# Patient Record
Sex: Male | Born: 1935
Health system: Southern US, Community
[De-identification: ages and names within clinical notes are randomized; demographics above are authoritative.]

## PROBLEM LIST (undated history)

## (undated) DIAGNOSIS — H409 Unspecified glaucoma: Secondary | ICD-10-CM

## (undated) DIAGNOSIS — K219 Gastro-esophageal reflux disease without esophagitis: Secondary | ICD-10-CM

## (undated) DIAGNOSIS — E039 Hypothyroidism, unspecified: Secondary | ICD-10-CM

## (undated) DIAGNOSIS — Z8719 Personal history of other diseases of the digestive system: Secondary | ICD-10-CM

## (undated) DIAGNOSIS — K210 Gastro-esophageal reflux disease with esophagitis: Secondary | ICD-10-CM

## (undated) DIAGNOSIS — C189 Malignant neoplasm of colon, unspecified: Secondary | ICD-10-CM

## (undated) DIAGNOSIS — F32A Depression, unspecified: Secondary | ICD-10-CM

## (undated) DIAGNOSIS — IMO0001 Reserved for inherently not codable concepts without codable children: Secondary | ICD-10-CM

## (undated) DIAGNOSIS — H9190 Unspecified hearing loss, unspecified ear: Secondary | ICD-10-CM

## (undated) DIAGNOSIS — I480 Paroxysmal atrial fibrillation: Secondary | ICD-10-CM

## (undated) DIAGNOSIS — E785 Hyperlipidemia, unspecified: Secondary | ICD-10-CM

## (undated) DIAGNOSIS — F329 Major depressive disorder, single episode, unspecified: Secondary | ICD-10-CM

## (undated) DIAGNOSIS — C801 Malignant (primary) neoplasm, unspecified: Secondary | ICD-10-CM

## (undated) DIAGNOSIS — C61 Malignant neoplasm of prostate: Secondary | ICD-10-CM

## (undated) DIAGNOSIS — F419 Anxiety disorder, unspecified: Secondary | ICD-10-CM

## (undated) DIAGNOSIS — R9439 Abnormal result of other cardiovascular function study: Secondary | ICD-10-CM

## (undated) DIAGNOSIS — I10 Essential (primary) hypertension: Secondary | ICD-10-CM

## (undated) DIAGNOSIS — I2510 Atherosclerotic heart disease of native coronary artery without angina pectoris: Secondary | ICD-10-CM

## (undated) DIAGNOSIS — D510 Vitamin B12 deficiency anemia due to intrinsic factor deficiency: Secondary | ICD-10-CM

## (undated) HISTORY — PX: PARTIAL COLECTOMY: SHX5273

## (undated) HISTORY — PX: COLON SURGERY: SHX602

## (undated) HISTORY — DX: Hyperlipidemia, unspecified: E78.5

## (undated) HISTORY — DX: Unspecified hearing loss, unspecified ear: H91.90

## (undated) HISTORY — DX: Malignant neoplasm of prostate: C61

## (undated) HISTORY — DX: Unspecified glaucoma: H40.9

## (undated) HISTORY — PX: CATARACT EXTRACTION: SUR2

## (undated) HISTORY — DX: Reserved for inherently not codable concepts without codable children: IMO0001

## (undated) HISTORY — PX: COLONOSCOPY: SHX174

## (undated) HISTORY — DX: Vitamin B12 deficiency anemia due to intrinsic factor deficiency: D51.0

## (undated) HISTORY — PX: EYE SURGERY: SHX253

## (undated) HISTORY — DX: Paroxysmal atrial fibrillation: I48.0

## (undated) HISTORY — DX: Gastro-esophageal reflux disease without esophagitis: K21.9

## (undated) HISTORY — PX: TONSILLECTOMY: SUR1361

## (undated) HISTORY — DX: Gastro-esophageal reflux disease with esophagitis: K21.0

## (undated) HISTORY — DX: Hypothyroidism, unspecified: E03.9

---

## 1989-05-22 HISTORY — PX: HERNIA REPAIR: SHX51

## 2005-10-01 ENCOUNTER — Emergency Department: Payer: Self-pay | Admitting: Emergency Medicine

## 2005-11-09 ENCOUNTER — Ambulatory Visit: Payer: Self-pay | Admitting: Gastroenterology

## 2007-05-23 HISTORY — PX: FOOT SURGERY: SHX648

## 2008-02-03 ENCOUNTER — Encounter: Admission: RE | Admit: 2008-02-03 | Discharge: 2008-02-03 | Payer: Self-pay | Admitting: Orthopedic Surgery

## 2008-02-04 ENCOUNTER — Encounter (INDEPENDENT_AMBULATORY_CARE_PROVIDER_SITE_OTHER): Payer: Self-pay | Admitting: Orthopedic Surgery

## 2008-02-04 ENCOUNTER — Ambulatory Visit (HOSPITAL_BASED_OUTPATIENT_CLINIC_OR_DEPARTMENT_OTHER): Admission: RE | Admit: 2008-02-04 | Discharge: 2008-02-04 | Payer: Self-pay | Admitting: Orthopedic Surgery

## 2008-02-10 ENCOUNTER — Ambulatory Visit: Payer: Self-pay | Admitting: Internal Medicine

## 2009-03-09 ENCOUNTER — Ambulatory Visit: Payer: Self-pay | Admitting: Gastroenterology

## 2010-10-04 NOTE — Op Note (Signed)
Jacob Reyes, Jacob Reyes              ACCOUNT NO.:  [id number]   MEDICAL RECORD NO.:  [id number]          PATIENT TYPE:  AMB   LOCATION:  DSC                          FACILITY:  [facility]   PHYSICIAN:  Leonides Grills, M.D.     DATE OF BIRTH:  26-Aug-1925   DATE OF PROCEDURE:  02/04/2008  DATE OF DISCHARGE:                               OPERATIVE REPORT   PREOPERATIVE DIAGNOSES:  1. Right tight gastrocnemius.  2. Right second metatarsalgia, secondary to Morton foot.  3. Right second hammertoe.   POSTOPERATIVE DIAGNOSES:  1. Right tight gastrocnemius.  2. Right second metatarsalgia, secondary to Morton's foot.  3. Right second hammertoe.   OPERATION:  1. Right gastrocnemius slide  2. Right second Weil metatarsal-shortening osteotomy.  3. Amputation, right second toe through metatarsophalangeal joint.   ANESTHESIA:  General.   SURGEON:  Leonides Grills, MD   ASSISTANT:  Richardean Canal, PA-C   ESTIMATED BLOOD LOSS:  Minimal.   TOURNIQUET TIME:  Approximately half an hour.   COMPLICATIONS:  None.   DISPOSITION:  Stable to PR.   INDICATIONS:  This is an 77 year old male who has had prolonged right  forefoot pain that was interfering with his life to the point what he  wants to  with a crossover second hammertoe deformity with an advanced  hallux valgus deformity.  Hallux valgus was not bothering him, but the  crossover second hammertoe was rubbing in his shoe and causing him  problems.  He also had a tight gastroc.  He was consented for the above  procedure.  All risks including infection or vessel injury, nonunion,  malunion, hardware irritation, hardware failure, persistent pain,  worsening pain, and prolonged recovery were all explained and questions  were encouraged and answered.   OPERATION:  The patient was brought to the operating room and placed in  supine position after adequate general endotracheal anesthesia was  administered as well as Ancef 1 g IV piggyback.  Right lower  extremity  was then prepped and draped in sterile manner.  Over proximally placed a  thigh tourniquet, limbus gravity was exsanguinated, it was elevated to  290 mmHg.  A longitudinal incision was made over the medial aspect of  gastrocnemius muscle tendinous junction was then made.  Dissection was  carried down through the skin.  Hemostasis was obtained.  Fascia was  opened in line with the incision.  Conjoined region was then developed  between the gastroc soleus.  Soft tissue was then elevated off the  posterior aspect of the gastrocnemius.  Sural nerve was identified and  exposed serially throughout the case.  Gastrocnemius was then released  with a curved Mayo scissors.  This had an excellent release of tight  gastroc.  The area was copiously irrigated with normal saline.  Subcu  was closed with 3-0 Vicryl and skin was closed 4-0 nylon, then a racquet-  shaped incision based dorsally was then made over at the base of the  right second toe.  Dissection was carried directly to bone.  The toe was  then carefully dissected out and disarticulated at the MTP joint.  Hemostasis was obtained.  We then using a stack and sagittal saw blade,  performed a Weil metatarsal-shortening osteotomy parallel to plantar  aspect of the foot.  This was then translated approximately 4 to 5-mm  proximally and then fixed with a 1.5-mm fully-threaded cortical set  screw using a 1.1-mm drill hole respectively.  This had an excellent  purchase and maintenance of the reduction.  We were able to visualize  underneath the metatarsal head and the screw did not penetrate bone  plantarly.  We then obtained x-rays of AP and lateral planes, which  showed no gross motion fixation, proposition, and excellent as well.  The tourniquet deflated and hemostasis was obtained.  There was no  pulsatile bleeding.  Subcu was closed with 3-0 Vicryl and skin was  closed with 4-0 nylon.  Sterile dressing was applied.  Cam walker boot   was applied.  The patient was stable to PR.      Leonides Grills, M.D.  Electronically Signed     PB/MEDQ  D:  02/04/2008  T:  02/05/2008  Job:  [id number]

## 2011-02-22 LAB — BASIC METABOLIC PANEL
CO2: 26
Calcium: 8.8
Creatinine, Ser: 0.82
GFR calc Af Amer: >60
GFR calc non Af Amer: >60
Glucose, Bld: 118 — ABNORMAL HIGH

## 2012-03-12 ENCOUNTER — Ambulatory Visit: Payer: Self-pay | Admitting: Gastroenterology

## 2013-07-10 ENCOUNTER — Encounter: Payer: Self-pay | Admitting: Podiatrist

## 2013-07-10 ENCOUNTER — Ambulatory Visit (INDEPENDENT_AMBULATORY_CARE_PROVIDER_SITE_OTHER): Payer: Medicare Other | Admitting: Podiatrist

## 2013-07-10 VITALS — BP 144/96 | HR 97 | Resp 18 | Ht 73.0 in | Wt 187.0 lb

## 2013-07-10 DIAGNOSIS — Q828 Other specified congenital malformations of skin: Secondary | ICD-10-CM

## 2013-07-10 DIAGNOSIS — M216X9 Other acquired deformities of unspecified foot: Secondary | ICD-10-CM

## 2013-07-10 NOTE — Progress Notes (Signed)
   Subjective: Jacey presents today for continued care of the callus submetatarsal 2 of the right foot. He states it has become painful with ambulation and in shoe gear. He continues to use his open toed support stockings and states that these have helped with the cramping he was experiencing in the past.  Objective: Patient is awake alert and oriented x3. Vascular exam reveals palpable pedal pulses at 2/4 DP and PT bilateral. Diffuse swelling is present left greater than right ankles. This is per his baseline. Neurological sensation is intact both epicriticaly and protectively bilateral. Musculoskeletal examination reveals significant hallux abductovalgus deformity bilateral. Previous amputation of the second toe on the right foot is noted. Amputation was do to an overlapping and painful digit. Prominent plantarflexed second metatarsal is present which causes pressure and a large porokeratotic lesion. Contracture deformity of all digits is present bilateral.  Assessment: Prominent plantarflexed second metatarsal right foot with associated porokeratotic lesion.  Plan: Debridement of the lesion was carried out at today's visit without complication. He will be seen back as needed for followup he is instructed on continued wear of his good orthotic insoles and supportive shoes. If any problems or concerns arise he is instructed to call.

## 2013-11-16 DIAGNOSIS — C189 Malignant neoplasm of colon, unspecified: Secondary | ICD-10-CM

## 2013-11-16 DIAGNOSIS — K2100 Gastro-esophageal reflux disease with esophagitis, without bleeding: Secondary | ICD-10-CM

## 2013-11-16 DIAGNOSIS — E785 Hyperlipidemia, unspecified: Secondary | ICD-10-CM

## 2013-11-16 DIAGNOSIS — C61 Malignant neoplasm of prostate: Secondary | ICD-10-CM

## 2013-11-16 HISTORY — DX: Gastro-esophageal reflux disease with esophagitis, without bleeding: K21.00

## 2013-11-16 HISTORY — DX: Hyperlipidemia, unspecified: E78.5

## 2013-11-16 HISTORY — DX: Malignant neoplasm of colon, unspecified: C18.9

## 2013-11-16 HISTORY — DX: Malignant neoplasm of prostate: C61

## 2014-01-29 ENCOUNTER — Ambulatory Visit (INDEPENDENT_AMBULATORY_CARE_PROVIDER_SITE_OTHER): Payer: Medicare Other | Admitting: Podiatry

## 2014-01-29 DIAGNOSIS — B351 Tinea unguium: Secondary | ICD-10-CM

## 2014-01-29 DIAGNOSIS — Q828 Other specified congenital malformations of skin: Secondary | ICD-10-CM

## 2014-01-29 DIAGNOSIS — M79673 Pain in unspecified foot: Secondary | ICD-10-CM

## 2014-01-29 DIAGNOSIS — M79609 Pain in unspecified limb: Secondary | ICD-10-CM

## 2014-01-29 NOTE — Progress Notes (Signed)
Patient left before being seen. Her appointment was at 10:45 and she had another appointment somewhere else at 11:30 that she needed to be at. She stated she was just here for a nail check and it was doing good. She did not want to make another appointment.

## 2014-01-30 NOTE — Progress Notes (Signed)
Patient ID: Jacob Reyes, male   DOB: 1925/09/05, 80 y.o.   MRN: [id number]  Subjective: Mr. Carriker returns the office they for continued care of submetatarsal 2 hyperkeratotic lesion on the right foot. States at this area is painful upon ambulation with shoe gear. Also states that his nails are symptomatic was shoes. States that he previously had an amputation of the second digit on the right foot do to significant bunion and overlapping second toe deformity. No other complaints at this time.  Objective: AAO x3, NAD DP/PT pulses palpable 2/4 b/l. CRT < 3sec Protective sensation Intact with Derrel Nip monofilament. Significant HAV bilaterally. Hammertoe contractures b/l.  Plantarflex second metatarsal on the right foot with underlying hyperkeratotic lesion. Previous second digit indication of the right foot Nails hypertrophic, dystrophic, elongated, discolored x9. No open lesions, pre-ulcerative lesions No leg pain, swelling, warmth.  Assessment: 80 year old male with symptomatic nail dystrophy, hyperkeratotic lesion right second metatarsal secondary to plantarflexed metatarsal.  Plan: -Various treatment options discussed including alternatives, risks, complications. -Hyperkeratotic lesion sharply debrided without complication -Nails sharply debrided x9 without complications. -Followup in 3 months or sooner if any problems are to arise. Call with any questions or concerns or change in symptoms.

## 2014-04-30 ENCOUNTER — Ambulatory Visit (INDEPENDENT_AMBULATORY_CARE_PROVIDER_SITE_OTHER): Payer: Medicare Other | Admitting: Podiatry

## 2014-04-30 DIAGNOSIS — B351 Tinea unguium: Secondary | ICD-10-CM

## 2014-04-30 DIAGNOSIS — M216X9 Other acquired deformities of unspecified foot: Secondary | ICD-10-CM

## 2014-04-30 DIAGNOSIS — M79673 Pain in unspecified foot: Secondary | ICD-10-CM

## 2014-04-30 DIAGNOSIS — Q828 Other specified congenital malformations of skin: Secondary | ICD-10-CM

## 2014-04-30 NOTE — Progress Notes (Signed)
Patient ID: Jacob Reyes, male   DOB: 16-Jan-1926, 80 y.o.   MRN: [id number]  Subjective: 80 year old male returns the office today for painful elongated toenails as well as hyperkeratotic lesion bilateral feet. He states that the lesions are painful particularly shoe gear and pressure. Nails are also painful with pressure. Denies any acute changes since last appointment. No other complaints at this time. Denies any systemic complaints as fevers, chills, nausea, vomiting.  Objective: AAO 3, NAD DP/PT pulses palpable bilaterally, CRT less than 3 seconds Protective sensation intact with Simms Weinstein monofilament Significant HAV deformity bilaterally with hammertoe contractures. Previous second digit amputation on the right foot. Plantar flexed second metatarsals with underlying hyperkeratotic lesion. Nails hypertrophic, dystrophic, elongated, brittle, discolored 9. No swelling erythema or drainage. No open lesions No pain with calf compression, swelling, warmth, erythema.  Assessment: 80 year old male with symptomatic onychomycosis, hyperkeratotic lesion submetatarsal 2.  Plan: -Treatment options were discussed including alternatives, risks, complications. -Nail sharply debrided 9 without complications. -Hyperkeratotic lesions 2 were debrided without, complications. -Discussed the importance of daily foot aspect. -Follow-up in 3 months or sooner if any problems are to arise. In the meantime, call the office with any questions, concerns, changes symptoms.

## 2014-05-25 DIAGNOSIS — E782 Mixed hyperlipidemia: Secondary | ICD-10-CM | POA: Insufficient documentation

## 2014-07-30 ENCOUNTER — Ambulatory Visit (INDEPENDENT_AMBULATORY_CARE_PROVIDER_SITE_OTHER): Payer: Medicare Other | Admitting: Podiatry

## 2014-07-30 DIAGNOSIS — M79673 Pain in unspecified foot: Secondary | ICD-10-CM

## 2014-07-30 DIAGNOSIS — B351 Tinea unguium: Secondary | ICD-10-CM

## 2014-07-30 DIAGNOSIS — Q828 Other specified congenital malformations of skin: Secondary | ICD-10-CM

## 2014-07-30 NOTE — Progress Notes (Signed)
Patient ID: RAMONTE MENA, male   DOB: 23-Sep-1925, 81 y.o.   MRN: [id number]  Subjective: 81 y.o.-year-old male returns the office today for painful, elongated, thickened toenails which he is unable to cut himself. Denies any redness or drainage around the nails. Also states he has painful calluses on the bottom of his feet which are painful with pressure. Denies any acute changes since last appointment and no new complaints today. Denies any systemic complaints such as fevers, chills, nausea, vomiting.   Objective: AAO 3, NAD DP/PT pulses palpable, CRT less than 3 seconds Chronic appearing edema to bilateral lower extremities.  Protective sensation intact with Derrel Nip monofilament, Achilles tendon reflex intact.  Nails hypertrophic, dystrophic, elongated, brittle, discolored 9. There is tenderness overlying these nails 1-5 bilaterally with the exception of the right 2nd toe as it has previously been amputated. There is no surrounding erythema or drainage along the nail sites. Annular hyperkerotic lesions bilateral submetatarsal 2 with right > left. Upon debridement, no underlying ulceration, drainage, or clinical signs of infection.  No open lesions or other pre-ulcerative lesions are identified. No other areas of tenderness bilateral lower extremities. Significant HAV deformity bilaterally with hammertoe contracture. No overlying, erythema, increased warmth. No pain with calf compression, warmth, erythema.  Assessment: Patient presents with symptomatic onychomycosis x9, porokeratosis x 2  Plan: -Treatment options including alternatives, risks, complications were discussed -Nails sharply debrided 9 without complication/bleeding. -Hyperkerotic lesion sharply debrided x 2 without complications.  -Discussed daily foot inspection. If there are any changes, to call the office immediately.  -Follow-up in 3 months or sooner if any problems are to arise. In the meantime, encouraged to  call the office with any questions, concerns, changes symptoms.

## 2014-10-29 ENCOUNTER — Ambulatory Visit (INDEPENDENT_AMBULATORY_CARE_PROVIDER_SITE_OTHER): Payer: Medicare Other | Admitting: Podiatry

## 2014-10-29 DIAGNOSIS — B351 Tinea unguium: Secondary | ICD-10-CM | POA: Diagnosis not present

## 2014-10-29 DIAGNOSIS — M79673 Pain in unspecified foot: Secondary | ICD-10-CM

## 2014-10-29 DIAGNOSIS — Q828 Other specified congenital malformations of skin: Secondary | ICD-10-CM

## 2014-10-29 NOTE — Progress Notes (Signed)
Patient ID: BLONG BUSK, male   DOB: [birth year], 81 y.o.   MRN: [id number]  Subjective: 81 y.o.-year-old male returns the office today for painful, elongated, thickened toenails which he is unable to cut himself. Denies any redness or drainage around the nails. Also states he has painful calluses on the bottom of his feet which are painful with pressure. Denies any acute changes since last appointment and no new complaints today. Denies any systemic complaints such as fevers, chills, nausea, vomiting.   Objective: AAO 3, NAD DP/PT pulses palpable, CRT less than 3 seconds Chronic appearing edema to bilateral lower extremities.  Protective sensation intact with Derrel Nip monofilament, Achilles tendon reflex intact.  Nails hypertrophic, dystrophic, elongated, brittle, discolored 9. There is tenderness overlying these nails 1-5 bilaterally with the exception of the right 2nd toe as it has previously been amputated. There is no surrounding erythema or drainage along the nail sites. Hyperkerotic lesions bilateral submetatarsal 2 with right > left. Upon debridement, no underlying ulceration, drainage, or clinical signs of infection.  No open lesions or other pre-ulcerative lesions are identified. No other areas of tenderness bilateral lower extremities. Significant HAV deformity bilaterally with hammertoe contracture. No overlying, erythema, increased warmth. No pain with calf compression, warmth, erythema.  Assessment: Patient presents with symptomatic onychomycosis x9, porokeratosis R sub 2.  Plan: -Treatment options including alternatives, risks, complications were discussed -Nails sharply debrided 9 without complication/bleeding. -Hyperkerotic lesion sharply debrided  without complications.  -Discussed daily foot inspection. If there are any changes, to call the office immediately.  -Follow-up in 3 months or sooner if any problems are to arise. In the meantime, encouraged to call the  office with any questions, concerns, changes symptoms.

## 2014-12-09 ENCOUNTER — Other Ambulatory Visit: Payer: Self-pay | Admitting: Cardiology

## 2014-12-10 ENCOUNTER — Ambulatory Visit
Admission: RE | Admit: 2014-12-10 | Discharge: 2014-12-10 | Disposition: A | Discharge status: Home / Self Care | Payer: Medicare Other | Source: Ambulatory Visit | Attending: Cardiology | Admitting: Cardiology

## 2014-12-10 ENCOUNTER — Encounter: Payer: Self-pay | Admitting: *Deleted

## 2014-12-10 ENCOUNTER — Other Ambulatory Visit: Payer: Self-pay | Admitting: Cardiology

## 2014-12-10 ENCOUNTER — Encounter
Admission: RE | Disposition: A | Discharge status: Home / Self Care | Payer: Self-pay | Source: Ambulatory Visit | Attending: Cardiology

## 2014-12-10 DIAGNOSIS — Z8249 Family history of ischemic heart disease and other diseases of the circulatory system: Secondary | ICD-10-CM | POA: Diagnosis not present

## 2014-12-10 DIAGNOSIS — Z800 Family history of malignant neoplasm of digestive organs: Secondary | ICD-10-CM | POA: Diagnosis not present

## 2014-12-10 DIAGNOSIS — I2510 Atherosclerotic heart disease of native coronary artery without angina pectoris: Secondary | ICD-10-CM | POA: Insufficient documentation

## 2014-12-10 DIAGNOSIS — I259 Chronic ischemic heart disease, unspecified: Secondary | ICD-10-CM | POA: Diagnosis not present

## 2014-12-10 DIAGNOSIS — Z7982 Long term (current) use of aspirin: Secondary | ICD-10-CM | POA: Diagnosis not present

## 2014-12-10 DIAGNOSIS — Z9889 Other specified postprocedural states: Secondary | ICD-10-CM | POA: Diagnosis not present

## 2014-12-10 DIAGNOSIS — E782 Mixed hyperlipidemia: Secondary | ICD-10-CM | POA: Diagnosis not present

## 2014-12-10 DIAGNOSIS — Z8546 Personal history of malignant neoplasm of prostate: Secondary | ICD-10-CM | POA: Insufficient documentation

## 2014-12-10 DIAGNOSIS — I209 Angina pectoris, unspecified: Secondary | ICD-10-CM

## 2014-12-10 DIAGNOSIS — I208 Other forms of angina pectoris: Secondary | ICD-10-CM

## 2014-12-10 DIAGNOSIS — Z79899 Other long term (current) drug therapy: Secondary | ICD-10-CM | POA: Diagnosis not present

## 2014-12-10 DIAGNOSIS — K210 Gastro-esophageal reflux disease with esophagitis: Secondary | ICD-10-CM | POA: Insufficient documentation

## 2014-12-10 DIAGNOSIS — I25119 Atherosclerotic heart disease of native coronary artery with unspecified angina pectoris: Secondary | ICD-10-CM | POA: Diagnosis present

## 2014-12-10 DIAGNOSIS — R079 Chest pain, unspecified: Secondary | ICD-10-CM | POA: Insufficient documentation

## 2014-12-10 DIAGNOSIS — H409 Unspecified glaucoma: Secondary | ICD-10-CM | POA: Insufficient documentation

## 2014-12-10 DIAGNOSIS — Z9049 Acquired absence of other specified parts of digestive tract: Secondary | ICD-10-CM | POA: Diagnosis not present

## 2014-12-10 DIAGNOSIS — Z818 Family history of other mental and behavioral disorders: Secondary | ICD-10-CM | POA: Diagnosis not present

## 2014-12-10 DIAGNOSIS — Z85038 Personal history of other malignant neoplasm of large intestine: Secondary | ICD-10-CM | POA: Insufficient documentation

## 2014-12-10 DIAGNOSIS — E785 Hyperlipidemia, unspecified: Secondary | ICD-10-CM | POA: Diagnosis not present

## 2014-12-10 DIAGNOSIS — I25118 Atherosclerotic heart disease of native coronary artery with other forms of angina pectoris: Secondary | ICD-10-CM

## 2014-12-10 DIAGNOSIS — R5383 Other fatigue: Secondary | ICD-10-CM | POA: Insufficient documentation

## 2014-12-10 HISTORY — DX: Malignant neoplasm of colon, unspecified: C18.9

## 2014-12-10 HISTORY — DX: Atherosclerotic heart disease of native coronary artery without angina pectoris: I25.10

## 2014-12-10 HISTORY — DX: Malignant (primary) neoplasm, unspecified: C80.1

## 2014-12-10 HISTORY — PX: CARDIAC CATHETERIZATION: SHX172

## 2014-12-10 HISTORY — DX: Gastro-esophageal reflux disease without esophagitis: K21.9

## 2014-12-10 SURGERY — LEFT HEART CATH AND CORONARY ANGIOGRAPHY

## 2014-12-10 MED ORDER — SODIUM CHLORIDE 0.9 % IJ SOLN: 3.0000 mL | INTRAMUSCULAR | Status: DC | PRN

## 2014-12-10 MED ORDER — FENTANYL CITRATE (PF) 100 MCG/2ML IJ SOLN
INTRAMUSCULAR | Status: AC
  Filled 2014-12-10: qty 2

## 2014-12-10 MED ORDER — METOPROLOL TARTRATE 25 MG PO TABS: 25.0000 mg | ORAL_TABLET | Freq: Two times a day (BID) | ORAL | Status: DC

## 2014-12-10 MED ORDER — SODIUM CHLORIDE 0.9 % IJ SOLN: 3.0000 mL | Freq: Two times a day (BID) | INTRAMUSCULAR | Status: DC

## 2014-12-10 MED ORDER — MIDAZOLAM HCL 2 MG/2ML IJ SOLN
INTRAMUSCULAR | Status: AC
  Filled 2014-12-10: qty 2

## 2014-12-10 MED ORDER — SODIUM CHLORIDE 0.9 % WEIGHT BASED INFUSION: 3.0000 mL/kg/h | INTRAVENOUS | Status: DC

## 2014-12-10 MED ORDER — FENTANYL CITRATE (PF) 100 MCG/2ML IJ SOLN
INTRAMUSCULAR | Status: DC | PRN
  Administered 2014-12-10: 50 ug via INTRAVENOUS

## 2014-12-10 MED ORDER — METOPROLOL TARTRATE 25 MG PO TABS
25.0000 mg | ORAL_TABLET | Freq: Once | ORAL | Status: AC
  Administered 2014-12-10: 25 mg via ORAL
  Filled 2014-12-10: qty 1

## 2014-12-10 MED ORDER — SODIUM CHLORIDE 0.9 % IV SOLN: 250.0000 mL | INTRAVENOUS | Status: DC | PRN

## 2014-12-10 MED ORDER — IOHEXOL 300 MG/ML  SOLN
INTRAMUSCULAR | Status: DC | PRN
  Administered 2014-12-10: 70 mL via INTRA_ARTERIAL
  Administered 2014-12-10: 30 mL via INTRA_ARTERIAL

## 2014-12-10 MED ORDER — MIDAZOLAM HCL 2 MG/2ML IJ SOLN
INTRAMUSCULAR | Status: DC | PRN
  Administered 2014-12-10: 1 mg via INTRAVENOUS

## 2014-12-10 MED ORDER — SODIUM CHLORIDE 0.9 % WEIGHT BASED INFUSION: 1.0000 mL/kg/h | INTRAVENOUS | Status: DC

## 2014-12-10 MED ORDER — HEPARIN (PORCINE) IN NACL 2-0.9 UNIT/ML-% IJ SOLN
INTRAMUSCULAR | Status: AC
  Filled 2014-12-10: qty 1000

## 2014-12-10 MED ORDER — ASPIRIN 81 MG PO CHEW: 81.0000 mg | CHEWABLE_TABLET | ORAL | Status: DC

## 2014-12-10 MED ORDER — LIDOCAINE HCL (PF) 1 % IJ SOLN
INTRAMUSCULAR | Status: DC | PRN
  Administered 2014-12-10: 7 mL via SUBCUTANEOUS

## 2014-12-10 SURGICAL SUPPLY — 9 items
CATH INFINITI 5FR ANG PIGTAIL (CATHETERS) ×3 IMPLANT
CATH INFINITI 5FR JL4 (CATHETERS) ×3 IMPLANT
CATH INFINITI JR4 5F (CATHETERS) ×3 IMPLANT
DEVICE CLOSURE MYNXGRIP 5F (Vascular Products) ×3 IMPLANT
KIT MANI 3VAL PERCEP (MISCELLANEOUS) ×3 IMPLANT
NEEDLE PERC 18GX7CM (NEEDLE) ×3 IMPLANT
PACK CARDIAC CATH (CUSTOM PROCEDURE TRAY) ×3 IMPLANT
SHEATH AVANTI 5FR X 11CM (SHEATH) ×3 IMPLANT
WIRE EMERALD 3MM-J .035X150CM (WIRE) ×3 IMPLANT

## 2014-12-10 NOTE — Discharge Summary (Signed)
  3 vessel cad will require cabg. EF normal

## 2014-12-10 NOTE — H&P (Signed)
Chief Complaint: Chief Complaint  Patient presents with  . Follow-up  new pt per Dr Sabra Heck had a stress echo that showed a blockage per Dr Sabra Heck  . Chest Pain  had a little on the right side of chest felt like gas pains  . Fatigue  I have low energy  Date of Service: 11/30/2014 Date of Birth: 1926-04-19 PCP: Rusty Aus, MD  History of Present Illness: [name] is a 81 y.o.male patient who presents in referral by Dr. Rusty Aus for evaluation of a patient with an exercise stress echo that was read as showing inferior posterior ischemia. Patient has episodic pain on the right side of his chest. It feels like gas pains. He also has low energy and fatigue. He has no prior cardiac history. He is status post partial colectomy due to colon cancer. He has been treated with aspirin on lovastatin. He is relatively active despite his advanced age. He denies syncope presyncope orthopnea or PND.  Past Medical and Surgical History  Past Medical History Past Medical History  Diagnosis Date  . Hyperlipidemia 11/16/2013  . Reflux esophagitis 11/16/2013  . Prostate cancer 11/16/2013  1/8 bx pos, waiting  . Colon cancer 11/16/2013  Partial colectomy  . Glaucoma (increased eye pressure)  Bilateral   Past Surgical History He has past surgical history that includes Colectomy Partial W/Anastamosis and Hernia repair (Bilateral).   Medications and Allergies  Current Medications  Current Outpatient Prescriptions  Medication Sig Dispense Refill  . *calcium carbonate/cholecalciferol/hydroxocobalamin/fa/vit b6 oral 2 tab by mouth daily (Patient taking differently: Take 1 tablet by mouth once daily. )  . aspirin 81 mg tablet 1 tab by mouth daily (Patient taking differently: Take 1 tablet by mouth every other day. )  . bimatoprost (LUMIGAN) 0.03 % ophthalmic solution 1 drop in both eyes daily  . finasteride (PROSCAR) 5 mg tablet Take 1 tablet (5 mg total) by mouth once daily. 90 tablet 3  . lovastatin  (MEVACOR) 40 MG tablet Take 1 tablet (40 mg total) by mouth daily with dinner. 90 tablet 3  . MAGNESIUM ORAL Take 1 tablet by mouth once daily.  . melatonin 5 mg Tab Take 1 tablet by mouth nightly.  Marland Kitchen omeprazole (PRILOSEC) 20 MG DR capsule Take 1 capsule (20 mg total) by mouth once daily. 90 capsule 3  . polyethylene glycol (MIRALAX) powder Take 17 g by mouth once daily. 255 g 11  . tamsulosin (FLOMAX) 0.4 mg capsule Take 1 capsule (0.4 mg total) by mouth 2 (two) times daily. (Patient taking differently: Take 0.4 mg by mouth once daily. ) 180 capsule 3  . COMBIGAN 0.2-0.5 % ophthalmic solution Place 1 drop into both eyes 2 (two) times daily.   No current facility-administered medications for this visit.   Allergies: Review of patient's allergies indicates no known allergies.  Social and Family History  Social History reports that he has never smoked. He has never used smokeless tobacco. He reports that he does not drink alcohol or use illicit drugs.  Family History Family History  Problem Relation Age of Onset  . Coronary artery disease Mother  . Heart attack Mother  . Coronary artery disease Father  . Heart attack Father  . Coronary artery disease Brother  . Heart attack Brother  . Pancreatic cancer Sister  . Alzheimer's disease Sister   Review of Systems  Review of Systems  Constitutional: Positive for malaise/fatigue. Negative for fever, chills, weight loss and diaphoresis.  HENT: Negative for congestion,  ear discharge, hearing loss and tinnitus.  Eyes: Negative for blurred vision.  Respiratory: Negative for cough, hemoptysis, sputum production, shortness of breath and wheezing.  Cardiovascular: Positive for chest pain. Negative for palpitations, orthopnea, claudication, leg swelling and PND.  Gastrointestinal: Negative for heartburn, nausea, vomiting, abdominal pain, diarrhea, constipation, blood in stool and melena.  Genitourinary: Negative for dysuria, urgency, frequency and  hematuria.  Musculoskeletal: Negative for myalgias, back pain, joint pain and falls.  Skin: Negative for itching and rash.  Neurological: Negative for dizziness, tingling, focal weakness, loss of consciousness, weakness and headaches.  Endo/Heme/Allergies: Negative for polydipsia. Does not bruise/bleed easily.  Psychiatric/Behavioral: Negative for depression, memory loss and substance abuse. The patient is not nervous/anxious.    Physical Examination   Vitals:BP 132/84 mmHg  Pulse 80  Resp 10  Ht 190.5 cm (6\' 3" )  Wt 87.091 kg (192 lb)  BMI 24.00 kg/m2 Ht:190.5 cm (6\' 3" ) Wt:87.091 kg (192 lb) DTO:IZTI surface area is 2.15 meters squared. Body mass index is 24 kg/(m^2).  Wt Readings from Last 3 Encounters:  11/30/14 87.091 kg (192 lb)  11/19/14 86.637 kg (191 lb)  05/25/14 87.544 kg (193 lb)   BP Readings from Last 3 Encounters:  11/30/14 132/84  11/19/14 138/72  05/25/14 160/100   General appearance appears in no acute distress  Head Mouth and Eye exam Normocephalic, without obvious abnormality, atraumatic Dentition is good Eyes appear anicteric   Neck exam Thyroid: normal  Nodes: no obvious adenopathy  LUNGS Breath Sounds: Normal Percussion: Normal  CARDIOVASCULAR JVP CV wave: no HJR: no Elevation at 90 degrees: None Carotid Pulse: normal pulsation bilaterally Bruit: None Apex: apical impulse normal  Auscultation Rhythm: normal sinus rhythm S1: normal S2: normal Clicks: no Rub: no Murmurs: no murmurs  Gallop: None ABDOMEN Liver enlargement: no Pulsatile aorta: no Ascites: no Bruits: no  EXTREMITIES Clubbing: no Edema: 1+ bilateral pedal edema Pulses: peripheral pulses symmetrical Femoral Bruits: no Amputation: no SKIN Rash: no Cyanosis: no Embolic phemonenon: no Bruising: no NEURO Alert and Oriented to person, place and time: yes Non focal: yes  PSYCH: Pt appears to have normal affect  LABS REVIEWED Last 3 CBC results: Lab  Results  Component Value Date  WBC 6.4 11/12/2014  WBC 6.8 11/10/2013   Lab Results  Component Value Date  HGB 16.3 11/12/2014  HGB 16.1 11/10/2013   Lab Results  Component Value Date  HCT 47.6 11/12/2014   Lab Results  Component Value Date  PLT 155 11/12/2014   Lab Results  Component Value Date  CREATININE 0.9 11/12/2014  BUN 13 11/12/2014  NA 138 11/12/2014  K 4.1 11/12/2014  CL 103 11/12/2014  CO2 29.9 11/12/2014   No results found for: HGBA1C  Lab Results  Component Value Date  HDL 36.3 11/12/2014  HDL 41.2 05/25/2014   Lab Results  Component Value Date  LDLCALC 119 11/12/2014  LDLCALC 130 05/25/2014   Lab Results  Component Value Date  TRIG 108 11/12/2014  TRIG 88 05/25/2014   Lab Results  Component Value Date  ALT 12 11/12/2014  AST 16 11/12/2014  ALKPHOS 71 11/12/2014   Lab Results  Component Value Date  TSH 5.648* 11/12/2014   Diagnostic Studies Reviewed:  EKG EKG demonstrated normal sinus rhythm, nonspecific ST and T waves changes.  Assessment and Plan   81 y.o. male with  ICD-10-CM ICD-9-CM  1. Combined hyperlipidemia-continue with lovastatin E78.2 272.2  2. Chest pain-patient underwent exercise stress echo felt to show inferior posterior ischemia. Patient has  both typical atypical chest pain. Discussed risk and benefits of left cardiac catheterization with the patient his family. Will proceed with left heart catheterization to evaluate coronary anatomy to guide further therapy in a patient with chest pain and abnormal stress echo. Further recommendations after cardiac catheterization is completed.  No Follow-up on file.  These notes generated with voice recognition software. I apologize for typographical errors.  Sydnee Levans, MD

## 2014-12-10 NOTE — Discharge Summary (Signed)
  3 vessel cad with preserved lv funciton. Will discuss with cardiac surgery regarding possible cabg.

## 2014-12-10 NOTE — Discharge Instructions (Signed)
Angiogram °An angiogram, also called angiography, is a procedure used to look at the blood vessels that carry blood to different parts of your body (arteries). In this procedure, dye is injected through a long, thin tube (catheter) into an artery. X-rays are then taken. The X-rays will show if there is a blockage or problem in a blood vessel.  °LET YOUR HEALTH CARE PROVIDER KNOW ABOUT: °· Any allergies you have, including allergies to shellfish or contrast dye.   °· All medicines you are taking, including vitamins, herbs, eye drops, creams, and over-the-counter medicines.   °· Previous problems you or members of your family have had with the use of anesthetics.   °· Any blood disorders you have.   °· Previous surgeries you have had. °· Any previous kidney problems or failure you have had.  °· Medical conditions you have.   °· Possibility of pregnancy, if this applies. °RISKS AND COMPLICATIONS °Generally, an angiogram is a safe procedure. However, as with any procedure, problems can occur. Possible problems include: °· Injury to the blood vessels, including rupture or bleeding. °· Infection or bruising at the catheter site. °· Allergic reaction to the dye or contrast used. °· Kidney damage from the dye or contrast used. °· Blood clots that can lead to a stroke or heart attack. °BEFORE THE PROCEDURE °· Do not eat or drink after midnight on the night before the procedure, or as directed by your health care provider.   °· Ask your health care provider if you may drink enough water to take any needed medicines the morning of the procedure.   °PROCEDURE °· You may be given a medicine to help you relax (sedative) before and during the procedure. This medicine is given through an IV access tube that is inserted into one of your veins.   °· The area where the catheter will be inserted will be washed and shaved. This is usually done in the groin but may be done in the fold of your arm (near your elbow) or in the wrist. °· A  medicine will be given to numb the area where the catheter will be inserted (local anesthetic). °· The catheter will be inserted with a guide wire into an artery. The catheter is guided by using a type of X-ray (fluoroscopy) to the blood vessel being examined.   °· Dye is then injected into the catheter, and X-rays are taken. The dye helps to show where any narrowing or blockages are located.   °AFTER THE PROCEDURE  °· If the procedure is done through the leg, you will be kept in bed lying flat for several hours. You will be instructed to not bend or cross your legs. °· The insertion site will be checked frequently. °· The pulse in your feet or wrist will be checked frequently. °· Additional blood tests, X-rays, and electrocardiography may be done.   °· You may need to stay in the hospital overnight for observation.   °Document Released: 02/15/2005 Document Revised: 05/13/2013 Document Reviewed: 10/09/2012 °[employer]® Patient Information ©2015 [employer]. This information is not intended to replace advice given to you by your health care provider. Make sure you discuss any questions you have with your health care provider. ° °

## 2014-12-18 ENCOUNTER — Encounter: Payer: Medicare Other | Admitting: Cardiothoracic Surgery

## 2014-12-24 ENCOUNTER — Encounter: Payer: Self-pay | Admitting: Cardiothoracic Surgery

## 2014-12-24 ENCOUNTER — Institutional Professional Consult (permissible substitution) (INDEPENDENT_AMBULATORY_CARE_PROVIDER_SITE_OTHER): Payer: Medicare Other | Admitting: Cardiothoracic Surgery

## 2014-12-24 VITALS — BP 161/92 | HR 60 | Resp 16 | Ht 73.0 in | Wt 192.0 lb

## 2014-12-24 DIAGNOSIS — I2510 Atherosclerotic heart disease of native coronary artery without angina pectoris: Secondary | ICD-10-CM

## 2014-12-24 NOTE — Patient Instructions (Signed)
Coronary Artery Bypass Grafting Coronary artery bypass grafting (CABG) is a procedure done to bypass or fix arteries of the heart (coronary arteries) that have become narrow or blocked. This narrowing is usually the result of plaque that has built up in the walls of the vessels. The coronary arteries supply the heart with the oxygen and nutrients it needs to pump blood to your body. In the CABG procedure, a section of blood vessel from another part of the body (usually the leg, arm, or chest wall) is removed and then inserted where it will allow blood to bypass the damaged part of the coronary artery.  LET [facility] CARE PROVIDER KNOW ABOUT:  Any allergies you have.   All medicines you are taking, including blood thinners, vitamins, herbs, eye drops, creams, and over-the-counter medicines.   Use of steroids (by mouth or creams).   Previous problems you or members of your family have had with the use of anesthetics.   Any blood disorders you have.   Previous surgeries you have had.   Medical conditions you have.  RISKS AND COMPLICATIONS Generally, this is a safe procedure. However, problems can occur and include:   Blood loss.   Stroke.   Infection.   Pain at the surgical site.   Heart attack during or after surgery.   Kidney failure.  BEFORE THE PROCEDURE  Take medicines only as directed by your health care provider. You may be asked to start new medicines and stop taking others. Do not stop medicines or adjust dosages on your own.  Do not eat or drink anything after midnight the night before the procedure or as directed by your health care provider. Ask your health care provider if it is okay to take a sip of water with any needed medicines. PROCEDURE The surgeon may use either an open technique or a minimally invasive technique for this surgery. Traditional open surgery  You will be given medicine to make you sleep through the procedure (general  anesthetic).  Once you are asleep, a cut (incision) will be made down the front of the chest through the breastbone (sternum). The sternum will be spread open so your heart can be seen.  You will then be placed on a heart-lung bypass machine. This machine will provide oxygen to your blood while the heart is undergoing surgery.  Your heart will then be temporarily stopped so that the surgeon can do the next steps.  A section of vein will likely be taken from your leg and used to bypass the blocked arteries of your heart. Sometimes parts of an artery from inside your chest wall or from your arm will be used, either alone or in combination with leg veins.  When the bypasses are done, you will be taken off the machine.  Your heart will be restarted and will take over again normally.  The sac around the heart will be closed.  Your chest will then be closed with stitches or staples.  Tubes will remain in your chest and will be connected to a suction device in order to help drain fluid and reinflate the lungs. Minimally invasive surgery This technique is done from an incision over your left chest area. If appropriate, your surgeon may not have to slow or stop your heart. If your condition allows for this procedure, there will often be less blood loss, less pain, a shorter hospital stay, and faster recovery compared to traditional open surgery. AFTER THE PROCEDURE  You will be taken to  a recovery area to be monitored.  You may wake up with a tube in your throat to help your breathing. You may be connected to a breathing machine. You will not be able to talk while the tube is in place. The tube will be taken out as soon as it is safe to do so.  You will be groggy and may have some pain. You will be given pain medicine to help control the pain. Document Released: 02/15/2005 Document Revised: 09/22/2013 Document Reviewed: 10/15/2012 [facility] Patient Information 2015 [place], [place]. This  information is not intended to replace advice given to you by your health care provider. Make sure you discuss any questions you have with your health care provider. Coronary Artery Bypass Grafting, Care After Refer to this sheet in the next few weeks. These instructions provide you with information on caring for yourself after your procedure. Your health care provider may also give you more specific instructions. Your treatment has been planned according to current medical practices, but problems sometimes occur. Call your health care provider if you have any problems or questions after your procedure. WHAT TO EXPECT AFTER THE PROCEDURE Recovery from surgery will be different for everyone. Some people feel well after 3 or 4 weeks, while for others it takes longer. After your procedure, it is typical to have the following:  Nausea and a lack of appetite.   Constipation.  Weakness and fatigue.   Depression or irritability.   Pain or discomfort at your incision site. HOME CARE INSTRUCTIONS  Take medicines only as directed by your health care provider. Do not stop taking medicines or start any new medicines without first checking with your health care provider.  Take your pulse as directed by your health care provider.  Perform deep breathing as directed by your health care provider. If you were given a device called an incentive spirometer, use it to practice deep breathing several times a day. Support your chest with a pillow or your arms when you take deep breaths or cough.  Keep incision areas clean, dry, and protected. Remove or change any bandages (dressings) only as directed by your health care provider. You may have skin adhesive strips over the incision areas. Do not take the strips off. They will fall off on their own.  Check incision areas daily for any swelling, redness, or drainage.  If incisions were made in your legs, do the following:  Avoid crossing your legs.   Avoid  sitting for long periods of time. Change positions every 30 minutes.   Elevate your legs when you are sitting.  Wear compression stockings as directed by your health care provider. These stockings help keep blood clots from forming in your legs.  Take showers once your health care provider approves. Until then, only take sponge baths. Pat incisions dry. Do not rub incisions with a washcloth or towel. Do not take baths, swim, or use a hot tub until your health care provider approves.  Eat foods that are high in fiber, such as raw fruits and vegetables, whole grains, beans, and nuts. Meats should be lean cut. Avoid canned, processed, and fried foods.  Drink enough fluid to keep your urine clear or pale yellow.  Weigh yourself every day. This helps identify if you are retaining fluid that may make your heart and lungs work harder.  Rest and limit activity as directed by your health care provider. You may be instructed to:  Stop any activity at once if you have chest  pain, shortness of breath, irregular heartbeats, or dizziness. Get help right away if you have any of these symptoms.  Move around frequently for short periods or take short walks as directed by your health care provider. Increase your activities gradually. You may need physical therapy or cardiac rehabilitation to help strengthen your muscles and build your endurance.  Avoid lifting, pushing, or pulling anything heavier than 10 lb (4.5 kg) for at least 6 weeks after surgery.  Do not drive until your health care provider approves.  Ask your health care provider when you may return to work.  Ask your health care provider when you may resume sexual activity.  Keep all follow-up visits as directed by your health care provider. This is important. SEEK MEDICAL CARE IF:  You have swelling, redness, increasing pain, or drainage at the site of an incision.  You have a fever.  You have swelling in your ankles or legs.  You have  pain in your legs.   You gain 2 or more pounds (0.9 kg) a day.  You are nauseous or vomit.  You have diarrhea. SEEK IMMEDIATE MEDICAL CARE IF:  You have chest pain that goes to your jaw or arms.  You have shortness of breath.   You have a fast or irregular heartbeat.   You notice a "clicking" in your breastbone (sternum) when you move.   You have numbness or weakness in your arms or legs.  You feel dizzy or light-headed.  MAKE SURE YOU:  Understand these instructions.  Will watch your condition.  Will get help right away if you are not doing well or get worse. Document Released: 11/25/2004 Document Revised: 09/22/2013 Document Reviewed: 10/15/2012 [facility] Patient Information 2015 [place], [place]. This information is not intended to replace advice given to you by your health care provider. Make sure you discuss any questions you have with your health care provider.

## 2014-12-24 NOTE — Progress Notes (Signed)
[address]       [place] [address]             [phone]                    [name] [facility] Medical Record #[id number] Date of Birth: [birth year]  Referring: Teodoro Spray, MD Primary Care: Rusty Aus., MD  Chief Complaint:    Chief Complaint  Patient presents with  . Coronary Artery Disease    eval for surgery    History of Present Illness:    Jacob Reyes 81 y.o. male is seen in the office  today in referral from  Dr. Emily Filbert and Dr Kyra Searles for evaluation of exercise stress echo that was read as showing inferior posterior ischemia and recent cardiac cath. Patient has episodic pain on the right side of his chest. It feels like gas pains. He also has low energy and fatigue. He notes "indig[facility] .He has no prior cardiac history. He is status post partial colectomy due to colon cancer unknown stage. He has been treated with aspirin on lovastatin.  He denies syncope presyncope orthopnea or PND.    Current Activity/ Functional Status:  Patient is independent with mobility/ambulation, transfers, ADL's, IADL's.   Zubrod Score: At the time of surgery this patient's most appropriate activity status/level should be described as: []     0    Normal activity, no symptoms [x]     1    Restricted in physical strenuous activity but ambulatory, able to do out light work []     2    Ambulatory and capable of self care, unable to do work activities, up and about               >50 % of waking hours                              []     3    Only limited self care, in bed greater than 50% of waking hours []     4    Completely disabled, no self care, confined to bed or chair []     5    Moribund   Past Medical History  Diagnosis Date  . Glaucoma   . Hearing loss   . Reflux   . GERD (gastroesophageal reflux disease)   . Cancer     prostate  . Colon cancer   . Coronary artery disease      Past Surgical History  Procedure Laterality Date  . Colon surgery    . Cardiac catheterization N/A 12/10/2014    Procedure: Left Heart Cath and Coronary Angiography;  Surgeon: Teodoro Spray, MD;  Location: [facility];  Service: Cardiovascular;  Laterality: N/A;    No family history on file.  History   Social History  . Marital Status: Married    Spouse Name: N/A  . Number of Children: N/A  . Years of Education: N/A   Occupational History  . Not on file.   Social History Main Topics  . Smoking status: Never Smoker   . Smokeless tobacco: Never Used  . Alcohol Use: No  . Drug Use: No  . Sexual Activity: Not on file   Other Topics Concern  . Not on file   Social History Narrative  History  Smoking status  . Never Smoker   Smokeless tobacco  . Never Used    History  Alcohol Use No     No Known Allergies  Current Outpatient Prescriptions  Medication Sig Dispense Refill  . dorzolamide-timolol (COSOPT) 22.3-6.8 MG/ML ophthalmic solution     . finasteride (PROSCAR) 5 MG tablet     . lovastatin (MEVACOR) 40 MG tablet     . LUMIGAN 0.01 % SOLN     . metoprolol tartrate (LOPRESSOR) 25 MG tablet Take 1 tablet (25 mg total) by mouth 2 (two) times daily. 60 tablet 3  . omeprazole (PRILOSEC) 20 MG capsule     . polyethylene glycol powder (GLYCOLAX/MIRALAX) powder     . tamsulosin (FLOMAX) 0.4 MG CAPS capsule      No current facility-administered medications for this visit.      Review of Systems:     Cardiac Review of Systems: Y or N  Chest Pain [  y  ]  Resting SOB [  n ] Exertional SOB  [ y ]  Orthopnea [  n]   Pedal Edema [ y left >right  ]    Palpitations [ n ] Syncope  [  ]   Presyncope [  n ]  General Review of Systems: [Y] = yes [  ]=no Constitional: recent weight change [n  ];  Wt loss over the last 3 months [   ] anorexia [  ]; fatigue [  ]; nausea [  ]; night sweats [  ]; fever [n  ]; or chills [  ];          Dental: poor dentition[ n ];  Last Dentist visit:   Eye : blurred vision [  ]; diplopia [   ]; vision changes [  ];  Amaurosis fugax[  ]; Resp: cough [  ];  wheezing[n  ];  hemoptysis[n  ]; shortness of breath[n  ]; paroxysmal nocturnal dyspnea[ n ]; dyspnea on exertion[ y ]; or orthopnea[  ];  GI:  gallstones[  ], vomiting[  ];  dysphagia[  ]; melena[  ];  hematochezia [  ]; heartburn[  ];   Hx of  Colonoscopy[ 2013 ]; GU: kidney stones [  ]; hematuria[  ];   dysuria [  ];  nocturia[  ];  history of     obstruction [  ]; urinary frequency [  ]             Skin: rash, swelling[  ];, hair loss[  ];  peripheral edema[  ];  or itching[  ]; Musculosketetal: myalgias[  ];  joint swelling[  ];  joint erythema[  ];  joint pain[  ];  back pain[  ];  Heme/Lymph: bruising[  ];  bleeding[  ];  anemia[  ];  Neuro: TIA[ n ];  headaches[ n ];  stroke[ n ];  vertigo[ n ];  seizures[  n];   paresthesias[  n];  difficulty walking[n  ];  Psych:depression[  ]; anxiety[  ];  Endocrine: diabetes[n  ];  thyroid dysfunction[  ];  Immunizations: Flu up to date Blue.Reese  ]; Pneumococcal up to date [ y ];  Other:  Physical Exam: BP 161/92 mmHg  Pulse 60  Resp 16  Ht 6\' 1"  (1.854 m)  Wt 192 lb (87.091 kg)  BMI 25.34 kg/m2  SpO2 98%  PHYSICAL EXAMINATION: General appearance: alert, cooperative, appears younger than stated age and no distress Head: Normocephalic, without obvious abnormality, atraumatic  Neck: no adenopathy, no carotid bruit, no JVD, supple, symmetrical, trachea midline and thyroid not enlarged, symmetric, no tenderness/mass/nodules Lymph nodes: Cervical, supraclavicular, and axillary nodes normal. Resp: clear to auscultation bilaterally Back: symmetric, no curvature. ROM normal. No CVA tenderness. Cardio: regular rate and rhythm, S1, S2 normal, no murmur, click, rub or gallop GI: soft, non-tender; bowel sounds normal; no masses,  no organomegaly Extremities: extremities normal, atraumatic, no cyanosis  and Homans sign is negative,  no sign of DVT, both ankles are arthritic the left leg below the knee is more swollen than the right. The right second toe is surgically amputated. It's difficult to palpate pedal pulses due to the edema. We'll obtain vein mapping prior to surgery, but currently the right leg looks preferable for use for bypass. Neurologic: Grossly normal  Diagnostic Studies & Laboratory data:   Cath: [place]- Dr Kyra Searles Conclusion     LM lesion, 40% stenosed.  Ost Ramus lesion, 70% stenosed.  Prox Cx lesion, 80% stenosed.  Mid LAD lesion, 80% stenosed.  Prox RCA lesion, 100% stenosed.  1st Diag lesion, 70% stenosed.  Prox LAD lesion, 70% stenosed.     Coronary Findings    Dominance: Right   Left Main   . LM lesion, 30% stenosed.   . LM lesion, 40% stenosed.     Left Anterior Descending   . Prox LAD lesion, 70% stenosed.   . Mid LAD lesion, 80% stenosed.   . First Diagonal Branch   . 1st Diag lesion, 70% stenosed.     Ramus Intermedius   . Ost Ramus lesion, 70% stenosed.     Left Circumflex   . Prox Cx lesion, 80% stenosed.     Right Coronary Artery   . Prox RCA lesion, 100% stenosed.   . Right Posterior Descending Artery   RPDA filled by collaterals from Dist LAD.           I have independently reviewed the above  cath films and reviewed the findings with the  patient .    Stress echo : mild MR,AR,TR,PR no as or ms evidence of inferior ischemia   Recent Radiology Findings:   No results found.     Recent Lab Findings: Lab Results  Component Value Date   HGB 15.8 POINT OF CARE RESULT 02/04/2008   GLUCOSE 118* 02/03/2008   NA 138 02/03/2008   K 4.5 02/03/2008   CL 104 02/03/2008   CREATININE 0.82 02/03/2008   BUN 14 02/03/2008   CO2 26 02/03/2008      Assessment / Plan:   Symtopatic 80 yo male with out significant comomorbities with the exception of age with 3 vessel cad. With his physiologic age less then his chronologic age I recommended proceeding  with coronary artery bypass grafting. We discussed other treatment options include medical which could improve symptoms but with 3 vessel disease is unlikely to prolong his life or improve his functional status. Anatomically angioplasty would not be suitable. The risks and options of surgery discussed with he has wife and family in detail and he is willing to proceed. We'll make arrangements for preoperative laboratory testing August 5 and proceed with surgery on August 8.      I  spent 45 minutes counseling the patient face to face and 50% or more the  time was spent in counseling and coordination of care. The total time spent in the appointment was 60 minutes.  [name] MD      [address] [place] [address] Office [phone]   [place]  12/24/2014 5:34 PM

## 2014-12-25 ENCOUNTER — Encounter (HOSPITAL_COMMUNITY): Payer: Self-pay

## 2014-12-25 ENCOUNTER — Encounter (HOSPITAL_COMMUNITY)
Admission: RE | Admit: 2014-12-25 | Discharge: 2014-12-25 | Disposition: A | Discharge status: Home / Self Care | Payer: Medicare Other | Source: Ambulatory Visit | Attending: Cardiothoracic Surgery | Admitting: Cardiothoracic Surgery

## 2014-12-25 ENCOUNTER — Encounter: Payer: Medicare Other | Admitting: Cardiothoracic Surgery

## 2014-12-25 ENCOUNTER — Ambulatory Visit (HOSPITAL_COMMUNITY)
Admission: RE | Admit: 2014-12-25 | Discharge: 2014-12-25 | Disposition: A | Discharge status: Home / Self Care | Payer: Medicare Other | Source: Ambulatory Visit | Attending: Cardiothoracic Surgery | Admitting: Cardiothoracic Surgery

## 2014-12-25 ENCOUNTER — Ambulatory Visit (HOSPITAL_BASED_OUTPATIENT_CLINIC_OR_DEPARTMENT_OTHER)
Admission: RE | Admit: 2014-12-25 | Discharge: 2014-12-25 | Disposition: A | Discharge status: Home / Self Care | Payer: Medicare Other | Source: Ambulatory Visit | Attending: Cardiothoracic Surgery | Admitting: Cardiothoracic Surgery

## 2014-12-25 ENCOUNTER — Other Ambulatory Visit: Payer: Self-pay | Admitting: *Deleted

## 2014-12-25 VITALS — BP 122/62 | HR 59 | Temp 97.8°F | Resp 18 | Ht 73.0 in | Wt 191.8 lb

## 2014-12-25 DIAGNOSIS — I6523 Occlusion and stenosis of bilateral carotid arteries: Secondary | ICD-10-CM

## 2014-12-25 DIAGNOSIS — I25111 Atherosclerotic heart disease of native coronary artery with angina pectoris with documented spasm: Secondary | ICD-10-CM

## 2014-12-25 DIAGNOSIS — Z0183 Encounter for blood typing: Secondary | ICD-10-CM

## 2014-12-25 DIAGNOSIS — I440 Atrioventricular block, first degree: Secondary | ICD-10-CM

## 2014-12-25 DIAGNOSIS — Z01812 Encounter for preprocedural laboratory examination: Secondary | ICD-10-CM | POA: Insufficient documentation

## 2014-12-25 DIAGNOSIS — Z01818 Encounter for other preprocedural examination: Secondary | ICD-10-CM

## 2014-12-25 DIAGNOSIS — Z85038 Personal history of other malignant neoplasm of large intestine: Secondary | ICD-10-CM | POA: Insufficient documentation

## 2014-12-25 DIAGNOSIS — I2510 Atherosclerotic heart disease of native coronary artery without angina pectoris: Secondary | ICD-10-CM

## 2014-12-25 HISTORY — DX: Anxiety disorder, unspecified: F41.9

## 2014-12-25 HISTORY — DX: Depression, unspecified: F32.A

## 2014-12-25 HISTORY — DX: Personal history of other diseases of the digestive system: Z87.19

## 2014-12-25 HISTORY — DX: Major depressive disorder, single episode, unspecified: F32.9

## 2014-12-25 HISTORY — DX: Abnormal result of other cardiovascular function study: R94.39

## 2014-12-25 HISTORY — DX: Essential (primary) hypertension: I10

## 2014-12-25 LAB — COMPREHENSIVE METABOLIC PANEL
ALT: 14 U/L — ABNORMAL LOW (ref 17–63)
AST: 20 U/L (ref 15–41)
Albumin: 3.7 g/dL (ref 3.5–5.0)
Alkaline Phosphatase: 79 U/L (ref 38–126)
Anion gap: 8 (ref 5–15)
BUN: 15 mg/dL (ref 6–20)
CO2: 22 mmol/L (ref 22–32)
Calcium: 8.7 mg/dL — ABNORMAL LOW (ref 8.9–10.3)
Chloride: 96 mmol/L — ABNORMAL LOW (ref 101–111)
Creatinine, Ser: 0.74 mg/dL (ref 0.61–1.24)
GFR calc Af Amer: >60 mL/min (ref >60)
GFR calc non Af Amer: >60 mL/min (ref >60)
Glucose, Bld: 91 mg/dL (ref 65–99)
Potassium: 4.7 mmol/L (ref 3.5–5.1)
Sodium: 126 mmol/L — ABNORMAL LOW (ref 135–145)
Total Bilirubin: 1.1 mg/dL (ref 0.3–1.2)
Total Protein: 6.2 g/dL — ABNORMAL LOW (ref 6.5–8.1)

## 2014-12-25 LAB — BLOOD GAS, ARTERIAL
Acid-base deficit: 0.2 mmol/L (ref 0.0–2.0)
Bicarbonate: 23.6 mEq/L (ref 20.0–24.0)
Drawn by: 42180
FIO2: 0.21
O2 Saturation: 98 %
Patient temperature: 98.6
TCO2: 24.7 mmol/L (ref 0–100)
pCO2 arterial: 36.4 mmHg (ref 35.0–45.0)
pH, Arterial: 7.428 (ref 7.350–7.450)
pO2, Arterial: 113 mmHg — ABNORMAL HIGH (ref 80.0–100.0)

## 2014-12-25 LAB — PULMONARY FUNCTION TEST
DL/VA % pred: 91 %
DL/VA: 4.29 ml/min/mmHg/L
DLCO unc % pred: 46 %
DLCO unc: 16.14 ml/min/mmHg
FEF 25-75 Pre: 2.78 L/sec
FEF2575-%Pred-Pre: 155 %
FEV1-%Pred-Pre: 88 %
FEV1-Pre: 2.49 L
FEV1FVC-%Pred-Pre: 113 %
FEV6-%Pred-Pre: 77 %
FEV6-Pre: 2.91 L
FEV6FVC-%Pred-Pre: 107 %
FVC-%Pred-Pre: 77 %
FVC-Pre: 3.12 L
Pre FEV1/FVC ratio: 80 %
Pre FEV6/FVC Ratio: 100 %
RV % pred: 135 %
RV: 3.97 L
TLC % pred: 98 %
TLC: 7.36 L

## 2014-12-25 LAB — URINALYSIS, ROUTINE W REFLEX MICROSCOPIC
Bilirubin Urine: NEGATIVE
Glucose, UA: NEGATIVE mg/dL
Hgb urine dipstick: NEGATIVE
Ketones, ur: NEGATIVE mg/dL
Leukocytes, UA: NEGATIVE
Nitrite: NEGATIVE
Protein, ur: NEGATIVE mg/dL
Specific Gravity, Urine: 1.016 (ref 1.005–1.030)
Urobilinogen, UA: 1 mg/dL (ref 0.0–1.0)
pH: 7 (ref 5.0–8.0)

## 2014-12-25 LAB — CBC
HCT: 45.7 % (ref 39.0–52.0)
Hemoglobin: 16.1 g/dL (ref 13.0–17.0)
MCH: 32.4 pg (ref 26.0–34.0)
MCHC: 35.2 g/dL (ref 30.0–36.0)
MCV: 92 fL (ref 78.0–100.0)
Platelets: 164 10*3/uL (ref 150–400)
RBC: 4.97 MIL/uL (ref 4.22–5.81)
RDW: 13.5 % (ref 11.5–15.5)
WBC: 10.7 10*3/uL — ABNORMAL HIGH (ref 4.0–10.5)

## 2014-12-25 LAB — SURGICAL PCR SCREEN
MRSA, PCR: NEGATIVE
Staphylococcus aureus: NEGATIVE

## 2014-12-25 LAB — PROTIME-INR
INR: 1.21 (ref 0.00–1.49)
Prothrombin Time: 15.5 seconds — ABNORMAL HIGH (ref 11.6–15.2)

## 2014-12-25 LAB — ABO/RH: ABO/RH(D): B POS

## 2014-12-25 LAB — APTT: aPTT: 25 seconds (ref 24–37)

## 2014-12-25 MED ORDER — ALBUTEROL SULFATE (2.5 MG/3ML) 0.083% IN NEBU
2.5000 mg | INHALATION_SOLUTION | Freq: Once | RESPIRATORY_TRACT | Status: DC
Start: 2014-12-25 — End: 2014-12-25

## 2014-12-25 MED ORDER — ALBUTEROL SULFATE (2.5 MG/3ML) 0.083% IN NEBU: 2.5000 mg | INHALATION_SOLUTION | Freq: Once | RESPIRATORY_TRACT | Status: DC

## 2014-12-25 NOTE — Pre-Procedure Instructions (Signed)
JAVIEL CANEPA  12/25/2014      [facility] - [place], [place] - [address] [place] [address] Phone: [phone] Fax: [phone]    Your procedure is scheduled on 12/28/2014  Report to [facility] Admitting at 6:00 A.M.  Call this number if you have problems the morning of surgery:  [phone]   Remember:  Do not eat food or drink liquids after midnight.  Take these medicines the morning of surgery with A SIP OF WATER:  Metoprolol, Omeprazole     Do not wear jewelry   Do not wear lotions, powders, or perfumes.  You may wear deodorant.     Men may shave face and neck.   Do not bring valuables to the hospital.   [facility] is not responsible for any belongings or valuables.  Contacts, dentures or bridgework may not be worn into surgery.  Leave your suitcase in the car.  After surgery it may be brought to your room.  For patients admitted to the hospital, discharge time will be determined by your treatment team.  Patients discharged the day of surgery will not be allowed to drive home.   Name and phone number of your driver:   family Special instructions:  Special Instructions: [place] - Preparing for Surgery  Before surgery, you can play an important role.  Because skin is not sterile, your skin needs to be as free of germs as possible.  You can reduce the number of germs on you skin by washing with CHG (chlorahexidine gluconate) soap before surgery.  CHG is an antiseptic cleaner which kills germs and bonds with the skin to continue killing germs even after washing.  Please DO NOT use if you have an allergy to CHG or antibacterial soaps.  If your skin becomes reddened/irritated stop using the CHG and inform your nurse when you arrive at Short Stay.  Do not shave (including legs and underarms) for at least 48 hours prior to the first CHG shower.  You may shave your  face.  Please follow these instructions carefully:   1.  Shower with CHG Soap the night before surgery and the  morning of Surgery.  2.  If you choose to wash your hair, wash your hair first as usual with your  normal shampoo.  3.  After you shampoo, rinse your hair and body thoroughly to remove the  Shampoo.  4.  Use CHG as you would any other liquid soap.  You can apply chg directly to the skin and wash gently with scrungie or a clean washcloth.  5.  Apply the CHG Soap to your body ONLY FROM THE NECK DOWN.    Do not use on open wounds or open sores.  Avoid contact with your eyes, ears, mouth and genitals (private parts).  Wash genitals (private parts)   with your normal soap.  6.  Wash thoroughly, paying special attention to the area where your surgery will be performed.  7.  Thoroughly rinse your body with warm water from the neck down.  8.  DO NOT shower/wash with your normal soap after using and rinsing off   the CHG Soap.  9.  Pat yourself dry with a clean towel.            10.  Wear clean pajamas.            11.  Place clean sheets on your bed the night of your first shower and do not sleep with pets.  Day of Surgery  Do not apply any lotions/deodorants the morning of surgery.  Please wear clean clothes to the hospital/surgery center.  Please read over the following fact sheets that you were given. Pain Booklet, Coughing and Deep Breathing, Blood Transfusion Information, Open Heart Packet, MRSA Information and Surgical Site Infection Prevention

## 2014-12-25 NOTE — Progress Notes (Signed)
Anesthesia Chart Review:  Pt is 81 year old male scheduled for CABG on 12/28/2014 with Dr. Servando Snare.   Please see Dr. Everrett Coombe note in [place] dated 12/24/14 for details.   Preoperative labs reviewed.  Na is 126. Notified Jolene in Dr. Everrett Coombe office. If case proceeds as scheduled, pt will need repeat BMET DOS.   Willeen Cass, FNP-BC [facility] Short Stay Surgical Center/Anesthesiology Phone: [phone] 12/25/2014 4:49 PM

## 2014-12-25 NOTE — Progress Notes (Signed)
Call to [place]. , reported sodium on CMET done earlier today.

## 2014-12-25 NOTE — Progress Notes (Signed)
VASCULAR LAB PRELIMINARY  PRELIMINARY  PRELIMINARY  PRELIMINARY  Pre-op Cardiac Surgery  Carotid Findings:  Bilateral:  1-39% ICA stenosis.  Vertebral artery flow is antegrade.     Upper Extremity Right Left  Brachial Pressures 151 Triphasic 148 Triphasic  Radial Waveforms Triphasic Triphasic  Ulnar Waveforms Triphasic Triphasic  Palmar Arch (Allen's Test) Normal Normal   Findings:  Doppler waveforms remained normal bilaterally with both radial and ulnar compressions    Lower  Extremity Right Left  Dorsalis Pedis 167 Triphasic 173 Triphasic  Posterior Tibial 150 Biphasic 171 Triphasic  Ankle/Brachial Indices 1.11 1.15    Findings:  ABIs and Doppler waveforms are within normal limits bilaterally at rest   Archit Leger, RVS 12/25/2014, 2:31 PM

## 2014-12-25 NOTE — Progress Notes (Signed)
VASCULAR LAB PRELIMINARY  PRELIMINARY  PRELIMINARY  PRELIMINARY  Right Lower Extremity Vein Map    Right Great Saphenous Vein   Segment Diameter Comment  1. Origin 4.79mm   2. High Thigh 3.62mm   3. Mid Thigh 3.60mm   4. Low Thigh 2.20mm Branch and wall thickening  5. At Knee 2.51mm   6. High Calf 2.27mm Fluid and wall thickening  7. Low Calf 1.31mm Fluid and wall thickening  8. Ankle 3.2mm Branches -wall thickening     Left Lower Extremity Vein Map    Left Great Saphenous Vein   Segment Diameter Comment  1. Origin 5.76mm   2. High Thigh 2.63mm   3. Mid Thigh 3.68mm   4. Low Thigh 2.96mm Branch  5. At Knee 2.52mm   6. High Calf 2.29mm   7. Low Calf 2.61mm Wall thickening  8. Ankle mm Unable to identify due to fluid         Jef Futch, RVS 12/25/2014, 2:35 PM

## 2014-12-26 LAB — HEMOGLOBIN A1C
Hgb A1c MFr Bld: 5.7 % — ABNORMAL HIGH (ref 4.8–5.6)
Mean Plasma Glucose: 117 mg/dL

## 2014-12-27 MED ORDER — SODIUM CHLORIDE 0.9 % IV SOLN
INTRAVENOUS | Status: AC
  Administered 2014-12-28: 69.8 mL/h via INTRAVENOUS
  Filled 2014-12-27: qty 40

## 2014-12-27 MED ORDER — EPINEPHRINE HCL 1 MG/ML IJ SOLN
0.0000 ug/min | INTRAVENOUS | Status: AC
  Administered 2014-12-28: 10 ug/min via INTRAVENOUS
  Filled 2014-12-27: qty 4

## 2014-12-27 MED ORDER — VANCOMYCIN HCL 10 G IV SOLR
1500.0000 mg | INTRAVENOUS | Status: AC
  Administered 2014-12-28: 1500 mg via INTRAVENOUS
  Filled 2014-12-27: qty 1500

## 2014-12-27 MED ORDER — CEFUROXIME SODIUM 1.5 G IJ SOLR
1.5000 g | INTRAMUSCULAR | Status: AC
  Administered 2014-12-28: .75 g via INTRAVENOUS
  Administered 2014-12-28: 1.5 g via INTRAVENOUS
  Filled 2014-12-27: qty 1.5

## 2014-12-27 MED ORDER — METOPROLOL TARTRATE 12.5 MG HALF TABLET: 12.5000 mg | ORAL_TABLET | ORAL | Status: DC

## 2014-12-27 MED ORDER — MAGNESIUM SULFATE 50 % IJ SOLN
40.0000 meq | INTRAMUSCULAR | Status: DC
  Filled 2014-12-27: qty 10

## 2014-12-27 MED ORDER — NITROGLYCERIN IN D5W 200-5 MCG/ML-% IV SOLN
2.0000 ug/min | INTRAVENOUS | Status: AC
  Administered 2014-12-28: 5 ug/min via INTRAVENOUS
  Filled 2014-12-27: qty 250

## 2014-12-27 MED ORDER — SODIUM CHLORIDE 0.9 % IV SOLN
INTRAVENOUS | Status: AC
  Administered 2014-12-28: 1 [IU]/h via INTRAVENOUS
  Filled 2014-12-27: qty 2.5

## 2014-12-27 MED ORDER — POTASSIUM CHLORIDE 2 MEQ/ML IV SOLN
80.0000 meq | INTRAVENOUS | Status: DC
  Filled 2014-12-27: qty 40

## 2014-12-27 MED ORDER — PHENYLEPHRINE HCL 10 MG/ML IJ SOLN
30.0000 ug/min | INTRAVENOUS | Status: AC
  Administered 2014-12-28: 10 ug/min via INTRAVENOUS
  Filled 2014-12-27: qty 2

## 2014-12-27 MED ORDER — PLASMA-LYTE 148 IV SOLN
INTRAVENOUS | Status: AC
  Administered 2014-12-28: 500 mL
  Filled 2014-12-27: qty 2.5

## 2014-12-27 MED ORDER — DOPAMINE-DEXTROSE 3.2-5 MG/ML-% IV SOLN
0.0000 ug/kg/min | INTRAVENOUS | Status: DC
  Filled 2014-12-27: qty 250

## 2014-12-27 MED ORDER — DEXMEDETOMIDINE HCL IN NACL 400 MCG/100ML IV SOLN
0.1000 ug/kg/h | INTRAVENOUS | Status: AC
  Administered 2014-12-28: .2 ug/kg/h via INTRAVENOUS
  Filled 2014-12-27: qty 100

## 2014-12-27 MED ORDER — SODIUM CHLORIDE 0.9 % IV SOLN
INTRAVENOUS | Status: DC
  Filled 2014-12-27: qty 30

## 2014-12-27 MED ORDER — DEXTROSE 5 % IV SOLN
750.0000 mg | INTRAVENOUS | Status: DC
  Filled 2014-12-27: qty 750

## 2014-12-28 ENCOUNTER — Inpatient Hospital Stay (HOSPITAL_COMMUNITY): Payer: Medicare Other | Admitting: Emergency Medicine

## 2014-12-28 ENCOUNTER — Inpatient Hospital Stay (HOSPITAL_COMMUNITY)
Admission: RE | Admit: 2014-12-28 | Discharge: 2015-01-11 | DRG: 236 | Disposition: A | Discharge status: Skilled Nursing Facility | Payer: Medicare Other | Source: Ambulatory Visit | Attending: Cardiothoracic Surgery | Admitting: Cardiothoracic Surgery

## 2014-12-28 ENCOUNTER — Encounter (HOSPITAL_COMMUNITY)
Admission: RE | Disposition: A | Discharge status: Skilled Nursing Facility | Payer: Medicare Other | Source: Ambulatory Visit | Attending: Cardiothoracic Surgery

## 2014-12-28 ENCOUNTER — Encounter (HOSPITAL_COMMUNITY): Payer: Self-pay | Admitting: *Deleted

## 2014-12-28 ENCOUNTER — Inpatient Hospital Stay (HOSPITAL_COMMUNITY): Payer: Medicare Other

## 2014-12-28 ENCOUNTER — Inpatient Hospital Stay (HOSPITAL_COMMUNITY): Payer: Medicare Other | Admitting: Anesthesiology

## 2014-12-28 DIAGNOSIS — I4892 Unspecified atrial flutter: Secondary | ICD-10-CM | POA: Diagnosis not present

## 2014-12-28 DIAGNOSIS — E785 Hyperlipidemia, unspecified: Secondary | ICD-10-CM | POA: Diagnosis present

## 2014-12-28 DIAGNOSIS — Z951 Presence of aortocoronary bypass graft: Secondary | ICD-10-CM

## 2014-12-28 DIAGNOSIS — I10 Essential (primary) hypertension: Secondary | ICD-10-CM | POA: Diagnosis present

## 2014-12-28 DIAGNOSIS — D62 Acute posthemorrhagic anemia: Secondary | ICD-10-CM | POA: Diagnosis not present

## 2014-12-28 DIAGNOSIS — I493 Ventricular premature depolarization: Secondary | ICD-10-CM | POA: Diagnosis not present

## 2014-12-28 DIAGNOSIS — Z7901 Long term (current) use of anticoagulants: Secondary | ICD-10-CM

## 2014-12-28 DIAGNOSIS — M79661 Pain in right lower leg: Secondary | ICD-10-CM | POA: Diagnosis not present

## 2014-12-28 DIAGNOSIS — K219 Gastro-esophageal reflux disease without esophagitis: Secondary | ICD-10-CM | POA: Diagnosis present

## 2014-12-28 DIAGNOSIS — Z0183 Encounter for blood typing: Secondary | ICD-10-CM | POA: Diagnosis not present

## 2014-12-28 DIAGNOSIS — Z7982 Long term (current) use of aspirin: Secondary | ICD-10-CM | POA: Diagnosis not present

## 2014-12-28 DIAGNOSIS — R7309 Other abnormal glucose: Secondary | ICD-10-CM | POA: Diagnosis present

## 2014-12-28 DIAGNOSIS — K5900 Constipation, unspecified: Secondary | ICD-10-CM | POA: Diagnosis not present

## 2014-12-28 DIAGNOSIS — I4891 Unspecified atrial fibrillation: Secondary | ICD-10-CM | POA: Diagnosis not present

## 2014-12-28 DIAGNOSIS — R079 Chest pain, unspecified: Secondary | ICD-10-CM | POA: Diagnosis present

## 2014-12-28 DIAGNOSIS — R5381 Other malaise: Secondary | ICD-10-CM | POA: Diagnosis not present

## 2014-12-28 DIAGNOSIS — Z9049 Acquired absence of other specified parts of digestive tract: Secondary | ICD-10-CM | POA: Diagnosis present

## 2014-12-28 DIAGNOSIS — I25110 Atherosclerotic heart disease of native coronary artery with unstable angina pectoris: Secondary | ICD-10-CM

## 2014-12-28 DIAGNOSIS — Z8546 Personal history of malignant neoplasm of prostate: Secondary | ICD-10-CM

## 2014-12-28 DIAGNOSIS — Z85038 Personal history of other malignant neoplasm of large intestine: Secondary | ICD-10-CM | POA: Diagnosis not present

## 2014-12-28 DIAGNOSIS — H409 Unspecified glaucoma: Secondary | ICD-10-CM | POA: Diagnosis present

## 2014-12-28 DIAGNOSIS — D696 Thrombocytopenia, unspecified: Secondary | ICD-10-CM | POA: Diagnosis not present

## 2014-12-28 DIAGNOSIS — I25111 Atherosclerotic heart disease of native coronary artery with angina pectoris with documented spasm: Secondary | ICD-10-CM

## 2014-12-28 DIAGNOSIS — E876 Hypokalemia: Secondary | ICD-10-CM | POA: Diagnosis not present

## 2014-12-28 DIAGNOSIS — H9190 Unspecified hearing loss, unspecified ear: Secondary | ICD-10-CM | POA: Diagnosis present

## 2014-12-28 DIAGNOSIS — E8770 Fluid overload, unspecified: Secondary | ICD-10-CM | POA: Diagnosis not present

## 2014-12-28 DIAGNOSIS — Z01812 Encounter for preprocedural laboratory examination: Secondary | ICD-10-CM

## 2014-12-28 DIAGNOSIS — D689 Coagulation defect, unspecified: Secondary | ICD-10-CM | POA: Diagnosis not present

## 2014-12-28 DIAGNOSIS — I509 Heart failure, unspecified: Secondary | ICD-10-CM | POA: Diagnosis not present

## 2014-12-28 DIAGNOSIS — F329 Major depressive disorder, single episode, unspecified: Secondary | ICD-10-CM | POA: Diagnosis present

## 2014-12-28 DIAGNOSIS — F419 Anxiety disorder, unspecified: Secondary | ICD-10-CM | POA: Diagnosis present

## 2014-12-28 DIAGNOSIS — C189 Malignant neoplasm of colon, unspecified: Secondary | ICD-10-CM | POA: Diagnosis not present

## 2014-12-28 DIAGNOSIS — Z01818 Encounter for other preprocedural examination: Secondary | ICD-10-CM

## 2014-12-28 DIAGNOSIS — I9581 Postprocedural hypotension: Secondary | ICD-10-CM | POA: Diagnosis not present

## 2014-12-28 DIAGNOSIS — I480 Paroxysmal atrial fibrillation: Secondary | ICD-10-CM | POA: Diagnosis not present

## 2014-12-28 DIAGNOSIS — I9589 Other hypotension: Secondary | ICD-10-CM | POA: Diagnosis not present

## 2014-12-28 DIAGNOSIS — I2510 Atherosclerotic heart disease of native coronary artery without angina pectoris: Secondary | ICD-10-CM | POA: Diagnosis present

## 2014-12-28 DIAGNOSIS — L03115 Cellulitis of right lower limb: Secondary | ICD-10-CM | POA: Diagnosis not present

## 2014-12-28 DIAGNOSIS — I959 Hypotension, unspecified: Secondary | ICD-10-CM | POA: Diagnosis not present

## 2014-12-28 HISTORY — PX: TEE WITHOUT CARDIOVERSION: SHX5443

## 2014-12-28 HISTORY — PX: CORONARY ARTERY BYPASS GRAFT: SHX141

## 2014-12-28 LAB — POCT I-STAT, CHEM 8
BUN: 11 mg/dL (ref 6–20)
BUN: 11 mg/dL (ref 6–20)
BUN: 9 mg/dL (ref 6–20)
BUN: 9 mg/dL (ref 6–20)
BUN: 8 mg/dL (ref 6–20)
BUN: 8 mg/dL (ref 6–20)
BUN: 9 mg/dL (ref 6–20)
Calcium, Ion: 1.16 mmol/L (ref 1.13–1.30)
Calcium, Ion: 1.16 mmol/L (ref 1.13–1.30)
Calcium, Ion: 0.96 mmol/L — ABNORMAL LOW (ref 1.13–1.30)
Calcium, Ion: 0.93 mmol/L — ABNORMAL LOW (ref 1.13–1.30)
Calcium, Ion: 0.84 mmol/L — ABNORMAL LOW (ref 1.13–1.30)
Calcium, Ion: 0.9 mmol/L — ABNORMAL LOW (ref 1.13–1.30)
Calcium, Ion: 1.1 mmol/L — ABNORMAL LOW (ref 1.13–1.30)
Chloride: 96 mmol/L — ABNORMAL LOW (ref 101–111)
Chloride: 95 mmol/L — ABNORMAL LOW (ref 101–111)
Chloride: 90 mmol/L — ABNORMAL LOW (ref 101–111)
Chloride: 93 mmol/L — ABNORMAL LOW (ref 101–111)
Chloride: 93 mmol/L — ABNORMAL LOW (ref 101–111)
Chloride: 95 mmol/L — ABNORMAL LOW (ref 101–111)
Chloride: 98 mmol/L — ABNORMAL LOW (ref 101–111)
Creatinine, Ser: 0.6 mg/dL — ABNORMAL LOW (ref 0.61–1.24)
Creatinine, Ser: 0.5 mg/dL — ABNORMAL LOW (ref 0.61–1.24)
Creatinine, Ser: 0.5 mg/dL — ABNORMAL LOW (ref 0.61–1.24)
Creatinine, Ser: 0.4 mg/dL — ABNORMAL LOW (ref 0.61–1.24)
Creatinine, Ser: 0.4 mg/dL — ABNORMAL LOW (ref 0.61–1.24)
Creatinine, Ser: 0.4 mg/dL — ABNORMAL LOW (ref 0.61–1.24)
Creatinine, Ser: 0.6 mg/dL — ABNORMAL LOW (ref 0.61–1.24)
Glucose, Bld: 94 mg/dL (ref 65–99)
Glucose, Bld: 110 mg/dL — ABNORMAL HIGH (ref 65–99)
Glucose, Bld: 91 mg/dL (ref 65–99)
Glucose, Bld: 113 mg/dL — ABNORMAL HIGH (ref 65–99)
Glucose, Bld: 139 mg/dL — ABNORMAL HIGH (ref 65–99)
Glucose, Bld: 160 mg/dL — ABNORMAL HIGH (ref 65–99)
Glucose, Bld: 162 mg/dL — ABNORMAL HIGH (ref 65–99)
HCT: 44 % (ref 39.0–52.0)
HCT: 41 % (ref 39.0–52.0)
HCT: 30 % — ABNORMAL LOW (ref 39.0–52.0)
HCT: 26 % — ABNORMAL LOW (ref 39.0–52.0)
HCT: 23 % — ABNORMAL LOW (ref 39.0–52.0)
HCT: 25 % — ABNORMAL LOW (ref 39.0–52.0)
HCT: 20 % — ABNORMAL LOW (ref 39.0–52.0)
Hemoglobin: 15 g/dL (ref 13.0–17.0)
Hemoglobin: 13.9 g/dL (ref 13.0–17.0)
Hemoglobin: 10.2 g/dL — ABNORMAL LOW (ref 13.0–17.0)
Hemoglobin: 8.8 g/dL — ABNORMAL LOW (ref 13.0–17.0)
Hemoglobin: 7.8 g/dL — ABNORMAL LOW (ref 13.0–17.0)
Hemoglobin: 8.5 g/dL — ABNORMAL LOW (ref 13.0–17.0)
Hemoglobin: 6.8 g/dL — CL (ref 13.0–17.0)
Potassium: 3.9 mmol/L (ref 3.5–5.1)
Potassium: 4.1 mmol/L (ref 3.5–5.1)
Potassium: 3.6 mmol/L (ref 3.5–5.1)
Potassium: 4.3 mmol/L (ref 3.5–5.1)
Potassium: 3.5 mmol/L (ref 3.5–5.1)
Potassium: 4 mmol/L (ref 3.5–5.1)
Potassium: 3.3 mmol/L — ABNORMAL LOW (ref 3.5–5.1)
Sodium: 133 mmol/L — ABNORMAL LOW (ref 135–145)
Sodium: 131 mmol/L — ABNORMAL LOW (ref 135–145)
Sodium: 127 mmol/L — ABNORMAL LOW (ref 135–145)
Sodium: 130 mmol/L — ABNORMAL LOW (ref 135–145)
Sodium: 131 mmol/L — ABNORMAL LOW (ref 135–145)
Sodium: 134 mmol/L — ABNORMAL LOW (ref 135–145)
Sodium: 131 mmol/L — ABNORMAL LOW (ref 135–145)
TCO2: 25 mmol/L (ref 0–100)
TCO2: 26 mmol/L (ref 0–100)
TCO2: 27 mmol/L (ref 0–100)
TCO2: 26 mmol/L (ref 0–100)
TCO2: 23 mmol/L (ref 0–100)
TCO2: 26 mmol/L (ref 0–100)
TCO2: 21 mmol/L (ref 0–100)

## 2014-12-28 LAB — CBC
HCT: 30.7 % — ABNORMAL LOW (ref 39.0–52.0)
HCT: 22.7 % — ABNORMAL LOW (ref 39.0–52.0)
HCT: 21.8 % — ABNORMAL LOW (ref 39.0–52.0)
Hemoglobin: 10.8 g/dL — ABNORMAL LOW (ref 13.0–17.0)
Hemoglobin: 8 g/dL — ABNORMAL LOW (ref 13.0–17.0)
Hemoglobin: 7.7 g/dL — ABNORMAL LOW (ref 13.0–17.0)
MCH: 31.1 pg (ref 26.0–34.0)
MCH: 30.9 pg (ref 26.0–34.0)
MCH: 30.2 pg (ref 26.0–34.0)
MCHC: 35.2 g/dL (ref 30.0–36.0)
MCHC: 35.2 g/dL (ref 30.0–36.0)
MCHC: 35.3 g/dL (ref 30.0–36.0)
MCV: 88.5 fL (ref 78.0–100.0)
MCV: 87.6 fL (ref 78.0–100.0)
MCV: 85.5 fL (ref 78.0–100.0)
PLATELETS: 83 10*3/uL — AB (ref 150–400)
Platelets: 121 10*3/uL — ABNORMAL LOW (ref 150–400)
Platelets: 130 10*3/uL — ABNORMAL LOW (ref 150–400)
RBC: 3.47 MIL/uL — AB (ref 4.22–5.81)
RBC: 2.59 MIL/uL — ABNORMAL LOW (ref 4.22–5.81)
RBC: 2.55 MIL/uL — ABNORMAL LOW (ref 4.22–5.81)
RDW: 14.3 % (ref 11.5–15.5)
RDW: 14.2 % (ref 11.5–15.5)
RDW: 14.8 % (ref 11.5–15.5)
WBC: 15.2 10*3/uL — ABNORMAL HIGH (ref 4.0–10.5)
WBC: 12 10*3/uL — ABNORMAL HIGH (ref 4.0–10.5)
WBC: 11.9 10*3/uL — ABNORMAL HIGH (ref 4.0–10.5)

## 2014-12-28 LAB — PROTIME-INR
INR: 2.77 — ABNORMAL HIGH (ref 0.00–1.49)
INR: 2.09 — ABNORMAL HIGH (ref 0.00–1.49)
INR: 2.18 — ABNORMAL HIGH (ref 0.00–1.49)
INR: 1.8 — ABNORMAL HIGH (ref 0.00–1.49)
INR: 1.85 — ABNORMAL HIGH (ref 0.00–1.49)
PROTHROMBIN TIME: 28.8 s — AB (ref 11.6–15.2)
Prothrombin Time: 23.4 seconds — ABNORMAL HIGH (ref 11.6–15.2)
Prothrombin Time: 24.1 seconds — ABNORMAL HIGH (ref 11.6–15.2)
Prothrombin Time: 20.8 seconds — ABNORMAL HIGH (ref 11.6–15.2)
Prothrombin Time: 21.3 seconds — ABNORMAL HIGH (ref 11.6–15.2)

## 2014-12-28 LAB — POCT I-STAT 3, ART BLOOD GAS (G3+)
Acid-Base Excess: 4 mmol/L — ABNORMAL HIGH (ref 0.0–2.0)
Acid-Base Excess: 1 mmol/L (ref 0.0–2.0)
Acid-Base Excess: 1 mmol/L (ref 0.0–2.0)
Acid-base deficit: 1 mmol/L (ref 0.0–2.0)
Bicarbonate: 27.9 mEq/L — ABNORMAL HIGH (ref 20.0–24.0)
Bicarbonate: 25.1 mEq/L — ABNORMAL HIGH (ref 20.0–24.0)
Bicarbonate: 23.7 mEq/L (ref 20.0–24.0)
Bicarbonate: 24.5 mEq/L — ABNORMAL HIGH (ref 20.0–24.0)
O2 Saturation: 100 %
O2 Saturation: 100 %
O2 Saturation: 96 %
O2 Saturation: 99 %
Patient temperature: 35.4
Patient temperature: 37
TCO2: 29 mmol/L (ref 0–100)
TCO2: 26 mmol/L (ref 0–100)
TCO2: 25 mmol/L (ref 0–100)
TCO2: 26 mmol/L (ref 0–100)
pCO2 arterial: 40.4 mmHg (ref 35.0–45.0)
pCO2 arterial: 38.8 mmHg (ref 35.0–45.0)
pCO2 arterial: 35.4 mmHg (ref 35.0–45.0)
pCO2 arterial: 35.2 mmHg (ref 35.0–45.0)
pH, Arterial: 7.447 (ref 7.350–7.450)
pH, Arterial: 7.419 (ref 7.350–7.450)
pH, Arterial: 7.427 (ref 7.350–7.450)
pH, Arterial: 7.452 — ABNORMAL HIGH (ref 7.350–7.450)
pO2, Arterial: 378 mmHg — ABNORMAL HIGH (ref 80.0–100.0)
pO2, Arterial: 270 mmHg — ABNORMAL HIGH (ref 80.0–100.0)
pO2, Arterial: 74 mmHg — ABNORMAL LOW (ref 80.0–100.0)
pO2, Arterial: 150 mmHg — ABNORMAL HIGH (ref 80.0–100.0)

## 2014-12-28 LAB — CREATININE, SERUM
Creatinine, Ser: 0.77 mg/dL (ref 0.61–1.24)
GFR calc Af Amer: >60 mL/min (ref >60)
GFR calc non Af Amer: >60 mL/min (ref >60)

## 2014-12-28 LAB — POCT I-STAT 4, (NA,K, GLUC, HGB,HCT)
Glucose, Bld: 178 mg/dL — ABNORMAL HIGH (ref 65–99)
HCT: 31 % — ABNORMAL LOW (ref 39.0–52.0)
Hemoglobin: 10.5 g/dL — ABNORMAL LOW (ref 13.0–17.0)
Potassium: 4.6 mmol/L (ref 3.5–5.1)
Sodium: 133 mmol/L — ABNORMAL LOW (ref 135–145)

## 2014-12-28 LAB — BASIC METABOLIC PANEL
Anion gap: 7 (ref 5–15)
BUN: 11 mg/dL (ref 6–20)
CALCIUM: 8.8 mg/dL — AB (ref 8.9–10.3)
CO2: 28 mmol/L (ref 22–32)
CREATININE: 0.86 mg/dL (ref 0.61–1.24)
Chloride: 96 mmol/L — ABNORMAL LOW (ref 101–111)
GFR calc Af Amer: >60 mL/min (ref >60)
GFR calc non Af Amer: >60 mL/min (ref >60)
Glucose, Bld: 98 mg/dL (ref 65–99)
POTASSIUM: 4.4 mmol/L (ref 3.5–5.1)
Sodium: 131 mmol/L — ABNORMAL LOW (ref 135–145)

## 2014-12-28 LAB — GLUCOSE, CAPILLARY
Glucose-Capillary: 187 mg/dL — ABNORMAL HIGH (ref 65–99)
Glucose-Capillary: 161 mg/dL — ABNORMAL HIGH (ref 65–99)
Glucose-Capillary: 166 mg/dL — ABNORMAL HIGH (ref 65–99)

## 2014-12-28 LAB — PLATELET COUNT
Platelets: 71 10*3/uL — ABNORMAL LOW (ref 150–400)
Platelets: 89 10*3/uL — ABNORMAL LOW (ref 150–400)

## 2014-12-28 LAB — FIBRINOGEN: Fibrinogen: 133 mg/dL — ABNORMAL LOW (ref 204–475)

## 2014-12-28 LAB — PREPARE RBC (CROSSMATCH)

## 2014-12-28 LAB — APTT
aPTT: 49 seconds — ABNORMAL HIGH (ref 24–37)
aPTT: 42 seconds — ABNORMAL HIGH (ref 24–37)
aPTT: 39 seconds — ABNORMAL HIGH (ref 24–37)
aPTT: 39 seconds — ABNORMAL HIGH (ref 24–37)

## 2014-12-28 LAB — HEMOGLOBIN AND HEMATOCRIT, BLOOD
HCT: 25.3 % — ABNORMAL LOW (ref 39.0–52.0)
HCT: 27.5 % — ABNORMAL LOW (ref 39.0–52.0)
HEMATOCRIT: 23.8 % — AB (ref 39.0–52.0)
HEMOGLOBIN: 8.3 g/dL — AB (ref 13.0–17.0)
Hemoglobin: 8.9 g/dL — ABNORMAL LOW (ref 13.0–17.0)
Hemoglobin: 9.7 g/dL — ABNORMAL LOW (ref 13.0–17.0)

## 2014-12-28 LAB — MAGNESIUM: Magnesium: 2.7 mg/dL — ABNORMAL HIGH (ref 1.7–2.4)

## 2014-12-28 SURGERY — CORONARY ARTERY BYPASS GRAFTING (CABG)
Anesthesia: General | Site: Chest

## 2014-12-28 MED ORDER — ACETAMINOPHEN 500 MG PO TABS
1000.0000 mg | ORAL_TABLET | Freq: Four times a day (QID) | ORAL | Status: AC
  Administered 2014-12-29 – 2015-01-02 (×12): 1000 mg via ORAL
  Filled 2014-12-28 (×19): qty 2

## 2014-12-28 MED ORDER — LACTATED RINGERS IV SOLN: INTRAVENOUS | Status: DC

## 2014-12-28 MED ORDER — HEMOSTATIC AGENTS (NO CHARGE) OPTIME
TOPICAL | Status: DC | PRN
  Administered 2014-12-28 (×2): 1 via TOPICAL

## 2014-12-28 MED ORDER — NOREPINEPHRINE BITARTRATE 1 MG/ML IV SOLN
0.0000 ug/min | INTRAVENOUS | Status: AC
  Filled 2014-12-28 (×2): qty 4

## 2014-12-28 MED ORDER — FENTANYL CITRATE (PF) 100 MCG/2ML IJ SOLN
INTRAMUSCULAR | Status: DC | PRN
  Administered 2014-12-28 (×2): 100 ug via INTRAVENOUS
  Administered 2014-12-28: 50 ug via INTRAVENOUS
  Administered 2014-12-28: 100 ug via INTRAVENOUS
  Administered 2014-12-28: 50 ug via INTRAVENOUS
  Administered 2014-12-28: 100 ug via INTRAVENOUS
  Administered 2014-12-28: 50 ug via INTRAVENOUS
  Administered 2014-12-28: 100 ug via INTRAVENOUS

## 2014-12-28 MED ORDER — SODIUM CHLORIDE 0.9 % IV SOLN: Freq: Once | INTRAVENOUS | Status: DC

## 2014-12-28 MED ORDER — METOPROLOL TARTRATE 1 MG/ML IV SOLN
2.5000 mg | INTRAVENOUS | Status: DC | PRN
  Administered 2014-12-30: 5 mg via INTRAVENOUS

## 2014-12-28 MED ORDER — SODIUM CHLORIDE 0.9 % IJ SOLN
3.0000 mL | INTRAMUSCULAR | Status: DC | PRN
  Administered 2014-12-30: 3 mL via INTRAVENOUS
  Filled 2014-12-28: qty 3

## 2014-12-28 MED ORDER — GLYCOPYRROLATE 0.2 MG/ML IJ SOLN
INTRAMUSCULAR | Status: AC
  Filled 2014-12-28: qty 1

## 2014-12-28 MED ORDER — SODIUM CHLORIDE 0.9 % IV SOLN
INTRAVENOUS | Status: DC
  Administered 2014-12-28: 17:00:00 via INTRAVENOUS

## 2014-12-28 MED ORDER — HEPARIN SODIUM (PORCINE) 1000 UNIT/ML IJ SOLN
INTRAMUSCULAR | Status: AC
  Filled 2014-12-28: qty 1

## 2014-12-28 MED ORDER — ARTIFICIAL TEARS OP OINT
TOPICAL_OINTMENT | OPHTHALMIC | Status: AC
  Filled 2014-12-28: qty 3.5

## 2014-12-28 MED ORDER — MAGNESIUM SULFATE 4 GM/100ML IV SOLN
4.0000 g | Freq: Once | INTRAVENOUS | Status: AC
  Administered 2014-12-28: 4 g via INTRAVENOUS
  Filled 2014-12-28: qty 100

## 2014-12-28 MED ORDER — METOPROLOL TARTRATE 12.5 MG HALF TABLET
12.5000 mg | ORAL_TABLET | Freq: Two times a day (BID) | ORAL | Status: DC
  Filled 2014-12-28 (×3): qty 1

## 2014-12-28 MED ORDER — MIDAZOLAM HCL 10 MG/2ML IJ SOLN
INTRAMUSCULAR | Status: AC
  Filled 2014-12-28: qty 4

## 2014-12-28 MED ORDER — DEXMEDETOMIDINE HCL IN NACL 200 MCG/50ML IV SOLN
0.0000 ug/kg/h | INTRAVENOUS | Status: DC
  Administered 2014-12-28: 0.5 ug/kg/h via INTRAVENOUS
  Administered 2014-12-28: 0.7 ug/kg/h via INTRAVENOUS
  Filled 2014-12-28: qty 50

## 2014-12-28 MED ORDER — DEXMEDETOMIDINE HCL IN NACL 200 MCG/50ML IV SOLN
INTRAVENOUS | Status: AC
  Filled 2014-12-28: qty 50

## 2014-12-28 MED ORDER — MORPHINE SULFATE 2 MG/ML IJ SOLN
1.0000 mg | INTRAMUSCULAR | Status: AC | PRN
  Administered 2014-12-29: 2 mg via INTRAVENOUS
  Filled 2014-12-28: qty 1

## 2014-12-28 MED ORDER — EPINEPHRINE HCL 1 MG/ML IJ SOLN
0.5000 ug/min | INTRAVENOUS | Status: DC
  Administered 2014-12-28: 9 ug/min via INTRAVENOUS
  Administered 2014-12-29: 2 ug/min via INTRAVENOUS
  Administered 2014-12-30: 7 ug/min via INTRAVENOUS
  Administered 2014-12-30: 8 ug/min via INTRAVENOUS
  Administered 2014-12-31: 9 ug/min via INTRAVENOUS
  Administered 2014-12-31: 7 ug/min via INTRAVENOUS
  Administered 2015-01-01: 6 ug/min via INTRAVENOUS
  Filled 2014-12-28 (×7): qty 4

## 2014-12-28 MED ORDER — LIDOCAINE HCL (CARDIAC) 20 MG/ML IV SOLN
INTRAVENOUS | Status: AC
  Filled 2014-12-28: qty 5

## 2014-12-28 MED ORDER — MAGNESIUM 300 MG PO CAPS: 300.0000 mg | ORAL_CAPSULE | Freq: Every day | ORAL | Status: DC

## 2014-12-28 MED ORDER — INSULIN REGULAR BOLUS VIA INFUSION
0.0000 [IU] | Freq: Three times a day (TID) | INTRAVENOUS | Status: DC
  Filled 2014-12-28: qty 10

## 2014-12-28 MED ORDER — GELATIN ABSORBABLE MT POWD
OROMUCOSAL | Status: DC | PRN
  Administered 2014-12-28 (×3): 4 mL via TOPICAL

## 2014-12-28 MED ORDER — ACETAMINOPHEN 160 MG/5ML PO SOLN: 650.0000 mg | Freq: Once | ORAL | Status: AC

## 2014-12-28 MED ORDER — SODIUM CHLORIDE 0.45 % IV SOLN
INTRAVENOUS | Status: DC | PRN
  Administered 2014-12-28: 17:00:00 via INTRAVENOUS

## 2014-12-28 MED ORDER — METOPROLOL TARTRATE 25 MG/10 ML ORAL SUSPENSION
12.5000 mg | Freq: Two times a day (BID) | ORAL | Status: DC
  Filled 2014-12-28 (×3): qty 5

## 2014-12-28 MED ORDER — STERILE WATER FOR INJECTION IJ SOLN
INTRAMUSCULAR | Status: AC
  Filled 2014-12-28: qty 10

## 2014-12-28 MED ORDER — FENTANYL CITRATE (PF) 250 MCG/5ML IJ SOLN
INTRAMUSCULAR | Status: AC
  Filled 2014-12-28: qty 5

## 2014-12-28 MED ORDER — PANTOPRAZOLE SODIUM 40 MG PO TBEC: 40.0000 mg | DELAYED_RELEASE_TABLET | Freq: Every day | ORAL | Status: DC

## 2014-12-28 MED ORDER — TAMSULOSIN HCL 0.4 MG PO CAPS
0.4000 mg | ORAL_CAPSULE | Freq: Two times a day (BID) | ORAL | Status: DC
  Administered 2014-12-29 – 2015-01-11 (×27): 0.4 mg via ORAL
  Filled 2014-12-28 (×30): qty 1

## 2014-12-28 MED ORDER — ROCURONIUM BROMIDE 100 MG/10ML IV SOLN
INTRAVENOUS | Status: DC | PRN
  Administered 2014-12-28: 50 mg via INTRAVENOUS

## 2014-12-28 MED ORDER — CALCIUM CHLORIDE 10 % IV SOLN
INTRAVENOUS | Status: DC | PRN
  Administered 2014-12-28 (×2): 200 mg via INTRAVENOUS
  Administered 2014-12-28 (×2): 300 mg via INTRAVENOUS
  Administered 2014-12-28: 400 mg via INTRAVENOUS

## 2014-12-28 MED ORDER — SODIUM CHLORIDE 0.9 % IV SOLN
Freq: Once | INTRAVENOUS | Status: AC
  Administered 2014-12-28: 19:00:00 via INTRAVENOUS

## 2014-12-28 MED ORDER — MIDAZOLAM HCL 5 MG/5ML IJ SOLN
INTRAMUSCULAR | Status: DC | PRN
  Administered 2014-12-28 (×5): 1 mg via INTRAVENOUS
  Administered 2014-12-28: 2 mg via INTRAVENOUS

## 2014-12-28 MED ORDER — SODIUM CHLORIDE 0.9 % IJ SOLN
INTRAMUSCULAR | Status: AC
  Filled 2014-12-28: qty 10

## 2014-12-28 MED ORDER — LACTATED RINGERS IV SOLN
INTRAVENOUS | Status: DC
  Administered 2014-12-28 – 2015-01-03 (×3): via INTRAVENOUS

## 2014-12-28 MED ORDER — POTASSIUM CHLORIDE 10 MEQ/50ML IV SOLN
10.0000 meq | INTRAVENOUS | Status: AC
  Administered 2014-12-28 – 2014-12-29 (×3): 10 meq via INTRAVENOUS
  Filled 2014-12-28 (×3): qty 50

## 2014-12-28 MED ORDER — SODIUM CHLORIDE 0.9 % IJ SOLN
3.0000 mL | Freq: Two times a day (BID) | INTRAMUSCULAR | Status: DC
  Administered 2014-12-29 – 2015-01-04 (×8): 3 mL via INTRAVENOUS

## 2014-12-28 MED ORDER — POTASSIUM CHLORIDE 10 MEQ/50ML IV SOLN: 10.0000 meq | INTRAVENOUS | Status: AC

## 2014-12-28 MED ORDER — HEPARIN SODIUM (PORCINE) 1000 UNIT/ML IJ SOLN
INTRAMUSCULAR | Status: DC | PRN
  Administered 2014-12-28: 39000 [IU] via INTRAVENOUS

## 2014-12-28 MED ORDER — METOCLOPRAMIDE HCL 5 MG/ML IJ SOLN
10.0000 mg | Freq: Four times a day (QID) | INTRAMUSCULAR | Status: AC
  Administered 2014-12-28 – 2014-12-29 (×3): 10 mg via INTRAVENOUS
  Filled 2014-12-28 (×4): qty 2

## 2014-12-28 MED ORDER — DEXTROSE 5 % IV SOLN
1.5000 g | Freq: Two times a day (BID) | INTRAVENOUS | Status: AC
  Administered 2014-12-28 – 2014-12-30 (×4): 1.5 g via INTRAVENOUS
  Filled 2014-12-28 (×4): qty 1.5

## 2014-12-28 MED ORDER — ETOMIDATE 2 MG/ML IV SOLN
INTRAVENOUS | Status: DC | PRN
  Administered 2014-12-28: 14 mg via INTRAVENOUS

## 2014-12-28 MED ORDER — DOCUSATE SODIUM 100 MG PO CAPS
200.0000 mg | ORAL_CAPSULE | Freq: Every day | ORAL | Status: DC
  Administered 2014-12-29 – 2015-01-04 (×6): 200 mg via ORAL
  Filled 2014-12-28 (×6): qty 2

## 2014-12-28 MED ORDER — VECURONIUM BROMIDE 10 MG IV SOLR
INTRAVENOUS | Status: DC | PRN
  Administered 2014-12-28: 3 mg via INTRAVENOUS
  Administered 2014-12-28: 5 mg via INTRAVENOUS
  Administered 2014-12-28: 2 mg via INTRAVENOUS

## 2014-12-28 MED ORDER — ALBUMIN HUMAN 5 % IV SOLN
INTRAVENOUS | Status: DC | PRN
  Administered 2014-12-28 (×2): via INTRAVENOUS

## 2014-12-28 MED ORDER — PHENYLEPHRINE HCL 10 MG/ML IJ SOLN
0.0000 ug/min | INTRAVENOUS | Status: DC
  Administered 2014-12-28: 30 ug/min via INTRAVENOUS
  Administered 2014-12-28: 100 ug/min via INTRAVENOUS
  Filled 2014-12-28 (×3): qty 2

## 2014-12-28 MED ORDER — NITROGLYCERIN IN D5W 200-5 MCG/ML-% IV SOLN: 0.0000 ug/min | INTRAVENOUS | Status: DC

## 2014-12-28 MED ORDER — MIDAZOLAM HCL 2 MG/2ML IJ SOLN: 2.0000 mg | INTRAMUSCULAR | Status: DC | PRN

## 2014-12-28 MED ORDER — PROTAMINE SULFATE 10 MG/ML IV SOLN
INTRAVENOUS | Status: AC
  Filled 2014-12-28: qty 5

## 2014-12-28 MED ORDER — LIDOCAINE HCL (CARDIAC) 20 MG/ML IV SOLN
INTRAVENOUS | Status: DC | PRN
  Administered 2014-12-28: 60 mg via INTRAVENOUS
  Administered 2014-12-28: 40 mg via INTRAVENOUS

## 2014-12-28 MED ORDER — SODIUM CHLORIDE 0.9 % IV SOLN
250.0000 mL | INTRAVENOUS | Status: DC
  Administered 2014-12-29: 250 mL via INTRAVENOUS

## 2014-12-28 MED ORDER — CHLORHEXIDINE GLUCONATE 4 % EX LIQD: 30.0000 mL | CUTANEOUS | Status: DC

## 2014-12-28 MED ORDER — MORPHINE SULFATE 2 MG/ML IJ SOLN
2.0000 mg | INTRAMUSCULAR | Status: DC | PRN
  Administered 2014-12-29 – 2014-12-31 (×3): 2 mg via INTRAVENOUS
  Filled 2014-12-28 (×4): qty 1

## 2014-12-28 MED ORDER — LACTATED RINGERS IV SOLN
INTRAVENOUS | Status: DC | PRN
  Administered 2014-12-28 (×3): via INTRAVENOUS

## 2014-12-28 MED ORDER — ALBUMIN HUMAN 5 % IV SOLN
250.0000 mL | INTRAVENOUS | Status: AC | PRN
  Administered 2014-12-28 (×4): 250 mL via INTRAVENOUS
  Filled 2014-12-28 (×2): qty 250

## 2014-12-28 MED ORDER — BRIMONIDINE TARTRATE-TIMOLOL 0.2-0.5 % OP SOLN: 1.0000 [drp] | Freq: Two times a day (BID) | OPHTHALMIC | Status: DC

## 2014-12-28 MED ORDER — VECURONIUM BROMIDE 10 MG IV SOLR
INTRAVENOUS | Status: AC
  Filled 2014-12-28: qty 10

## 2014-12-28 MED ORDER — ACETAMINOPHEN 160 MG/5ML PO SOLN
1000.0000 mg | Freq: Four times a day (QID) | ORAL | Status: AC
  Administered 2014-12-28: 1000 mg
  Filled 2014-12-28: qty 40.6

## 2014-12-28 MED ORDER — DOPAMINE-DEXTROSE 3.2-5 MG/ML-% IV SOLN
2.5000 ug/kg/min | INTRAVENOUS | Status: AC
  Administered 2014-12-28: 3 ug/kg/min via INTRAVENOUS
  Administered 2015-01-01: 5 ug/kg/min via INTRAVENOUS
  Administered 2015-01-02: 3 ug/kg/min via INTRAVENOUS
  Filled 2014-12-28 (×4): qty 250

## 2014-12-28 MED ORDER — ROCURONIUM BROMIDE 50 MG/5ML IV SOLN
INTRAVENOUS | Status: AC
  Filled 2014-12-28: qty 1

## 2014-12-28 MED ORDER — TIMOLOL MALEATE 0.5 % OP SOLN
1.0000 [drp] | Freq: Two times a day (BID) | OPHTHALMIC | Status: DC
  Administered 2014-12-28 – 2015-01-11 (×28): 1 [drp] via OPHTHALMIC
  Filled 2014-12-28 (×2): qty 5

## 2014-12-28 MED ORDER — CHLORHEXIDINE GLUCONATE 0.12% ORAL RINSE (MEDLINE KIT)
15.0000 mL | Freq: Two times a day (BID) | OROMUCOSAL | Status: DC
  Administered 2014-12-28 – 2014-12-30 (×4): 15 mL via OROMUCOSAL

## 2014-12-28 MED ORDER — FINASTERIDE 5 MG PO TABS
5.0000 mg | ORAL_TABLET | Freq: Every day | ORAL | Status: DC
  Administered 2014-12-29 – 2015-01-10 (×13): 5 mg via ORAL
  Filled 2014-12-28 (×15): qty 1

## 2014-12-28 MED ORDER — PRAVASTATIN SODIUM 40 MG PO TABS
40.0000 mg | ORAL_TABLET | Freq: Every day | ORAL | Status: DC
  Administered 2014-12-29 – 2015-01-10 (×13): 40 mg via ORAL
  Filled 2014-12-28 (×15): qty 1

## 2014-12-28 MED ORDER — BISACODYL 10 MG RE SUPP
10.0000 mg | Freq: Every day | RECTAL | Status: DC
  Administered 2015-01-01: 10 mg via RECTAL
  Filled 2014-12-28: qty 1

## 2014-12-28 MED ORDER — ANTISEPTIC ORAL RINSE SOLUTION (CORINZ)
7.0000 mL | Freq: Four times a day (QID) | OROMUCOSAL | Status: DC
  Administered 2014-12-28 – 2014-12-30 (×6): 7 mL via OROMUCOSAL

## 2014-12-28 MED ORDER — ASPIRIN EC 325 MG PO TBEC
325.0000 mg | DELAYED_RELEASE_TABLET | Freq: Every day | ORAL | Status: DC
  Administered 2014-12-29 – 2015-01-02 (×5): 325 mg via ORAL
  Filled 2014-12-28 (×6): qty 1

## 2014-12-28 MED ORDER — LACTATED RINGERS IV SOLN: 500.0000 mL | Freq: Once | INTRAVENOUS | Status: DC | PRN

## 2014-12-28 MED ORDER — ACETAMINOPHEN 650 MG RE SUPP
650.0000 mg | Freq: Once | RECTAL | Status: AC
  Administered 2014-12-28: 650 mg via RECTAL

## 2014-12-28 MED ORDER — BISACODYL 5 MG PO TBEC
10.0000 mg | DELAYED_RELEASE_TABLET | Freq: Every day | ORAL | Status: DC
  Administered 2014-12-29 – 2015-01-04 (×5): 10 mg via ORAL
  Filled 2014-12-28 (×5): qty 2

## 2014-12-28 MED ORDER — 0.9 % SODIUM CHLORIDE (POUR BTL) OPTIME
TOPICAL | Status: DC | PRN
  Administered 2014-12-28: 6000 mL

## 2014-12-28 MED ORDER — PANTOPRAZOLE SODIUM 40 MG PO TBEC
40.0000 mg | DELAYED_RELEASE_TABLET | Freq: Every day | ORAL | Status: DC
  Administered 2014-12-29 – 2015-01-04 (×7): 40 mg via ORAL
  Filled 2014-12-28 (×7): qty 1

## 2014-12-28 MED ORDER — ASPIRIN 81 MG PO CHEW: 324.0000 mg | CHEWABLE_TABLET | Freq: Every day | ORAL | Status: DC

## 2014-12-28 MED ORDER — VANCOMYCIN HCL IN DEXTROSE 1-5 GM/200ML-% IV SOLN
1000.0000 mg | Freq: Once | INTRAVENOUS | Status: AC
  Administered 2014-12-28: 1000 mg via INTRAVENOUS
  Filled 2014-12-28: qty 200

## 2014-12-28 MED ORDER — ETOMIDATE 2 MG/ML IV SOLN
INTRAVENOUS | Status: AC
  Filled 2014-12-28: qty 10

## 2014-12-28 MED ORDER — OXYCODONE HCL 5 MG PO TABS
5.0000 mg | ORAL_TABLET | ORAL | Status: DC | PRN
  Administered 2014-12-29 – 2014-12-30 (×5): 10 mg via ORAL
  Administered 2014-12-30: 5 mg via ORAL
  Administered 2015-01-01 – 2015-01-03 (×2): 10 mg via ORAL
  Filled 2014-12-28: qty 2
  Filled 2014-12-28: qty 1
  Filled 2014-12-28 (×6): qty 2

## 2014-12-28 MED ORDER — PROTAMINE SULFATE 10 MG/ML IV SOLN
INTRAVENOUS | Status: AC
  Filled 2014-12-28: qty 25

## 2014-12-28 MED ORDER — EPHEDRINE SULFATE 50 MG/ML IJ SOLN
INTRAMUSCULAR | Status: AC
  Filled 2014-12-28: qty 1

## 2014-12-28 MED ORDER — ONDANSETRON HCL 4 MG/2ML IJ SOLN: 4.0000 mg | Freq: Four times a day (QID) | INTRAMUSCULAR | Status: DC | PRN

## 2014-12-28 MED ORDER — BRIMONIDINE TARTRATE 0.2 % OP SOLN
1.0000 [drp] | Freq: Two times a day (BID) | OPHTHALMIC | Status: DC
  Administered 2014-12-28 – 2015-01-11 (×28): 1 [drp] via OPHTHALMIC
  Filled 2014-12-28 (×2): qty 5

## 2014-12-28 MED ORDER — PAROXETINE HCL 10 MG PO TABS: 10.0000 mg | ORAL_TABLET | Freq: Every evening | ORAL | Status: DC | PRN

## 2014-12-28 MED ORDER — DOPAMINE-DEXTROSE 3.2-5 MG/ML-% IV SOLN
INTRAVENOUS | Status: DC | PRN
  Administered 2014-12-28: 3 ug/kg/min via INTRAVENOUS

## 2014-12-28 MED ORDER — PROTAMINE SULFATE 10 MG/ML IV SOLN
INTRAVENOUS | Status: DC | PRN
  Administered 2014-12-28: 350 mg via INTRAVENOUS

## 2014-12-28 MED ORDER — TRAMADOL HCL 50 MG PO TABS
50.0000 mg | ORAL_TABLET | ORAL | Status: DC | PRN
  Administered 2014-12-29 (×2): 100 mg via ORAL
  Filled 2014-12-28 (×2): qty 2

## 2014-12-28 MED ORDER — SODIUM CHLORIDE 0.9 % IV SOLN
INTRAVENOUS | Status: DC
  Administered 2014-12-28: 18:00:00 via INTRAVENOUS
  Filled 2014-12-28: qty 2.5

## 2014-12-28 MED ORDER — SODIUM CHLORIDE 0.9 % IV SOLN
Freq: Once | INTRAVENOUS | Status: AC
  Administered 2014-12-28: 09:00:00 via INTRAVENOUS

## 2014-12-28 MED ORDER — FAMOTIDINE IN NACL 20-0.9 MG/50ML-% IV SOLN
20.0000 mg | Freq: Two times a day (BID) | INTRAVENOUS | Status: AC
  Administered 2014-12-28: 20 mg via INTRAVENOUS

## 2014-12-28 MED ORDER — MAGNESIUM CHLORIDE 64 MG PO TBEC
2.0000 | DELAYED_RELEASE_TABLET | Freq: Every day | ORAL | Status: DC
  Administered 2014-12-29 – 2015-01-11 (×14): 128 mg via ORAL
  Filled 2014-12-28 (×15): qty 2

## 2014-12-28 MED FILL — Heparin Sodium (Porcine) Inj 1000 Unit/ML: INTRAMUSCULAR | Qty: 30 | Status: AC

## 2014-12-28 MED FILL — Potassium Chloride Inj 2 mEq/ML: INTRAVENOUS | Qty: 40 | Status: AC

## 2014-12-28 MED FILL — Magnesium Sulfate Inj 50%: INTRAMUSCULAR | Qty: 10 | Status: AC

## 2014-12-28 SURGICAL SUPPLY — 97 items
BAG DECANTER FOR FLEXI CONT (MISCELLANEOUS) ×3 IMPLANT
BANDAGE ELASTIC 4 VELCRO ST LF (GAUZE/BANDAGES/DRESSINGS) ×3 IMPLANT
BANDAGE ELASTIC 6 VELCRO ST LF (GAUZE/BANDAGES/DRESSINGS) ×3 IMPLANT
BENZOIN TINCTURE PRP APPL 2/3 (GAUZE/BANDAGES/DRESSINGS) ×3 IMPLANT
BLADE STERNUM SYSTEM 6 (BLADE) ×3 IMPLANT
BLADE SURG 10 STRL SS (BLADE) ×3 IMPLANT
BNDG GAUZE ELAST 4 BULKY (GAUZE/BANDAGES/DRESSINGS) ×3 IMPLANT
CANISTER SUCTION 2500CC (MISCELLANEOUS) ×3 IMPLANT
CATH CPB KIT GERHARDT (MISCELLANEOUS) ×3 IMPLANT
CATH THORACIC 28FR (CATHETERS) ×3 IMPLANT
CONN ST 1/4X3/8  BEN (MISCELLANEOUS) ×1
CONN ST 1/4X3/8 BEN (MISCELLANEOUS) ×2 IMPLANT
COVER SURGICAL LIGHT HANDLE (MISCELLANEOUS) ×3 IMPLANT
CRADLE DONUT ADULT HEAD (MISCELLANEOUS) ×3 IMPLANT
DRAIN CHANNEL 10M FLAT 3/4 FLT (DRAIN) ×6 IMPLANT
DRAIN CHANNEL 28F RND 3/8 FF (WOUND CARE) ×6 IMPLANT
DRAPE CARDIOVASCULAR INCISE (DRAPES) ×1
DRAPE SLUSH/WARMER DISC (DRAPES) ×3 IMPLANT
DRAPE SRG 135X102X78XABS (DRAPES) ×2 IMPLANT
DRSG AQUACEL AG ADV 3.5X14 (GAUZE/BANDAGES/DRESSINGS) ×3 IMPLANT
DRSG COVADERM 4X14 (GAUZE/BANDAGES/DRESSINGS) ×3 IMPLANT
ELECT BLADE 4.0 EZ CLEAN MEGAD (MISCELLANEOUS) ×3
ELECT REM PT RETURN 9FT ADLT (ELECTROSURGICAL) ×6
ELECTRODE BLDE 4.0 EZ CLN MEGD (MISCELLANEOUS) ×2 IMPLANT
ELECTRODE REM PT RTRN 9FT ADLT (ELECTROSURGICAL) ×4 IMPLANT
EVACUATOR SILICONE 100CC (DRAIN) ×6 IMPLANT
GAUZE SPONGE 4X4 12PLY STRL (GAUZE/BANDAGES/DRESSINGS) ×6 IMPLANT
GLOVE BIO SURGEON STRL SZ 6 (GLOVE) ×9 IMPLANT
GLOVE BIO SURGEON STRL SZ 6.5 (GLOVE) ×15 IMPLANT
GLOVE BIOGEL PI IND STRL 6 (GLOVE) ×8 IMPLANT
GLOVE BIOGEL PI INDICATOR 6 (GLOVE) ×4
GOWN STRL REUS W/ TWL LRG LVL3 (GOWN DISPOSABLE) ×18 IMPLANT
GOWN STRL REUS W/TWL LRG LVL3 (GOWN DISPOSABLE) ×9
HEMOSTAT POWDER SURGIFOAM 1G (HEMOSTASIS) ×9 IMPLANT
HEMOSTAT SURGICEL 2X14 (HEMOSTASIS) ×3 IMPLANT
KIT BASIN OR (CUSTOM PROCEDURE TRAY) ×3 IMPLANT
KIT CATH SUCT 8FR (CATHETERS) ×3 IMPLANT
KIT ROOM TURNOVER OR (KITS) ×3 IMPLANT
KIT SUCTION CATH 14FR (SUCTIONS) ×6 IMPLANT
KIT VASOVIEW W/TROCAR VH 2000 (KITS) ×6 IMPLANT
LEAD PACING MYOCARDI (MISCELLANEOUS) ×3 IMPLANT
LIQUID BAND (GAUZE/BANDAGES/DRESSINGS) ×3 IMPLANT
MARKER GRAFT CORONARY BYPASS (MISCELLANEOUS) ×9 IMPLANT
NS IRRIG 1000ML POUR BTL (IV SOLUTION) ×18 IMPLANT
PACK OPEN HEART (CUSTOM PROCEDURE TRAY) ×3 IMPLANT
PAD ARMBOARD 7.5X6 YLW CONV (MISCELLANEOUS) ×6 IMPLANT
PAD ELECT DEFIB RADIOL ZOLL (MISCELLANEOUS) ×3 IMPLANT
PENCIL BUTTON HOLSTER BLD 10FT (ELECTRODE) ×3 IMPLANT
PUNCH AORTIC ROTATE  4.5MM 8IN (MISCELLANEOUS) ×3 IMPLANT
SET CARDIOPLEGIA MPS 5001102 (MISCELLANEOUS) ×3 IMPLANT
SPONGE LAP 18X18 X RAY DECT (DISPOSABLE) ×12 IMPLANT
SPONGE LAP 4X18 X RAY DECT (DISPOSABLE) ×6 IMPLANT
SURGIFLO W/THROMBIN 8M KIT (HEMOSTASIS) ×3 IMPLANT
SUT BONE WAX W31G (SUTURE) ×3 IMPLANT
SUT ETHIBOND 2 0 SH (SUTURE) ×4
SUT ETHIBOND 2 0 SH 36X2 (SUTURE) ×8 IMPLANT
SUT ETHILON 3 0 FSL (SUTURE) ×3 IMPLANT
SUT MNCRL AB 4-0 PS2 18 (SUTURE) ×3 IMPLANT
SUT PROLENE 3 0 SH1 36 (SUTURE) ×3 IMPLANT
SUT PROLENE 4 0 RB 1 (SUTURE) ×1
SUT PROLENE 4 0 TF (SUTURE) ×6 IMPLANT
SUT PROLENE 4-0 RB1 .5 CRCL 36 (SUTURE) ×2 IMPLANT
SUT PROLENE 5 0 C 1 36 (SUTURE) ×12 IMPLANT
SUT PROLENE 6 0 C 1 30 (SUTURE) ×21 IMPLANT
SUT PROLENE 6 0 CC (SUTURE) ×9 IMPLANT
SUT PROLENE 7 0 BV1 MDA (SUTURE) ×6 IMPLANT
SUT PROLENE 7.0 RB 3 (SUTURE) ×6 IMPLANT
SUT PROLENE 8 0 BV175 6 (SUTURE) ×15 IMPLANT
SUT SILK  1 MH (SUTURE) ×3
SUT SILK 1 MH (SUTURE) ×6 IMPLANT
SUT SILK 1 TIES 10X30 (SUTURE) ×3 IMPLANT
SUT SILK 2 0 SH CR/8 (SUTURE) ×9 IMPLANT
SUT SILK 2 0 TIES 10X30 (SUTURE) ×3 IMPLANT
SUT SILK 2 0 TIES 17X18 (SUTURE) ×1
SUT SILK 2-0 18XBRD TIE BLK (SUTURE) ×2 IMPLANT
SUT SILK 3 0 SH CR/8 (SUTURE) ×3 IMPLANT
SUT SILK 4 0 TIE 10X30 (SUTURE) ×6 IMPLANT
SUT STEEL 6MS V (SUTURE) ×3 IMPLANT
SUT STEEL SZ 6 DBL 3X14 BALL (SUTURE) ×3 IMPLANT
SUT TEM PAC WIRE 2 0 SH (SUTURE) ×12 IMPLANT
SUT VIC AB 1 CTX 18 (SUTURE) ×6 IMPLANT
SUT VIC AB 2-0 CT1 27 (SUTURE) ×1
SUT VIC AB 2-0 CT1 TAPERPNT 27 (SUTURE) ×2 IMPLANT
SUT VIC AB 2-0 CTX 27 (SUTURE) ×6 IMPLANT
SUT VIC AB 3-0 X1 27 (SUTURE) ×6 IMPLANT
SUTURE E-PAK OPEN HEART (SUTURE) ×3 IMPLANT
SYSTEM SAHARA CHEST DRAIN ATS (WOUND CARE) ×3 IMPLANT
TAPE CLOTH SURG 4X10 WHT LF (GAUZE/BANDAGES/DRESSINGS) ×3 IMPLANT
TOWEL OR 17X24 6PK STRL BLUE (TOWEL DISPOSABLE) ×6 IMPLANT
TOWEL OR 17X26 10 PK STRL BLUE (TOWEL DISPOSABLE) ×6 IMPLANT
TRAY CATH LUMEN 1 20CM STRL (SET/KITS/TRAYS/PACK) ×3 IMPLANT
TRAY FOLEY IC TEMP SENS 16FR (CATHETERS) ×3 IMPLANT
TUBING ART PRESS 48 MALE/FEM (TUBING) ×6 IMPLANT
TUBING INSUFFLATION (TUBING) ×6 IMPLANT
UNDERPAD 30X30 INCONTINENT (UNDERPADS AND DIAPERS) ×3 IMPLANT
WATER STERILE IRR 1000ML POUR (IV SOLUTION) ×6 IMPLANT
YANKAUER SUCT BULB TIP NO VENT (SUCTIONS) ×3 IMPLANT

## 2014-12-28 NOTE — Anesthesia Postprocedure Evaluation (Signed)
  Anesthesia Post-op Note  Patient: Jacob Reyes  Procedure(s) Performed: Procedure(s) with comments: CORONARY ARTERY BYPASS GRAFTING (CABG), ON PUMP, TIMES SIX, USING LEFT INTERNAL MAMMARY ARTERY, RIGHT GREATER SAPHENOUS VEIN HARVESTED ENDOSCOPICALLY (N/A) - -SEQ LIMA to MID LAD and DISTAL LAD -SVG to DIAGONAL -SEQ SVG to OM1 and OM2 -SVG to PDA TRANSESOPHAGEAL ECHOCARDIOGRAM (TEE) (N/A)  Patient Location: SICU  Anesthesia Type:General  Level of Consciousness: sedated  Airway and Oxygen Therapy: Patient re-intubated and Patient remains intubated per anesthesia plan  Post-op Pain: none  Post-op Assessment: Post-op Vital signs reviewed LLE Motor Response: Responds to commands, Purposeful movement   RLE Motor Response: Responds to commands, Purposeful movement        Post-op Vital Signs: Reviewed  Last Vitals:  Filed Vitals:   12/28/14 1915  BP:   Pulse: 92  Temp: 36.1 C  Resp: 14    Complications: No apparent anesthesia complications

## 2014-12-28 NOTE — OR Nursing (Signed)
40 minute call to SICU charge nurse at 1511. Charge nurse informed that delay since last call was due to hematoma in leg.

## 2014-12-28 NOTE — OR Nursing (Signed)
Twenty minute call to SICU at 1542. Spoke to [employer].

## 2014-12-28 NOTE — Progress Notes (Signed)
Dr. Servando Snare in

## 2014-12-28 NOTE — Brief Op Note (Addendum)
12/28/2014  1:20 PM  PATIENT:  Jacob Reyes  81 y.o. male  PRE-OPERATIVE DIAGNOSIS:  CAD  POST-OPERATIVE DIAGNOSIS:  CAD  PROCEDURE:  Procedure(s):  CORONARY ARTERY BYPASS GRAFTING x 6 -SEQ LIMA to MID LAD and DISTAL LAD -SVG to DIAGONAL -SEQ SVG to OM1 and distal cix -SVG to mid PDA  ENDOSCOPIC HARVEST GREATER SAPHENOUS VEIN  -Right leg -Re-explored for bleeding -Placement of JP Drain  TRANSESOPHAGEAL ECHOCARDIOGRAM (TEE) (N/A)  SURGEON:  Surgeon(s) and Role:    * [name], MD - Primary  PHYSICIAN ASSISTANT: Erin Barrett PA-C  ANESTHESIA:   general  EBL:  Total I/O In: 800 [I.V.:800] Out: 1675 [Urine:1675]  BLOOD ADMINISTERED: CELLSAVER, FFP, Packed Cells, Cryo  DRAINS: Left Pleural Chest Tube, Mediastinal Chest Drains right leg drain  LOCAL MEDICATIONS USED:  NONE  SPECIMEN:  No Specimen  DISPOSITION OF SPECIMEN:  N/A  COUNTS:  YES  TOURNIQUET:  * No tourniquets in log *  DICTATION: .Dragon Dictation  PLAN OF CARE: Admit to inpatient   PATIENT DISPOSITION:  ICU - intubated and hemodynamically stable.   Delay start of Pharmacological VTE agent (>24hrs) due to surgical blood loss or risk of bleeding: yes

## 2014-12-28 NOTE — Progress Notes (Signed)
Patient ID: Jacob Reyes, male   DOB: Oct 09, 1925, 81 y.o.   MRN: [id number] EVENING ROUNDS NOTE :     [address]       [place] [address]             [phone]                 Day of Surgery Procedure(s) (LRB): CORONARY ARTERY BYPASS GRAFTING (CABG), ON PUMP, TIMES SIX, USING LEFT INTERNAL MAMMARY ARTERY, RIGHT GREATER SAPHENOUS VEIN HARVESTED ENDOSCOPICALLY (N/A) TRANSESOPHAGEAL ECHOCARDIOGRAM (TEE) (N/A)  Total Length of Stay:  LOS: 0 days  BP 80/48 mmHg  Pulse 92  Temp(Src) 97 F (36.1 C) (Oral)  Resp 14  Ht 6\' 1"  (1.854 m)  Wt 191 lb 12.8 oz (87 kg)  BMI 25.31 kg/m2  SpO2 100%  .Intake/Output      08/08 0701 - 08/09 0700   I.V. (mL/kg) 2937.5 (33.8)   Blood 3966   IV Piggyback 1400   Total Intake(mL/kg) 8303.5 (95.4)   Urine (mL/kg/hr) 3525 (3.2)   Drains 40 (0)   Blood 2750 (2.5)   Chest Tube 400 (0.4)   Total Output 6715   Net +1588.5         . sodium chloride 10 mL/hr at 12/28/14 1715  . [START ON 12/29/2014] sodium chloride    . sodium chloride Stopped (12/28/14 1804)  . dexmedetomidine 0.7 mcg/kg/hr (12/28/14 1900)  . epinephrine 10 mcg/min (12/28/14 1720)  . insulin (NOVOLIN-R) infusion 3 Units/hr (12/28/14 1907)  . lactated ringers    . lactated ringers 20 mL/hr at 12/28/14 1749  . nitroGLYCERIN Stopped (12/28/14 1700)  . phenylephrine (NEO-SYNEPHRINE) Adult infusion 100 mcg/min (12/28/14 1900)     Lab Results  Component Value Date   WBC 12.0* 12/28/2014   HGB 8.0* 12/28/2014   HCT 22.7* 12/28/2014   PLT 121* 12/28/2014   GLUCOSE 178* 12/28/2014   ALT 14* 12/25/2014   AST 20 12/25/2014   NA 133* 12/28/2014   K 4.6 12/28/2014   CL 95* 12/28/2014   CREATININE 0.40* 12/28/2014   BUN 8 12/28/2014   CO2 28 12/28/2014   INR 1.80* 12/28/2014   HGBA1C 5.7* 12/25/2014   Getting blood for blood los anemia Pa pressures low- CI low volume sensitive Waking up  Some but will wait on weaning until warm and ci improved 400 ml  from chest tubes total, leg drain slowed down in output  [name] MD  Beeper [phone] Office [phone] 12/28/2014 7:52 PM

## 2014-12-28 NOTE — Anesthesia Procedure Notes (Signed)
Procedure Name: Intubation Date/Time: 12/28/2014 9:09 AM Performed by: Rush Farmer E Pre-anesthesia Checklist: Patient identified, Emergency Drugs available, Suction available, Patient being monitored and Timeout performed Patient Re-evaluated:Patient Re-evaluated prior to inductionOxygen Delivery Method: Circle system utilized Preoxygenation: Pre-oxygenation with 100% oxygen Intubation Type: IV induction Ventilation: Mask ventilation without difficulty Laryngoscope Size: Miller and 3 Grade View: Grade I Tube type: Oral Tube size: 8.0 mm Number of attempts: 2 Airway Equipment and Method: Stylet Placement Confirmation: ETT inserted through vocal cords under direct vision,  positive ETCO2 and breath sounds checked- equal and bilateral Secured at: 22 cm Tube secured with: Tape Dental Injury: Teeth and Oropharynx as per pre-operative assessment  Comments: AOI, per Lawrence Santiago, SRNA with Dr. Smith Robert supervising.

## 2014-12-28 NOTE — Transfer of Care (Signed)
Immediate Anesthesia Transfer of Care Note  Patient: Jacob Reyes  Procedure(s) Performed: Procedure(s) with comments: CORONARY ARTERY BYPASS GRAFTING (CABG), ON PUMP, TIMES SIX, USING LEFT INTERNAL MAMMARY ARTERY, RIGHT GREATER SAPHENOUS VEIN HARVESTED ENDOSCOPICALLY (N/A) - -SEQ LIMA to MID LAD and DISTAL LAD -SVG to DIAGONAL -SEQ SVG to OM1 and OM2 -SVG to PDA TRANSESOPHAGEAL ECHOCARDIOGRAM (TEE) (N/A)  Patient Location: SICU  Anesthesia Type:General  Level of Consciousness: sedated, unresponsive and Patient remains intubated per anesthesia plan  Airway & Oxygen Therapy: Patient remains intubated per anesthesia plan and Patient placed on Ventilator (see vital sign flow sheet for setting)  Post-op Assessment: Report given to RN and Post -op Vital signs reviewed and stable  Post vital signs: Reviewed and stable  Last Vitals:  Filed Vitals:   12/28/14 0639  BP: 196/105  Pulse: 59  Temp: 36.6 C  Resp: 20    Complications: No apparent anesthesia complications

## 2014-12-28 NOTE — Progress Notes (Signed)
  Echocardiogram Transesophageal has been performed.  Jacob Reyes 12/28/2014, 9:44 AM

## 2014-12-28 NOTE — Anesthesia Preprocedure Evaluation (Addendum)
Anesthesia Evaluation  Patient identified by MRN, date of birth, ID band Patient awake    Reviewed: Allergy & Precautions, NPO status , Patient's Chart, lab work & pertinent test results, reviewed documented beta blocker date and time   Airway Mallampati: I       Dental  (+) Teeth Intact   Pulmonary  breath sounds clear to auscultation        Cardiovascular hypertension, Pt. on medications + angina with exertion + CAD Rhythm:Regular Rate:Normal     Neuro/Psych PSYCHIATRIC DISORDERS Anxiety    GI/Hepatic Neg liver ROS, GERD-  Medicated,  Endo/Other  negative endocrine ROS  Renal/GU negative Renal ROS  negative genitourinary   Musculoskeletal negative musculoskeletal ROS (+)   Abdominal   Peds negative pediatric ROS (+)  Hematology negative hematology ROS (+)   Anesthesia Other Findings   Reproductive/Obstetrics negative OB ROS                          Lab Results  Component Value Date   WBC 10.7* 12/25/2014   HGB 16.1 12/25/2014   HCT 45.7 12/25/2014   MCV 92.0 12/25/2014   PLT 164 12/25/2014   Lab Results  Component Value Date   INR 1.21 12/25/2014   Lab Results  Component Value Date   CREATININE 0.74 12/25/2014   BUN 15 12/25/2014   NA 126* 12/25/2014   K 4.7 12/25/2014   CL 96* 12/25/2014   CO2 22 12/25/2014   EKG: normal sinus rhythm, 1st degree AV block.  Stress Echo: MIld MR, AI, TR, PR No AS, MS Inferior Posterior Hypokinesis EF > 55%  Anesthesia Physical Anesthesia Plan  ASA: IV  Anesthesia Plan: General   Post-op Pain Management:    Induction: Intravenous  Airway Management Planned: Oral ETT  Additional Equipment: Arterial line, PA Cath, TEE, CVP and Ultrasound Guidance Line Placement  Intra-op Plan:   Post-operative Plan: Post-operative intubation/ventilation  Informed Consent: I have reviewed the patients History and Physical, chart, labs and  discussed the procedure including the risks, benefits and alternatives for the proposed anesthesia with the patient or authorized representative who has indicated his/her understanding and acceptance.   Dental advisory given  Plan Discussed with:   Anesthesia Plan Comments: (Anesthetic plan discussed in detail. Associated risk discussed including but not limited to life threatening cardiovascular, pulmonary events and dental damage. The postoperative pain management and antiemetic plan discussed with patient. All questions answered in detail. Patient is in agreement.   )        Anesthesia Quick Evaluation

## 2014-12-28 NOTE — OR Nursing (Signed)
One hour call to SICU charge nurse at [phone].

## 2014-12-28 NOTE — H&P (Signed)
[address]       ,[place] [address]             [phone]                    Jacob Reyes Record #[id number] Date of Birth: Sep 25, 1925  Referring:Dr Faith Primary Care: Rusty Aus., MD  Chief Complaint:    angina.   History of Present Illness:    Jacob Reyes 81 y.o. male is seen in the office ,referral from  Dr. Emily Filbert and Dr Kyra Searles for evaluation of exercise stress echo that was read as showing inferior posterior ischemia and recent cardiac cath. Patient has episodic pain on the right side of his chest. It feels like gas pains. He also has low energy and fatigue. He notes "indig[facility] .He has no prior cardiac history. He is status post partial colectomy due to colon cancer unknown stage. He has been treated with aspirin on lovastatin.  He denies syncope presyncope orthopnea or PND.    Current Activity/ Functional Status:  Patient is independent with mobility/ambulation, transfers, ADL's, IADL's.   Zubrod Score: At the time of surgery this patient's most appropriate activity status/level should be described as: []     0    Normal activity, no symptoms [x]     1    Restricted in physical strenuous activity but ambulatory, able to do out light work []     2    Ambulatory and capable of self care, unable to do work activities, up and about               >50 % of waking hours                              []     3    Only limited self care, in bed greater than 50% of waking hours []     4    Completely disabled, no self care, confined to bed or chair []     5    Moribund   Past Medical History  Diagnosis Date  . Glaucoma   . Hearing loss   . Reflux   . GERD (gastroesophageal reflux disease)   . Cancer     prostate  . Colon cancer   . Coronary artery disease   . Hypertension   . Abnormal stress test   . Depression   . Anxiety     just started med. for anxiety   .  History of hiatal hernia     Past Surgical History  Procedure Laterality Date  . Colon surgery    . Cardiac catheterization N/A 12/10/2014    Procedure: Left Heart Cath and Coronary Angiography;  Surgeon: Teodoro Spray, MD;  Location: [facility];  Service: Cardiovascular;  Laterality: N/A;  . Foot surgery Right 2009    Shorten metatarsal  . Hernia repair Bilateral 1991    inguinal   . Tonsillectomy    . Eye surgery      catarcats removed bilataeral /w IOL    No family history on file.  History   Social History  . Marital Status: Married    Spouse Name: N/A  . Number of Children: N/A  . Years of Education: N/A   Occupational History  . Not on file.  Social History Main Topics  . Smoking status: Never Smoker   . Smokeless tobacco: Never Used  . Alcohol Use: No  . Drug Use: No  . Sexual Activity: Not on file   Other Topics Concern  . Not on file   Social History Narrative    History  Smoking status  . Never Smoker   Smokeless tobacco  . Never Used    History  Alcohol Use No     No Known Allergies  Current Facility-Administered Medications  Medication Dose Route Frequency Provider Last Rate Last Dose  . aminocaproic acid (AMICAR) 10 g in sodium chloride 0.9 % 100 mL infusion   Intravenous To OR [name], MD      . cefUROXime (ZINACEF) 1.5 g in dextrose 5 % 50 mL IVPB  1.5 g Intravenous To OR [name], MD      . cefUROXime (ZINACEF) 750 mg in dextrose 5 % 50 mL IVPB  750 mg Intravenous To OR [name], MD      . chlorhexidine (HIBICLENS) 4 % liquid 2 application  30 mL Topical UD [name], MD      . dexmedetomidine (PRECEDEX) 400 MCG/100ML (4 mcg/mL) infusion  0.1-0.7 mcg/kg/hr Intravenous To OR [name], MD      . DOPamine (INTROPIN) 800 mg in dextrose 5 % 250 mL (3.2 mg/mL) infusion  0-10 mcg/kg/min Intravenous To OR [name], MD      . EPINEPHrine (ADRENALIN) 4 mg in dextrose 5 % 250 mL  (0.016 mg/mL) infusion  0-10 mcg/min Intravenous To OR [name], MD      . heparin 2,500 Units, papaverine 30 mg in electrolyte-148 (PLASMALYTE-148) 500 mL irrigation   Irrigation To OR [name], MD      . heparin 30,000 units/NS 1000 mL solution for CELLSAVER   Other To OR [name], MD      . insulin regular (NOVOLIN R,HUMULIN R) 250 Units in sodium chloride 0.9 % 250 mL (1 Units/mL) infusion   Intravenous To OR [name], MD      . magnesium sulfate (IV Push/IM) injection 40 mEq  40 mEq Other To OR [name], MD      . metoprolol tartrate (LOPRESSOR) tablet 12.5 mg  12.5 mg Oral To SS-Surg [name], MD      . nitroGLYCERIN 50 mg in dextrose 5 % 250 mL (0.2 mg/mL) infusion  2-200 mcg/min Intravenous To OR [name], MD      . phenylephrine (NEO-SYNEPHRINE) 20 mg in dextrose 5 % 250 mL (0.08 mg/mL) infusion  30-200 mcg/min Intravenous To OR [name], MD      . potassium chloride injection 80 mEq  80 mEq Other To OR [name], MD      . vancomycin (VANCOCIN) 1,500 mg in sodium chloride 0.9 % 250 mL IVPB  1,500 mg Intravenous To OR [name], MD          Review of Systems:     Cardiac Review of Systems: Y or N  Chest Pain [  y  ]  Resting SOB [  n ] Exertional SOB  [ y ]  Orthopnea [  n]   Pedal Edema [ y left >right  ]    Palpitations [ n ] Syncope  [  ]   Presyncope [  n ]  General Review of Systems: [Y] = yes [  ]=  no Constitional: recent weight change [n  ];  Wt loss over the last 3 months [   ] anorexia [  ]; fatigue [  ]; nausea [  ]; night sweats [  ]; fever [n  ]; or chills [  ];          Dental: poor dentition[ n ]; Last Dentist visit:   Eye : blurred vision [  ]; diplopia [   ]; vision changes [  ];  Amaurosis fugax[  ]; Resp: cough [  ];  wheezing[n  ];  hemoptysis[n  ]; shortness of breath[n  ]; paroxysmal nocturnal dyspnea[ n ]; dyspnea on exertion[ y ]; or orthopnea[  ];  GI:  gallstones[  ],  vomiting[  ];  dysphagia[  ]; melena[  ];  hematochezia [  ]; heartburn[  ];   Hx of  Colonoscopy[ 2013 ]; GU: kidney stones [  ]; hematuria[  ];   dysuria [  ];  nocturia[  ];  history of     obstruction [  ]; urinary frequency [  ]             Skin: rash, swelling[  ];, hair loss[  ];  peripheral edema[  ];  or itching[  ]; Musculosketetal: myalgias[  ];  joint swelling[  ];  joint erythema[  ];  joint pain[  ];  back pain[  ];  Heme/Lymph: bruising[  ];  bleeding[  ];  anemia[  ];  Neuro: TIA[ n ];  headaches[ n ];  stroke[ n ];  vertigo[ n ];  seizures[  n];   paresthesias[  n];  difficulty walking[n  ];  Psych:depression[  ]; anxiety[  ];  Endocrine: diabetes[n  ];  thyroid dysfunction[  ];  Immunizations: Flu up to date Blue.Reese  ]; Pneumococcal up to date [ y ];  Other:  Physical Exam: BP 196/105 mmHg  Pulse 59  Temp(Src) 97.8 F (36.6 C) (Oral)  Resp 20  SpO2 100%  PHYSICAL EXAMINATION: General appearance: alert, cooperative, appears younger than stated age and no distress Head: Normocephalic, without obvious abnormality, atraumatic Neck: no adenopathy, no carotid bruit, no JVD, supple, symmetrical, trachea midline and thyroid not enlarged, symmetric, no tenderness/mass/nodules Lymph nodes: Cervical, supraclavicular, and axillary nodes normal. Resp: clear to auscultation bilaterally Back: symmetric, no curvature. ROM normal. No CVA tenderness. Cardio: regular rate and rhythm, S1, S2 normal, no murmur, click, rub or gallop GI: soft, non-tender; bowel sounds normal; no masses,  no organomegaly Extremities: extremities normal, atraumatic, no cyanosis  and Homans sign is negative, no sign of DVT, both ankles are arthritic the left leg below the knee is more swollen than the right. The right second toe is surgically amputated. It's difficult to palpate pedal pulses due to the edema. We'll obtain vein mapping prior to surgery, but currently the right leg looks preferable for use for  bypass. Neurologic: Grossly normal  Diagnostic Studies & Laboratory data:   Cath: [place]- Dr Kyra Searles Conclusion     LM lesion, 40% stenosed.  Ost Ramus lesion, 70% stenosed.  Prox Cx lesion, 80% stenosed.  Mid LAD lesion, 80% stenosed.  Prox RCA lesion, 100% stenosed.  1st Diag lesion, 70% stenosed.  Prox LAD lesion, 70% stenosed.     Coronary Findings    Dominance: Right   Left Main   . LM lesion, 30% stenosed.   . LM lesion, 40% stenosed.     Left Anterior Descending   . Prox LAD lesion,  70% stenosed.   . Mid LAD lesion, 80% stenosed.   . First Diagonal Branch   . 1st Diag lesion, 70% stenosed.     Ramus Intermedius   . Ost Ramus lesion, 70% stenosed.     Left Circumflex   . Prox Cx lesion, 80% stenosed.     Right Coronary Artery   . Prox RCA lesion, 100% stenosed.   . Right Posterior Descending Artery   RPDA filled by collaterals from Dist LAD.           I have independently reviewed the above  cath films and reviewed the findings with the  patient .    Stress echo : mild MR,AR,TR,PR no as or ms evidence of inferior ischemia   Recent Radiology Findings:   Dg Chest 2 View  12/25/2014   CLINICAL DATA:  Preoperative exam prior to surgery, history of coronary artery disease and colonic malignancy  EXAM: CHEST  2 VIEW  COMPARISON:  PA and lateral chest x-ray of November 03, 2007  FINDINGS: The lungs are adequately inflated. There is no focal infiltrate. There is no pleural effusion. The interstitial markings are coarse but stable. The heart and pulmonary vascularity are normal. There is tortuosity of the descending thoracic aorta. There is moderate dextrocurvature of the mid thoracic spine with multilevel degenerative disc disease.  IMPRESSION: There is no acute cardiopulmonary abnormality. There are chronic bronchitic changes.   Electronically Signed   By: David  Martinique M.D.   On: 12/25/2014 13:28       Recent Lab Findings: Lab Results  Component  Value Date   WBC 10.7* 12/25/2014   HGB 16.1 12/25/2014   HCT 45.7 12/25/2014   PLT 164 12/25/2014   GLUCOSE 98 12/28/2014   ALT 14* 12/25/2014   AST 20 12/25/2014   NA 131* 12/28/2014   K 4.4 12/28/2014   CL 96* 12/28/2014   CREATININE 0.86 12/28/2014   BUN 11 12/28/2014   CO2 28 12/28/2014   INR 1.21 12/25/2014   HGBA1C 5.7* 12/25/2014      Assessment / Plan:   Symtopatic 81 yo male with out significant comomorbities with the exception of age with 3 vessel cad. With his physiologic age less then his chronologic age I recommended proceeding with coronary artery bypass grafting. We discussed other treatment options include medical which could improve symptoms but with 3 vessel disease is unlikely to prolong his life or improve his functional status. Anatomically angioplasty would not be suitable. The risks and options of surgery discussed with he has wife and family in detail and he is willing to proceed.   The goals risks and alternatives of the planned surgical procedure CABG have been discussed with the patient in detail. The risks of the procedure including death, infection, stroke, myocardial infarction, bleeding, blood transfusion have all been discussed specifically.  I have quoted Jacob Reyes a 5 % of perioperative mortality and a complication rate as high as 30 %. The patient's questions have been answered.Jacob Reyes is willing  to proceed with the planned procedure.  [name] MD      [address] [place] [address] Office [phone]   Beeper [phone]  12/28/2014 7:23 AM

## 2014-12-29 ENCOUNTER — Encounter (HOSPITAL_COMMUNITY): Payer: Self-pay | Admitting: Cardiothoracic Surgery

## 2014-12-29 ENCOUNTER — Inpatient Hospital Stay (HOSPITAL_COMMUNITY): Payer: Medicare Other

## 2014-12-29 LAB — POCT I-STAT 3, ART BLOOD GAS (G3+)
Acid-base deficit: 4 mmol/L — ABNORMAL HIGH (ref 0.0–2.0)
Acid-base deficit: 4 mmol/L — ABNORMAL HIGH (ref 0.0–2.0)
Bicarbonate: 20.7 mEq/L (ref 20.0–24.0)
Bicarbonate: 20.2 mEq/L (ref 20.0–24.0)
O2 Saturation: 96 %
O2 Saturation: 93 %
Patient temperature: 36.7
Patient temperature: 36.5
TCO2: 22 mmol/L (ref 0–100)
TCO2: 21 mmol/L (ref 0–100)
pCO2 arterial: 32.5 mmHg — ABNORMAL LOW (ref 35.0–45.0)
pCO2 arterial: 31.2 mmHg — ABNORMAL LOW (ref 35.0–45.0)
pH, Arterial: 7.411 (ref 7.350–7.450)
pH, Arterial: 7.418 (ref 7.350–7.450)
pO2, Arterial: 78 mmHg — ABNORMAL LOW (ref 80.0–100.0)
pO2, Arterial: 64 mmHg — ABNORMAL LOW (ref 80.0–100.0)

## 2014-12-29 LAB — PREPARE FRESH FROZEN PLASMA
Unit division: 0
Unit division: 0
Unit division: 0
Unit division: 0

## 2014-12-29 LAB — CBC
HCT: 24.8 % — ABNORMAL LOW (ref 39.0–52.0)
HEMATOCRIT: 23.5 % — AB (ref 39.0–52.0)
Hemoglobin: 9.1 g/dL — ABNORMAL LOW (ref 13.0–17.0)
Hemoglobin: 8.4 g/dL — ABNORMAL LOW (ref 13.0–17.0)
MCH: 31.3 pg (ref 26.0–34.0)
MCH: 31 pg (ref 26.0–34.0)
MCHC: 36.7 g/dL — ABNORMAL HIGH (ref 30.0–36.0)
MCHC: 35.7 g/dL (ref 30.0–36.0)
MCV: 85.2 fL (ref 78.0–100.0)
MCV: 86.7 fL (ref 78.0–100.0)
Platelets: 111 K/uL — ABNORMAL LOW (ref 150–400)
Platelets: 98 10*3/uL — ABNORMAL LOW (ref 150–400)
RBC: 2.91 MIL/uL — ABNORMAL LOW (ref 4.22–5.81)
RBC: 2.71 MIL/uL — AB (ref 4.22–5.81)
RDW: 14.7 % (ref 11.5–15.5)
RDW: 15.1 % (ref 11.5–15.5)
WBC: 10.2 K/uL (ref 4.0–10.5)
WBC: 11.5 10*3/uL — AB (ref 4.0–10.5)

## 2014-12-29 LAB — GLUCOSE, CAPILLARY
Glucose-Capillary: 107 mg/dL — ABNORMAL HIGH (ref 65–99)
Glucose-Capillary: 108 mg/dL — ABNORMAL HIGH (ref 65–99)
Glucose-Capillary: 112 mg/dL — ABNORMAL HIGH (ref 65–99)
Glucose-Capillary: 117 mg/dL — ABNORMAL HIGH (ref 65–99)
Glucose-Capillary: 119 mg/dL — ABNORMAL HIGH (ref 65–99)
Glucose-Capillary: 131 mg/dL — ABNORMAL HIGH (ref 65–99)
Glucose-Capillary: 134 mg/dL — ABNORMAL HIGH (ref 65–99)
Glucose-Capillary: 138 mg/dL — ABNORMAL HIGH (ref 65–99)
Glucose-Capillary: 169 mg/dL — ABNORMAL HIGH (ref 65–99)
Glucose-Capillary: 187 mg/dL — ABNORMAL HIGH (ref 65–99)
Glucose-Capillary: 77 mg/dL (ref 65–99)
Glucose-Capillary: 83 mg/dL (ref 65–99)
Glucose-Capillary: 87 mg/dL (ref 65–99)
Glucose-Capillary: 92 mg/dL (ref 65–99)

## 2014-12-29 LAB — POCT I-STAT, CHEM 8
BUN: 13 mg/dL (ref 6–20)
Calcium, Ion: 1.11 mmol/L — ABNORMAL LOW (ref 1.13–1.30)
Chloride: 95 mmol/L — ABNORMAL LOW (ref 101–111)
Creatinine, Ser: 0.6 mg/dL — ABNORMAL LOW (ref 0.61–1.24)
Glucose, Bld: 139 mg/dL — ABNORMAL HIGH (ref 65–99)
HCT: 23 % — ABNORMAL LOW (ref 39.0–52.0)
Hemoglobin: 7.8 g/dL — ABNORMAL LOW (ref 13.0–17.0)
Potassium: 4.4 mmol/L (ref 3.5–5.1)
Sodium: 130 mmol/L — ABNORMAL LOW (ref 135–145)
TCO2: 20 mmol/L (ref 0–100)

## 2014-12-29 LAB — BASIC METABOLIC PANEL
ANION GAP: 6 (ref 5–15)
BUN: 11 mg/dL (ref 6–20)
CO2: 23 mmol/L (ref 22–32)
Calcium: 7.3 mg/dL — ABNORMAL LOW (ref 8.9–10.3)
Chloride: 101 mmol/L (ref 101–111)
Creatinine, Ser: 0.72 mg/dL (ref 0.61–1.24)
GFR calc non Af Amer: >60 mL/min (ref >60)
GLUCOSE: 77 mg/dL (ref 65–99)
POTASSIUM: 4.3 mmol/L (ref 3.5–5.1)
SODIUM: 130 mmol/L — AB (ref 135–145)

## 2014-12-29 LAB — CREATININE, SERUM: Creatinine, Ser: 0.88 mg/dL (ref 0.61–1.24)

## 2014-12-29 LAB — PREPARE PLATELET PHERESIS
Unit division: 0
Unit division: 0

## 2014-12-29 LAB — MAGNESIUM
MAGNESIUM: 2.4 mg/dL (ref 1.7–2.4)
Magnesium: 2.3 mg/dL (ref 1.7–2.4)

## 2014-12-29 LAB — BLOOD PRODUCT ORDER (VERBAL) VERIFICATION

## 2014-12-29 LAB — PREPARE RBC (CROSSMATCH)

## 2014-12-29 MED ORDER — SODIUM CHLORIDE 0.9 % IV SOLN
Freq: Once | INTRAVENOUS | Status: AC
  Administered 2014-12-29: 01:00:00 via INTRAVENOUS

## 2014-12-29 MED ORDER — ALBUMIN HUMAN 5 % IV SOLN
12.5000 g | Freq: Once | INTRAVENOUS | Status: AC
  Administered 2014-12-29: 12.5 g via INTRAVENOUS
  Filled 2014-12-29: qty 250

## 2014-12-29 MED ORDER — INSULIN ASPART 100 UNIT/ML ~~LOC~~ SOLN
0.0000 [IU] | SUBCUTANEOUS | Status: DC
  Administered 2014-12-29: 4 [IU] via SUBCUTANEOUS
  Administered 2014-12-29 – 2014-12-30 (×4): 2 [IU] via SUBCUTANEOUS

## 2014-12-29 MED ORDER — INSULIN ASPART 100 UNIT/ML ~~LOC~~ SOLN: 0.0000 [IU] | SUBCUTANEOUS | Status: DC

## 2014-12-29 MED ORDER — LATANOPROST 0.005 % OP SOLN
1.0000 [drp] | Freq: Every day | OPHTHALMIC | Status: DC
  Administered 2014-12-29 – 2015-01-03 (×6): 1 [drp] via OPHTHALMIC
  Filled 2014-12-29: qty 2.5

## 2014-12-29 MED ORDER — METOPROLOL TARTRATE 12.5 MG HALF TABLET
12.5000 mg | ORAL_TABLET | Freq: Two times a day (BID) | ORAL | Status: DC
  Administered 2015-01-02 – 2015-01-03 (×3): 12.5 mg via ORAL
  Filled 2014-12-29 (×12): qty 1

## 2014-12-29 MED FILL — Mannitol IV Soln 20%: INTRAVENOUS | Qty: 500 | Status: AC

## 2014-12-29 MED FILL — Sodium Chloride IV Soln 0.9%: INTRAVENOUS | Qty: 2000 | Status: AC

## 2014-12-29 MED FILL — Heparin Sodium (Porcine) Inj 1000 Unit/ML: INTRAMUSCULAR | Qty: 10 | Status: AC

## 2014-12-29 MED FILL — Lidocaine HCl IV Inj 20 MG/ML: INTRAVENOUS | Qty: 5 | Status: AC

## 2014-12-29 MED FILL — Sodium Bicarbonate IV Soln 8.4%: INTRAVENOUS | Qty: 50 | Status: AC

## 2014-12-29 MED FILL — Electrolyte-R (PH 7.4) Solution: INTRAVENOUS | Qty: 6000 | Status: AC

## 2014-12-29 NOTE — Care Management Note (Signed)
Case Management Note  Patient Details  Name: Jacob Reyes MRN: [id number] Date of Birth: [birth year]  Subjective/Objective:     Lives at home with wife - independent prior, but is HOH.  Wife and grandson at bedside assisting.  Plan for discharge is home with wife and family, children and grandchidlren will all be assisting and taking turns.                 Action/Plan:   Expected Discharge Date:                  Expected Discharge Plan:  [name]  In-House Referral:     Discharge planning Services     Post Acute Care Choice:    Choice offered to:     DME Arranged:    DME Agency:     HH Arranged:    [place] Agency:     Status of Service:  In process, will continue to follow  Medicare Important Message Given:    Date Medicare IM Given:    Medicare IM give by:    Date Additional Medicare IM Given:    Additional Medicare Important Message give by:     If discussed at [facility] of Stay Meetings, dates discussed:    Additional Comments:  Vergie Living, RN 12/29/2014, 2:01 PM

## 2014-12-29 NOTE — Progress Notes (Signed)
[facility] BRIEF SICU PROGRESS NOTE  1 Day Post-Op  S/P Procedure(s) (LRB): CORONARY ARTERY BYPASS GRAFTING (CABG), ON PUMP, TIMES SIX, USING LEFT INTERNAL MAMMARY ARTERY, RIGHT GREATER SAPHENOUS VEIN HARVESTED ENDOSCOPICALLY (N/A) TRANSESOPHAGEAL ECHOCARDIOGRAM (TEE) (N/A)   Stable day NSR w/ PAC's Stable hemodynamics but still on low dose Epi and Dopamine O2 sats 98% UOP 20-25 mL/hr Labs okay  Plan: Continue current plan  Rexene Alberts 12/29/2014 6:25 PM

## 2014-12-29 NOTE — Procedures (Signed)
Extubation Procedure Note  Patient Details:   Name: Jacob Reyes DOB: 02-Feb-1926 MRN: [id number]   Airway Documentation:  Airway 8 mm (Active)  Secured at (cm) 22 cm 12/29/2014 12:12 AM  Measured From Lips 12/29/2014 12:12 AM  Secured Location Right 12/29/2014 12:12 AM  Secured By Rana Snare Tape 12/29/2014 12:12 AM  Site Condition Dry 12/29/2014 12:12 AM    Evaluation  O2 sats: stable throughout Complications: No apparent complications Patient did tolerate procedure well. Bilateral Breath Sounds: Clear, Diminished Suctioning: Airway Yes  Patient extubated with no complications and placed on 2LNC humidified. Patient has a strong cough and is able to speak. Vitals are stable. Blanchie Serve 12/29/2014, 2:18 AM

## 2014-12-29 NOTE — Progress Notes (Signed)
MD at bedside; continue current plan, keep femoral line in as of now, will continue to monitor and try to wean drips as able.  Rowe Pavy, RN

## 2014-12-29 NOTE — Progress Notes (Signed)
NIF -40, VC 2L

## 2014-12-29 NOTE — Progress Notes (Addendum)
[address]       [place] [address]             [phone]        1 Day Post-Op Procedure(s) (LRB): CORONARY ARTERY BYPASS GRAFTING (CABG), ON PUMP, TIMES SIX, USING LEFT INTERNAL MAMMARY ARTERY, RIGHT GREATER SAPHENOUS VEIN HARVESTED ENDOSCOPICALLY (N/A) TRANSESOPHAGEAL ECHOCARDIOGRAM (TEE) (N/A)  Subjective: Patient states it is hard to breathe and he has incisional pain.  Objective: Vital signs in last 24 hours: Temp:  [95 F (35 C)-98.8 F (37.1 C)] 98.1 F (36.7 C) (08/09 0845) Pulse Rate:  [51-104] 76 (08/09 0845) Cardiac Rhythm:  [-] Normal sinus rhythm (08/09 0730) Resp:  [12-23] 19 (08/09 0845) BP: (67-116)/(40-69) 72/40 mmHg (08/09 0830) SpO2:  [93 %-100 %] 98 % (08/09 0845) Arterial Line BP: (56-154)/(43-84) 114/58 mmHg (08/09 0845) FiO2 (%):  [40 %-50 %] 40 % (08/09 0126) Weight:  [191 lb 12.8 oz (87 kg)-204 lb 12.9 oz (92.9 kg)] 204 lb 12.9 oz (92.9 kg) (08/09 0500)  Pre op weight 87 kg Current Weight  12/29/14 204 lb 12.9 oz (92.9 kg)    Hemodynamic parameters for last 24 hours: PAP: (12-34)/(4-25) 19/8 mmHg CO:  [2 L/min-3.8 L/min] 3.8 L/min CI:  [1 L/min/m2-1.8 L/min/m2] 1.8 L/min/m2  Intake/Output from previous day: 08/08 0701 - 08/09 0700 In: 10955.5 [I.V.:4239.5; HALPF:7902; NG/GT:30; IV IOXBDZHGD:9242] Out: 6834 [Urine:4160; Drains:65; Blood:2750; Chest Tube:840]   Physical Exam:  Cardiovascular: RRR. Pulmonary: Diminished at bases; no rales, wheezes, or rhonchi. Abdomen: Soft, non tender, bowel sounds present. Extremities: Bilateral lower extremity edema R>L. Bloody ooze from JP drain. Ecchymosis right thigh and posterior calf Wounds: Aquacel intact. RLE dressings are clean and dry.  Lab Results: CBC: Recent Labs  12/28/14 2245 12/29/14 0520  WBC 11.9* 10.2  HGB 7.7* 9.1*  HCT 21.8* 24.8*  PLT 130* 111*   BMET:  Recent Labs  12/28/14 0631  12/28/14 2243 12/28/14 2245 12/29/14 0520  NA 131*  < >  131*  --  130*  K 4.4  < > 3.3*  --  4.3  CL 96*  < > 98*  --  101  CO2 28  --   --   --  23  GLUCOSE 98  < > 162*  --  77  BUN 11  < > 9  --  11  CREATININE 0.86  < > 0.60* 0.77 0.72  CALCIUM 8.8*  --   --   --  7.3*  < > = values in this interval not displayed.  PT/INR:  Lab Results  Component Value Date   INR 1.85* 12/28/2014   INR 1.80* 12/28/2014   INR 2.18* 12/28/2014   ABG:  INR: Will add last result for INR, ABG once components are confirmed Will add last 4 CBG results once components are confirmed  Assessment/Plan:  1. CV - SR in the 90'sand hypotensive. Will hold Lopressor 12.5 mg bid. On Dopamine and Epinephrine drips. Will give Albumin 2.  Pulmonary - Chest tubes with 1090 cc since surgery (150 cc since 7 am).To remain for now. CXR small bilateral pleural effusions, no pneumothorax. Encourage incentive spirometer 3. Volume Overload - Will need Lasix once BP improves. 4.  Acute blood loss anemia - H and H stable at 9.1 and 24.8 5. Thrombocytopenia-platelets slightly decreased at 111,000 6. CBGs 77/92/83. Pre op HGA1C 5.7. He is pre diabetic. Will stop SS and accu checks once transferred to [place] [facility]  12/29/2014,9:11 AM  I have seen and examined Jacob Reyes and agree with the above assessment  and plan.  [name] MD [place] [phone] Office [phone] 12/29/2014 6:43 PM

## 2014-12-30 ENCOUNTER — Inpatient Hospital Stay (HOSPITAL_COMMUNITY): Payer: Medicare Other

## 2014-12-30 LAB — CBC
HCT: 23.9 % — ABNORMAL LOW (ref 39.0–52.0)
Hemoglobin: 8.5 g/dL — ABNORMAL LOW (ref 13.0–17.0)
MCH: 31.1 pg (ref 26.0–34.0)
MCHC: 35.6 g/dL (ref 30.0–36.0)
MCV: 87.5 fL (ref 78.0–100.0)
Platelets: 101 10*3/uL — ABNORMAL LOW (ref 150–400)
RBC: 2.73 MIL/uL — ABNORMAL LOW (ref 4.22–5.81)
RDW: 15.3 % (ref 11.5–15.5)
WBC: 13.3 10*3/uL — ABNORMAL HIGH (ref 4.0–10.5)

## 2014-12-30 LAB — GLUCOSE, CAPILLARY
Glucose-Capillary: 128 mg/dL — ABNORMAL HIGH (ref 65–99)
Glucose-Capillary: 129 mg/dL — ABNORMAL HIGH (ref 65–99)
Glucose-Capillary: 129 mg/dL — ABNORMAL HIGH (ref 65–99)
Glucose-Capillary: 137 mg/dL — ABNORMAL HIGH (ref 65–99)
Glucose-Capillary: 139 mg/dL — ABNORMAL HIGH (ref 65–99)
Glucose-Capillary: 151 mg/dL — ABNORMAL HIGH (ref 65–99)
Glucose-Capillary: 156 mg/dL — ABNORMAL HIGH (ref 65–99)

## 2014-12-30 LAB — COMPREHENSIVE METABOLIC PANEL
ALT: 10 U/L — ABNORMAL LOW (ref 17–63)
AST: 25 U/L (ref 15–41)
Albumin: 3 g/dL — ABNORMAL LOW (ref 3.5–5.0)
Alkaline Phosphatase: 38 U/L (ref 38–126)
Anion gap: 6 (ref 5–15)
BUN: 16 mg/dL (ref 6–20)
CO2: 23 mmol/L (ref 22–32)
Calcium: 7.8 mg/dL — ABNORMAL LOW (ref 8.9–10.3)
Chloride: 100 mmol/L — ABNORMAL LOW (ref 101–111)
Creatinine, Ser: 0.84 mg/dL (ref 0.61–1.24)
GFR calc Af Amer: >60 mL/min (ref >60)
GFR calc non Af Amer: >60 mL/min (ref >60)
Glucose, Bld: 133 mg/dL — ABNORMAL HIGH (ref 65–99)
Potassium: 4.3 mmol/L (ref 3.5–5.1)
Sodium: 129 mmol/L — ABNORMAL LOW (ref 135–145)
Total Bilirubin: 1.6 mg/dL — ABNORMAL HIGH (ref 0.3–1.2)
Total Protein: 5.3 g/dL — ABNORMAL LOW (ref 6.5–8.1)

## 2014-12-30 LAB — TSH: TSH: 3.385 u[IU]/mL (ref 0.350–4.500)

## 2014-12-30 MED ORDER — AMIODARONE HCL IN DEXTROSE 360-4.14 MG/200ML-% IV SOLN
30.0000 mg/h | INTRAVENOUS | Status: DC
  Administered 2014-12-31: 30 mg/h via INTRAVENOUS
  Filled 2014-12-30 (×10): qty 200

## 2014-12-30 MED ORDER — FUROSEMIDE 10 MG/ML IJ SOLN: 40.0000 mg | Freq: Once | INTRAMUSCULAR | Status: DC

## 2014-12-30 MED ORDER — AMIODARONE HCL 200 MG PO TABS
200.0000 mg | ORAL_TABLET | Freq: Every day | ORAL | Status: DC
  Administered 2015-01-07 – 2015-01-11 (×5): 200 mg via ORAL
  Filled 2014-12-30 (×5): qty 1

## 2014-12-30 MED ORDER — AMIODARONE HCL IN DEXTROSE 360-4.14 MG/200ML-% IV SOLN: 60.0000 mg/h | INTRAVENOUS | Status: AC

## 2014-12-30 MED ORDER — AMIODARONE HCL IN DEXTROSE 360-4.14 MG/200ML-% IV SOLN
60.0000 mg/h | INTRAVENOUS | Status: DC
  Administered 2014-12-30: 60 mg/h via INTRAVENOUS
  Filled 2014-12-30: qty 200

## 2014-12-30 MED ORDER — INSULIN ASPART 100 UNIT/ML ~~LOC~~ SOLN
0.0000 [IU] | SUBCUTANEOUS | Status: DC
  Administered 2014-12-30 – 2014-12-31 (×6): 2 [IU] via SUBCUTANEOUS
  Administered 2014-12-31: 4 [IU] via SUBCUTANEOUS
  Administered 2014-12-31 – 2015-01-03 (×3): 2 [IU] via SUBCUTANEOUS

## 2014-12-30 MED ORDER — AMIODARONE HCL IN DEXTROSE 360-4.14 MG/200ML-% IV SOLN
INTRAVENOUS | Status: AC
  Filled 2014-12-30: qty 200

## 2014-12-30 MED ORDER — FUROSEMIDE 10 MG/ML IJ SOLN
40.0000 mg | Freq: Once | INTRAMUSCULAR | Status: AC
Start: 2014-12-30 — End: 2014-12-30
  Administered 2014-12-30: 40 mg via INTRAVENOUS
  Filled 2014-12-30: qty 4

## 2014-12-30 MED ORDER — AMIODARONE HCL 200 MG PO TABS
200.0000 mg | ORAL_TABLET | Freq: Two times a day (BID) | ORAL | Status: AC
  Administered 2014-12-31 – 2015-01-06 (×13): 200 mg via ORAL
  Filled 2014-12-30 (×14): qty 1

## 2014-12-30 MED ORDER — INSULIN ASPART 100 UNIT/ML ~~LOC~~ SOLN: 0.0000 [IU] | SUBCUTANEOUS | Status: DC

## 2014-12-30 MED ORDER — AMIODARONE HCL IN DEXTROSE 360-4.14 MG/200ML-% IV SOLN
30.0000 mg/h | INTRAVENOUS | Status: DC
  Filled 2014-12-30: qty 200

## 2014-12-30 MED ORDER — AMIODARONE LOAD VIA INFUSION
150.0000 mg | Freq: Once | INTRAVENOUS | Status: AC
  Administered 2014-12-30: 150 mg via INTRAVENOUS

## 2014-12-30 NOTE — Progress Notes (Signed)
      [address]       [place] [address]             [phone]       Visiting with family  Alert and no complaints  BP 104/59 mmHg  Pulse 71  Temp(Src) 98.1 F (36.7 C) (Oral)  Resp 21  Ht 6\' 1"  (1.854 m)  Wt 205 lb 14.6 oz (93.4 kg)  BMI 27.17 kg/m2  SpO2 96% Currently in atrial fib with rate 100- 110  Intake/Output Summary (Last 24 hours) at 12/30/14 1811 Last data filed at 12/30/14 1800  Gross per 24 hour  Intake 1949.22 ml  Output   1575 ml  Net 374.22 ml   On epi and dopamine  Continue current care  Sarahbeth Cashin C. Roxan Hockey, MD [facility] [phone]

## 2014-12-30 NOTE — Progress Notes (Signed)
Pt sustaining afib 140's- 150's.  Pt asymptomatic.  Dr. Roxy Manns notified and orders recieved.  Amio protocol initiated. I will continue to monitor

## 2014-12-30 NOTE — Progress Notes (Addendum)
Patient ID: Jacob Reyes, male   DOB: 1925-09-06, 81 y.o.   MRN: [id number] TCTS DAILY ICU PROGRESS NOTE                   [address]            [place] [address]          [phone]   2 Days Post-Op Procedure(s) (LRB): CORONARY ARTERY BYPASS GRAFTING (CABG), ON PUMP, TIMES SIX, USING LEFT INTERNAL MAMMARY ARTERY, RIGHT GREATER SAPHENOUS VEIN HARVESTED ENDOSCOPICALLY (N/A) TRANSESOPHAGEAL ECHOCARDIOGRAM (TEE) (N/A)  Total Length of Stay:  LOS: 2 days   Subjective: Awake and neuro intact  Objective: Vital signs in last 24 hours: Temp:  [97.7 F (36.5 C)-98.5 F (36.9 C)] 97.8 F (36.6 C) (08/10 0803) Pulse Rate:  [69-113] 95 (08/10 0800) Cardiac Rhythm:  [-] Normal sinus rhythm (08/10 0800) Resp:  [11-26] 25 (08/10 0800) BP: (82-127)/(36-82) 121/77 mmHg (08/10 0800) SpO2:  [95 %-100 %] 97 % (08/10 0800) Arterial Line BP: (71-147)/(22-144) 141/81 mmHg (08/10 0800) Weight:  [205 lb 14.6 oz (93.4 kg)] 205 lb 14.6 oz (93.4 kg) (08/10 0500)  Filed Weights   12/28/14 1940 12/29/14 0500 12/30/14 0500  Weight: 191 lb 12.8 oz (87 kg) 204 lb 12.9 oz (92.9 kg) 205 lb 14.6 oz (93.4 kg)    Weight change: 14 lb 1.8 oz (6.4 kg)   Hemodynamic parameters for last 24 hours: PAP: (14-33)/(1-21) 32/20 mmHg CO:  [5.1 L/min-5.3 L/min] 5.1 L/min CI:  [2.3 L/min/m2-2.5 L/min/m2] 2.3 L/min/m2  Intake/Output from previous day: 08/09 0701 - 08/10 0700 In: 2378.3 [P.O.:960; I.V.:1068.3; IV Piggyback:350] Out: 1275 [Urine:730; Drains:15; Chest Tube:530]  Intake/Output this shift: Total I/O In: 84.5 [I.V.:84.5] Out: 105 [Urine:65; Chest Tube:40]  Current Meds: Scheduled Meds: . sodium chloride   Intravenous Once  . acetaminophen  1,000 mg Oral 4 times per day   Or  . acetaminophen (TYLENOL) oral liquid 160 mg/5 mL  1,000 mg Per Tube 4 times per day  . antiseptic oral rinse  7 mL Mouth Rinse QID  . aspirin EC  325 mg Oral Daily   Or  . aspirin  324 mg Per Tube  Daily  . bisacodyl  10 mg Oral Daily   Or  . bisacodyl  10 mg Rectal Daily  . brimonidine  1 drop Both Eyes BID   And  . timolol  1 drop Both Eyes BID  . cefUROXime (ZINACEF)  IV  1.5 g Intravenous Q12H  . chlorhexidine gluconate  15 mL Mouth Rinse BID  . docusate sodium  200 mg Oral Daily  . finasteride  5 mg Oral QPC supper  . insulin aspart  0-24 Units Subcutaneous 6 times per day  . latanoprost  1 drop Both Eyes QHS  . magnesium chloride  2 tablet Oral Daily  . metoprolol tartrate  12.5 mg Oral BID  . pantoprazole  40 mg Oral Daily  . pravastatin  40 mg Oral q1800  . sodium chloride  3 mL Intravenous Q12H  . tamsulosin  0.4 mg Oral BID   Continuous Infusions: . sodium chloride 10 mL/hr at 12/29/14 2000  . sodium chloride 250 mL (12/29/14 0400)  . sodium chloride Stopped (12/28/14 1804)  . amiodarone 60 mg/hr (12/30/14 2353)   Followed by  . amiodarone    . dexmedetomidine Stopped (12/29/14 0100)  . DOPamine 3 mcg/kg/min (12/30/14 0800)  . epinephrine 7 mcg/min (12/30/14 0800)  . lactated ringers    .  lactated ringers 20 mL/hr at 12/30/14 0400  . nitroGLYCERIN Stopped (12/28/14 1700)  . phenylephrine (NEO-SYNEPHRINE) Adult infusion Stopped (12/29/14 0000)   PRN Meds:.sodium chloride, lactated ringers, metoprolol, midazolam, morphine injection, ondansetron (ZOFRAN) IV, oxyCODONE, sodium chloride, traMADol  General appearance: alert, cooperative, appears stated age and no distress Neurologic: intact Heart: regular rate and rhythm, S1, S2 normal, no murmur, click, rub or gallop Lungs: diminished breath sounds bibasilar Abdomen: soft, non-tender; bowel sounds normal; no masses,  no organomegaly Extremities: extremities normal, atraumatic, no cyanosis or edema  Lab Results: CBC: Recent Labs  12/29/14 1700 12/30/14 0400  WBC 11.5* 13.3*  HGB 8.4* 8.5*  HCT 23.5* 23.9*  PLT 98* 101*   BMET:  Recent Labs  12/29/14 0520 12/29/14 1618 12/29/14 1700 12/30/14 0400    NA 130* 130*  --  129*  K 4.3 4.4  --  4.3  CL 101 95*  --  100*  CO2 23  --   --  23  GLUCOSE 77 139*  --  133*  BUN 11 13  --  16  CREATININE 0.72 0.60* 0.88 0.84  CALCIUM 7.3*  --   --  7.8*    PT/INR:  Recent Labs  12/28/14 2245  LABPROT 21.3*  INR 1.85*   Radiology: Dg Chest Port 1 View  12/30/2014   CLINICAL DATA:  Status post CABG on December 28, 2014  EXAM: PORTABLE CHEST - 1 VIEW  COMPARISON:  Portable chest x-ray of December 29, 2014  FINDINGS: The lungs are reasonably well inflated. There is persistent bibasilar atelectasis and small pleural effusion. The cardiac silhouette remains mildly enlarged. The pulmonary vascularity is slightly less conspicuous today. The Swan-Ganz catheter is been removed. The 2 left-sided chest tubes are unchanged in position. The right internal jugular Cordis sheath tip projects over the proximal SVC.  IMPRESSION: Allowing for differences in inflation of the lungs there has not been significant interval change since yesterday's study. There is persistent bibasilar atelectasis with small pleural effusions. The pulmonary vascularity is slightly less engorged. The remaining support tubes are in reasonable position.   Electronically Signed   By: David  Martinique M.D.   On: 12/30/2014 07:46     Assessment/Plan: S/P Procedure(s) (LRB): CORONARY ARTERY BYPASS GRAFTING (CABG), ON PUMP, TIMES SIX, USING LEFT INTERNAL MAMMARY ARTERY, RIGHT GREATER SAPHENOUS VEIN HARVESTED ENDOSCOPICALLY (N/A) TRANSESOPHAGEAL ECHOCARDIOGRAM (TEE) (N/A) Mobilize Continue foley due to strict I&O, patient in ICU and urinary output monitoring See progression orders On cordarone for afib started last pm    [name] 12/30/2014 8:54 AM

## 2014-12-30 NOTE — Progress Notes (Signed)
  Amiodarone Drug - Drug Interaction Consult Note  Recommendations: No significant drug interaction requires therapy modification.  Amiodarone is metabolized by the cytochrome P450 system and therefore has the potential to cause many drug interactions. Amiodarone has an average plasma half-life of 50 days (range 20 to 100 days).   There is potential for drug interactions to occur several weeks or months after stopping treatment and the onset of drug interactions may be slow after initiating amiodarone.   [x]  Statins: Increased risk of myopathy. Simvastatin- restrict dose to 20mg  daily.              Patient is on Pravastatin 40 mg daily, minor CYP 3A4 substrate.   Other statins: counsel patients to report any muscle pain or weakness immediately.  []  Anticoagulants: Amiodarone can increase anticoagulant effect. Consider warfarin dose reduction. Patients should be monitored closely and the dose of anticoagulant altered accordingly, remembering that amiodarone levels take several weeks to stabilize.  []  Antiepileptics: Amiodarone can increase plasma concentration of phenytoin, the dose should be reduced. Note that small changes in phenytoin dose can result in large changes in levels. Monitor patient and counsel on signs of toxicity.  []  Beta blockers: increased risk of bradycardia, AV block and myocardial depression. Sotalol - avoid concomitant use.  []   Calcium channel blockers (diltiazem and verapamil): increased risk of bradycardia, AV block and myocardial depression.  []   Cyclosporine: Amiodarone increases levels of cyclosporine. Reduced dose of cyclosporine is recommended.  []  Digoxin dose should be halved when amiodarone is started.  []  Diuretics: increased risk of cardiotoxicity if hypokalemia occurs.  []  Oral hypoglycemic agents (glyburide, glipizide, glimepiride): increased risk of hypoglycemia. Patient's glucose levels should be monitored closely when initiating amiodarone therapy.    []  Drugs that prolong the QT interval:  Torsades de pointes risk may be increased with concurrent use - avoid if possible.  Monitor QTc, also keep magnesium/potassium WNL if concurrent therapy can't be avoided. Marland Kitchen Antibiotics: e.g. fluoroquinolones, erythromycin. . Antiarrhythmics: e.g. quinidine, procainamide, disopyramide, sotalol. . Antipsychotics: e.g. phenothiazines, haloperidol.  . Lithium, tricyclic antidepressants, and methadone. Thank You,   Maryanna Shape, PharmD, BCPS  Clinical Pharmacist  Pager: [phone]   12/30/2014 10:03 AM

## 2014-12-31 ENCOUNTER — Inpatient Hospital Stay (HOSPITAL_COMMUNITY): Payer: Medicare Other

## 2014-12-31 LAB — COMPREHENSIVE METABOLIC PANEL
ALT: 10 U/L — ABNORMAL LOW (ref 17–63)
AST: 19 U/L (ref 15–41)
Albumin: 2.6 g/dL — ABNORMAL LOW (ref 3.5–5.0)
Alkaline Phosphatase: 47 U/L (ref 38–126)
Anion gap: 5 (ref 5–15)
BUN: 13 mg/dL (ref 6–20)
CO2: 26 mmol/L (ref 22–32)
Calcium: 7.7 mg/dL — ABNORMAL LOW (ref 8.9–10.3)
Chloride: 99 mmol/L — ABNORMAL LOW (ref 101–111)
Creatinine, Ser: 0.77 mg/dL (ref 0.61–1.24)
GFR calc Af Amer: >60 mL/min (ref >60)
GFR calc non Af Amer: >60 mL/min (ref >60)
Glucose, Bld: 126 mg/dL — ABNORMAL HIGH (ref 65–99)
Potassium: 3.8 mmol/L (ref 3.5–5.1)
Sodium: 130 mmol/L — ABNORMAL LOW (ref 135–145)
Total Bilirubin: 0.9 mg/dL (ref 0.3–1.2)
Total Protein: 4.5 g/dL — ABNORMAL LOW (ref 6.5–8.1)

## 2014-12-31 LAB — CBC
HCT: 23.3 % — ABNORMAL LOW (ref 39.0–52.0)
Hemoglobin: 8.2 g/dL — ABNORMAL LOW (ref 13.0–17.0)
MCH: 31.4 pg (ref 26.0–34.0)
MCHC: 35.2 g/dL (ref 30.0–36.0)
MCV: 89.3 fL (ref 78.0–100.0)
Platelets: 109 10*3/uL — ABNORMAL LOW (ref 150–400)
RBC: 2.61 MIL/uL — ABNORMAL LOW (ref 4.22–5.81)
RDW: 15.3 % (ref 11.5–15.5)
WBC: 12.9 10*3/uL — ABNORMAL HIGH (ref 4.0–10.5)

## 2014-12-31 LAB — GLUCOSE, CAPILLARY
Glucose-Capillary: 123 mg/dL — ABNORMAL HIGH (ref 65–99)
Glucose-Capillary: 142 mg/dL — ABNORMAL HIGH (ref 65–99)
Glucose-Capillary: 114 mg/dL — ABNORMAL HIGH (ref 65–99)
Glucose-Capillary: 162 mg/dL — ABNORMAL HIGH (ref 65–99)
Glucose-Capillary: 140 mg/dL — ABNORMAL HIGH (ref 65–99)

## 2014-12-31 LAB — MAGNESIUM: Magnesium: 1.9 mg/dL (ref 1.7–2.4)

## 2014-12-31 LAB — PREPARE RBC (CROSSMATCH)

## 2014-12-31 MED ORDER — SODIUM CHLORIDE 0.9 % IV SOLN
Freq: Once | INTRAVENOUS | Status: AC
  Administered 2014-12-31: 20:00:00 via INTRAVENOUS

## 2014-12-31 MED ORDER — ENOXAPARIN SODIUM 30 MG/0.3ML ~~LOC~~ SOLN
30.0000 mg | SUBCUTANEOUS | Status: DC
  Administered 2014-12-31 – 2015-01-01 (×2): 30 mg via SUBCUTANEOUS
  Filled 2014-12-31 (×3): qty 0.3

## 2014-12-31 MED ORDER — LIDOCAINE HCL (PF) 1 % IJ SOLN
5.0000 mL | Freq: Once | INTRAMUSCULAR | Status: AC
  Administered 2014-12-31: 4 mL via INTRADERMAL
  Filled 2014-12-31: qty 5

## 2014-12-31 MED ORDER — LIDOCAINE HCL (CARDIAC) 20 MG/ML IV SOLN
INTRAVENOUS | Status: AC
  Filled 2014-12-31: qty 5

## 2014-12-31 NOTE — Progress Notes (Signed)
Patient ID: Jacob Reyes, male   DOB: July 27, 1925, 81 y.o.   MRN: [id number] TCTS DAILY ICU PROGRESS NOTE                   [address]            [place] [address]          [phone]   3 Days Post-Op Procedure(s) (LRB): CORONARY ARTERY BYPASS GRAFTING (CABG), ON PUMP, TIMES SIX, USING LEFT INTERNAL MAMMARY ARTERY, RIGHT GREATER SAPHENOUS VEIN HARVESTED ENDOSCOPICALLY (N/A) TRANSESOPHAGEAL ECHOCARDIOGRAM (TEE) (N/A)  Total Length of Stay:  LOS: 3 days   Subjective: Awake and alert, up in chair, complains of feeling weak   Objective: Vital signs in last 24 hours: Temp:  [97.5 F (36.4 C)-98.8 F (37.1 C)] 97.8 F (36.6 C) (08/11 0834) Pulse Rate:  [71-139] 87 (08/11 0800) Cardiac Rhythm:  [-] Normal sinus rhythm (08/11 0800) Resp:  [19-26] 20 (08/11 0800) BP: (67-146)/(44-90) 111/64 mmHg (08/11 0800) SpO2:  [93 %-100 %] 98 % (08/11 0800) Arterial Line BP: (72-242)/(44-239) 114/58 mmHg (08/11 0800) Weight:  [212 lb 11.9 oz (96.5 kg)] 212 lb 11.9 oz (96.5 kg) (08/11 0600)  Filed Weights   12/29/14 0500 12/30/14 0500 12/31/14 0600  Weight: 204 lb 12.9 oz (92.9 kg) 205 lb 14.6 oz (93.4 kg) 212 lb 11.9 oz (96.5 kg)    Weight change: 6 lb 13.4 oz (3.1 kg)   Hemodynamic parameters for last 24 hours:    Intake/Output from previous day: 08/10 0701 - 08/11 0700 In: 1793.6 [I.V.:1793.6] Out: 2315 [Urine:1955; Chest Tube:360]  Intake/Output this shift: Total I/O In: 70.3 [I.V.:70.3] Out: 165 [Urine:35; Chest Tube:130]  Current Meds: Scheduled Meds: . sodium chloride   Intravenous Once  . acetaminophen  1,000 mg Oral 4 times per day   Or  . acetaminophen (TYLENOL) oral liquid 160 mg/5 mL  1,000 mg Per Tube 4 times per day  . amiodarone  200 mg Oral Q12H   Followed by  . [START ON 01/07/2015] amiodarone  200 mg Oral Daily  . aspirin EC  325 mg Oral Daily   Or  . aspirin  324 mg Per Tube Daily  . bisacodyl  10 mg Oral Daily   Or  . bisacodyl  10  mg Rectal Daily  . brimonidine  1 drop Both Eyes BID   And  . timolol  1 drop Both Eyes BID  . docusate sodium  200 mg Oral Daily  . enoxaparin (LOVENOX) injection  30 mg Subcutaneous Q24H  . finasteride  5 mg Oral QPC supper  . furosemide  40 mg Intravenous Once  . insulin aspart  0-24 Units Subcutaneous 6 times per day  . latanoprost  1 drop Both Eyes QHS  . magnesium chloride  2 tablet Oral Daily  . metoprolol tartrate  12.5 mg Oral BID  . pantoprazole  40 mg Oral Daily  . pravastatin  40 mg Oral q1800  . sodium chloride  3 mL Intravenous Q12H  . tamsulosin  0.4 mg Oral BID   Continuous Infusions: . sodium chloride 10 mL/hr at 12/29/14 2000  . sodium chloride 250 mL (12/29/14 0400)  . sodium chloride Stopped (12/28/14 1804)  . amiodarone 30 mg/hr (12/31/14 0600)  . dexmedetomidine Stopped (12/29/14 0100)  . DOPamine 5 mcg/kg/min (12/31/14 0800)  . epinephrine 6 mcg/min (12/31/14 0800)  . lactated ringers    . lactated ringers 20 mL/hr at 12/31/14 0600  . nitroGLYCERIN Stopped (  12/28/14 1700)  . phenylephrine (NEO-SYNEPHRINE) Adult infusion Stopped (12/29/14 0000)   PRN Meds:.sodium chloride, lactated ringers, metoprolol, morphine injection, ondansetron (ZOFRAN) IV, oxyCODONE, sodium chloride, traMADol  General appearance: alert, cooperative and no distress Neurologic: intact Heart: regular rate and rhythm, S1, S2 normal, no murmur, click, rub or gallop Lungs: diminished breath sounds bibasilar Abdomen: soft, non-tender; bowel sounds normal; no masses,  no organomegaly Extremities: right vein harvest leg swollen but not "tight" Wound: sternum intact  Lab Results: CBC: Recent Labs  12/30/14 0400 12/31/14 0420  WBC 13.3* 12.9*  HGB 8.5* 8.2*  HCT 23.9* 23.3*  PLT 101* 109*   BMET:  Recent Labs  12/30/14 0400 12/31/14 0420  NA 129* 130*  K 4.3 3.8  CL 100* 99*  CO2 23 26  GLUCOSE 133* 126*  BUN 16 13  CREATININE 0.84 0.77  CALCIUM 7.8* 7.7*    PT/INR:    Recent Labs  12/28/14 2245  LABPROT 21.3*  INR 1.85*   Radiology: Dg Chest Port 1 View  12/31/2014   CLINICAL DATA:  Coronary artery disease.  Status post CABG.  EXAM: PORTABLE CHEST - 1 VIEW  COMPARISON:  12/30/2014, 12/29/2014, 12/28/2014 and 12/25/2014  FINDINGS: Heart size and pulmonary vascularity are normal. No pneumothorax. Left chest tube and central venous sheath are unchanged.  Resolution of atelectasis the left base. Improved slight atelectasis and small effusion at the right base.  IMPRESSION: No pneumothorax. Left lung is clear. Improved atelectasis and small effusion at the right base.   Electronically Signed   By: Lorriane Shire M.D.   On: 12/31/2014 08:00     Assessment/Plan: S/P Procedure(s) (LRB): CORONARY ARTERY BYPASS GRAFTING (CABG), ON PUMP, TIMES SIX, USING LEFT INTERNAL MAMMARY ARTERY, RIGHT GREATER SAPHENOUS VEIN HARVESTED ENDOSCOPICALLY (N/A) TRANSESOPHAGEAL ECHOCARDIOGRAM (TEE) (N/A) Mobilize d/c tubes/lines See progression orders Pt to see Back in sinus on po Cordarone  hgb stable weaning off drips   Jacob Reyes 12/31/2014 8:40 AM

## 2014-12-31 NOTE — Evaluation (Signed)
Physical Therapy Evaluation Patient Details Name: Jacob Reyes MRN: [id number] DOB: 07/17/1925 Today's Date: 12/31/2014   History of Present Illness  Adm 8/8 and underwent CABG x 6; post-op afib and hypotension requiring pressors; 8/11 AM found on floor by nsg  PMHx- colon Ca; bil HOH; HTN; anxiety  Clinical Impression  Patient is s/p above surgery resulting in functional limitations due to the deficits listed below (see PT Problem List). Pt currently extremely weak in standing (?orthostatic with knees slowly buckling--denied dizziness). Remains on low dose pressors. Pt very active PTA and feel if BP stabilizes, he should progress well and be able to d/c home with wifes's assist. Patient will benefit from skilled PT to increase their independence and safety with mobility to allow discharge to the venue listed below.       Follow Up Recommendations Home health PT;Supervision/Assistance - 24 hour    Equipment Recommendations  Rolling walker with 5" wheels    Recommendations for Other Services OT consult     Precautions / Restrictions Precautions Precautions: Sternal;Fall Restrictions Weight Bearing Restrictions:  (sternal precautions)      Mobility  Bed Mobility               General bed mobility comments: return to bed via maxi-sky  Transfers Overall transfer level: Needs assistance Equipment used: Pushed w/c Transfers: Sit to/from Stand Sit to Stand: Mod assist;+2 physical assistance;+2 safety/equipment;From elevated surface (pillow in seat of recliner)         General transfer comment: stood for ~45 seconds with difficulty fully extending knees (slightly crouched posture) holding onto locked w/c; attempted to shift weight and "march" with each knee slowly buckling and pt lowered back down to edge of chair. Pt reclined and +3 scooted up into chair with no injury. Maxi-sky pad placed with nursing assist and lifted back to bed  Ambulation/Gait              General Gait Details: unable   Stairs            Wheelchair Mobility    Modified Rankin (Stroke Patients Only)       Balance Overall balance assessment: Needs assistance Sitting-balance support: No upper extremity supported;Feet supported Sitting balance-Leahy Scale: Fair     Standing balance support: Bilateral upper extremity supported Standing balance-Leahy Scale: Poor Standing balance comment: initial posterior lean; crouched                             Pertinent Vitals/Pain HR103-106 throughout BP MAP 74 (sit); after stand and in recliner (82)   Pain Assessment: Faces Faces Pain Scale: Hurts little more Pain Location: chest; RLE Pain Intervention(s): Limited activity within patient's tolerance;Monitored during session;Repositioned    Home Living Family/patient expects to be discharged to:: Private residence Living Arrangements: Spouse/significant other Available Help at Discharge: Family;Available 24 hours/day Type of Home: House Home Access: Stairs to enter Entrance Stairs-Rails: Right Entrance Stairs-Number of Steps: 3 Home Layout: One level Home Equipment: None      Prior Function Level of Independence: Independent         Comments: works out at [employer]        Extremity/Trunk Assessment   Upper Extremity Assessment:  (WFL with sternal precautions)           Lower Extremity Assessment: RLE deficits/detail;LLE deficits/detail RLE Deficits / Details: 5/5 quads and DF with testing, however unable to fully extend  knees in standing and legs buckled with attempt to "march" LLE Deficits / Details: 5/5 quads and DF with testing, however unable to fully extend knees in standing and legs buckled with attempt to "march"  Cervical / Trunk Assessment: Kyphotic  Communication   Communication: HOH  Cognition Arousal/Alertness: Awake/alert Behavior During Therapy: WFL for tasks assessed/performed Overall Cognitive Status:  Within Functional Limits for tasks assessed                      General Comments      Exercises        Assessment/Plan    PT Assessment Patient needs continued PT services  PT Diagnosis Difficulty walking;Generalized weakness;Acute pain   PT Problem List Decreased strength;Decreased activity tolerance;Decreased balance;Decreased mobility;Decreased knowledge of use of DME;Decreased knowledge of precautions;Cardiopulmonary status limiting activity;Pain  PT Treatment Interventions DME instruction;Gait training;Stair training;Functional mobility training;Therapeutic activities;Therapeutic exercise;Balance training;Patient/family education   PT Goals (Current goals can be found in the Care Plan section) Acute Rehab PT Goals Patient Stated Goal: regain his strength "i'm so weak" PT Goal Formulation: With patient Time For Goal Achievement: 01/07/15 Potential to Achieve Goals: Good    Frequency Min 3X/week   Barriers to discharge        Co-evaluation               End of Session Equipment Utilized During Treatment: Gait belt;Oxygen Activity Tolerance: Treatment limited secondary to medical complications (Comment) (?hypotension when standing (denied dizziness, on pressors)) Patient left: in bed;with call bell/phone within reach;with nursing/sitter in room Nurse Communication: Mobility status;Need for lift equipment;Other (comment) (knees buckled, near fall)         Time: 1000-1026 PT Time Calculation (min) (ACUTE ONLY): 26 min   Charges:   PT Evaluation $Initial PT Evaluation Tier I: 1 Procedure PT Treatments $Gait Training: 8-22 mins   PT G Codes:        Wally Shevchenko 2015/01/08, 10:49 AM  Pager [phone]

## 2014-12-31 NOTE — Progress Notes (Signed)
OT Cancellation Note  Patient Details Name: Jacob Reyes MRN: [id number] DOB: 04-Oct-1925   Cancelled Treatment:    Reason Eval/Treat Not Completed: Other (comment).  Pt states that he just feels too weak right now for any activity.  Reviewed sternal precautions.  Will check back tomorrow.  Erina Hamme 12/31/2014, 3:25 PM  Lesle Chris, OTR/L [phone] 12/31/2014

## 2014-12-31 NOTE — Care Management Important Message (Signed)
Important Message  Patient Details  Name: BLAS RICHES MRN: [id number] Date of Birth: 1925/07/27   Medicare Important Message Given:  Yes-second notification given    Nathen May 12/31/2014, 2:46 [place] Message  Patient Details  Name: BALTAZAR PEKALA MRN: [id number] Date of Birth: 07-11-1925   Medicare Important Message Given:  Yes-second notification given    Nathen May 12/31/2014, 2:46 PM

## 2014-12-31 NOTE — Progress Notes (Signed)
Patient ID: Jacob Reyes, male   DOB: [birth year], 81 y.o.   MRN: [id number]  SICU Evening Rounds:  Borderline BP on epi 9 mcg and dop 5.  Hgb 8.2 so transfusing  Urine output ok  BMET    Component Value Date/Time   NA 130* 12/31/2014 0420   K 3.8 12/31/2014 0420   CL 99* 12/31/2014 0420   CO2 26 12/31/2014 0420   GLUCOSE 126* 12/31/2014 0420   BUN 13 12/31/2014 0420   CREATININE 0.77 12/31/2014 0420   CALCIUM 7.7* 12/31/2014 0420   GFRNONAA >60 12/31/2014 0420   GFRAA >60 12/31/2014 0420    CBC    Component Value Date/Time   WBC 12.9* 12/31/2014 0420   RBC 2.61* 12/31/2014 0420   HGB 8.2* 12/31/2014 0420   HCT 23.3* 12/31/2014 0420   PLT 109* 12/31/2014 0420   MCV 89.3 12/31/2014 0420   MCH 31.4 12/31/2014 0420   MCHC 35.2 12/31/2014 0420   RDW 15.3 12/31/2014 0420    A/P:   Borderline hemodynamics. Transfuse and then wean epi and dop as tolerated.

## 2014-12-31 NOTE — Progress Notes (Signed)
   12/31/14 0625  What Happened  Was fall witnessed? No  Was patient injured? Yes  Patient found on floor  Found by Staff-comment (Zayonna Ayuso)  Stated prior activity other (comment) (sitting in chair)  Follow Up  MD notified dr Roxan Hockey  Time MD notified [phone]  Family notified Yes-comment (wife Letta Median)  Time family notified [phone]  Additional tests No  Simple treatment Dressing (needs sutures from incision dehisence)  Adult Fall Risk Assessment  Risk Factor Category (scoring not indicated) High fall risk per protocol (document High fall risk)  Patient's Fall Risk High Fall Risk (>13 points)  Adult Fall Risk Interventions  Required Bundle Interventions *See Row Information* High fall risk - low, moderate, and high requirements implemented  Additional Interventions Fall risk signage;Secure all tubes/drains;Room near nurses station  Fall with Injury Screening  Risk For Fall Injury- See Row Information  E  Vitals  BP (!) 146/90 mmHg  MAP (mmHg) 98  Pulse Rate (!) 106  ECG Heart Rate (!) 106  Resp 19  Oxygen Therapy  SpO2 95 %  O2 Device Room Air  Pain Assessment  Pain Assessment No/denies pain  PCA/Epidural/Spinal Assessment  Respiratory Pattern Regular;Unlabored  Neurological  Neuro (WDL) WDL  Level of Consciousness Alert  Orientation Level Oriented X4  Cognition Follows commands  R Pupil Size (mm) 2  R Pupil Shape Round  R Pupil Reaction Brisk  L Pupil Size (mm) 2  L Pupil Shape Round  L Pupil Reaction Brisk  Motor Function/Sensation Assessment Grip;Dorsiflexion;Plantar flexion;Motor response;Motor strength  R Hand Grip Present;Moderate  L Hand Grip Present;Moderate   R Foot Dorsiflexion Present;Moderate  L Foot Dorsiflexion Present;Moderate  R Foot Plantar Flexion Present;Moderate  L Foot Plantar Flexion Present;Moderate  RUE Motor Response Responds to commands;Purposeful movement  RUE Motor Strength 4  LUE Motor Response Responds to commands;Purposeful  movement  LUE Motor Strength 4  RLE Motor Response Responds to commands;Purposeful movement  RLE Motor Strength 4  LLE Motor Response Responds to commands;Purposeful movement  LLE Motor Strength 4  Neuro Symptoms None  Glasgow Coma Scale  Eye Opening 4  Best Verbal Response (NON-intubated) 5  Best Motor Response 6  Glasgow Coma Scale Score 15  Musculoskeletal  Musculoskeletal (WDL) X  Generalized Weakness Yes  Weight Bearing Restrictions (sternal precautions)  Integumentary  Skin Integrity Dehisced  Dehisced Location Knee  Dehisced Location Orientation Right  Dehisced Intervention Cleansed (redressed)

## 2015-01-01 ENCOUNTER — Inpatient Hospital Stay (INDEPENDENT_AMBULATORY_CARE_PROVIDER_SITE_OTHER): Payer: Medicare Other

## 2015-01-01 ENCOUNTER — Inpatient Hospital Stay (HOSPITAL_COMMUNITY): Payer: Medicare Other

## 2015-01-01 DIAGNOSIS — I509 Heart failure, unspecified: Secondary | ICD-10-CM

## 2015-01-01 LAB — TYPE AND SCREEN
ABO/RH(D): B POS
Antibody Screen: NEGATIVE
Unit division: 0
Unit division: 0
Unit division: 0
Unit division: 0
Unit division: 0
Unit division: 0
Unit division: 0
Unit division: 0

## 2015-01-01 LAB — CBC
HCT: 26.8 % — ABNORMAL LOW (ref 39.0–52.0)
Hemoglobin: 9.5 g/dL — ABNORMAL LOW (ref 13.0–17.0)
MCH: 31.3 pg (ref 26.0–34.0)
MCHC: 35.4 g/dL (ref 30.0–36.0)
MCV: 88.2 fL (ref 78.0–100.0)
Platelets: 120 10*3/uL — ABNORMAL LOW (ref 150–400)
RBC: 3.04 MIL/uL — ABNORMAL LOW (ref 4.22–5.81)
RDW: 15.6 % — ABNORMAL HIGH (ref 11.5–15.5)
WBC: 10.7 10*3/uL — ABNORMAL HIGH (ref 4.0–10.5)

## 2015-01-01 LAB — BASIC METABOLIC PANEL
Anion gap: 4 — ABNORMAL LOW (ref 5–15)
BUN: 12 mg/dL (ref 6–20)
CO2: 27 mmol/L (ref 22–32)
Calcium: 7.9 mg/dL — ABNORMAL LOW (ref 8.9–10.3)
Chloride: 101 mmol/L (ref 101–111)
Creatinine, Ser: 0.75 mg/dL (ref 0.61–1.24)
GFR calc Af Amer: >60 mL/min (ref >60)
GFR calc non Af Amer: >60 mL/min (ref >60)
Glucose, Bld: 110 mg/dL — ABNORMAL HIGH (ref 65–99)
Potassium: 4 mmol/L (ref 3.5–5.1)
Sodium: 132 mmol/L — ABNORMAL LOW (ref 135–145)

## 2015-01-01 LAB — GLUCOSE, CAPILLARY
Glucose-Capillary: 109 mg/dL — ABNORMAL HIGH (ref 65–99)
Glucose-Capillary: 112 mg/dL — ABNORMAL HIGH (ref 65–99)
Glucose-Capillary: 114 mg/dL — ABNORMAL HIGH (ref 65–99)
Glucose-Capillary: 119 mg/dL — ABNORMAL HIGH (ref 65–99)
Glucose-Capillary: 119 mg/dL — ABNORMAL HIGH (ref 65–99)
Glucose-Capillary: 122 mg/dL — ABNORMAL HIGH (ref 65–99)

## 2015-01-01 MED ORDER — BISACODYL 10 MG RE SUPP: 10.0000 mg | Freq: Once | RECTAL | Status: AC

## 2015-01-01 MED ORDER — SODIUM CHLORIDE 0.9 % IJ SOLN
10.0000 mL | Freq: Two times a day (BID) | INTRAMUSCULAR | Status: DC
  Administered 2015-01-01 – 2015-01-03 (×4): 10 mL
  Administered 2015-01-03: 20 mL
  Administered 2015-01-04: 10 mL

## 2015-01-01 MED ORDER — NOREPINEPHRINE BITARTRATE 1 MG/ML IV SOLN
2.0000 ug/min | INTRAVENOUS | Status: DC
  Administered 2015-01-01: 2 ug/min via INTRAVENOUS
  Administered 2015-01-02 (×2): 9 ug/min via INTRAVENOUS
  Administered 2015-01-02: 8.5 ug/min via INTRAVENOUS
  Administered 2015-01-03: 6 ug/min via INTRAVENOUS
  Administered 2015-01-03: 7.5 ug/min via INTRAVENOUS
  Administered 2015-01-03: 5 ug/min via INTRAVENOUS
  Filled 2015-01-01 (×7): qty 4

## 2015-01-01 MED ORDER — PERFLUTREN LIPID MICROSPHERE
1.0000 mL | INTRAVENOUS | Status: AC | PRN
  Administered 2015-01-01: 4 mL via INTRAVENOUS
  Filled 2015-01-01: qty 10

## 2015-01-01 MED ORDER — SODIUM CHLORIDE 0.9 % IJ SOLN: 10.0000 mL | INTRAMUSCULAR | Status: DC | PRN

## 2015-01-01 NOTE — Progress Notes (Signed)
PT Cancellation Note  Patient Details Name: Jacob Reyes MRN: [id number] DOB: 1925/12/13   Cancelled Treatment:    Reason Eval/Treat Not Completed: AM--Medical issues which prohibited therapy (hypotension with Levophed initiated and increasing dose); PM--wanted to attempt bed exercises and Patient at procedure or test/unavailable   Shantil Vallejo 01/01/2015, 3:51 PM Pager [phone]

## 2015-01-01 NOTE — Progress Notes (Signed)
Patient ID: Jacob Reyes, male   DOB: 29-Sep-1925, 81 y.o.   MRN: [id number] EVENING ROUNDS NOTE :     [address]       [place], [address]             [phone]                 4 Days Post-Op Procedure(s) (LRB): CORONARY ARTERY BYPASS GRAFTING (CABG), ON PUMP, TIMES SIX, USING LEFT INTERNAL MAMMARY ARTERY, RIGHT GREATER SAPHENOUS VEIN HARVESTED ENDOSCOPICALLY (N/A) TRANSESOPHAGEAL ECHOCARDIOGRAM (TEE) (N/A)  Total Length of Stay:  LOS: 4 days  BP 119/65 mmHg  Pulse 101  Temp(Src) 98.1 F (36.7 C) (Oral)  Resp 20  Ht 6\' 1"  (1.854 m)  Wt 207 lb 3.7 oz (94 kg)  BMI 27.35 kg/m2  SpO2 99%  .Intake/Output      08/11 0701 - 08/12 0700 08/12 0701 - 08/13 0700   I.V. (mL/kg) 1324.2 (14.1) 552.9 (5.9)   Blood 305    Total Intake(mL/kg) 1629.2 (17.3) 552.9 (5.9)   Urine (mL/kg/hr) 2985 (1.3) 95 (0.1)   Drains     Chest Tube 130 (0.1)    Total Output 3115 95   Net -1485.8 +457.9          . sodium chloride 10 mL/hr at 12/29/14 2000  . sodium chloride 250 mL (12/29/14 0400)  . sodium chloride Stopped (12/28/14 1804)  . amiodarone Stopped (12/31/14 1200)  . dexmedetomidine Stopped (12/29/14 0100)  . DOPamine 4 mcg/kg/min (01/01/15 1748)  . epinephrine Stopped (01/01/15 1530)  . lactated ringers    . lactated ringers 20 mL/hr at 12/31/14 0600  . nitroGLYCERIN Stopped (12/28/14 1700)  . norepinephrine (LEVOPHED) Adult infusion 10 mcg/min (01/01/15 1600)  . phenylephrine (NEO-SYNEPHRINE) Adult infusion Stopped (12/29/14 0000)     Lab Results  Component Value Date   WBC 10.7* 01/01/2015   HGB 9.5* 01/01/2015   HCT 26.8* 01/01/2015   PLT 120* 01/01/2015   GLUCOSE 110* 01/01/2015   ALT 10* 12/31/2014   AST 19 12/31/2014   NA 132* 01/01/2015   K 4.0 01/01/2015   CL 101 01/01/2015   CREATININE 0.75 01/01/2015   BUN 12 01/01/2015   CO2 27 01/01/2015   TSH 3.385 12/30/2014   INR 1.85* 12/28/2014   HGBA1C 5.7* 12/25/2014   pic line in  place Voiding In a flutter unable to get converted with rapid apacing  [name] MD  [place] Office [phone] 01/01/2015 6:09 PM

## 2015-01-01 NOTE — Progress Notes (Signed)
Peripherally Inserted Central Catheter/Midline Placement  The IV Nurse has discussed with the patient and/or persons authorized to consent for the patient, the purpose of this procedure and the potential benefits and risks involved with this procedure.  The benefits include less needle sticks, lab draws from the catheter and patient may be discharged home with the catheter.  Risks include, but not limited to, infection, bleeding, blood clot (thrombus formation), and puncture of an artery; nerve damage and irregular heat beat.  Alternatives to this procedure were also discussed.  PICC/Midline Placement Documentation        Darlyn Read 01/01/2015, 5:19 PM

## 2015-01-01 NOTE — Progress Notes (Signed)
  Echocardiogram 2D Echocardiogram has been performed.  Darlina Sicilian M 01/01/2015, 4:32 PM

## 2015-01-01 NOTE — Progress Notes (Signed)
Patient ID: Jacob Reyes, male   DOB: 1925-09-05, 81 y.o.   MRN: [id number] TCTS DAILY ICU PROGRESS NOTE                   [address]            [place] [address]          [phone]   4 Days Post-Op Procedure(s) (LRB): CORONARY ARTERY BYPASS GRAFTING (CABG), ON PUMP, TIMES SIX, USING LEFT INTERNAL MAMMARY ARTERY, RIGHT GREATER SAPHENOUS VEIN HARVESTED ENDOSCOPICALLY (N/A) TRANSESOPHAGEAL ECHOCARDIOGRAM (TEE) (N/A)  Total Length of Stay:  LOS: 4 days   Subjective: Patient in chair, awake and neuro intact, has had some episodes of briefly "passing out " when he stands. Now back in afib  Rate of 110  Objective: Vital signs in last 24 hours: Temp:  [97.5 F (36.4 C)-98.4 F (36.9 C)] 97.6 F (36.4 C) (08/12 0700) Pulse Rate:  [71-112] 106 (08/12 0700) Cardiac Rhythm:  [-] Atrial fibrillation (08/12 0800) Resp:  [15-26] 23 (08/12 0700) BP: (78-140)/(46-87) 84/60 mmHg (08/12 0700) SpO2:  [92 %-100 %] 96 % (08/12 0700) Weight:  [207 lb 3.7 oz (94 kg)] 207 lb 3.7 oz (94 kg) (08/12 0600)  Filed Weights   12/30/14 0500 12/31/14 0600 01/01/15 0600  Weight: 205 lb 14.6 oz (93.4 kg) 212 lb 11.9 oz (96.5 kg) 207 lb 3.7 oz (94 kg)    Weight change: -5 lb 8.2 oz (-2.5 kg)   Hemodynamic parameters for last 24 hours:    Intake/Output from previous day: 08/11 0701 - 08/12 0700 In: 1629.2 [I.V.:1324.2; Blood:305] Out: 3115 [Urine:2985; Chest Tube:130]  Intake/Output this shift: Total I/O In: 43.2 [I.V.:43.2] Out: 35 [Urine:35]  Current Meds: Scheduled Meds: . sodium chloride   Intravenous Once  . acetaminophen  1,000 mg Oral 4 times per day   Or  . acetaminophen (TYLENOL) oral liquid 160 mg/5 mL  1,000 mg Per Tube 4 times per day  . amiodarone  200 mg Oral Q12H   Followed by  . [START ON 01/07/2015] amiodarone  200 mg Oral Daily  . aspirin EC  325 mg Oral Daily   Or  . aspirin  324 mg Per Tube Daily  . bisacodyl  10 mg Oral Daily   Or  . bisacodyl   10 mg Rectal Daily  . brimonidine  1 drop Both Eyes BID   And  . timolol  1 drop Both Eyes BID  . docusate sodium  200 mg Oral Daily  . enoxaparin (LOVENOX) injection  30 mg Subcutaneous Q24H  . finasteride  5 mg Oral QPC supper  . insulin aspart  0-24 Units Subcutaneous 6 times per day  . latanoprost  1 drop Both Eyes QHS  . magnesium chloride  2 tablet Oral Daily  . metoprolol tartrate  12.5 mg Oral BID  . pantoprazole  40 mg Oral Daily  . pravastatin  40 mg Oral q1800  . sodium chloride  3 mL Intravenous Q12H  . tamsulosin  0.4 mg Oral BID   Continuous Infusions: . sodium chloride 10 mL/hr at 12/29/14 2000  . sodium chloride 250 mL (12/29/14 0400)  . sodium chloride Stopped (12/28/14 1804)  . amiodarone Stopped (12/31/14 1200)  . dexmedetomidine Stopped (12/29/14 0100)  . DOPamine 5 mcg/kg/min (01/01/15 0700)  . epinephrine 4 mcg/min (01/01/15 0700)  . lactated ringers    . lactated ringers 20 mL/hr at 12/31/14 0600  . nitroGLYCERIN Stopped (12/28/14 1700)  .  phenylephrine (NEO-SYNEPHRINE) Adult infusion Stopped (12/29/14 0000)   PRN Meds:.sodium chloride, lactated ringers, metoprolol, morphine injection, ondansetron (ZOFRAN) IV, oxyCODONE, sodium chloride, traMADol  General appearance: alert, cooperative, appears stated age and no distress Neurologic: intact Heart: irregularly irregular rhythm Lungs: diminished breath sounds bibasilar Abdomen: normal findings: bowel sounds normal and abnormal findings:  mildly distented, no pain or tenderness Extremities: Homans sign is negative, no sign of DVT Wound: sternum stable, right leg bruised, evh site reclosed yesterrday  intiact now without evidence of infection  Lab Results: CBC: Recent Labs  12/31/14 0420 01/01/15 0515  WBC 12.9* 10.7*  HGB 8.2* 9.5*  HCT 23.3* 26.8*  PLT 109* 120*   BMET:  Recent Labs  12/31/14 0420 01/01/15 0515  NA 130* 132*  K 3.8 4.0  CL 99* 101  CO2 26 27  GLUCOSE 126* 110*  BUN 13 12   CREATININE 0.77 0.75  CALCIUM 7.7* 7.9*    PT/INR: No results for input(s): LABPROT, INR in the last 72 hours. Radiology: Dg Chest Port 1 View  01/01/2015   CLINICAL DATA:  Status post CABG on December 28, 2014  EXAM: PORTABLE CHEST - 1 VIEW  COMPARISON:  Portable chest x-ray of December 31, 2014  FINDINGS: The lungs are less well inflated today. There is mild bibasilar subsegmental atelectasis. There is no significant pleural effusion. The cardiac silhouette is enlarged. The pulmonary vascularity is mildly prominent centrally. There is 7 intact sternal wires. The right internal jugular Cordis sheath is stable.  IMPRESSION: Decreased aeration of both lungs which is due to hypoinflation and accentuated by changes in positioning. There remains mild pulmonary interstitial edema and mild bibasilar atelectasis.   Electronically Signed   By: David  Martinique M.D.   On: 01/01/2015 07:51     Assessment/Plan: S/P Procedure(s) (LRB): CORONARY ARTERY BYPASS GRAFTING (CABG), ON PUMP, TIMES SIX, USING LEFT INTERNAL MAMMARY ARTERY, RIGHT GREATER SAPHENOUS VEIN HARVESTED ENDOSCOPICALLY (N/A) TRANSESOPHAGEAL ECHOCARDIOGRAM (TEE) (N/A) Mobilize Diabetes control d/c foley  Hold on diuresis until BP better Wean off epi and support bp with levaphed Place pic line and d/c introducer Continue Cordarone for afib, may need anticoagulation but currently on dvt prophylaxis dose of lovenox, patient had postop coagulapathy so slow to increase dose yet    [name] 01/01/2015 8:43 AM

## 2015-01-01 NOTE — Progress Notes (Signed)
Utilization review completed.  

## 2015-01-02 ENCOUNTER — Inpatient Hospital Stay (HOSPITAL_COMMUNITY): Payer: Medicare Other

## 2015-01-02 DIAGNOSIS — K219 Gastro-esophageal reflux disease without esophagitis: Secondary | ICD-10-CM | POA: Insufficient documentation

## 2015-01-02 DIAGNOSIS — C189 Malignant neoplasm of colon, unspecified: Secondary | ICD-10-CM

## 2015-01-02 DIAGNOSIS — I959 Hypotension, unspecified: Secondary | ICD-10-CM | POA: Diagnosis not present

## 2015-01-02 DIAGNOSIS — I4891 Unspecified atrial fibrillation: Secondary | ICD-10-CM | POA: Diagnosis not present

## 2015-01-02 DIAGNOSIS — E785 Hyperlipidemia, unspecified: Secondary | ICD-10-CM | POA: Diagnosis present

## 2015-01-02 DIAGNOSIS — Z951 Presence of aortocoronary bypass graft: Secondary | ICD-10-CM

## 2015-01-02 DIAGNOSIS — I9589 Other hypotension: Secondary | ICD-10-CM

## 2015-01-02 LAB — GLUCOSE, CAPILLARY
Glucose-Capillary: 102 mg/dL — ABNORMAL HIGH (ref 65–99)
Glucose-Capillary: 102 mg/dL — ABNORMAL HIGH (ref 65–99)
Glucose-Capillary: 109 mg/dL — ABNORMAL HIGH (ref 65–99)
Glucose-Capillary: 109 mg/dL — ABNORMAL HIGH (ref 65–99)
Glucose-Capillary: 112 mg/dL — ABNORMAL HIGH (ref 65–99)
Glucose-Capillary: 113 mg/dL — ABNORMAL HIGH (ref 65–99)
Glucose-Capillary: 82 mg/dL (ref 65–99)

## 2015-01-02 LAB — BASIC METABOLIC PANEL
Anion gap: 6 (ref 5–15)
BUN: 13 mg/dL (ref 6–20)
CO2: 26 mmol/L (ref 22–32)
Calcium: 7.9 mg/dL — ABNORMAL LOW (ref 8.9–10.3)
Chloride: 99 mmol/L — ABNORMAL LOW (ref 101–111)
Creatinine, Ser: 0.81 mg/dL (ref 0.61–1.24)
GFR calc Af Amer: >60 mL/min (ref >60)
GFR calc non Af Amer: >60 mL/min (ref >60)
Glucose, Bld: 112 mg/dL — ABNORMAL HIGH (ref 65–99)
Potassium: 3.6 mmol/L (ref 3.5–5.1)
Sodium: 131 mmol/L — ABNORMAL LOW (ref 135–145)

## 2015-01-02 LAB — CBC
HCT: 28.2 % — ABNORMAL LOW (ref 39.0–52.0)
Hemoglobin: 9.7 g/dL — ABNORMAL LOW (ref 13.0–17.0)
MCH: 30.9 pg (ref 26.0–34.0)
MCHC: 34.4 g/dL (ref 30.0–36.0)
MCV: 89.8 fL (ref 78.0–100.0)
Platelets: 166 10*3/uL (ref 150–400)
RBC: 3.14 MIL/uL — ABNORMAL LOW (ref 4.22–5.81)
RDW: 16 % — ABNORMAL HIGH (ref 11.5–15.5)
WBC: 9.4 10*3/uL (ref 4.0–10.5)

## 2015-01-02 MED ORDER — ENOXAPARIN SODIUM 40 MG/0.4ML ~~LOC~~ SOLN
40.0000 mg | Freq: Two times a day (BID) | SUBCUTANEOUS | Status: DC
Start: 2015-01-02 — End: 2015-01-04
  Administered 2015-01-02 – 2015-01-03 (×3): 40 mg via SUBCUTANEOUS
  Filled 2015-01-02 (×6): qty 0.4

## 2015-01-02 NOTE — Progress Notes (Signed)
Patient ID: IMER FOXWORTH, male   DOB: November 04, 1925, 81 y.o.   MRN: [id number] TCTS DAILY ICU PROGRESS NOTE                   [address]            [place] [address]          [phone]   5 Days Post-Op Procedure(s) (LRB): CORONARY ARTERY BYPASS GRAFTING (CABG), ON PUMP, TIMES SIX, USING LEFT INTERNAL MAMMARY ARTERY, RIGHT GREATER SAPHENOUS VEIN HARVESTED ENDOSCOPICALLY (N/A) TRANSESOPHAGEAL ECHOCARDIOGRAM (TEE) (N/A)  Total Length of Stay:  LOS: 5 days   Subjective: Awake and alert, neuro intact up in chair , still on low dose dopamine and levaphed  Objective: Vital signs in last 24 hours: Temp:  [97.7 F (36.5 C)-98.5 F (36.9 C)] 97.7 F (36.5 C) (08/13 0815) Pulse Rate:  [33-117] 100 (08/13 0900) Cardiac Rhythm:  [-] Atrial fibrillation (08/13 0800) Resp:  [15-27] 24 (08/13 0900) BP: (73-142)/(22-118) 112/62 mmHg (08/13 0900) SpO2:  [82 %-100 %] 96 % (08/13 0900) Weight:  [205 lb 14.6 oz (93.4 kg)] 205 lb 14.6 oz (93.4 kg) (08/13 0500)  Filed Weights   12/31/14 0600 01/01/15 0600 01/02/15 0500  Weight: 212 lb 11.9 oz (96.5 kg) 207 lb 3.7 oz (94 kg) 205 lb 14.6 oz (93.4 kg)    Weight change: -1 lb 5.2 oz (-0.6 kg)   Hemodynamic parameters for last 24 hours:    Intake/Output from previous day: 08/12 0701 - 08/13 0700 In: 1789.2 [P.O.:420; I.V.:1369.2] Out: 1396 [Urine:1395; Stool:1]  Intake/Output this shift: Total I/O In: 113.6 [I.V.:113.6] Out: 300 [Urine:300]  Current Meds: Scheduled Meds: . sodium chloride   Intravenous Once  . acetaminophen  1,000 mg Oral 4 times per day   Or  . acetaminophen (TYLENOL) oral liquid 160 mg/5 mL  1,000 mg Per Tube 4 times per day  . amiodarone  200 mg Oral Q12H   Followed by  . [START ON 01/07/2015] amiodarone  200 mg Oral Daily  . aspirin EC  325 mg Oral Daily   Or  . aspirin  324 mg Per Tube Daily  . bisacodyl  10 mg Oral Daily   Or  . bisacodyl  10 mg Rectal Daily  . brimonidine  1 drop Both  Eyes BID   And  . timolol  1 drop Both Eyes BID  . docusate sodium  200 mg Oral Daily  . enoxaparin (LOVENOX) injection  30 mg Subcutaneous Q24H  . finasteride  5 mg Oral QPC supper  . insulin aspart  0-24 Units Subcutaneous 6 times per day  . latanoprost  1 drop Both Eyes QHS  . magnesium chloride  2 tablet Oral Daily  . metoprolol tartrate  12.5 mg Oral BID  . pantoprazole  40 mg Oral Daily  . pravastatin  40 mg Oral q1800  . sodium chloride  10-40 mL Intracatheter Q12H  . sodium chloride  3 mL Intravenous Q12H  . tamsulosin  0.4 mg Oral BID   Continuous Infusions: . sodium chloride 10 mL/hr at 12/29/14 2000  . sodium chloride 250 mL (12/29/14 0400)  . sodium chloride Stopped (12/28/14 1804)  . amiodarone Stopped (12/31/14 1200)  . dexmedetomidine Stopped (12/29/14 0100)  . DOPamine 3 mcg/kg/min (01/02/15 0900)  . epinephrine Stopped (01/01/15 1530)  . lactated ringers    . lactated ringers 20 mL/hr at 01/02/15 0900  . nitroGLYCERIN Stopped (12/28/14 1700)  . norepinephrine (LEVOPHED) Adult  infusion 8 mcg/min (01/02/15 0900)  . phenylephrine (NEO-SYNEPHRINE) Adult infusion Stopped (12/29/14 0000)   PRN Meds:.sodium chloride, lactated ringers, metoprolol, morphine injection, ondansetron (ZOFRAN) IV, oxyCODONE, sodium chloride, sodium chloride, traMADol  General appearance: alert, cooperative, appears stated age and no distress Neurologic: intact Heart: irregularly irregular rhythm Lungs: diminished breath sounds bibasilar Abdomen: soft, non-tender; bowel sounds normal; no masses,  no organomegaly Extremities: extremities normal, atraumatic, no cyanosis or edema and Homans sign is negative, no sign of DVT Wound: sternum stable, legs incisions intact  Lab Results: CBC: Recent Labs  01/01/15 0515 01/02/15 0520  WBC 10.7* 9.4  HGB 9.5* 9.7*  HCT 26.8* 28.2*  PLT 120* 166   BMET:  Recent Labs  01/01/15 0515 01/02/15 0520  NA 132* 131*  K 4.0 3.6  CL 101 99*  CO2  27 26  GLUCOSE 110* 112*  BUN 12 13  CREATININE 0.75 0.81  CALCIUM 7.9* 7.9*    PT/INR: No results for input(s): LABPROT, INR in the last 72 hours. Radiology: Dg Chest Port 1 View  01/01/2015   CLINICAL DATA:  PICC line placement  EXAM: PORTABLE CHEST - 1 VIEW  COMPARISON:  01/01/2015 at 0626 hours  FINDINGS: Right arm PICC terminates at the cavoatrial junction.  Small bilateral pleural effusions. No frank interstitial edema. No pneumothorax.  Right IJ venous sheath terminates in the upper SVC.  Heart is top-normal in size. Postsurgical changes related to prior CABG.  IMPRESSION: Right arm PICC terminates at the cavoatrial junction.  Small bilateral pleural effusions.  No pneumothorax.   Electronically Signed   By: Julian Hy M.D.   On: 01/01/2015 17:58   ECHO: done yesterday: Study Conclusions  - Left ventricle: The cavity size was normal. Wall thickness was normal. Systolic function was vigorous. The estimated ejection fraction was in the range of 65% to 70%. Wall motion was normal; there were no regional wall motion abnormalities. Features are consistent with a pseudonormal left ventricular filling pattern, with concomitant abnormal relaxation and increased filling pressure (grade 2 diastolic dysfunction).  Assessment/Plan: S/P Procedure(s) (LRB): CORONARY ARTERY BYPASS GRAFTING (CABG), ON PUMP, TIMES SIX, USING LEFT INTERNAL MAMMARY ARTERY, RIGHT GREATER SAPHENOUS VEIN HARVESTED ENDOSCOPICALLY (N/A) TRANSESOPHAGEAL ECHOCARDIOGRAM (TEE) (N/A) Mobilize Diuresis- limited by BP  Diabetes control See progression orders plt level increasing Still in afib, will increase lovenox doseage carefully with age and previous bleeding problems  Asked cardiology to see compressive plan for afib management NL renal function  Wean drips off as tolerated   [name] 01/02/2015 9:28 AM

## 2015-01-02 NOTE — Consult Note (Signed)
Reason for Consult:   AF  Requesting Physician: Dr Servando Snare Primary Cardiologist Dr Ubaldo Glassing  HPI: This is a 81 y.o. male seen by Dr Ubaldo Glassing in July after the pt had had an abnormal stress echo for complaints of chest pain. Cath 12/10/14 revealed 4 V CAD with preserved LVF. The pt was referred to Dr Servando Snare and on 12/28/14 underwent CABG x 6. Post op he had AF with RVR and Amiodarone was started. He has also had low B/P requiring IV pressors. We are asked to see to help manage AF.   PMHx:  Past Medical History  Diagnosis Date  . Glaucoma   . Hearing loss   . Reflux   . GERD (gastroesophageal reflux disease)   . Cancer     prostate  . Colon cancer   . Coronary artery disease   . Hypertension   . Abnormal stress test   . Depression   . Anxiety     just started med. for anxiety   . History of hiatal hernia     Past Surgical History  Procedure Laterality Date  . Colon surgery    . Cardiac catheterization N/A 12/10/2014    Procedure: Left Heart Cath and Coronary Angiography;  Surgeon: Teodoro Spray, MD;  Location: [facility];  Service: Cardiovascular;  Laterality: N/A;  . Foot surgery Right 2009    Shorten metatarsal  . Hernia repair Bilateral 1991    inguinal   . Tonsillectomy    . Eye surgery      catarcats removed bilataeral /w IOL  . Coronary artery bypass graft N/A 12/28/2014    Procedure: CORONARY ARTERY BYPASS GRAFTING (CABG), ON PUMP, TIMES SIX, USING LEFT INTERNAL MAMMARY ARTERY, RIGHT GREATER SAPHENOUS VEIN HARVESTED ENDOSCOPICALLY;  Surgeon: [name], MD;  Location: [facility];  Service: Open Heart Surgery;  Laterality: N/A;  -SEQ LIMA to MID LAD and DISTAL LAD -SVG to DIAGONAL -SEQ SVG to OM1 and OM2 -SVG to PDA  . Tee without cardioversion N/A 12/28/2014    Procedure: TRANSESOPHAGEAL ECHOCARDIOGRAM (TEE);  Surgeon: [name], MD;  Location: [facility];  Service: Open Heart Surgery;  Laterality: N/A;    SOCHx:  reports that he has never  smoked. He has never used smokeless tobacco. He reports that he does not drink alcohol or use illicit drugs.  FAMHx: . Coronary artery disease Mother  . Heart attack Mother  . Coronary artery disease Father  . Heart attack Father  . Coronary artery disease Brother  . Heart attack Brother  . Pancreatic cancer Sister  . Alzheimer's disease Sister   ALLERGIES: No Known Allergies  ROS: Pertinent items are noted in HPI. see H&P for complete ROS  HOME MEDICATIONS: Prior to Admission medications   Medication Sig Start Date End Date Taking? Authorizing Provider  aspirin EC 81 MG tablet Take 81 mg by mouth daily.   Yes Historical Provider, MD  brimonidine-timolol (COMBIGAN) 0.2-0.5 % ophthalmic solution Place 1 drop into both eyes 2 (two) times daily.   Yes Historical Provider, MD  CALCIUM-MAGNESIUM-VITAMIN D PO Take 1 tablet by mouth daily.   Yes Historical Provider, MD  cholecalciferol (VITAMIN D) 1000 UNITS tablet Take 1,000 Units by mouth daily.   Yes Historical Provider, MD  finasteride (PROSCAR) 5 MG tablet Take 5 mg by mouth daily after supper.  07/04/13  Yes Historical Provider, MD  lovastatin (MEVACOR) 40 MG tablet Take 40 mg by mouth daily after  supper.  05/30/13  Yes Historical Provider, MD  LUMIGAN 0.01 % SOLN Place 1 drop into both eyes at bedtime.  06/03/13  Yes Historical Provider, MD  Magnesium 300 MG CAPS Take 300 mg by mouth daily.   Yes Historical Provider, MD  Melatonin 5 MG TABS Take 5 mg by mouth at bedtime as needed (sleep).   Yes Historical Provider, MD  metoprolol tartrate (LOPRESSOR) 25 MG tablet Take 1 tablet (25 mg total) by mouth 2 (two) times daily. 12/10/14  Yes Teodoro Spray, MD  omeprazole (PRILOSEC) 20 MG capsule Take 20 mg by mouth daily before breakfast.  06/10/13  Yes Historical Provider, MD  PARoxetine (PAXIL) 10 MG tablet Take 10 mg by mouth at bedtime as needed.   Yes Historical Provider, MD  polyethylene glycol powder (GLYCOLAX/MIRALAX) powder Take 17 g by  mouth daily.  06/18/13  Yes Historical Provider, MD  tamsulosin (FLOMAX) 0.4 MG CAPS capsule Take 0.4 mg by mouth 2 (two) times daily.  06/18/13  Yes Historical Provider, MD    HOSPITAL MEDICATIONS: I have reviewed the patient's current medications.  VITALS: Blood pressure 93/49, pulse 103, temperature 97.5 F (36.4 C), temperature source Oral, resp. rate 21, height 6\' 1"  (1.854 m), weight 205 lb 14.6 oz (93.4 kg), SpO2 100 %.  PHYSICAL EXAM: General appearance: alert, cooperative and no distress Neck: no carotid bruit and no JVD Lungs: decreased at bases Heart: irregularly irregular rhythm Abdomen: soft, non-tender; bowel sounds normal; no masses,  no organomegaly Extremities: bilta 2+LE edema Pulses: diminnished Skin: cool, pale, dry Neurologic: Grossly normal  LABS: Results for orders placed or performed during the hospital encounter of 12/28/14 (from the past 24 hour(s))  Glucose, capillary     Status: Abnormal   Collection Time: 01/01/15  3:43 PM  Result Value Ref Range   Glucose-Capillary 119 (H) 65 - 99 mg/dL  Glucose, capillary     Status: Abnormal   Collection Time: 01/01/15  7:16 PM  Result Value Ref Range   Glucose-Capillary 109 (H) 65 - 99 mg/dL   Comment 1 Capillary Specimen    Comment 2 Notify RN    Comment 3 Document in Chart   Glucose, capillary     Status: Abnormal   Collection Time: 01/01/15 11:38 PM  Result Value Ref Range   Glucose-Capillary 112 (H) 65 - 99 mg/dL   Comment 1 Capillary Specimen    Comment 2 Notify RN    Comment 3 Document in Chart   Glucose, capillary     Status: Abnormal   Collection Time: 01/02/15  3:56 AM  Result Value Ref Range   Glucose-Capillary 102 (H) 65 - 99 mg/dL   Comment 1 Capillary Specimen    Comment 2 Notify RN    Comment 3 Document in Chart   Basic metabolic panel     Status: Abnormal   Collection Time: 01/02/15  5:20 AM  Result Value Ref Range   Sodium 131 (L) 135 - 145 mmol/L   Potassium 3.6 3.5 - 5.1 mmol/L    Chloride 99 (L) 101 - 111 mmol/L   CO2 26 22 - 32 mmol/L   Glucose, Bld 112 (H) 65 - 99 mg/dL   BUN 13 6 - 20 mg/dL   Creatinine, Ser 0.81 0.61 - 1.24 mg/dL   Calcium 7.9 (L) 8.9 - 10.3 mg/dL   GFR calc non Af Amer >60 >60 mL/min   GFR calc Af Amer >60 >60 mL/min   Anion gap 6 5 - 15  CBC     Status: Abnormal   Collection Time: 01/02/15  5:20 AM  Result Value Ref Range   WBC 9.4 4.0 - 10.5 K/uL   RBC 3.14 (L) 4.22 - 5.81 MIL/uL   Hemoglobin 9.7 (L) 13.0 - 17.0 g/dL   HCT 28.2 (L) 39.0 - 52.0 %   MCV 89.8 78.0 - 100.0 fL   MCH 30.9 26.0 - 34.0 pg   MCHC 34.4 30.0 - 36.0 g/dL   RDW 16.0 (H) 11.5 - 15.5 %   Platelets 166 150 - 400 K/uL  Glucose, capillary     Status: None   Collection Time: 01/02/15  8:11 AM  Result Value Ref Range   Glucose-Capillary 82 65 - 99 mg/dL   Comment 1 Capillary Specimen    Comment 2 Notify RN   Glucose, capillary     Status: Abnormal   Collection Time: 01/02/15 11:33 AM  Result Value Ref Range   Glucose-Capillary 109 (H) 65 - 99 mg/dL   Comment 1 Capillary Specimen    Comment 2 Notify RN     EKG: NSR on 12/29/14  Echo: 01/01/15  LV EF: 65% -  70%  ------------------------------------------------------------------- Indications:   CHF - 428.0.  ------------------------------------------------------------------- History:  PMH:  Coronary artery disease. Congestive heart failure. Risk factors: Hypertension.  ------------------------------------------------------------------- Study Conclusions  - Left ventricle: The cavity size was normal. Wall thickness was normal. Systolic function was vigorous. The estimated ejection fraction was in the range of 65% to 70%. Wall motion was normal; there were no regional wall motion abnormalities. Features are consistent with a pseudonormal left ventricular filling pattern, with concomitant abnormal relaxation and increased filling pressure (grade 2 diastolic  dysfunction).    IMAGING: Dg Chest Port 1 View  01/02/2015   CLINICAL DATA:  Shortness of breath status post coronary artery bypass graft x6.  EXAM: PORTABLE CHEST - 1 VIEW  COMPARISON:  January 01, 2015.  FINDINGS: Stable cardiomediastinal silhouette. Status post coronary artery bypass graft. No pneumothorax is noted. Right-sided PICC line is again noted with distal tip in the expected position of cavoatrial junction. Mild bibasilar opacities are noted most consistent with subsegmental atelectasis with associated pleural effusions. Bony thorax is intact.  IMPRESSION: Stable mild bibasilar opacities consistent with subsegmental atelectasis with associated pleural effusions.   Electronically Signed   By: Marijo Conception, M.D.   On: 01/02/2015 09:54     IMPRESSION: Active Problems:   S/P CABG x 6   Atrial fibrillation with RVR post op   Hypotension post op   Cancer of colon- remote surgery   Dyslipidemia   GERD (gastroesophageal reflux disease)   RECOMMENDATION: MD to see. Lovenox increased by Dr Servando Snare (some bleeding issues post op). Continue PO Lopressor and Amiodarone. He is still on  IV Pressors.  Time Spent Directly with Patient: 45 minutes  Erlene Quan [phone] beeper 01/02/2015, 2:32 PM   Personally seen and examined. Agree with above.  81 year old post bypass with paroxysmal atrial fibrillation postoperatively, hypotension, edema.  1. Postoperative atrial fibrillation  - Previously on IV amiodarone currently receiving by mouth amiodarone 200 mg twice a day. Heart rate is currently well controlled.  - I would envision that as pressors are weaned, atrial fibrillation will become more manageable and perhaps conversion will take place.  - Last recording of normal sinus rhythm was yesterday evening at 11 PM. Previous conversion from atrial fibrillation to normal sinus rhythm was on 8/8 at 11 PM.  - Lovenox has been increased  to 40 mg twice a day with close monitoring.    - If atrial fibrillation continues after dopamine and norepinephrine are discontinued, and if rate control he comes an issue, we can consider cardioversion.  - In addition, if atrial fibrillation continues, he will require long-term anticoagulation. This may be a challenge if he is at increased bleeding risk postoperatively. He also has a history of partial colectomy due to colon cancer.  2. Coronary artery disease  - Post bypass, no ischemia, no chest pain. Improving.  3. Lower extremity edema  - Working on volume overload, diuresis as tolerated.  We will follow along with you.  Candee Furbish, MD

## 2015-01-03 LAB — CBC
HCT: 25.3 % — ABNORMAL LOW (ref 39.0–52.0)
Hemoglobin: 8.6 g/dL — ABNORMAL LOW (ref 13.0–17.0)
MCH: 31.2 pg (ref 26.0–34.0)
MCHC: 34 g/dL (ref 30.0–36.0)
MCV: 91.7 fL (ref 78.0–100.0)
Platelets: 176 10*3/uL (ref 150–400)
RBC: 2.76 MIL/uL — ABNORMAL LOW (ref 4.22–5.81)
RDW: 15.8 % — ABNORMAL HIGH (ref 11.5–15.5)
WBC: 10.1 10*3/uL (ref 4.0–10.5)

## 2015-01-03 LAB — BASIC METABOLIC PANEL
Anion gap: 6 (ref 5–15)
BUN: 18 mg/dL (ref 6–20)
CO2: 26 mmol/L (ref 22–32)
Calcium: 7.9 mg/dL — ABNORMAL LOW (ref 8.9–10.3)
Chloride: 100 mmol/L — ABNORMAL LOW (ref 101–111)
Creatinine, Ser: 0.91 mg/dL (ref 0.61–1.24)
GFR calc Af Amer: >60 mL/min (ref >60)
GFR calc non Af Amer: >60 mL/min (ref >60)
Glucose, Bld: 114 mg/dL — ABNORMAL HIGH (ref 65–99)
Potassium: 3.4 mmol/L — ABNORMAL LOW (ref 3.5–5.1)
Sodium: 132 mmol/L — ABNORMAL LOW (ref 135–145)

## 2015-01-03 LAB — GLUCOSE, CAPILLARY
Glucose-Capillary: 106 mg/dL — ABNORMAL HIGH (ref 65–99)
Glucose-Capillary: 108 mg/dL — ABNORMAL HIGH (ref 65–99)
Glucose-Capillary: 121 mg/dL — ABNORMAL HIGH (ref 65–99)
Glucose-Capillary: 105 mg/dL — ABNORMAL HIGH (ref 65–99)
Glucose-Capillary: 101 mg/dL — ABNORMAL HIGH (ref 65–99)

## 2015-01-03 LAB — PREPARE RBC (CROSSMATCH)

## 2015-01-03 MED ORDER — FUROSEMIDE 10 MG/ML IJ SOLN
20.0000 mg | Freq: Once | INTRAMUSCULAR | Status: AC
  Administered 2015-01-03: 20 mg via INTRAVENOUS
  Filled 2015-01-03: qty 2

## 2015-01-03 MED ORDER — ASPIRIN EC 81 MG PO TBEC
81.0000 mg | DELAYED_RELEASE_TABLET | Freq: Every day | ORAL | Status: DC
  Administered 2015-01-04: 81 mg via ORAL
  Filled 2015-01-03 (×2): qty 1

## 2015-01-03 MED ORDER — ASPIRIN 81 MG PO CHEW
81.0000 mg | CHEWABLE_TABLET | Freq: Every day | ORAL | Status: DC
  Administered 2015-01-03: 81 mg
  Filled 2015-01-03: qty 1

## 2015-01-03 MED ORDER — INSULIN ASPART 100 UNIT/ML ~~LOC~~ SOLN: 0.0000 [IU] | Freq: Three times a day (TID) | SUBCUTANEOUS | Status: DC

## 2015-01-03 MED ORDER — POTASSIUM CHLORIDE 10 MEQ/50ML IV SOLN
10.0000 meq | INTRAVENOUS | Status: AC
  Administered 2015-01-03 (×3): 10 meq via INTRAVENOUS
  Filled 2015-01-03 (×3): qty 50

## 2015-01-03 NOTE — Progress Notes (Signed)
Patient ID: Jacob Reyes, male   DOB: 07/16/1925, 81 y.o.   MRN: [id number] EVENING ROUNDS NOTE :     [address]       [place], [address]             [phone]                 6 Days Post-Op Procedure(s) (LRB): CORONARY ARTERY BYPASS GRAFTING (CABG), ON PUMP, TIMES SIX, USING LEFT INTERNAL MAMMARY ARTERY, RIGHT GREATER SAPHENOUS VEIN HARVESTED ENDOSCOPICALLY (N/A) TRANSESOPHAGEAL ECHOCARDIOGRAM (TEE) (N/A)  Total Length of Stay:  LOS: 6 days  BP 113/71 mmHg  Pulse 91  Temp(Src) 97.3 F (36.3 C) (Oral)  Resp 25  Ht 6\' 1"  (1.854 m)  Wt 215 lb 3.2 oz (97.614 kg)  BMI 28.40 kg/m2  SpO2 100%  .Intake/Output      08/14 0701 - 08/15 0700   P.O. 360   I.V. (mL/kg) 298.3 (3.1)   Blood 335   IV Piggyback    Total Intake(mL/kg) 993.3 (10.2)   Urine (mL/kg/hr) 1050 (0.8)   Stool    Total Output 1050   Net -56.7       Urine Occurrence 1 x     . sodium chloride 10 mL/hr at 12/29/14 2000  . sodium chloride 250 mL (12/29/14 0400)  . sodium chloride Stopped (12/28/14 1804)  . dexmedetomidine Stopped (12/29/14 0100)  . epinephrine Stopped (01/01/15 1530)  . lactated ringers    . lactated ringers 20 mL/hr at 01/02/15 1900  . nitroGLYCERIN Stopped (12/28/14 1700)  . norepinephrine (LEVOPHED) Adult infusion 5 mcg/min (01/03/15 1814)  . phenylephrine (NEO-SYNEPHRINE) Adult infusion Stopped (12/29/14 0000)     Lab Results  Component Value Date   WBC 10.1 01/03/2015   HGB 8.6* 01/03/2015   HCT 25.3* 01/03/2015   PLT 176 01/03/2015   GLUCOSE 114* 01/03/2015   ALT 10* 12/31/2014   AST 19 12/31/2014   NA 132* 01/03/2015   K 3.4* 01/03/2015   CL 100* 01/03/2015   CREATININE 0.91 01/03/2015   BUN 18 01/03/2015   CO2 26 01/03/2015   TSH 3.385 12/30/2014   INR 1.85* 12/28/2014   HGBA1C 5.7* 12/25/2014   Sinus with pac off dopamine, feels better this evening still weak and not walking much  [name] MD  Beeper [phone] Office  [phone] 01/03/2015 7:40 PM

## 2015-01-03 NOTE — Progress Notes (Signed)
Patient ID: BAWI LAKINS, male   DOB: 20-Apr-1926, 81 y.o.   MRN: [id number] Patient ID: KEVION FATHEREE, male   DOB: Dec 26, 1925, 81 y.o.   MRN: [id number] TCTS DAILY ICU PROGRESS NOTE                   [address]            [place] [address]          [phone]   6 Days Post-Op Procedure(s) (LRB): CORONARY ARTERY BYPASS GRAFTING (CABG), ON PUMP, TIMES SIX, USING LEFT INTERNAL MAMMARY ARTERY, RIGHT GREATER SAPHENOUS VEIN HARVESTED ENDOSCOPICALLY (N/A) TRANSESOPHAGEAL ECHOCARDIOGRAM (TEE) (N/A)  Total Length of Stay:  LOS: 6 days   Subjective: Awake and alert, neuro intact up in chair , still on low dose dopamine and levaphed  Objective: Vital signs in last 24 hours: Temp:  [97.3 F (36.3 C)-97.8 F (36.6 C)] 97.3 F (36.3 C) (08/14 0806) Pulse Rate:  [32-126] 104 (08/14 0700) Cardiac Rhythm:  [-] Atrial fibrillation (08/14 0700) Resp:  [12-31] 20 (08/14 0700) BP: (74-140)/(39-101) 127/77 mmHg (08/14 0700) SpO2:  [81 %-100 %] 98 % (08/14 0700)  Filed Weights   12/31/14 0600 01/01/15 0600 01/02/15 0500  Weight: 212 lb 11.9 oz (96.5 kg) 207 lb 3.7 oz (94 kg) 205 lb 14.6 oz (93.4 kg)    Weight change:    Hemodynamic parameters for last 24 hours:    Intake/Output from previous day: 08/13 0701 - 08/14 0700 In: 1642.9 [P.O.:480; I.V.:1112.9; IV Piggyback:50] Out: 825 [Urine:825]  Intake/Output this shift:    Current Meds: Scheduled Meds: . sodium chloride   Intravenous Once  . amiodarone  200 mg Oral Q12H   Followed by  . [START ON 01/07/2015] amiodarone  200 mg Oral Daily  . aspirin EC  325 mg Oral Daily   Or  . aspirin  324 mg Per Tube Daily  . bisacodyl  10 mg Oral Daily   Or  . bisacodyl  10 mg Rectal Daily  . brimonidine  1 drop Both Eyes BID   And  . timolol  1 drop Both Eyes BID  . docusate sodium  200 mg Oral Daily  . enoxaparin (LOVENOX) injection  40 mg Subcutaneous Q12H  . finasteride  5 mg Oral QPC supper  . insulin aspart   0-24 Units Subcutaneous 6 times per day  . latanoprost  1 drop Both Eyes QHS  . magnesium chloride  2 tablet Oral Daily  . metoprolol tartrate  12.5 mg Oral BID  . pantoprazole  40 mg Oral Daily  . pravastatin  40 mg Oral q1800  . sodium chloride  10-40 mL Intracatheter Q12H  . sodium chloride  3 mL Intravenous Q12H  . tamsulosin  0.4 mg Oral BID   Continuous Infusions: . sodium chloride 10 mL/hr at 12/29/14 2000  . sodium chloride 250 mL (12/29/14 0400)  . sodium chloride Stopped (12/28/14 1804)  . dexmedetomidine Stopped (12/29/14 0100)  . DOPamine 3 mcg/kg/min (01/03/15 0700)  . epinephrine Stopped (01/01/15 1530)  . lactated ringers    . lactated ringers 20 mL/hr at 01/02/15 1900  . nitroGLYCERIN Stopped (12/28/14 1700)  . norepinephrine (LEVOPHED) Adult infusion 7.5 mcg/min (01/03/15 0700)  . phenylephrine (NEO-SYNEPHRINE) Adult infusion Stopped (12/29/14 0000)   PRN Meds:.sodium chloride, lactated ringers, metoprolol, morphine injection, ondansetron (ZOFRAN) IV, oxyCODONE, sodium chloride, sodium chloride, traMADol  General appearance: alert, cooperative, appears stated age and no distress Neurologic: intact Heart: irregularly  irregular rhythm Lungs: diminished breath sounds bibasilar Abdomen: soft, non-tender; bowel sounds normal; no masses,  no organomegaly Extremities: extremities normal, atraumatic, no cyanosis or edema and Homans sign is negative, no sign of DVT Wound: sternum stable, legs incisions intact Oozing from lower puncture for evh right lower leg ,   Lab Results: CBC:  Recent Labs  01/02/15 0520 01/03/15 0401  WBC 9.4 10.1  HGB 9.7* 8.6*  HCT 28.2* 25.3*  PLT 166 176   BMET:   Recent Labs  01/02/15 0520 01/03/15 0401  NA 131* 132*  K 3.6 3.4*  CL 99* 100*  CO2 26 26  GLUCOSE 112* 114*  BUN 13 18  CREATININE 0.81 0.91  CALCIUM 7.9* 7.9*    PT/INR: No results for input(s): LABPROT, INR in the last 72 hours. Radiology: No results  found. ECHO: done yesterday: Study Conclusions  - Left ventricle: The cavity size was normal. Wall thickness was normal. Systolic function was vigorous. The estimated ejection fraction was in the range of 65% to 70%. Wall motion was normal; there were no regional wall motion abnormalities. Features are consistent with a pseudonormal left ventricular filling pattern, with concomitant abnormal relaxation and increased filling pressure (grade 2 diastolic dysfunction).  Assessment/Plan: S/P Procedure(s) (LRB): CORONARY ARTERY BYPASS GRAFTING (CABG), ON PUMP, TIMES SIX, USING LEFT INTERNAL MAMMARY ARTERY, RIGHT GREATER SAPHENOUS VEIN HARVESTED ENDOSCOPICALLY (N/A) TRANSESOPHAGEAL ECHOCARDIOGRAM (TEE) (N/A) Mobilize Diuresis- limited by BP  Diabetes control See progression orders plt level increasing Still in afib, will increase lovenox doseage carefully with age and previous bleeding problems  cardiology to  plan for afib management NL renal function  Wean drips off as tolerated  With border line BP and lower HGB will give one unit of prbs  [name] 01/03/2015 8:39 AM

## 2015-01-03 NOTE — Progress Notes (Signed)
Subjective:  Feels a little better, still weakened.   Objective:  Vital Signs in the last 24 hours: Temp:  [97.3 F (36.3 C)-97.8 F (36.6 C)] 97.3 F (36.3 C) (08/14 0806) Pulse Rate:  [32-126] 104 (08/14 0700) Resp:  [12-31] 20 (08/14 0700) BP: (74-140)/(39-101) 127/77 mmHg (08/14 0700) SpO2:  [81 %-100 %] 98 % (08/14 0700)  Intake/Output from previous day: 08/13 0701 - 08/14 0700 In: 1642.9 [P.O.:480; I.V.:1112.9; IV Piggyback:50] Out: 825 [Urine:825]   Physical Exam: General: Well developed, well nourished, in no acute distress. Head:  Normocephalic and atraumatic. Lungs: Clear to auscultation and percussion. Scar noted Heart: Normal S1 and S2 with frequent ectopy  No murmur, rubs or gallops.  Abdomen: soft, non-tender, positive bowel sounds. Extremities: +edema. Pale Neurologic: Alert and oriented x 3.    Lab Results:  Recent Labs  01/02/15 0520 01/03/15 0401  WBC 9.4 10.1  HGB 9.7* 8.6*  PLT 166 176    Recent Labs  01/02/15 0520 01/03/15 0401  NA 131* 132*  K 3.6 3.4*  CL 99* 100*  CO2 26 26  GLUCOSE 112* 114*  BUN 13 18  CREATININE 0.81 0.91   No results for input(s): TROPONINI in the last 72 hours.  Invalid input(s): CK, MB Hepatic Function Panel No results for input(s): PROT, ALBUMIN, AST, ALT, ALKPHOS, BILITOT, BILIDIR, IBILI in the last 72 hours. No results for input(s): CHOL in the last 72 hours. No results for input(s): PROTIME in the last 72 hours.  Imaging: Dg Chest Port 1 View  01/02/2015   CLINICAL DATA:  Shortness of breath status post coronary artery bypass graft x6.  EXAM: PORTABLE CHEST - 1 VIEW  COMPARISON:  January 01, 2015.  FINDINGS: Stable cardiomediastinal silhouette. Status post coronary artery bypass graft. No pneumothorax is noted. Right-sided PICC line is again noted with distal tip in the expected position of cavoatrial junction. Mild bibasilar opacities are noted most consistent with subsegmental atelectasis with  associated pleural effusions. Bony thorax is intact.  IMPRESSION: Stable mild bibasilar opacities consistent with subsegmental atelectasis with associated pleural effusions.   Electronically Signed   By: Marijo Conception, M.D.   On: 01/02/2015 09:54   Dg Chest Port 1 View  01/01/2015   CLINICAL DATA:  PICC line placement  EXAM: PORTABLE CHEST - 1 VIEW  COMPARISON:  01/01/2015 at 0626 hours  FINDINGS: Right arm PICC terminates at the cavoatrial junction.  Small bilateral pleural effusions. No frank interstitial edema. No pneumothorax.  Right IJ venous sheath terminates in the upper SVC.  Heart is top-normal in size. Postsurgical changes related to prior CABG.  IMPRESSION: Right arm PICC terminates at the cavoatrial junction.  Small bilateral pleural effusions.  No pneumothorax.   Electronically Signed   By: Julian Hy M.D.   On: 01/01/2015 17:58   Personally viewed.   Telemetry: Currently NSR with PAC's Personally viewed.    Assessment/Plan:  Active Problems:   Cancer of colon- remote surgery   S/P CABG x 6   Atrial fibrillation with RVR post op   Dyslipidemia   Hypotension post op   GERD (gastroesophageal reflux disease)  1. Post op AFIB  - Converted to NSR. PAC's noted  - Continue PO amio  - May have periods again of AFIB as healing occurs  2. CAD  - post CABG  - will follow  3. Hypotension  - trying to wean Dop and norepi  SKAINS, MARK 01/03/2015, 11:19 AM

## 2015-01-04 ENCOUNTER — Inpatient Hospital Stay (HOSPITAL_COMMUNITY): Payer: Medicare Other

## 2015-01-04 DIAGNOSIS — I4892 Unspecified atrial flutter: Secondary | ICD-10-CM

## 2015-01-04 LAB — TYPE AND SCREEN
ABO/RH(D): B POS
Antibody Screen: NEGATIVE
Unit division: 0

## 2015-01-04 LAB — GLUCOSE, CAPILLARY
Glucose-Capillary: 77 mg/dL (ref 65–99)
Glucose-Capillary: 114 mg/dL — ABNORMAL HIGH (ref 65–99)
Glucose-Capillary: 105 mg/dL — ABNORMAL HIGH (ref 65–99)
Glucose-Capillary: 114 mg/dL — ABNORMAL HIGH (ref 65–99)

## 2015-01-04 LAB — BASIC METABOLIC PANEL
Anion gap: 6 (ref 5–15)
BUN: 18 mg/dL (ref 6–20)
CO2: 26 mmol/L (ref 22–32)
Calcium: 7.7 mg/dL — ABNORMAL LOW (ref 8.9–10.3)
Chloride: 99 mmol/L — ABNORMAL LOW (ref 101–111)
Creatinine, Ser: 0.82 mg/dL (ref 0.61–1.24)
GFR calc Af Amer: >60 mL/min (ref >60)
GFR calc non Af Amer: >60 mL/min (ref >60)
Glucose, Bld: 100 mg/dL — ABNORMAL HIGH (ref 65–99)
Potassium: 3.4 mmol/L — ABNORMAL LOW (ref 3.5–5.1)
Sodium: 131 mmol/L — ABNORMAL LOW (ref 135–145)

## 2015-01-04 LAB — CBC
HCT: 27.8 % — ABNORMAL LOW (ref 39.0–52.0)
Hemoglobin: 9.5 g/dL — ABNORMAL LOW (ref 13.0–17.0)
MCH: 30 pg (ref 26.0–34.0)
MCHC: 34.2 g/dL (ref 30.0–36.0)
MCV: 87.7 fL (ref 78.0–100.0)
Platelets: 220 10*3/uL (ref 150–400)
RBC: 3.17 MIL/uL — ABNORMAL LOW (ref 4.22–5.81)
RDW: 19.5 % — ABNORMAL HIGH (ref 11.5–15.5)
WBC: 10.9 10*3/uL — ABNORMAL HIGH (ref 4.0–10.5)

## 2015-01-04 MED ORDER — ENOXAPARIN SODIUM 30 MG/0.3ML ~~LOC~~ SOLN
30.0000 mg | SUBCUTANEOUS | Status: DC
  Administered 2015-01-04: 30 mg via SUBCUTANEOUS
  Filled 2015-01-04 (×2): qty 0.3

## 2015-01-04 MED ORDER — ASPIRIN EC 81 MG PO TBEC
81.0000 mg | DELAYED_RELEASE_TABLET | Freq: Every day | ORAL | Status: DC
  Administered 2015-01-04 – 2015-01-11 (×8): 81 mg via ORAL
  Filled 2015-01-04 (×8): qty 1

## 2015-01-04 MED ORDER — POTASSIUM CHLORIDE 10 MEQ/50ML IV SOLN: 10.0000 meq | INTRAVENOUS | Status: DC | PRN

## 2015-01-04 MED ORDER — SODIUM CHLORIDE 0.9 % IJ SOLN
3.0000 mL | Freq: Two times a day (BID) | INTRAMUSCULAR | Status: DC
  Administered 2015-01-04 – 2015-01-11 (×12): 3 mL via INTRAVENOUS

## 2015-01-04 MED ORDER — SODIUM CHLORIDE 0.9 % IV SOLN: 250.0000 mL | INTRAVENOUS | Status: DC | PRN

## 2015-01-04 MED ORDER — LIDOCAINE HCL (PF) 1 % IJ SOLN
10.0000 mL | Freq: Once | INTRAMUSCULAR | Status: AC
  Administered 2015-01-04: 5 mL via INTRADERMAL

## 2015-01-04 MED ORDER — OXYCODONE HCL 5 MG PO TABS
5.0000 mg | ORAL_TABLET | ORAL | Status: DC | PRN
  Administered 2015-01-04 – 2015-01-10 (×12): 5 mg via ORAL
  Filled 2015-01-04 (×11): qty 1

## 2015-01-04 MED ORDER — LIDOCAINE HCL (PF) 1 % IJ SOLN
INTRAMUSCULAR | Status: AC
  Filled 2015-01-04: qty 5

## 2015-01-04 MED ORDER — TRAMADOL HCL 50 MG PO TABS
50.0000 mg | ORAL_TABLET | ORAL | Status: DC | PRN
  Administered 2015-01-10: 50 mg via ORAL
  Filled 2015-01-04 (×2): qty 1

## 2015-01-04 MED ORDER — ALBUTEROL SULFATE (2.5 MG/3ML) 0.083% IN NEBU: 2.5000 mg | INHALATION_SOLUTION | RESPIRATORY_TRACT | Status: DC | PRN

## 2015-01-04 MED ORDER — BISACODYL 10 MG RE SUPP: 10.0000 mg | Freq: Every day | RECTAL | Status: DC | PRN

## 2015-01-04 MED ORDER — FUROSEMIDE 20 MG PO TABS
20.0000 mg | ORAL_TABLET | Freq: Every day | ORAL | Status: DC
  Administered 2015-01-05 – 2015-01-11 (×7): 20 mg via ORAL
  Filled 2015-01-04 (×7): qty 1

## 2015-01-04 MED ORDER — POTASSIUM CHLORIDE CRYS ER 20 MEQ PO TBCR
20.0000 meq | EXTENDED_RELEASE_TABLET | Freq: Every day | ORAL | Status: DC
  Administered 2015-01-04 – 2015-01-11 (×8): 20 meq via ORAL
  Filled 2015-01-04 (×8): qty 1

## 2015-01-04 MED ORDER — DOCUSATE SODIUM 100 MG PO CAPS
200.0000 mg | ORAL_CAPSULE | Freq: Every day | ORAL | Status: DC
  Administered 2015-01-04 – 2015-01-11 (×8): 200 mg via ORAL
  Filled 2015-01-04 (×9): qty 2

## 2015-01-04 MED ORDER — MOVING RIGHT ALONG BOOK
Freq: Once | Status: AC
  Administered 2015-01-04: 21:00:00
  Filled 2015-01-04 (×2): qty 1

## 2015-01-04 MED ORDER — BRIMONIDINE TARTRATE 0.2 % OP SOLN
1.0000 [drp] | Freq: Two times a day (BID) | OPHTHALMIC | Status: DC
  Filled 2015-01-04: qty 5

## 2015-01-04 MED ORDER — BISACODYL 5 MG PO TBEC
10.0000 mg | DELAYED_RELEASE_TABLET | Freq: Every day | ORAL | Status: DC | PRN
  Administered 2015-01-08: 10 mg via ORAL
  Filled 2015-01-04: qty 2

## 2015-01-04 MED ORDER — POLYETHYLENE GLYCOL 3350 17 GM/SCOOP PO POWD
17.0000 g | Freq: Every day | ORAL | Status: DC
  Filled 2015-01-04: qty 255

## 2015-01-04 MED ORDER — POLYETHYLENE GLYCOL 3350 17 G PO PACK
17.0000 g | PACK | Freq: Every day | ORAL | Status: DC
  Administered 2015-01-05 – 2015-01-11 (×7): 17 g via ORAL
  Filled 2015-01-04 (×9): qty 1

## 2015-01-04 MED ORDER — SODIUM CHLORIDE 0.9 % IJ SOLN: 3.0000 mL | INTRAMUSCULAR | Status: DC | PRN

## 2015-01-04 MED ORDER — INSULIN ASPART 100 UNIT/ML ~~LOC~~ SOLN: 0.0000 [IU] | Freq: Three times a day (TID) | SUBCUTANEOUS | Status: DC

## 2015-01-04 MED ORDER — TIMOLOL MALEATE 0.5 % OP SOLN
1.0000 [drp] | Freq: Two times a day (BID) | OPHTHALMIC | Status: DC
  Filled 2015-01-04: qty 5

## 2015-01-04 MED ORDER — PANTOPRAZOLE SODIUM 40 MG PO TBEC
40.0000 mg | DELAYED_RELEASE_TABLET | Freq: Every day | ORAL | Status: DC
  Administered 2015-01-05 – 2015-01-11 (×7): 40 mg via ORAL
  Filled 2015-01-04 (×7): qty 1

## 2015-01-04 MED ORDER — BRIMONIDINE TARTRATE-TIMOLOL 0.2-0.5 % OP SOLN: 1.0000 [drp] | Freq: Two times a day (BID) | OPHTHALMIC | Status: DC

## 2015-01-04 NOTE — Evaluation (Signed)
Occupational Therapy Evaluation Patient Details Name: Jacob Reyes MRN: [id number] DOB: 1925/11/04 Today's Date: 01/04/2015    History of Present Illness Adm 8/8 and underwent CABG x 6; post-op afib and hypotension requiring pressors; 8/11 AM found on floor by nsg  PMHx- colon Ca; bil HOH; HTN; anxiety   Clinical Impression   Pt admitted with above. He demonstrates the below listed deficits and will benefit from continued OT to maximize safety and independence with BADLs.  Currently, pt requires mod - max A for ADLs.  He will requires post acute rehab at discharge, but per CIR screen does not qualify for CIR, therefore, recommend SNF.  Will follow acutely.       Follow Up Recommendations  SNF;Supervision/Assistance - 24 hour    Equipment Recommendations  None recommended by OT    Recommendations for Other Services       Precautions / Restrictions Precautions Precautions: Sternal;Fall Precaution Comments: Pt requires min A to maintain precautions  Restrictions Weight Bearing Restrictions: Yes (Sternal precautions)      Mobility Bed Mobility               General bed mobility comments: sitting up in recliner  Transfers Overall transfer level: Needs assistance Equipment used: Rolling walker (2 wheeled) Transfers: Sit to/from Stand Sit to Stand: Mod assist         General transfer comment: Pt requires verbal cues for sequence and assist to power up into standing.  moved sit to stand x 2 with long rest break between due to fatigue    Balance Overall balance assessment: Needs assistance Sitting-balance support: Feet supported Sitting balance-Leahy Scale: Fair     Standing balance support: Single extremity supported Standing balance-Leahy Scale: Poor Standing balance comment: posterior LOB                            ADL Overall ADL's : Needs assistance/impaired Eating/Feeding: Independent   Grooming: Wash/dry hands;Wash/dry face;Applying  deodorant;Brushing hair;Oral care;Set up;Sitting   Upper Body Bathing: Minimal assitance;Sitting   Lower Body Bathing: Moderate assistance;Sit to/from stand   Upper Body Dressing : Moderate assistance;Sitting   Lower Body Dressing: Moderate assistance;Sit to/from stand   Toilet Transfer: Moderate assistance;Stand-pivot;BSC;+2 for safety/equipment;RW   Toileting- Clothing Manipulation and Hygiene: Maximal assistance;Sit to/from stand       Functional mobility during ADLs: Moderate assistance General ADL Comments: requires min A for sternal precautions      Vision     Perception     Praxis      Pertinent Vitals/Pain Pain Assessment: Faces Faces Pain Scale: Hurts a little bit Pain Location: chest Pain Descriptors / Indicators: Grimacing Pain Intervention(s): Monitored during session     Hand Dominance     Extremity/Trunk Assessment Upper Extremity Assessment Upper Extremity Assessment: Generalized weakness   Lower Extremity Assessment Lower Extremity Assessment: Defer to PT evaluation   Cervical / Trunk Assessment Cervical / Trunk Assessment: Kyphotic   Communication Communication Communication: HOH   Cognition Arousal/Alertness: Awake/alert Behavior During Therapy: WFL for tasks assessed/performed Overall Cognitive Status: Within Functional Limits for tasks assessed                     General Comments       Exercises       Shoulder Instructions      Home Living Family/patient expects to be discharged to:: Skilled nursing facility  Prior Functioning/Environment Level of Independence: Independent        Comments: works out at [employer] Diagnosis: Generalized weakness;Acute pain   OT Problem List: Decreased strength;Decreased activity tolerance;Impaired balance (sitting and/or standing);Decreased safety awareness;Decreased knowledge of use of DME or AE;Cardiopulmonary status limiting  activity;Decreased knowledge of precautions   OT Treatment/Interventions: Self-care/ADL training;Energy conservation;DME and/or AE instruction;Therapeutic activities;Patient/family education;Balance training    OT Goals(Current goals can be found in the care plan section) Acute Rehab OT Goals Patient Stated Goal: regain independence  OT Goal Formulation: With patient Time For Goal Achievement: 01/18/15 Potential to Achieve Goals: Good ADL Goals Pt Will Perform Grooming: with min assist;standing Pt Will Perform Upper Body Bathing: with set-up;with supervision;sitting Pt Will Perform Lower Body Bathing: with min assist;sit to/from stand Pt Will Perform Upper Body Dressing: with set-up;with supervision;sitting Pt Will Perform Lower Body Dressing: with min assist;sit to/from stand Pt Will Transfer to Toilet: with min assist;ambulating;regular height toilet;bedside commode;grab bars Pt Will Perform Toileting - Clothing Manipulation and hygiene: with min assist;sit to/from stand  OT Frequency: Min 2X/week   Barriers to D/C: Decreased caregiver support          Co-evaluation              End of Session Equipment Utilized During Treatment: Rolling walker Nurse Communication: Mobility status  Activity Tolerance: Patient limited by fatigue Patient left: in chair;with call bell/phone within reach;with chair alarm set   Time: 8871-9597 OT Time Calculation (min): 19 min Charges:  OT General Charges $OT Visit: 1 Procedure OT Evaluation $Initial OT Evaluation Tier I: 1 Procedure G-Codes:    Lucille Passy M 17-Jan-2015, 11:22 PM

## 2015-01-04 NOTE — Plan of Care (Signed)
Problem: Phase II - Intermediate Post-Op Goal: Activity Progressed Outcome: Not Progressing Patient needs PT to work with him

## 2015-01-04 NOTE — Progress Notes (Signed)
Physical Therapy Treatment Patient Details Name: Jacob Reyes MRN: [id number] DOB: 1925-12-28 Today's Date: 01/04/2015    History of Present Illness Adm 8/8 and underwent CABG x 6; post-op afib and hypotension requiring pressors; 8/11 AM found on floor by nsg  PMHx- colon Ca; bil HOH; HTN; anxiety    PT Comments    Patient able to walk 5 feet with RW! Continues to be limited by orthostasis (asymptomatic this date)        MAP sit   77       MAP stand 56       MAP seated with exercise 80       MAP stand 72     HR 97-118 Very motivated and feel based on his prior functional status that he will do well with therapies once BP issues stabilize.   Follow Up Recommendations  CIR;Supervision/Assistance - 24 hour     Equipment Recommendations  Rolling walker with 5" wheels    Recommendations for Other Services OT consult     Precautions / Restrictions Precautions Precautions: Sternal;Fall Precaution Comments: able to describe precautions and reason why Restrictions Weight Bearing Restrictions:  (sternal precautions)    Mobility  Bed Mobility                  Transfers Overall transfer level: Needs assistance Equipment used: Rolling walker (2 wheeled) Transfers: Sit to/from Stand Sit to Stand: Mod assist;From elevated surface;+2 physical assistance;+2 safety/equipment;Min assist (pillow in seat of recliner)         General transfer comment: x 3; required incr assist each time as he fatigued; vc to maintian precautions  Ambulation/Gait Ambulation/Gait assistance: Min assist;+2 safety/equipment Ambulation Distance (Feet): 5 Feet Assistive device: Rolling walker (2 wheeled) (CLOSE follow with chair) Gait Pattern/deviations: Step-to pattern;Decreased stride length;Decreased weight shift to right;Antalgic Gait velocity: decr Gait velocity interpretation: <1.8 ft/sec, indicative of risk for recurrent falls General Gait Details: leading with RLE (sore due to  incision dehisced); very unsteady   Stairs            Wheelchair Mobility    Modified Rankin (Stroke Patients Only)       Balance     Sitting balance-Leahy Scale: Fair       Standing balance-Leahy Scale: Poor                      Cognition Arousal/Alertness: Awake/alert Behavior During Therapy: WFL for tasks assessed/performed Overall Cognitive Status: Within Functional Limits for tasks assessed                      Exercises General Exercises - Upper Extremity Shoulder ABduction: AROM;Both;10 reps;Seated (to 90 with elbows flexed) Elbow Flexion: AROM;Both;20 reps;Seated Elbow Extension: AROM;Both;20 reps;Seated Digit Composite Flexion: AROM;Both;15 reps;Seated General Exercises - Lower Extremity Ankle Circles/Pumps: AROM;Both;10 reps Long Arc Quad: AROM;Both;10 reps;Seated Hip Flexion/Marching: AAROM;Both;5 reps;Standing Mini-Sqauts: AROM;Both;10 reps;Standing    General Comments General comments (skin integrity, edema, etc.): Prior to initial sit to stand, performed LE exercises. When BP dropped and returned to sitting, performed UE exercises.      Pertinent Vitals/Pain Pain Assessment: No/denies pain    Home Living                      Prior Function            PT Goals (current goals can now be found in the care plan section) Acute Rehab PT Goals Patient Stated  Goal: regain his strength "i'm so weak" PT Goal Formulation: With patient Time For Goal Achievement: 01/07/15 Potential to Achieve Goals: Good Progress towards PT goals: Progressing toward goals    Frequency  Min 3X/week    PT Plan Discharge plan needs to be updated    Co-evaluation             End of Session Equipment Utilized During Treatment: Gait belt Activity Tolerance: Treatment limited secondary to medical complications (Comment) (orthostasis WITHOUT symptoms) Patient left: with call bell/phone within reach;in chair;with chair alarm set      Time: 1357-1419 PT Time Calculation (min) (ACUTE ONLY): 22 min  Charges:  $Therapeutic Exercise: 8-22 mins                    G Codes:      Sher Shampine 2015-01-28, 2:34 PM  Pager [phone]

## 2015-01-04 NOTE — Progress Notes (Signed)
Rehab Admissions Coordinator Note:  Patient was screened by Cleatrice Burke for appropriateness for an Inpatient Acute Rehab Consult per PT recommendation.  At this time, we are recommending [facility].It is doubtful [facility] will consider an inpt rehab admission for pt's diagnosis.   Cleatrice Burke 01/04/2015, 3:12 PM  I can be reached at [phone].

## 2015-01-04 NOTE — Progress Notes (Signed)
Pt transferred to 2W20 with belongings. Report given to receiving RN and all questions answered. VSS during transfer.  Pt assisted to chair in new room with receiving RN and NT. Family at bedside.

## 2015-01-04 NOTE — Plan of Care (Signed)
Problem: Problem: Cardiovascular Progression Goal: HEMODYNAMIC STABILITY Outcome: Progressing Decreased need for vasoactive drips; will continue to wean as able

## 2015-01-04 NOTE — Progress Notes (Signed)
[facility] DAILY ICU PROGRESS NOTE                   [address]            [place] [address]          [phone]   7 Days Post-Op Procedure(s) (LRB): CORONARY ARTERY BYPASS GRAFTING (CABG), ON PUMP, TIMES SIX, USING LEFT INTERNAL MAMMARY ARTERY, RIGHT GREATER SAPHENOUS VEIN HARVESTED ENDOSCOPICALLY (N/A) TRANSESOPHAGEAL ECHOCARDIOGRAM (TEE) (N/A)  Total Length of Stay:  LOS: 7 days   Subjective: OOB in chair. Feels well.  Wants to get up with PT today. Only complaint is constipation.   Objective: Vital signs in last 24 hours: Temp:  [97.3 F (36.3 C)-98.3 F (36.8 C)] 98 F (36.7 C) (08/15 0515) Pulse Rate:  [25-119] 116 (08/15 0700) Cardiac Rhythm:  [-] Normal sinus rhythm;Atrial fibrillation (08/14 2000) Resp:  [14-29] 18 (08/15 0700) BP: (74-142)/(35-96) 106/88 mmHg (08/15 0700) SpO2:  [82 %-100 %] 94 % (08/15 0700) Weight:  [209 lb 7 oz (95 kg)-215 lb 3.2 oz (97.614 kg)] 209 lb 7 oz (95 kg) (08/15 0500)  Filed Weights   01/02/15 0500 01/03/15 1415 01/04/15 0500  Weight: 205 lb 14.6 oz (93.4 kg) 215 lb 3.2 oz (97.614 kg) 209 lb 7 oz (95 kg)    Weight change:    Hemodynamic parameters for last 24 hours:    Intake/Output from previous day: 08/14 0701 - 08/15 0700 In: 1655.6 [P.O.:480; I.V.:840.6; Blood:335] Out: 3536 [Urine:1650]  CBGs 105-101-100     Current Meds: Scheduled Meds: . sodium chloride   Intravenous Once  . amiodarone  200 mg Oral Q12H   Followed by  . [START ON 01/07/2015] amiodarone  200 mg Oral Daily  . aspirin EC  81 mg Oral Daily   Or  . aspirin  81 mg Per Tube Daily  . bisacodyl  10 mg Oral Daily   Or  . bisacodyl  10 mg Rectal Daily  . brimonidine  1 drop Both Eyes BID   And  . timolol  1 drop Both Eyes BID  . docusate sodium  200 mg Oral Daily  . enoxaparin (LOVENOX) injection  40 mg Subcutaneous Q12H  . finasteride  5 mg Oral QPC supper  . insulin aspart  0-24 Units Subcutaneous TID AC & HS  . latanoprost  1 drop  Both Eyes QHS  . magnesium chloride  2 tablet Oral Daily  . metoprolol tartrate  12.5 mg Oral BID  . pantoprazole  40 mg Oral Daily  . pravastatin  40 mg Oral q1800  . sodium chloride  10-40 mL Intracatheter Q12H  . sodium chloride  3 mL Intravenous Q12H  . tamsulosin  0.4 mg Oral BID   Continuous Infusions: . sodium chloride 10 mL/hr at 12/29/14 2000  . sodium chloride 250 mL (12/29/14 0400)  . sodium chloride Stopped (12/28/14 1804)  . dexmedetomidine Stopped (12/29/14 0100)  . epinephrine Stopped (01/01/15 1530)  . lactated ringers    . lactated ringers 20 mL/hr at 01/04/15 0500  . nitroGLYCERIN Stopped (12/28/14 1700)  . norepinephrine (LEVOPHED) Adult infusion 2 mcg/min (01/04/15 0700)  . phenylephrine (NEO-SYNEPHRINE) Adult infusion Stopped (12/29/14 0000)   PRN Meds:.sodium chloride, lactated ringers, metoprolol, morphine injection, ondansetron (ZOFRAN) IV, oxyCODONE, sodium chloride, sodium chloride, traMADol   Physical Exam: General appearance: alert, cooperative and no distress Heart: RRR with frequent PACs Lungs: clear to auscultation bilaterally Abdomen: soft, non-tender; bowel sounds normal; no masses,  no organomegaly Extremities: Mild LE edema, R>L with R thigh ecchymosis Wound: Sternal wound healing well.  R thigh wound clean and dry. R lower leg incision open with large amount sanguinous drainage     Lab Results: CBC: Recent Labs  01/03/15 0401 01/04/15 0510  WBC 10.1 10.9*  HGB 8.6* 9.5*  HCT 25.3* 27.8*  PLT 176 220   BMET:  Recent Labs  01/03/15 0401 01/04/15 0510  NA 132* 131*  K 3.4* 3.4*  CL 100* 99*  CO2 26 26  GLUCOSE 114* 100*  BUN 18 18  CREATININE 0.91 0.82  CALCIUM 7.9* 7.7*    PT/INR: No results for input(s): LABPROT, INR in the last 72 hours. Radiology: No results found.   Assessment/Plan: S/P Procedure(s) (LRB): CORONARY ARTERY BYPASS GRAFTING (CABG), ON PUMP, TIMES SIX, USING LEFT INTERNAL MAMMARY ARTERY, RIGHT GREATER  SAPHENOUS VEIN HARVESTED ENDOSCOPICALLY (N/A) TRANSESOPHAGEAL ECHOCARDIOGRAM (TEE) (N/A)  CV- Rhythm mostly sinus with frequent PACs this am.  BPs improved after transfusion, still on some Levophed.  Continue po Amio, Lopressor, wean Levo as able. Rhythm management per cardiology.  Hypokalemia- replace K+.  Acute postop blood loss anemia- H/H improved after transfusion. Will slowly increase Lovenox and watch for further bleeding.  Vol overload- diurese as BP allows.  GI- constipation, will Rx LOC today.  RLE with persistent sanguinous drainage from open lower leg incision.  Area anesthetized with 5 ml 1% Xylocaine and prepped with Betadine.  One 3-0 nylon suture placed to area with control of bleeding. Patient tolerated well.  Deconditioning- PT/CRPI.   Heenan,GINA H 01/04/2015 7:35 AM

## 2015-01-04 NOTE — Care Management Important Message (Signed)
Important Message  Patient Details  Name: [name] MRN: [id number] Date of Birth: 1926-05-06   Medicare Important Message Given:  Yes-third notification given    Nathen May 01/04/2015, 11:08 AMImportant Message  Patient Details  Name: [name] MRN: [id number] Date of Birth: [birth year]   Medicare Important Message Given:  Yes-third notification given    Nathen May 01/04/2015, 11:07 AM

## 2015-01-04 NOTE — Progress Notes (Addendum)
Patient Name: Jacob Reyes Date of Encounter: 01/04/2015  Active Problems:   Cancer of colon- remote surgery   S/P CABG x 6   Atrial fibrillation with RVR post op   Dyslipidemia   Hypotension post op   GERD (gastroesophageal reflux disease)    SUBJECTIVE  Feels better. Denies chest pain, sob or palpitation.   CURRENT MEDS . sodium chloride   Intravenous Once  . amiodarone  200 mg Oral Q12H   Followed by  . [START ON 01/07/2015] amiodarone  200 mg Oral Daily  . aspirin EC  81 mg Oral Daily   Or  . aspirin  81 mg Per Tube Daily  . bisacodyl  10 mg Oral Daily   Or  . bisacodyl  10 mg Rectal Daily  . brimonidine  1 drop Both Eyes BID   And  . timolol  1 drop Both Eyes BID  . docusate sodium  200 mg Oral Daily  . enoxaparin (LOVENOX) injection  30 mg Subcutaneous Q24H  . finasteride  5 mg Oral QPC supper  . insulin aspart  0-24 Units Subcutaneous TID AC & HS  . latanoprost  1 drop Both Eyes QHS  . magnesium chloride  2 tablet Oral Daily  . metoprolol tartrate  12.5 mg Oral BID  . pantoprazole  40 mg Oral Daily  . potassium chloride  20 mEq Oral Daily  . pravastatin  40 mg Oral q1800  . sodium chloride  10-40 mL Intracatheter Q12H  . sodium chloride  3 mL Intravenous Q12H  . tamsulosin  0.4 mg Oral BID    OBJECTIVE  Filed Vitals:   01/04/15 1100 01/04/15 1115 01/04/15 1130 01/04/15 1145  BP: 95/52 96/54 88/66  99/65  Pulse:   83 65  Temp:      TempSrc:      Resp: 22 20 19 21   Height:      Weight:      SpO2:   100% 97%    Intake/Output Summary (Last 24 hours) at 01/04/15 1158 Last data filed at 01/04/15 1030  Gross per 24 hour  Intake 1358.9 ml  Output   1250 ml  Net  108.9 ml   Filed Weights   01/02/15 0500 01/03/15 1415 01/04/15 0500  Weight: 205 lb 14.6 oz (93.4 kg) 215 lb 3.2 oz (97.614 kg) 209 lb 7 oz (95 kg)    PHYSICAL EXAM  General: Pleasant, NAD. Neuro: Alert and oriented X 3. Moves all extremities spontaneously. Psych: Normal  affect. HEENT:  Normal  Neck: Supple without bruits or JVD. Lungs:  Resp regular and unlabored, CTA. Heart: irregular rhythm,  no s3, s4, or murmurs. Abdomen: Soft, non-tender, non-distended, BS + x 4.  Extremities: No clubbing, cyanosis. Trace LE edema. DP/PT/Radials 2+ and equal bilaterally. Wound: Sternal wound healing well. R thigh wound clean and dry. R lower leg incision open with large amount sanguinous drainage   Accessory Clinical Findings  CBC  Recent Labs  01/03/15 0401 01/04/15 0510  WBC 10.1 10.9*  HGB 8.6* 9.5*  HCT 25.3* 27.8*  MCV 91.7 87.7  PLT 176 678   Basic Metabolic Panel  Recent Labs  01/03/15 0401 01/04/15 0510  NA 132* 131*  K 3.4* 3.4*  CL 100* 99*  CO2 26 26  GLUCOSE 114* 100*  BUN 18 18  CREATININE 0.91 0.82  CALCIUM 7.9* 7.7*    TELE  Irregular rate in 90-110s. AFib vs PACs?  Radiology/Studies    ASSESSMENT AND PLAN  1. Post op AFIB with RVR - Looks like he is again back in Afib at rate of 90-100s. Continue PO amio. PVCs and PACs. MD to review.   2. CAD - post CABG  3. Hypotension - BPs sill low. MD to review meds.   Signed, Bhagat,Bhavinkumar PA-C Pager [phone]  I have examined the patient and reviewed assessment and plan and discussed with patient.  Agree with above as stated.  Currently in Atrial flutter.  Rhythm changing to AFib at times.  COntinue Amiodarone for now.  THis should help rhythm normalize.  Adequate rate control at this time.  Borderline BP. Holding antihypertensives.  Amadu Schlageter S.

## 2015-01-04 NOTE — Op Note (Signed)
NAMEKARA, MIERZEJEWSKI NO.:  [id number]  MEDICAL RECORD NO.:  [id number]  LOCATION:  2S10C                        FACILITY:  [facility]  PHYSICIAN:  Lanelle Bal, MD    DATE OF BIRTH:  01-10-1926  DATE OF PROCEDURE:  12/28/2014 DATE OF DISCHARGE:                              OPERATIVE REPORT   PREOPERATIVE DIAGNOSIS:  Severe 3-vessel coronary artery disease.  POSTOPERATIVE DIAGNOSIS:  Severe 3-vessel coronary artery disease.  SURGICAL PROCEDURE:  Coronary artery bypass grafting x6, with left internal mammary to the left anterior descending coronary artery in 2 sites, in the mid vessel and the distal LAD, reverse saphenous vein graft to the diagonal, sequential reverse saphenous vein graft to the first and second obtuse marginal, and reverse saphenous vein graft to the mid posterior descending coronary artery, with right leg greater saphenous vein harvesting from the thigh and calf.  SURGEON:  Lanelle Bal, M.D.  FIRST ASSISTANT:  Airen Barrett, PA  BRIEF HISTORY:  The patient is an 81 year old male, who remains moderately active, who presented with increasing anginal symptoms in spite of medical therapy.  He underwent cardiac catheterization in [place] by Dr. _____ and had significant 3-vessel coronary artery disease, including total occlusion of the right coronary artery and high- grade stenosis in the proximal LAD and stenosis in the mid LAD and subtotal occlusion of this obtuse marginal vessels.  Overall, ventricular function was preserved.  With the patient's significant 3- vessel coronary artery disease and symptoms of artery bypass grafting was recommended to the patient, who agreed and signed informed consent.  DESCRIPTION OF PROCEDURE:  With Swan-Ganz and arterial line monitors in place, the patient underwent general endotracheal anesthesia without incident.  The skin of the chest and legs was prepped with Betadine and draped in usual sterile  manner.  Using the [employer]ant endo vein harvesting system, vein was harvested from the right thigh and calf endoscopically and was of adequate quality and caliber.  Median sternotomy was performed.  The left internal mammary artery was dissected down as pedicle graft.  The distal artery had good free flow of the vessel was hydrostatically dilated with heparinized saline.  The pericardium was opened.  Overall, ventricular function appeared preserved.  The patient was systemically heparinized.  Ascending aorta was cannulated.  The right atrium was cannulated.  An aortic root vent cardioplegia needle was introduced into the ascending aorta.  The patient was placed on cardiopulmonary bypass 2.4 L/min/m2.  Sites of anastomosis were selected and dissected out the epicardium.  The patient's body temperature was cooled to 32 degrees.  The inferior surface of the heart was elevated and the posterior descending coronary artery was identified and the midportion of the vessel had a lumen.  The proximal third of the posterior descending was significantly calcified.  The vessel was opened and a 1 mm probe passed distally.  Using a running 7-0 Prolene, distal anastomosis was performed with a second reverse saphenous vein graft. The heart was elevated and the first and second obtuse marginal vessels were each identified and were of sufficient size to bypass.  The first obtuse marginal was opened and was 1.2-1.3 mm in size.  Using a running 7-0 Prolene,  distal anastomosis was performed, side to side.  The distal extent of the same vein was then carried to the second obtuse marginal, which was slightly smaller using a running 7-0 Prolene, distal anastomosis was performed.  Attention was then turned to the diagonal coronary artery, which was opened and admitted a 1 mm probe distally. Using a running 7-0 Prolene, distal anastomosis was performed.  The LAD was diffusely diseased.  In the midportion of the LAD, we  opened the vessel 1-1/2 probe passed proximally and a 1 probe would not pass distally.  We then decided to sequential the left internal mammary to the mid LAD and distal LAD.  The distal LAD was opened and was passed to 1-1/2 mm probe distally.  The LAD was then anastomosed to the mid left anterior descending coronary artery, with a longitudinal side-to-side anastomosis with a running 8-0 Prolene.  The distal extent of the mammary was then trimmed to the appropriate length and anastomosed to the distal LAD with a running 8-0.  The bulldog on the mammary artery was removed and there was good free flow distally.  The bulldog was placed back on the mammary artery and the fascia in the vicinity of the 2 anastomoses were each tacked to the epicardium with the crossclamp still in place.  Three punch aortotomies were performed and each of the 3 vein grafts were anastomosed to the ascending aorta.  The heart was allowed to passively fill and de-air.  The bulldog was removed from the mammary artery with prompt rise in myocardial septal temperature.  Cross- clamp was removed with the cross-clamp time of 123 minutes.  Sites anastomosed were inspected and were free of bleeding.  The patient was then ventilated and weaned from cardiopulmonary bypass, on low-dose dopamine.  His blood pressure was slightly low and epinephrine was added.  He separated from bypass without difficulty.  He is decannulated in usual fashion and protamine sulfate was administered.  After the protamine was administered, the patient had more than usual amount of bleeding from the leg incision.  A drain had been placed.  The incision was then reopened and explored.  There were several small areas of bleeding, just below the knee.  We replaced a Blake drain in the thigh and reclosed the incision because of low hematocrit and need for volume. The patient did receive packed red blood cells.  Also, platelet count was low with  significant coagulopathy, so platelets and fresh frozen were also administered.  The patient stabilized hemodynamically.  A left pleural tube and 2 Blake drains were left in the pericardium and the pericardium was loosely reapproximated.  The sternum was closed with #6 stainless steel wire.  Fascia closed with interrupted 0 Vicryl, running 3-0 Vicryl subcutaneous tissue, and 3-0 subcuticular stitch in the skin edges.  Dry dressings were applied.  Sponge and needle count was reported as correct at the completion of procedure.  Total pump time was 148 minutes.  The patient tolerated procedure without obvious complication, other than postoperative coagulopathy, requiring packed red blood cells, platelets, and fresh frozen.     Lanelle Bal, MD     EG/MEDQ  D:  01/03/2015  T:  01/03/2015  Job:  [id number]

## 2015-01-05 ENCOUNTER — Encounter
Admission: RE | Admit: 2015-01-05 | Discharge: 2015-01-05 | Disposition: A | Discharge status: Home / Self Care | Payer: Medicare Other | Source: Ambulatory Visit | Attending: Internal Medicine | Admitting: Internal Medicine

## 2015-01-05 DIAGNOSIS — I480 Paroxysmal atrial fibrillation: Secondary | ICD-10-CM | POA: Insufficient documentation

## 2015-01-05 LAB — CBC
HCT: 28.4 % — ABNORMAL LOW (ref 39.0–52.0)
Hemoglobin: 9.9 g/dL — ABNORMAL LOW (ref 13.0–17.0)
MCH: 31.1 pg (ref 26.0–34.0)
MCHC: 34.9 g/dL (ref 30.0–36.0)
MCV: 89.3 fL (ref 78.0–100.0)
Platelets: 243 10*3/uL (ref 150–400)
RBC: 3.18 MIL/uL — ABNORMAL LOW (ref 4.22–5.81)
RDW: 19.6 % — ABNORMAL HIGH (ref 11.5–15.5)
WBC: 9.8 10*3/uL (ref 4.0–10.5)

## 2015-01-05 LAB — BASIC METABOLIC PANEL
Anion gap: 7 (ref 5–15)
BUN: 25 mg/dL — ABNORMAL HIGH (ref 6–20)
CO2: 25 mmol/L (ref 22–32)
Calcium: 7.9 mg/dL — ABNORMAL LOW (ref 8.9–10.3)
Chloride: 99 mmol/L — ABNORMAL LOW (ref 101–111)
Creatinine, Ser: 0.89 mg/dL (ref 0.61–1.24)
GFR calc Af Amer: >60 mL/min (ref >60)
GFR calc non Af Amer: >60 mL/min (ref >60)
Glucose, Bld: 106 mg/dL — ABNORMAL HIGH (ref 65–99)
Potassium: 3.8 mmol/L (ref 3.5–5.1)
Sodium: 131 mmol/L — ABNORMAL LOW (ref 135–145)

## 2015-01-05 LAB — GLUCOSE, CAPILLARY
Glucose-Capillary: 101 mg/dL — ABNORMAL HIGH (ref 65–99)
Glucose-Capillary: 104 mg/dL — ABNORMAL HIGH (ref 65–99)
Glucose-Capillary: 107 mg/dL — ABNORMAL HIGH (ref 65–99)
Glucose-Capillary: 114 mg/dL — ABNORMAL HIGH (ref 65–99)

## 2015-01-05 MED ORDER — SODIUM CHLORIDE 0.9 % IJ SOLN
10.0000 mL | INTRAMUSCULAR | Status: DC | PRN
  Administered 2015-01-05: 30 mL
  Administered 2015-01-06 – 2015-01-08 (×7): 10 mL
  Administered 2015-01-09: 30 mL
  Filled 2015-01-05 (×8): qty 40

## 2015-01-05 MED ORDER — LACTULOSE 10 GM/15ML PO SOLN
20.0000 g | Freq: Every day | ORAL | Status: DC | PRN
Start: 2015-01-05 — End: 2015-01-11
  Administered 2015-01-09: 20 g via ORAL
  Filled 2015-01-05 (×2): qty 30

## 2015-01-05 MED ORDER — ENOXAPARIN SODIUM 40 MG/0.4ML ~~LOC~~ SOLN
40.0000 mg | Freq: Two times a day (BID) | SUBCUTANEOUS | Status: DC
  Administered 2015-01-05: 40 mg via SUBCUTANEOUS
  Filled 2015-01-05 (×4): qty 0.4

## 2015-01-05 NOTE — Clinical Social Work Note (Signed)
Clinical Social Work Assessment  Patient Details  Name: Jacob Reyes MRN: [id number] Date of Birth: 30-Apr-1926  Date of referral:  01/05/15               Reason for consult:  Facility Placement                Permission sought to share information with:  Family Supports, Chartered certified accountant granted to share information::  Yes, Verbal Permission Granted  Name::     Retail buyer::  [facility]  Relationship::  spouse  Contact Information:     Housing/Transportation Living arrangements for the past 2 months:  Single Family Home Source of Information:  Patient Patient Interpreter Needed:  None Criminal Activity/Legal Involvement Pertinent to Current Situation/Hospitalization:  No - Comment as needed Significant Relationships:  Adult Children, Spouse Lives with:  Spouse Do you feel safe going back to the place where you live?  Yes Need for family participation in patient care:  Yes (Comment) (physical support needed at this time)  Care giving concerns:  Pt lives at home with elderly wife who is unable to provide sufficient physical assistance.  Pt is requiring 2 people assist at this time and does not feel safe going home.   Social Worker assessment / plan:  CSW spoke with pt about recommendation for rehab  Employment status:  Retired Nurse, adult PT Recommendations:  [place], [place] / Referral to community resources:  [place]  Patient/Family's Response to care:  Pt and family at bedside are agreeable- understand need for pt to gain strength before returning home  Patient/Family's Understanding of and Emotional Response to Diagnosis, Current Treatment, and Prognosis:  Patient seems to have good understanding of current condition/ prognosis and has no questions or concerns at this time  Emotional Assessment Appearance:  Appears stated  age Attitude/Demeanor/Rapport:    Affect (typically observed):  Appropriate, Pleasant Orientation:  Oriented to Self, Oriented to Place, Oriented to  Time, Oriented to Situation Alcohol / Substance use:  Not Applicable Psych involvement (Current and /or in the community):  No (Comment)  Discharge Needs  Concerns to be addressed:  Care Coordination Readmission within the last 30 days:  No Current discharge risk:  Physical Impairment Barriers to Discharge:  Continued Medical Work up   [employer], LCSW 01/05/2015, 12:44 PM

## 2015-01-05 NOTE — Progress Notes (Signed)
CSW following for SNF placement- pt agreeable- referral faxed out to [facility]- bed offers pending  Full assessment to follow.  Domenica Reamer, [place] Social Worker [phone]

## 2015-01-05 NOTE — Progress Notes (Signed)
1400 Following pt's progress with PT. Graylon Good RN BSN 01/05/2015 1:57 PM

## 2015-01-05 NOTE — Clinical Social Work Placement (Signed)
   CLINICAL SOCIAL WORK PLACEMENT  NOTE  Date:  01/05/2015  Patient Details  Name: Jacob Reyes MRN: [id number] Date of Birth: 1925/12/19  Clinical Social Work is seeking post-discharge placement for this patient at the [place] level of care (*CSW will initial, date and re-position this form in  chart as items are completed):  Yes   Patient/family provided with [place] Work Department's list of facilities offering this level of care within the geographic area requested by the patient (or if unable, by the patient's family).  Yes   Patient/family informed of their freedom to choose among providers that offer the needed level of care, that participate in Medicare, Medicaid or managed care program needed by the patient, have an available bed and are willing to accept the patient.  Yes   Patient/family informed of [facility]'s ownership interest in [facility] and [facility], as well as of the fact that they are under no obligation to receive care at these facilities.  PASRR submitted to EDS on 01/05/15     PASRR number received on 01/05/15     Existing PASRR number confirmed on       FL2 transmitted to all facilities in geographic area requested by pt/family on 01/05/15     FL2 transmitted to all facilities within larger geographic area on       Patient informed that his/her managed care company has contracts with or will negotiate with certain facilities, including the following:            Patient/family informed of bed offers received.  Patient chooses bed at       Physician recommends and patient chooses bed at      Patient to be transferred to   on  .  Patient to be transferred to facility by       Patient family notified on   of transfer.  Name of family member notified:        PHYSICIAN Please sign FL2     Additional Comment:    _______________________________________________ Cranford Mon, LCSW 01/05/2015,  12:54 PM

## 2015-01-05 NOTE — Progress Notes (Signed)
Removed epicardial wires per order. 3 intact.  Pt tolerated procedure well.  Pt instructed to remain on bedrest for one hour.  Frequent vitals will be taken and documented. Pt resting with call bell within reach. Amer Alcindor McClintock, RN   

## 2015-01-05 NOTE — Progress Notes (Addendum)
Subjective:  Pt up in chair, good spirits. He apparently is going to SNF for rehab after discharge.   Objective:  Vital Signs in the last 24 hours: Temp:  [97.8 F (36.6 C)-98.5 F (36.9 C)] 98.5 F (36.9 C) (08/16 0409) Pulse Rate:  [46-138] 78 (08/16 0409) Resp:  [13-31] 18 (08/16 0409) BP: (73-118)/(46-101) 115/68 mmHg (08/16 0409) SpO2:  [82 %-100 %] 100 % (08/16 0409) Weight:  [215 lb 13.3 oz (97.9 kg)] 215 lb 13.3 oz (97.9 kg) (08/16 0409)  Intake/Output from previous day:  Intake/Output Summary (Last 24 hours) at 01/05/15 0949 Last data filed at 01/05/15 0830  Gross per 24 hour  Intake  405.7 ml  Output   1050 ml  Net -644.3 ml    Physical Exam: General appearance: alert, cooperative and no distress Lungs: decreased bases, Rt > Lt Heart: irregularly irregular rhythm Extremities: 1+ edema   Rate: 80  Rhythm: atrial flutter  Lab Results:  Recent Labs  01/04/15 0510 01/05/15 0440  WBC 10.9* 9.8  HGB 9.5* 9.9*  PLT 220 243    Recent Labs  01/04/15 0510 01/05/15 0440  NA 131* 131*  K 3.4* 3.8  CL 99* 99*  CO2 26 25  GLUCOSE 100* 106*  BUN 18 25*  CREATININE 0.82 0.89   No results for input(s): TROPONINI in the last 72 hours.  Invalid input(s): CK, MB No results for input(s): INR in the last 72 hours.  Scheduled Meds: . amiodarone  200 mg Oral Q12H   Followed by  . [START ON 01/07/2015] amiodarone  200 mg Oral Daily  . aspirin EC  81 mg Oral Daily  . brimonidine  1 drop Both Eyes BID   And  . timolol  1 drop Both Eyes BID  . brimonidine  1 drop Both Eyes BID   And  . timolol  1 drop Both Eyes BID  . docusate sodium  200 mg Oral Daily  . enoxaparin (LOVENOX) injection  30 mg Subcutaneous Q24H  . finasteride  5 mg Oral QPC supper  . furosemide  20 mg Oral Daily  . insulin aspart  0-24 Units Subcutaneous TID AC & HS  . magnesium chloride  2 tablet Oral Daily  . pantoprazole  40 mg Oral QAC breakfast  . polyethylene glycol  17 g Oral  Daily  . potassium chloride  20 mEq Oral Daily  . pravastatin  40 mg Oral q1800  . sodium chloride  3 mL Intravenous Q12H  . tamsulosin  0.4 mg Oral BID   Continuous Infusions:  PRN Meds:.sodium chloride, albuterol, bisacodyl **OR** bisacodyl, lactulose, oxyCODONE, sodium chloride, traMADol   Imaging: Dg Chest Port 1 View  01/04/2015   CLINICAL DATA:  Status post CABG 12/28/2014.  EXAM: PORTABLE CHEST - 1 VIEW  COMPARISON:  Single view of the chest 01/02/2015 and 01/01/2015.  FINDINGS: Right PICC remains in place. Small bilateral pleural effusions and basilar atelectasis have improved. Heart size is upper normal. No pulmonary edema. No pneumothorax.  IMPRESSION: Some improvement in small bilateral pleural effusions and basilar atelectasis. No new abnormality.   Electronically Signed   By: Inge Rise M.D.   On: 01/04/2015 07:40    Cardiac Studies: Echo 01/01/15 Study Conclusions  - Left ventricle: The cavity size was normal. Wall thickness was normal. Systolic function was vigorous. The estimated ejection fraction was in the range of 65% to 70%. Wall motion was normal; there were no regional wall motion abnormalities. Features are consistent  with a pseudonormal left ventricular filling pattern, with concomitant abnormal relaxation and increased filling pressure (grade 2 diastolic dysfunction).  Assessment/Plan:  81 y.o. male seen by Dr Ubaldo Glassing in July after the pt had had an abnormal stress echo for complaints of chest pain. Cath 12/10/14 revealed 4 V CAD with preserved LVF. The pt was referred to Dr Servando Snare and on 12/28/14 underwent CABG x 6. Post op he had AF with RVR and Amiodarone was started. He remains in AF and A flutter with CVR.   Active Problems:   S/P CABG x 6   Atrial fibrillation with RVR post op   Hypotension post op   Cancer of colon- remote surgery   Dyslipidemia   GERD (gastroesophageal reflux disease)   PLAN: CHADs VASc = 3 for age, vascular history.  Consider anticoagulation. Pt will follow up with Dr Bartholome Bill in [place] after discharge, please send Dr Ubaldo Glassing a copy of DC summary.   Kerin Ransom PA-C 01/05/2015, 9:49 AM [phone]  I have examined the patient and reviewed assessment and plan and discussed with patient.  Agree with above as stated.  I think he is a reasonable candidate for anticoagulation if ok with the surgery team.  His HAS-BLED score is 1, due to his age.  This gives him a low risk for a major bleed.  His CHADs score warrants anticoagulation. Would consider NOAC.    Previously AFib, now Still in atrial flutter.  Cannot be cardioverted since he has not been anticoagulated.  Rate controlled at this time. Not symptomatic.   Discussed with the family at the bedside.   Gretta Samons S.

## 2015-01-05 NOTE — Progress Notes (Addendum)
      [address]       [place] [id number]             [phone]      8 Days Post-Op Procedure(s) (LRB): CORONARY ARTERY BYPASS GRAFTING (CABG), ON PUMP, TIMES SIX, USING LEFT INTERNAL MAMMARY ARTERY, RIGHT GREATER SAPHENOUS VEIN HARVESTED ENDOSCOPICALLY (N/A) TRANSESOPHAGEAL ECHOCARDIOGRAM (TEE) (N/A)   Subjective:  Jacob Reyes has some mild constipation.  He states he hasn't moved his bowels since Saturday.  He is not ambulating much as he is a max assist.   He will need SNF at discharge  Objective: Vital signs in last 24 hours: Temp:  [97.8 F (36.6 C)-98.5 F (36.9 C)] 98.5 F (36.9 C) (08/16 0409) Pulse Rate:  [46-138] 78 (08/16 0409) Cardiac Rhythm:  [-] Atrial fibrillation;Atrial flutter (08/15 1930) Resp:  [13-31] 18 (08/16 0409) BP: (73-118)/(46-101) 115/68 mmHg (08/16 0409) SpO2:  [82 %-100 %] 100 % (08/16 0409) Weight:  [215 lb 13.3 oz (97.9 kg)] 215 lb 13.3 oz (97.9 kg) (08/16 0409)  Intake/Output from previous day: 08/15 0701 - 08/16 0700 In: 220.7 [I.V.:220.7] Out: 1050 [Urine:1050]  General appearance: alert, cooperative and no distress Heart: irregularly irregular rhythm Lungs: clear to auscultation bilaterally Abdomen: soft, non-tender; bowel sounds normal; no masses,  no organomegaly Extremities: 1+ Wound: clean and dry  Lab Results:  Recent Labs  01/04/15 0510 01/05/15 0440  WBC 10.9* 9.8  HGB 9.5* 9.9*  HCT 27.8* 28.4*  PLT 220 243   BMET:  Recent Labs  01/04/15 0510 01/05/15 0440  NA 131* 131*  K 3.4* 3.8  CL 99* 99*  CO2 26 25  GLUCOSE 100* 106*  BUN 18 25*  CREATININE 0.82 0.89  CALCIUM 7.7* 7.9*    PT/INR: No results for input(s): LABPROT, INR in the last 72 hours. ABG    Component Value Date/Time   PHART 7.418 12/29/2014 0328   HCO3 20.2 12/29/2014 0328   TCO2 20 12/29/2014 1618   ACIDBASEDEF 4.0* 12/29/2014 0328   O2SAT 93.0 12/29/2014 0328   CBG (last 3)   Recent Labs  01/04/15 1627  01/04/15 2142 01/05/15 0625  GLUCAP 105* 114* 101*    Assessment/Plan: S/P Procedure(s) (LRB): CORONARY ARTERY BYPASS GRAFTING (CABG), ON PUMP, TIMES SIX, USING LEFT INTERNAL MAMMARY ARTERY, RIGHT GREATER SAPHENOUS VEIN HARVESTED ENDOSCOPICALLY (N/A) TRANSESOPHAGEAL ECHOCARDIOGRAM (TEE) (N/A)  1. CV- Atrial Fibrillation, rate controlled, off pressors, BP remains borderline- will continue Amiodarone, no Beta Blocker at this time. 2. Pulm- no acute issues, continue IS 3. Renal- potassium improved, but remains on the low end, continue to supplement, continue Lasix 4. GI- LOC constipation, will order Lactulose 5. Deconditioning- continue PT/OT, needs SNF at discharge..... Place social work consult 6. Dispo- remains hypotensive, continue to hold beta blocker, continue A. Fib....  will place social work consult for SNF placement   LOS: 8 days    BARRETT, ERIN 01/05/2015  Bruising of right leg slowly improving, on lovenox now lower dose Consider anticoagulation with eliquis (over 80 but renal function normal and not small) Not ready for d/c yet I have seen and examined Jacob Reyes and agree with the above assessment  and plan.  [name] MD Beeper [phone] Office [phone] 01/05/2015 6:44 PM

## 2015-01-05 NOTE — Progress Notes (Signed)
Physical Therapy Treatment Patient Details Name: Jacob Reyes MRN: [id number] DOB: [birth year] Today's Date: 01/05/2015    History of Present Illness Adm 8/8 and underwent CABG x 6; post-op afib and hypotension requiring pressors; 8/11 AM found on floor by nsg  PMHx- colon Ca; bil HOH; HTN; anxiety    PT Comments    Patient very motivated, however remains very weak. Was able to ambulate 10 ft twice (seated rest x 4 minutes between).   Follow Up Recommendations  Supervision/Assistance - 24 hour;SNF (Inpt rehab not an option due to insurance)     Equipment Recommendations  Rolling walker with 5" wheels    Recommendations for Other Services OT consult     Precautions / Restrictions Precautions Precautions: Sternal;Fall Precaution Comments: able to describe precautions and reason why Restrictions Weight Bearing Restrictions:  (sternal precautions)    Mobility  Bed Mobility               General bed mobility comments: sitting up in recliner  Transfers Overall transfer level: Needs assistance Equipment used: Rolling walker (2 wheeled) Transfers: Sit to/from Stand Sit to Stand: Mod assist;From elevated surface;+2 physical assistance;+2 safety/equipment;Min assist (pillow in seat of recliner)         General transfer comment: x 3; using rocking/momentum; required incr assist each time as he fatigued; vc to maintian precautions  Ambulation/Gait Ambulation/Gait assistance: Min assist;+2 safety/equipment Ambulation Distance (Feet): 10 Feet (seated rest, 10) Assistive device: Rolling walker (2 wheeled) (CLOSE follow with chair) Gait Pattern/deviations: Step-through pattern;Decreased stride length;Trunk flexed Gait velocity: decr, but imkproved   General Gait Details: unsteady, feels weak; denies dizziness   Stairs            Wheelchair Mobility    Modified Rankin (Stroke Patients Only)       Balance     Sitting balance-Leahy Scale: Fair        Standing balance-Leahy Scale: Poor                      Cognition Arousal/Alertness: Awake/alert Behavior During Therapy: WFL for tasks assessed/performed Overall Cognitive Status: Within Functional Limits for tasks assessed                      Exercises General Exercises - Upper Extremity Elbow Flexion: AROM;Both;Seated;10 reps Elbow Extension: AROM;Both;Seated;10 reps Digit Composite Flexion: AROM;Both;15 reps;Seated General Exercises - Lower Extremity Hip Flexion/Marching: AAROM;Both;5 reps;Standing Heel Raises: AROM;Both;10 reps;Seated    General Comments        Pertinent Vitals/Pain BP seated 93/72       Stand  104/67  Pain Assessment: Faces Faces Pain Scale: Hurts little more Pain Location: Rt lower leg Pain Intervention(s): Limited activity within patient's tolerance;Monitored during session;Repositioned    Home Living                      Prior Function            PT Goals (current goals can now be found in the care plan section) Acute Rehab PT Goals Patient Stated Goal: regain his strength "i'm so weak" PT Goal Formulation: With patient Time For Goal Achievement: 01/07/15 Potential to Achieve Goals: Good Progress towards PT goals: Progressing toward goals    Frequency  Min 3X/week    PT Plan Discharge plan needs to be updated    Co-evaluation             End of Session Equipment Utilized During  Treatment: Gait belt Activity Tolerance: Patient tolerated treatment well Patient left: with call bell/phone within reach (on [facility]; NT notified)     Time: 6644-0347 PT Time Calculation (min) (ACUTE ONLY): 20 min  Charges:  $Gait Training: 8-22 mins                    G Codes:      Maryalice Pasley January 14, 2015, 2:42 PM  Pager [phone]

## 2015-01-06 DIAGNOSIS — Z7901 Long term (current) use of anticoagulants: Secondary | ICD-10-CM

## 2015-01-06 LAB — GLUCOSE, CAPILLARY: Glucose-Capillary: 101 mg/dL — ABNORMAL HIGH (ref 65–99)

## 2015-01-06 MED ORDER — POTASSIUM CHLORIDE CRYS ER 20 MEQ PO TBCR: 20.0000 meq | EXTENDED_RELEASE_TABLET | Freq: Every day | ORAL | Status: DC

## 2015-01-06 MED ORDER — APIXABAN 5 MG PO TABS
5.0000 mg | ORAL_TABLET | Freq: Two times a day (BID) | ORAL | Status: DC
  Administered 2015-01-06 – 2015-01-11 (×11): 5 mg via ORAL
  Filled 2015-01-06 (×16): qty 1

## 2015-01-06 NOTE — Progress Notes (Signed)
Physical Therapy Treatment Patient Details Name: Jacob Reyes MRN: [id number] DOB: 05-Jun-1925 Today's Date: 01/06/2015    History of Present Illness Admitted 8/8 and underwent CABG x 6; post-op afib and hypotension requiring pressors; 8/11 AM found on floor by nsg  PMHx- colon Ca; bil HOH; HTN; anxiety    PT Comments    Patient is progressing nicely today. Fatigued but motivated to walk with PT. Patient had completed cardiac rehab earlier today and OT showing more activity tolerance. Continue to recommend SNF for ongoing Physical Therapy.     Follow Up Recommendations  Supervision/Assistance - 24 hour;SNF     Equipment Recommendations  Rolling walker with 5" wheels    Recommendations for Other Services       Precautions / Restrictions Precautions Precautions: Sternal;Fall Precaution Comments: reviewed precautions Restrictions Weight Bearing Restrictions: Yes (sternal precautions)    Mobility  Bed Mobility               General bed mobility comments: sitting in recliner chair  Transfers Overall transfer level: Needs assistance Equipment used: Rolling walker (2 wheeled) Transfers: Sit to/from Stand Sit to Stand: Mod assist;+2 physical assistance         General transfer comment: cues for technique. Used rocking technique   Ambulation/Gait Ambulation/Gait assistance: Min assist;+2 safety/equipment Ambulation Distance (Feet): 150 Feet Assistive device: Rolling walker (2 wheeled) Gait Pattern/deviations: Step-to pattern;Step-through pattern;Shuffle;Trunk flexed   Gait velocity interpretation: Below normal speed for age/gender General Gait Details: Patient mildly unsteady. Cues for safe positioning of RW. +2 for chair follow   Stairs            Wheelchair Mobility    Modified Rankin (Stroke Patients Only)       Balance                                    Cognition Arousal/Alertness: Awake/alert Behavior During Therapy: WFL  for tasks assessed/performed Overall Cognitive Status: Within Functional Limits for tasks assessed                      Exercises      General Comments        Pertinent Vitals/Pain Pain Assessment: 0-10 Pain Score: 7  Pain Location: RLE Pain Descriptors / Indicators: Aching;Sore Pain Intervention(s): Monitored during session    Home Living                      Prior Function            PT Goals (current goals can now be found in the care plan section) Acute Rehab PT Goals Patient Stated Goal: not stated Progress towards PT goals: Progressing toward goals    Frequency  Min 3X/week    PT Plan Current plan remains appropriate    Co-evaluation             End of Session   Activity Tolerance: Patient tolerated treatment well Patient left: in chair;with call bell/phone within reach     Time: 1406-1430 PT Time Calculation (min) (ACUTE ONLY): 24 min  Charges:  $Gait Training: 8-22 mins $Therapeutic Activity: 8-22 mins                    G Codes:      Jacqualyn Posey 01/06/2015, 3:17 PM  01/06/2015 Jacqualyn Posey PTA [phone] pager [phone] office

## 2015-01-06 NOTE — Progress Notes (Addendum)
      [address]       [place] [address]             [phone]      9 Days Post-Op Procedure(s) (LRB): CORONARY ARTERY BYPASS GRAFTING (CABG), ON PUMP, TIMES SIX, USING LEFT INTERNAL MAMMARY ARTERY, RIGHT GREATER SAPHENOUS VEIN HARVESTED ENDOSCOPICALLY (N/A) TRANSESOPHAGEAL ECHOCARDIOGRAM (TEE) (N/A)   Subjective:  Jacob Reyes has no new complaints.  He remains motivated, but cant get over how weak his is when he stands up.  He thinks this is mostly due to discomfort in his right leg.  I encouraged patient to keep trying and stay motivated and he will notice he is a little stronger each week.  + BM  Objective: Vital signs in last 24 hours: Temp:  [97.9 F (36.6 C)-98.5 F (36.9 C)] 97.9 F (36.6 C) (08/17 0502) Pulse Rate:  [78-92] 92 (08/17 0502) Cardiac Rhythm:  [-] Atrial flutter;Atrial fibrillation (08/17 0502) Resp:  [18] 18 (08/17 0502) BP: (92-122)/(60-80) 96/63 mmHg (08/17 0502) SpO2:  [93 %-100 %] 98 % (08/17 0502) Weight:  [213 lb 9.6 oz (96.888 kg)] 213 lb 9.6 oz (96.888 kg) (08/17 0509)  Intake/Output from previous day: 08/16 0701 - 08/17 0700 In: 480 [P.O.:480] Out: 600 [Urine:600]  General appearance: alert, cooperative and no distress Heart: irregularly irregular rhythm Lungs: clear to auscultation bilaterally Abdomen: soft, non-tender; bowel sounds normal; no masses,  no organomegaly Extremities: edema 1+ bilaterally, ecchymosis RLE Wound: clena and dry, sutures remain in place  Lab Results:  Recent Labs  01/04/15 0510 01/05/15 0440  WBC 10.9* 9.8  HGB 9.5* 9.9*  HCT 27.8* 28.4*  PLT 220 243   BMET:  Recent Labs  01/04/15 0510 01/05/15 0440  NA 131* 131*  K 3.4* 3.8  CL 99* 99*  CO2 26 25  GLUCOSE 100* 106*  BUN 18 25*  CREATININE 0.82 0.89  CALCIUM 7.7* 7.9*    PT/INR: No results for input(s): LABPROT, INR in the last 72 hours. ABG    Component Value Date/Time   PHART 7.418 12/29/2014 0328   HCO3 20.2  12/29/2014 0328   TCO2 20 12/29/2014 1618   ACIDBASEDEF 4.0* 12/29/2014 0328   O2SAT 93.0 12/29/2014 0328   CBG (last 3)   Recent Labs  01/05/15 1606 01/05/15 2120 01/06/15 0648  GLUCAP 107* 114* 101*    Assessment/Plan: S/P Procedure(s) (LRB): CORONARY ARTERY BYPASS GRAFTING (CABG), ON PUMP, TIMES SIX, USING LEFT INTERNAL MAMMARY ARTERY, RIGHT GREATER SAPHENOUS VEIN HARVESTED ENDOSCOPICALLY (N/A) TRANSESOPHAGEAL ECHOCARDIOGRAM (TEE) (N/A)  1. CV- Atrial Fibrillation- remains on Amiodarone, unable to tolerate beta blocker due to hypotension- will start Eliquis, stop Loveox 2. Pulm- no acute issues, continue IS 3. Renal- creatinine has been WNL, has had some hypokalemia- continue Lasix, potassium supplement will repeat BMET/CBC in AM 4. GI- LOC constipation resolved after lactulose continue Miralax, stool softners 5. Deconditioning PT/OT, needs SNF at discharge 6. Dispo- patient with continued Atrial Fibrillation will start Eliquis, recheck BMET/CBC in AM.... Not ready for SNF, likely Monday   LOS: 9 days    BARRETT, ERIN 01/06/2015  I have seen and examined Jacob Reyes and agree with the above assessment  and plan.  [name] MD Beeper [phone] Office [phone] 01/06/2015 2:27 PM

## 2015-01-06 NOTE — Progress Notes (Signed)
CARDIAC REHAB PHASE I   PRE:  Rate/Rhythm: 99 afib/flutter  BP:  Supine: 109/75  Sitting: 102/79  Standing: 110/87   SaO2: 99%RA  MODE:  Ambulation: 150 ft   POST:  Rate/Rhythm: up to 149 afib, rest 99-109 afib  BP:  Supine:   Sitting: 127/76  Standing:    SaO2: 95%RA 1046-1122 Pt very motivated to walk. Orthostatics as above. Pt walked 150 ft on RA with gait belt use, rolling walker and asst x 1 with one asst to follow with recliner. Pt denied dizziness. Did have some DOE and stopped a couple of times to rest. Encouraged pursed lip breathing. Pt did not need to sit. Pt did well rocking to stand.  Heart rate to as high as 149 during walk. To 99 after resting in recliner. Congratulated pt on the distance. Encouraged walking with staff.    Graylon Good, RN BSN  01/06/2015 11:18 AM

## 2015-01-06 NOTE — Progress Notes (Signed)
ANTICOAGULATION CONSULT NOTE - Initial Consult  Pharmacy Consult for Eliquis Indication: afib/flutter  No Known Allergies  Patient Measurements: Height: 6\' 1"  (185.4 cm) Weight: 213 lb 9.6 oz (96.888 kg) IBW/kg (Calculated) : 79.9 Heparin Dosing Weight:    Vital Signs: Temp: 97.9 F (36.6 C) (08/17 0502) Temp Source: Oral (08/17 0502) BP: 96/63 mmHg (08/17 0502) Pulse Rate: 92 (08/17 0502)  Labs:  Recent Labs  01/04/15 0510 01/05/15 0440  HGB 9.5* 9.9*  HCT 27.8* 28.4*  PLT 220 243  CREATININE 0.82 0.89    Estimated Creatinine Clearance: 71.7 mL/min (by C-G formula based on Cr of 0.89).   Medical History: Past Medical History  Diagnosis Date  . Glaucoma   . Hearing loss   . Reflux   . GERD (gastroesophageal reflux disease)   . Cancer     prostate  . Colon cancer   . Coronary artery disease   . Hypertension   . Abnormal stress test   . Depression   . Anxiety     just started med. for anxiety   . History of hiatal hernia     Medications:  Prescriptions prior to admission  Medication Sig Dispense Refill Last Dose  . aspirin EC 81 MG tablet Take 81 mg by mouth daily.   12/27/2014 at Unknown time  . brimonidine-timolol (COMBIGAN) 0.2-0.5 % ophthalmic solution Place 1 drop into both eyes 2 (two) times daily.   12/27/2014 at Unknown time  . CALCIUM-MAGNESIUM-VITAMIN D PO Take 1 tablet by mouth daily.   Past Week at Unknown time  . cholecalciferol (VITAMIN D) 1000 UNITS tablet Take 1,000 Units by mouth daily.   Past Week at Unknown time  . finasteride (PROSCAR) 5 MG tablet Take 5 mg by mouth daily after supper.    12/27/2014 at Unknown time  . lovastatin (MEVACOR) 40 MG tablet Take 40 mg by mouth daily after supper.    12/27/2014 at Unknown time  . LUMIGAN 0.01 % SOLN Place 1 drop into both eyes at bedtime.    12/27/2014 at Unknown time  . Magnesium 300 MG CAPS Take 300 mg by mouth daily.     . Melatonin 5 MG TABS Take 5 mg by mouth at bedtime as needed (sleep).    12/27/2014 at Unknown time  . metoprolol tartrate (LOPRESSOR) 25 MG tablet Take 1 tablet (25 mg total) by mouth 2 (two) times daily. 60 tablet 3 12/28/2014 at Unknown time  . omeprazole (PRILOSEC) 20 MG capsule Take 20 mg by mouth daily before breakfast.    12/28/2014 at Unknown time  . PARoxetine (PAXIL) 10 MG tablet Take 10 mg by mouth at bedtime as needed.   12/27/2014 at Unknown time  . polyethylene glycol powder (GLYCOLAX/MIRALAX) powder Take 17 g by mouth daily.    Taking  . tamsulosin (FLOMAX) 0.4 MG CAPS capsule Take 0.4 mg by mouth 2 (two) times daily.    12/27/2014 at Unknown time    Assessment: 81 y/o F c/o CP in July>>abnormal stress echo>>Cath 7/21 with 4V CAD>>12/28/14 CABG x 6>>post-op afib/flutter.  Anticoagulation: Post-CABG afib/flutter. Start Eliquis. Post-op anemia with Hgb 9.9.   Goal of Therapy:  Therapeutic oral anticoagulation Monitor platelets by anticoagulation protocol: Yes   Plan:  SNF for rehab Eliquis 5mg  BID   Sang Blount S. Alford Highland, PharmD, BCPS Clinical Staff Pharmacist Pager [phone]  Eilene Ghazi [address]illing[address] 81 01/06/2015,9:08 AM

## 2015-01-06 NOTE — Progress Notes (Signed)
Subjective:  Pt up in chair, good spirits. He apparently is going to SNF for rehab after discharge.   Objective:  Vital Signs in the last 24 hours: Temp:  [97.9 F (36.6 C)-98.5 F (36.9 C)] 97.9 F (36.6 C) (08/17 0502) Pulse Rate:  [78-92] 92 (08/17 0502) Resp:  [18] 18 (08/17 0502) BP: (92-122)/(60-80) 96/63 mmHg (08/17 0502) SpO2:  [93 %-100 %] 98 % (08/17 0502) Weight:  [213 lb 9.6 oz (96.888 kg)] 213 lb 9.6 oz (96.888 kg) (08/17 0509)  Intake/Output from previous day:  Intake/Output Summary (Last 24 hours) at 01/06/15 1101 Last data filed at 01/06/15 1054  Gross per 24 hour  Intake    510 ml  Output    875 ml  Net   -365 ml    Physical Exam: General appearance: alert, cooperative and no distress Lungs: decreased bases, Rt > Lt Heart: irregularly irregular rhythm Extremities: 1+ edema   Rate: 80-100  Rhythm: atrial flutter  Lab Results:  Recent Labs  01/04/15 0510 01/05/15 0440  WBC 10.9* 9.8  HGB 9.5* 9.9*  PLT 220 243    Recent Labs  01/04/15 0510 01/05/15 0440  NA 131* 131*  K 3.4* 3.8  CL 99* 99*  CO2 26 25  GLUCOSE 100* 106*  BUN 18 25*  CREATININE 0.82 0.89   No results for input(s): TROPONINI in the last 72 hours.  Invalid input(s): CK, MB No results for input(s): INR in the last 72 hours.  Scheduled Meds: . amiodarone  200 mg Oral Q12H   Followed by  . [START ON 01/07/2015] amiodarone  200 mg Oral Daily  . apixaban  5 mg Oral Q12H  . aspirin EC  81 mg Oral Daily  . brimonidine  1 drop Both Eyes BID   And  . timolol  1 drop Both Eyes BID  . docusate sodium  200 mg Oral Daily  . finasteride  5 mg Oral QPC supper  . furosemide  20 mg Oral Daily  . magnesium chloride  2 tablet Oral Daily  . pantoprazole  40 mg Oral QAC breakfast  . polyethylene glycol  17 g Oral Daily  . potassium chloride  20 mEq Oral Daily  . pravastatin  40 mg Oral q1800  . sodium chloride  3 mL Intravenous Q12H  . tamsulosin  0.4 mg Oral BID    Continuous Infusions:  PRN Meds:.sodium chloride, albuterol, bisacodyl **OR** bisacodyl, lactulose, oxyCODONE, sodium chloride, sodium chloride, traMADol   Imaging: No results found.  Cardiac Studies: Echo 01/01/15 Study Conclusions  - Left ventricle: The cavity size was normal. Wall thickness was normal. Systolic function was vigorous. The estimated ejection fraction was in the range of 65% to 70%. Wall motion was normal; there were no regional wall motion abnormalities. Features are consistent with a pseudonormal left ventricular filling pattern, with concomitant abnormal relaxation and increased filling pressure (grade 2 diastolic dysfunction).  Assessment/Plan:  81 y.o. male seen by Dr Ubaldo Glassing in July after the pt had had an abnormal stress echo for complaints of chest pain. Cath 12/10/14 revealed 4 V CAD with preserved LVF. The pt was referred to Dr Servando Snare and on 12/28/14 underwent CABG x 6. Post op he had AF with RVR and Amiodarone was started. He remains in AF and A flutter with CVR.   Active Problems:   S/P CABG x 6   Atrial fibrillation with RVR post op   Hypotension post op   Anticoagulated-Eliquis added post CABG  Dyslipidemia   PLAN:   CHADs VASc = 3 for age, vascular history. Eliquis added, Amiodarone taper ordered.  Pt will follow up with Dr Bartholome Bill in [place] after discharge, please send Dr Ubaldo Glassing a copy of DC summary. We can follow peripherally.   Kerin Ransom PA-C 01/06/2015, 11:01 AM [phone]   I have examined the patient and reviewed assessment and plan and discussed with patient.  Agree with above as stated.  Eliquis for stoke prevention.  Explained risk benefit ratio of eliquis to the patient and family.  Tykeria Wawrzyniak S.

## 2015-01-06 NOTE — Progress Notes (Addendum)
Occupational Therapy Treatment Patient Details Name: Jacob Reyes MRN: [id number] DOB: 1925/06/05 Today's Date: 01/06/2015    History of present illness Admitted 8/8 and underwent CABG x 6; post-op afib and hypotension requiring pressors; 8/11 AM found on floor by nsg  PMHx- colon Ca; bil HOH; HTN; anxiety   OT comments  Pt very pleasant. Continue to recommend SNF.  Follow Up Recommendations  SNF;Supervision/Assistance - 24 hour    Equipment Recommendations  None recommended by OT    Recommendations for Other Services      Precautions / Restrictions Precautions Precautions: Sternal;Fall Precaution Comments: reviewed precautions Restrictions Weight Bearing Restrictions: Yes (sternal precautions)       Mobility Bed Mobility               General bed mobility comments: sitting in recliner chair  Transfers Overall transfer level: Needs assistance Equipment used: Rolling walker (2 wheeled) Transfers: Sit to/from Stand Sit to Stand: Max assist;+2 physical assistance         General transfer comment: cues for technique. Used rocking technique     Balance    Min assist to steady during ambulation-used RW.                               ADL Overall ADL's : Needs assistance/impaired         Upper Body Bathing: Set up;Supervision/ safety;Sitting   Lower Body Bathing: Minimal assistance;Sit to/from stand (2 people with pt, but feel could have performed +1 assist) Lower Body Bathing Details (indicate cue type and reason): son assisted in washing pt's bottom-did not wash feet Upper Body Dressing : Set up;Supervision/safety;Sitting Upper Body Dressing Details (indicate cue type and reason): assisted in doffing gown, but feel pt could manage donning/doffing shirt-OT tied gown and had pt don it and OT gave cues-pt did well.      Toilet Transfer: Minimal assistance;+2 for safety/equipment;Ambulation;RW           Functional mobility during ADLs:  Minimal assistance;Rolling walker;+2 for safety/equipment General ADL Comments: Discussed incorporating sternal precautions into functional activities.       Vision                     Perception     Praxis      Cognition  Awake/Alert Behavior During Therapy: WFL for tasks assessed/performed Overall Cognitive Status: Within Functional Limits for tasks assessed                       Extremity/Trunk Assessment               Exercises     Shoulder Instructions       General Comments      Pertinent Vitals/ Pain       Pain Assessment: 0-10 Pain Score: 7  Pain Location: RLE Pain Descriptors / Indicators: Sore Pain Intervention(s): Monitored during session   HR up to 130s in session.  Home Living                                          Prior Functioning/Environment              Frequency Min 2X/week     Progress Toward Goals  OT Goals(current goals can now be found in the care plan section)  Progress towards OT goals: Progressing toward goals-updated some goals  Acute Rehab OT Goals Patient Stated Goal: not stated OT Goal Formulation: With patient Time For Goal Achievement: 01/18/15 Potential to Achieve Goals: Good ADL Goals Pt Will Perform Grooming: with min assist;standing Pt Will Perform Upper Body Bathing: with set-up;sitting Pt Will Perform Lower Body Bathing: sit to/from stand;with min assist Pt Will Perform Upper Body Dressing: with set-up;sitting Pt Will Perform Lower Body Dressing: with min assist;sit to/from stand Pt Will Transfer to Toilet: with min assist;ambulating;regular height toilet;bedside commode;grab bars Pt Will Perform Toileting - Clothing Manipulation and hygiene: with min assist;sit to/from stand  Plan Discharge plan remains appropriate    Co-evaluation                 End of Session Equipment Utilized During Treatment: Gait belt;Rolling walker   Activity Tolerance Patient  tolerated treatment well   Patient Left in chair;with call bell/phone within reach;with family/visitor present   Nurse Communication          Time: 6606-0045 OT Time Calculation (min): 20 min  Charges: OT General Charges $OT Visit: 1 Procedure OT Treatments $Self Care/Home Management : 8-22 mins  Benito Mccreedy OTR/L [phone] 01/06/2015, 12:43 PM

## 2015-01-06 NOTE — Discharge Instructions (Addendum)
We ask the SN[facility] to please do the following:  1. Please obtain vital signs at least one time daily 2.Please weigh the patient daily. If he or she continues to gain weight or develops lower extremity edema, contact the office at [phone]. 3. Ambulate patient at least three times daily and please use sternal precautions.    Information on my medicine - ELIQUIS (apixaban)  This medication education was reviewed with me or my healthcare representative as part of my discharge preparation.  The pharmacist that spoke with me during my hospital stay was:  Wayland Salinas, [facility]  Why was Eliquis prescribed for you? Eliquis was prescribed for you to reduce the risk of a blood clot forming that can cause a stroke if you have a medical condition called atrial fibrillation (a type of irregular heartbeat).  What do You need to know about Eliquis ? Take your Eliquis TWICE DAILY - one tablet in the morning and one tablet in the evening with or without food. If you have difficulty swallowing the tablet whole please discuss with your pharmacist how to take the medication safely.  Take Eliquis exactly as prescribed by your doctor and DO NOT stop taking Eliquis without talking to the doctor who prescribed the medication.  Stopping may increase your risk of developing a stroke.  Refill your prescription before you run out.  After discharge, you should have regular check-up appointments with your healthcare provider that is prescribing your Eliquis.  In the future your dose may need to be changed if your kidney function or weight changes by a significant amount or as you get older.  What do you do if you miss a dose? If you miss a dose, take it as soon as you remember on the same day and resume taking twice daily.  Do not take more than one dose of ELIQUIS at the same time to make up a missed dose.  Important Safety Information A possible side effect of Eliquis is bleeding. You should  call your healthcare provider right away if you experience any of the following: ? Bleeding from an injury or your nose that does not stop. ? Unusual colored urine (red or dark brown) or unusual colored stools (red or black). ? Unusual bruising for unknown reasons. ? A serious fall or if you hit your head (even if there is no bleeding).  Some medicines may interact with Eliquis and might increase your risk of bleeding or clotting while on Eliquis. To help avoid this, consult your healthcare provider or pharmacist prior to using any new prescription or non-prescription medications, including herbals, vitamins, non-steroidal anti-inflammatory drugs (NSAIDs) and supplements.  This website has more information on Eliquis (apixaban): http://www.eliquis.com/eliquis/home  Coronary Artery Bypass Grafting, Care After Refer to this sheet in the next few weeks. These instructions provide you with information on caring for yourself after your procedure. Your health care provider may also give you more specific instructions. Your treatment has been planned according to current medical practices, but problems sometimes occur. Call your health care provider if you have any problems or questions after your procedure. WHAT TO EXPECT AFTER THE PROCEDURE Recovery from surgery will be different for everyone. Some people feel well after 3 or 4 weeks, while for others it takes longer. After your procedure, it is typical to have the following:  Nausea and a lack of appetite.   Constipation.  Weakness and fatigue.   Depression or irritability.   Pain or discomfort at your incision site.  HOME CARE INSTRUCTIONS  Take medicines only as directed by your health care provider. Do not stop taking medicines or start any new medicines without first checking with your health care provider.  Take your pulse as directed by your health care provider.  Perform deep breathing as directed by your health care provider.  If you were given a device called an incentive spirometer, use it to practice deep breathing several times a day. Support your chest with a pillow or your arms when you take deep breaths or cough.  Keep incision areas clean, dry, and protected. Remove or change any bandages (dressings) only as directed by your health care provider. You may have skin adhesive strips over the incision areas. Do not take the strips off. They will fall off on their own.  Check incision areas daily for any swelling, redness, or drainage.  If incisions were made in your legs, do the following:  Avoid crossing your legs.   Avoid sitting for long periods of time. Change positions every 30 minutes.   Elevate your legs when you are sitting.  Wear compression stockings as directed by your health care provider. These stockings help keep blood clots from forming in your legs.  Take showers once your health care provider approves. Until then, only take sponge baths. Pat incisions dry. Do not rub incisions with a washcloth or towel. Do not take baths, swim, or use a hot tub until your health care provider approves.  Eat foods that are high in fiber, such as raw fruits and vegetables, whole grains, beans, and nuts. Meats should be lean cut. Avoid canned, processed, and fried foods.  Drink enough fluid to keep your urine clear or pale yellow.  Weigh yourself every day. This helps identify if you are retaining fluid that may make your heart and lungs work harder.  Rest and limit activity as directed by your health care provider. You may be instructed to:  Stop any activity at once if you have chest pain, shortness of breath, irregular heartbeats, or dizziness. Get help right away if you have any of these symptoms.  Move around frequently for short periods or take short walks as directed by your health care provider. Increase your activities gradually. You may need physical therapy or cardiac rehabilitation to help  strengthen your muscles and build your endurance.  Avoid lifting, pushing, or pulling anything heavier than 10 lb (4.5 kg) for at least 6 weeks after surgery.  Do not drive until your health care provider approves.  Ask your health care provider when you may return to work.  Ask your health care provider when you may resume sexual activity.  Keep all follow-up visits as directed by your health care provider. This is important. SEEK MEDICAL CARE IF:  You have swelling, redness, increasing pain, or drainage at the site of an incision.  You have a fever.  You have swelling in your ankles or legs.  You have pain in your legs.   You gain 2 or more pounds (0.9 kg) a day.  You are nauseous or vomit.  You have diarrhea. SEEK IMMEDIATE MEDICAL CARE IF:  You have chest pain that goes to your jaw or arms.  You have shortness of breath.   You have a fast or irregular heartbeat.   You notice a "clicking" in your breastbone (sternum) when you move.   You have numbness or weakness in your arms or legs.  You feel dizzy or light-headed.  MAKE SURE YOU:  Understand these instructions.  Will watch your condition.  Will get help right away if you are not doing well or get worse. Document Released: 11/25/2004 Document Revised: 09/22/2013 Document Reviewed: 10/15/2012 [facility] Patient Information 2015 [place], [place]. This information is not intended to replace advice given to you by your health care provider. Make sure you discuss any questions you have with your health care provider.  Endoscopic Saphenous Vein Harvesting Care After Refer to this sheet in the next few weeks. These instructions provide you with information on caring for yourself after your procedure. Your health care provider may also give you more specific instructions. Your treatment has been planned according to current medical practices, but problems sometimes occur. Call your health care provider if you have  any problems or questions after your procedure. HOME CARE INSTRUCTIONS Medicine  Take whatever pain medicine your surgeon prescribes. Follow the directions carefully. Do not take over-the-counter pain medicine unless your surgeon says it is okay. Some pain medicine can cause bleeding problems for several weeks after surgery.  Follow your surgeon's instructions about driving. You will probably not be permitted to drive after heart surgery.  Take any medicines your surgeon prescribes. Any medicines you took before your heart surgery should be checked with your health care provider before you start taking them again. Wound care  If your surgeon has prescribed an elastic bandage or stocking, ask how long you should wear it.  Check the area around your surgical cuts (incisions) whenever your bandages (dressings) are changed. Look for any redness or swelling.  You will need to return to have the stitches (sutures) or staples taken out. Ask your surgeon when to do that.  Ask your surgeon when you can shower or bathe. Activity  Try to keep your legs raised when you are sitting.  Do any exercises your health care providers have given you. These may include deep breathing exercises, coughing, walking, or other exercises. SEEK MEDICAL CARE IF:  You have any questions about your medicines.  You have more leg pain, especially if your pain medicine stops working.  New or growing bruises develop on your leg.  Your leg swells, feels tight, or becomes red.  You have numbness in your leg. SEEK IMMEDIATE MEDICAL CARE IF:  Your pain gets much worse.  Blood or fluid leaks from any of the incisions.  Your incisions become warm, swollen, or red.  You have chest pain.  You have trouble breathing.  You have a fever.  You have more pain near your leg incision. MAKE SURE YOU:  Understand these instructions.  Will watch your condition.  Will get help right away if you are not doing well or  get worse. Document Released: 01/18/2011 Document Revised: 05/13/2013 Document Reviewed: 01/18/2011 [facility] Patient Information 2015 [place], [place]. This information is not intended to replace advice given to you by your health care provider. Make sure you discuss any questions you have with your health care provider.  Activity: 1.May walk up steps                2.No lifting more than ten pounds for four weeks.                 3.No driving for four weeks.                4.Stop any activity that causes chest pain, shortness of breath, dizziness, sweating or excessive weakness.  5.Avoid straining.                6.Continue with your breathing exercises daily.  Diet: Diabetic diet and Low fat, Low salt  diet  Wound Care: May shower.  Clean wounds with mild soap and water daily. Contact the office at [phone] if any problems arise.  Coronary Artery Bypass Grafting, Care After Refer to this sheet in the next few weeks. These instructions provide you with information on caring for yourself after your procedure. Your health care provider may also give you more specific instructions. Your treatment has been planned according to current medical practices, but problems sometimes occur. Call your health care provider if you have any problems or questions after your procedure. WHAT TO EXPECT AFTER THE PROCEDURE Recovery from surgery will be different for everyone. Some people feel well after 3 or 4 weeks, while for others it takes longer. After your procedure, it is typical to have the following:  Nausea and a lack of appetite.   Constipation.  Weakness and fatigue.   Depression or irritability.   Pain or discomfort at your incision site. HOME CARE INSTRUCTIONS  Take medicines only as directed by your health care provider. Do not stop taking medicines or start any new medicines without first checking with your health care provider.  Take your pulse as directed by your  health care provider.  Perform deep breathing as directed by your health care provider. If you were given a device called an incentive spirometer, use it to practice deep breathing several times a day. Support your chest with a pillow or your arms when you take deep breaths or cough.  Keep incision areas clean, dry, and protected. Remove or change any bandages (dressings) only as directed by your health care provider. You may have skin adhesive strips over the incision areas. Do not take the strips off. They will fall off on their own.  Check incision areas daily for any swelling, redness, or drainage.  If incisions were made in your legs, do the following:  Avoid crossing your legs.   Avoid sitting for long periods of time. Change positions every 30 minutes.   Elevate your legs when you are sitting.  Wear compression stockings as directed by your health care provider. These stockings help keep blood clots from forming in your legs.  Take showers once your health care provider approves. Until then, only take sponge baths. Pat incisions dry. Do not rub incisions with a washcloth or towel. Do not take baths, swim, or use a hot tub until your health care provider approves.  Eat foods that are high in fiber, such as raw fruits and vegetables, whole grains, beans, and nuts. Meats should be lean cut. Avoid canned, processed, and fried foods.  Drink enough fluid to keep your urine clear or pale yellow.  Weigh yourself every day. This helps identify if you are retaining fluid that may make your heart and lungs work harder.  Rest and limit activity as directed by your health care provider. You may be instructed to:  Stop any activity at once if you have chest pain, shortness of breath, irregular heartbeats, or dizziness. Get help right away if you have any of these symptoms.  Move around frequently for short periods or take short walks as directed by your health care provider. Increase your  activities gradually. You may need physical therapy or cardiac rehabilitation to help strengthen your muscles and build your endurance.  Avoid lifting, pushing, or pulling  anything heavier than 10 lb (4.5 kg) for at least 6 weeks after surgery.  Do not drive until your health care provider approves.  Ask your health care provider when you may return to work.  Ask your health care provider when you may resume sexual activity.  Keep all follow-up visits as directed by your health care provider. This is important. SEEK MEDICAL CARE IF:  You have swelling, redness, increasing pain, or drainage at the site of an incision.  You have a fever.  You have swelling in your ankles or legs.  You have pain in your legs.   You gain 2 or more pounds (0.9 kg) a day.  You are nauseous or vomit.  You have diarrhea. SEEK IMMEDIATE MEDICAL CARE IF:  You have chest pain that goes to your jaw or arms.  You have shortness of breath.   You have a fast or irregular heartbeat.   You notice a "clicking" in your breastbone (sternum) when you move.   You have numbness or weakness in your arms or legs.  You feel dizzy or light-headed.  MAKE SURE YOU:  Understand these instructions.  Will watch your condition.  Will get help right away if you are not doing well or get worse. Document Released: 11/25/2004 Document Revised: 09/22/2013 Document Reviewed: 10/15/2012 [facility] Patient Information 2015 [place], [place]. This information is not intended to replace advice given to you by your health care provider. Make sure you discuss any questions you have with your health care provider.

## 2015-01-07 ENCOUNTER — Inpatient Hospital Stay (HOSPITAL_COMMUNITY): Payer: Medicare Other

## 2015-01-07 DIAGNOSIS — M79661 Pain in right lower leg: Secondary | ICD-10-CM

## 2015-01-07 LAB — CBC
HEMATOCRIT: 27.8 % — AB (ref 39.0–52.0)
HEMOGLOBIN: 9.2 g/dL — AB (ref 13.0–17.0)
MCH: 30.1 pg (ref 26.0–34.0)
MCHC: 33.1 g/dL (ref 30.0–36.0)
MCV: 90.8 fL (ref 78.0–100.0)
Platelets: 341 10*3/uL (ref 150–400)
RBC: 3.06 MIL/uL — AB (ref 4.22–5.81)
RDW: 20.1 % — ABNORMAL HIGH (ref 11.5–15.5)
WBC: 11 10*3/uL — ABNORMAL HIGH (ref 4.0–10.5)

## 2015-01-07 LAB — BASIC METABOLIC PANEL
Anion gap: 5 (ref 5–15)
BUN: 28 mg/dL — ABNORMAL HIGH (ref 6–20)
CHLORIDE: 101 mmol/L (ref 101–111)
CO2: 26 mmol/L (ref 22–32)
CREATININE: 0.93 mg/dL (ref 0.61–1.24)
Calcium: 8 mg/dL — ABNORMAL LOW (ref 8.9–10.3)
GFR calc non Af Amer: >60 mL/min (ref >60)
Glucose, Bld: 98 mg/dL (ref 65–99)
POTASSIUM: 4.3 mmol/L (ref 3.5–5.1)
SODIUM: 132 mmol/L — AB (ref 135–145)

## 2015-01-07 MED ORDER — CEPHALEXIN 500 MG PO CAPS
500.0000 mg | ORAL_CAPSULE | Freq: Four times a day (QID) | ORAL | Status: DC
  Administered 2015-01-07 – 2015-01-11 (×17): 500 mg via ORAL
  Filled 2015-01-07 (×23): qty 1

## 2015-01-07 NOTE — Progress Notes (Addendum)
      Jacob Reyes       Jacob Reyes,Jacob Reyes             [phone]      10 Days Post-Op Procedure(s) (LRB): CORONARY ARTERY BYPASS GRAFTING (CABG), ON PUMP, TIMES SIX, USING LEFT INTERNAL MAMMARY ARTERY, RIGHT GREATER SAPHENOUS VEIN HARVESTED ENDOSCOPICALLY (N/A) TRANSESOPHAGEAL ECHOCARDIOGRAM (TEE) (N/A)   Subjective:  Jacob Reyes complains of RLE leg pain.  He states he would probably be able to ambulate without difficulty if his leg didn't hurt.    Objective: Vital signs in last 24 hours: Temp:  [97.4 F (36.3 C)-97.9 F (36.6 C)] 97.9 F (36.6 C) (08/18 0627) Pulse Rate:  [80-92] 92 (08/18 0627) Cardiac Rhythm:  [-] Normal sinus rhythm (08/17 2110) Resp:  [18-20] 20 (08/18 0627) BP: (92-119)/(70-76) 119/74 mmHg (08/18 0627) SpO2:  [97 %-99 %] 98 % (08/18 0627) Weight:  [214 lb 1.1 oz (97.1 kg)] 214 lb 1.1 oz (97.1 kg) (08/18 0547)  Intake/Output from previous day: 08/17 0701 - 08/18 0700 In: 750 [P.O.:720; I.V.:30] Out: 1025 [Urine:1025]  General appearance: alert, cooperative and no distress Heart: irregularly irregular rhythm Lungs: clear to auscultation bilaterally Abdomen: soft, non-tender; bowel sounds normal; no masses,  no organomegaly Extremities: edema 1+ Wound: RLE EVH site clean and dry, Right shin/calf red, ecchymotic + pain to palpation  Lab Results:  Recent Labs  01/05/15 0440 01/07/15 0450  WBC 9.8 11.0*  HGB 9.9* 9.2*  HCT 28.4* 27.8*  PLT 243 341   BMET:  Recent Labs  01/05/15 0440 01/07/15 0450  NA 131* 132*  K 3.8 4.3  CL 99* 101  CO2 25 26  GLUCOSE 106* 98  BUN 25* 28*  CREATININE 0.89 0.93  CALCIUM 7.9* 8.0*    PT/INR: No results for input(s): LABPROT, INR in the last 72 hours. ABG    Component Value Date/Time   PHART 7.418 12/29/2014 0328   HCO3 20.2 12/29/2014 0328   TCO2 20 12/29/2014 1618   ACIDBASEDEF 4.0* 12/29/2014 0328   O2SAT 93.0 12/29/2014 0328   CBG (last 3)   Recent Labs   01/05/15 1606 01/05/15 2120 01/06/15 0648  GLUCAP 107* 114* 101*    Assessment/Plan: S/P Procedure(s) (LRB): CORONARY ARTERY BYPASS GRAFTING (CABG), ON PUMP, TIMES SIX, USING LEFT INTERNAL MAMMARY ARTERY, RIGHT GREATER SAPHENOUS VEIN HARVESTED ENDOSCOPICALLY (N/A) TRANSESOPHAGEAL ECHOCARDIOGRAM (TEE) (N/A)  1. CV- Atrial Fibrillation, continue Amiodarone, Eliquis- continue to hold Beta Blocker due to hypotension 2. Pulm- no acute issues, 3. Renal- creatinine WNL, hypokalemia resolved, remains on Lasix for hypervolemia 4. RLE edema/erythema- cellulitis vs DVT- will order Keflex per order set protocol, get venous duplex patient already on anticoagulation 5. Deconditioning- continue PT/OT 6. Dispo- patient stable, on Eliquis for A. Fib, get venous duplex, start Keflex for possible RLE cellulitis   LOS: 10 days    BARRETT, ERIN 01/07/2015  On eliquis Right leg bruised and swollen no drainage, pre tibial area with erythremia keflex started   I have seen and examined Jacob Reyes and agree with the above assessment  and plan.  [name] MD Beeper [phone] Office [phone] 01/07/2015 8:04 AM

## 2015-01-07 NOTE — Progress Notes (Signed)
UR Completed. Roen Macgowan, RN, BSN.  [phone] 

## 2015-01-07 NOTE — Progress Notes (Signed)
While doing hourly rounding asked patient if he would like to walk as pt was c/o being uncomfortable in chair. Pt declined. Pt in chair with call bell in reach and son at bedside. Cato Mulligan RN

## 2015-01-07 NOTE — Progress Notes (Signed)
*  PRELIMINARY RESULTS* Vascular Ultrasound Lower extremity venous duplex has been completed.  Preliminary findings: No evidence of DVT bilaterally. Difficult evaluation of RLE due to edema. There appears to be superficial thrombosis at the proximal GSV (pre harvest site). Anechoic fluid with mixed echoes is noted in the distal thigh harvest site.    Landry Mellow, RDMS, RVT  01/07/2015, 12:13 PM

## 2015-01-07 NOTE — Progress Notes (Signed)
Subjective: only complains of rt leg pain.    Objective: Vital signs in last 24 hours: Temp:  [97.4 F (36.3 C)-97.9 F (36.6 C)] 97.9 F (36.6 C) (08/18 0627) Pulse Rate:  [80-92] 92 (08/18 0627) Resp:  [18-20] 20 (08/18 0627) BP: (92-119)/(70-76) 119/74 mmHg (08/18 0627) SpO2:  [97 %-99 %] 98 % (08/18 0627) Weight:  [214 lb 1.1 oz (97.1 kg)] 214 lb 1.1 oz (97.1 kg) (08/18 0547) Weight change: 7.5 oz (0.212 kg) Last BM Date: 01/05/15 Intake/Output from previous day:-275 08/17 0701 - 08/18 0700 In: 750 [P.O.:720; I.V.:30] Out: 1025 [Urine:1025] Intake/Output this shift: Total I/O In: 240 [P.O.:240] Out: -   PE: General:Pleasant affect, NAD Skin:Warm and dry, brisk capillary refill HEENT:normocephalic, sclera clear, mucus membranes moist Heart:S1S2 RRR without murmur, gallup, rub or click Lungs:clear without rales, rhonchi, or wheezes SJG:GEZM, non tender, + BS, do not palpate liver spleen or masses Ext:+ bil  lower ext edema though Rt > Lt and painful to touch, 2+ pedal pulses, 2+ radial pulses Neuro:alert and oriented X 3, MAE, follows commands, + facial symmetry Tele:  SR with PACs and PVCs.  Converted last pm     Lab Results:  Recent Labs  01/05/15 0440 01/07/15 0450  WBC 9.8 11.0*  HGB 9.9* 9.2*  HCT 28.4* 27.8*  PLT 243 341   BMET  Recent Labs  01/05/15 0440 01/07/15 0450  NA 131* 132*  K 3.8 4.3  CL 99* 101  CO2 25 26  GLUCOSE 106* 98  BUN 25* 28*  CREATININE 0.89 0.93  CALCIUM 7.9* 8.0*   No results for input(s): TROPONINI in the last 72 hours.  Invalid input(s): CK, MB  No results found for: CHOL, HDL, LDLCALC, LDLDIRECT, TRIG, CHOLHDL Lab Results  Component Value Date   HGBA1C 5.7* 12/25/2014     Lab Results  Component Value Date   TSH 3.385 12/30/2014       Studies/Results: No results found.  Medications: I have reviewed the patient's current medications. Scheduled Meds: . amiodarone  200 mg Oral Daily  .  apixaban  5 mg Oral Q12H  . aspirin EC  81 mg Oral Daily  . brimonidine  1 drop Both Eyes BID   And  . timolol  1 drop Both Eyes BID  . cephALEXin  500 mg Oral 4 times per day  . docusate sodium  200 mg Oral Daily  . finasteride  5 mg Oral QPC supper  . furosemide  20 mg Oral Daily  . magnesium chloride  2 tablet Oral Daily  . pantoprazole  40 mg Oral QAC breakfast  . polyethylene glycol  17 g Oral Daily  . potassium chloride  20 mEq Oral Daily  . pravastatin  40 mg Oral q1800  . sodium chloride  3 mL Intravenous Q12H  . tamsulosin  0.4 mg Oral BID   Continuous Infusions:  PRN Meds:.sodium chloride, albuterol, bisacodyl **OR** bisacodyl, lactulose, oxyCODONE, sodium chloride, sodium chloride, traMADol  Assessment/Plan: Active Problems:   S/P CABG x 6   Atrial fibrillation with RVR post op   Dyslipidemia   Hypotension post op   Anticoagulated-Eliquis added post CABG  Now in SR with PACs  CHADs VASc = 3 for age, vascular history. Eliquis added, Amiodarone taper ordered. Pt will follow up with Dr Bartholome Bill in [place] after discharge, please send Dr Ubaldo Glassing a copy of DC summary. We can follow peripherally.   Rt leg pain, going for  venous U/S  LOS: 10 days   Time spent with pt. : 15 minutes. [facility] R  Nurse Practitioner Certified Pager [phone] or after 5pm and on weekends call [phone] 01/07/2015, 10:25 AM   Tele reviewed.  Agree with above note. Back in NSR. COntinue Amio and taper as outpatient.  Continue Eliquis for at least a month.  F/u with Dr. Ubaldo Glassing.  Jettie Booze, MD

## 2015-01-07 NOTE — Discharge Summary (Signed)
Physician Discharge Summary  Patient ID: Jacob Reyes MRN: [id number] DOB/AGE: [birth year] 79 y.o.  Admit date: 12/28/2014 Discharge date: 01/11/2015  Admission Diagnoses:  Patient Active Problem List   Diagnosis Date Noted  . Anticoagulated-Eliquis added post CABG 01/06/2015  . Atrial fibrillation with RVR post op 01/02/2015  . Dyslipidemia 01/02/2015  . Hypotension post op 01/02/2015  . GERD (gastroesophageal reflux disease) 01/02/2015  . S/P CABG x 6 12/28/2014  . Coronary artery disease 12/10/2014  . Angina, class III 12/10/2014  . Combined fat and carbohydrate induced hyperlipemia 05/25/2014  . Cancer of colon- remote surgery 11/16/2013   Discharge Diagnoses:   Patient Active Problem List   Diagnosis Date Noted  . Dyslipidemia 01/02/2015  . Hypotension post op 01/02/2015  . GERD (gastroesophageal reflux disease) 01/02/2015  . Coronary artery disease 12/10/2014  . Angina, class III 12/10/2014  . Combined fat and carbohydrate induced hyperlipemia 05/25/2014  . Cancer of colon- remote surgery 11/16/2013   Discharged Condition: good  History of Present Illness:  Jacob Reyes is 81 y.o white male referred to [facility] for evaluation for CABG.  The patient is followed by Jacob Reyes who obtained a stress ECHO which was positive showing inferior and posterior ischemia.  Further work up with cardiac catheterization was performed and showed 3 vessel CAD.  The patient is active and exercises daily at the [facility]  He states that he has noticed episodes of chest pain on the right side of his chest.  He also states he has become for easily fatigued and his energy level has decreased.  He has previous cardiac history.  He was evaluated by Jacob Reyes on 12/24/2014 at which time he was in agreement the patient would benefit from CABG procedure.  The risks and benefits of the procedure were explained to the patient and he was agreeable to proceed.  Hospital Course:   Jacob Reyes presented to  [facility] on 12/28/2014.  He was taken to the operating room and underwent CABG x 6 utilizing LIMA to mid and distal LAD, SVG to Diagonal, Sequential SVG to OM1 and Distal Left Circumflex, and SVG to PDA.  He also underwent endoscopic harvest of the greater saphenous vein from the right leg.  His operation was complicated by coagulopathy.  His leg was re-explored for bleeding prior to leaving the OR.  He tolerated the procedure well and was taken to the SICU in stable condition.  The patient was extubated early morning after surgery.  He was transfused packed cells, cryo, and FFP for post operative coagulopathy.  He was hypotensive and required pressor support with Dopamine and Epinephrine as tolerated.  He developed post operative Atrial Fibrillation and was treated with IV Amiodarone.  He initially converted to NSR, but ultimately continued to have PAF.  POD #3 the patient suffered a fall.  He dehisced his RLE [facility] site.  This was clean and prepped in sterile fashion and some interrupted sutures were placed.  Once chest tube output decreased his chest tubes were removed without difficulty.  POD #7 the patient continued to have drainage from previous site in RLE where JP drain was placed.  The area was anesthesized and a suture was placed to help hemostasis.  The patient continued to make slow progress and was felt medically stable for transfer to the step down unit on POD #7 as well.    The patient continues to progress slowly.  He continues to have intermittent episodes of PAF.  He has  been started on Eliquis for stroke prophylaxis.  He remains hypotensive and we will continue to hold all beta blocker at this time.  The patients RLE remains ecchymotic, however it has not become erythematous.  He was started on Keflex for possible early cellulitis.  He also underwent venous duplex which was NEGATIVE for DVT but did show a superficial thrombosis in the proximal right GSV.  We will ask the SNF to please  remove the right thigh sutures on Monday 01/18/2015. He continues to be weak and deconditioned.  He is participating with PT/OT who have recommended SNF placement.  He is felt surgically stable for discharge to SNF today.  We ask the SNF to please do the following: 1. Please obtain vital signs at least one time daily 2.Please weigh the patient daily. If he or she continues to gain weight or develops lower extremity edema, contact the office at [phone]. 3. Ambulate patient at least three times daily and please use sternal precautions.       Consults: cardiology  Significant Diagnostic Studies: angiography:    LM lesion, 40% stenosed.  Ost Ramus lesion, 70% stenosed.  Prox Cx lesion, 80% stenosed.  Mid LAD lesion, 80% stenosed.  Prox RCA lesion, 100% stenosed.  1st Diag lesion, 70% stenosed.  Prox LAD lesion, 70% stenosed.  Treatments: surgery:   Coronary artery bypass grafting x6, with left internal mammary to the left anterior descending coronary artery in 2 sites, in the mid vessel and the distal LAD, reverse saphenous vein graft to the diagonal, sequential reverse saphenous vein graft to the first and second obtuse marginal, and reverse saphenous vein graft to the mid posterior descending coronary artery, with right leg greater saphenous vein harvesting from the thigh and calf.  Disposition: 01-Home or Self Care   Discharge medications:       Medication List    STOP taking these medications        CALCIUM-MAGNESIUM-VITAMIN D PO     Magnesium 300 MG Caps     metoprolol tartrate 25 MG tablet  Commonly known as:  LOPRESSOR     PARoxetine 10 MG tablet  Commonly known as:  PAXIL      TAKE these medications        amiodarone 200 MG tablet  Commonly known as:  PACERONE  Take 1 tablet (200 mg total) by mouth daily.     apixaban 5 MG Tabs tablet  Commonly known as:  ELIQUIS  Take 1 tablet (5 mg total) by mouth every 12 (twelve) hours.     aspirin EC 81 MG  tablet  Take 81 mg by mouth daily.     cephALEXin 500 MG capsule  Commonly known as:  KEFLEX  Take 1 capsule (500 mg total) by mouth every 6 (six) hours.     cholecalciferol 1000 UNITS tablet  Commonly known as:  VITAMIN D  Take 1,000 Units by mouth daily.     COMBIGAN 0.2-0.5 % ophthalmic solution  Generic drug:  brimonidine-timolol  Place 1 drop into both eyes 2 (two) times daily.     finasteride 5 MG tablet  Commonly known as:  PROSCAR  Take 5 mg by mouth daily after supper.     furosemide 20 MG tablet  Commonly known as:  LASIX  Take 1 tablet (20 mg total) by mouth daily.     lovastatin 40 MG tablet  Commonly known as:  MEVACOR  Take 40 mg by mouth daily after supper.  LUMIGAN 0.01 % Soln  Generic drug:  bimatoprost  Place 1 drop into both eyes at bedtime.     magnesium chloride 64 MG Tbec SR tablet  Commonly known as:  SLOW-MAG  Take 2 tablets (128 mg total) by mouth daily.     Melatonin 5 MG Tabs  Take 5 mg by mouth at bedtime as needed (sleep).     menthol-cetylpyridinium 3 MG lozenge  Commonly known as:  CEPACOL  Take 1 lozenge (3 mg total) by mouth as needed for sore throat.     omeprazole 20 MG capsule  Commonly known as:  PRILOSEC  Take 20 mg by mouth daily before breakfast.     polyethylene glycol powder powder  Commonly known as:  GLYCOLAX/MIRALAX  Take 17 g by mouth daily.     potassium chloride 10 MEQ tablet  Commonly known as:  K-DUR,KLOR-CON  Take 1 tablet (10 mEq total) by mouth daily.     tamsulosin 0.4 MG Caps capsule  Commonly known as:  FLOMAX  Take 0.4 mg by mouth 2 (two) times daily.     traMADol 50 MG tablet  Commonly known as:  ULTRAM  Take 1 tablet (50 mg total) by mouth every 4 (four) hours as needed for moderate pain.       The patient has been discharged on:   1.Beta Blocker:  Yes [   ]                              No   [ x  ]                              If No, reason: Labile Blood pressure  2.Ace Inhibitor/ARB:  Yes [   ]                                     No  [  x  ]                                     If No, reason: Labile blood pressure  3.Statin:   Yes [ x  ]                  No  [   ]                  If No, reason:  4.Ecasa:  Yes  [ x  ]                  No   [   ]                  If No, reason:      Medication List    STOP taking these medications        CALCIUM-MAGNESIUM-VITAMIN D PO     Magnesium 300 MG Caps     metoprolol tartrate 25 MG tablet  Commonly known as:  LOPRESSOR     PARoxetine 10 MG tablet  Commonly known as:  PAXIL      TAKE these medications        amiodarone 200 MG tablet  Commonly known as:  PACERONE  Take 1 tablet (  200 mg total) by mouth daily.     apixaban 5 MG Tabs tablet  Commonly known as:  ELIQUIS  Take 1 tablet (5 mg total) by mouth every 12 (twelve) hours.     aspirin EC 81 MG tablet  Take 81 mg by mouth daily.     cephALEXin 500 MG capsule  Commonly known as:  KEFLEX  Take 1 capsule (500 mg total) by mouth every 6 (six) hours.     cholecalciferol 1000 UNITS tablet  Commonly known as:  VITAMIN D  Take 1,000 Units by mouth daily.     COMBIGAN 0.2-0.5 % ophthalmic solution  Generic drug:  brimonidine-timolol  Place 1 drop into both eyes 2 (two) times daily.     finasteride 5 MG tablet  Commonly known as:  PROSCAR  Take 5 mg by mouth daily after supper.     furosemide 20 MG tablet  Commonly known as:  LASIX  Take 1 tablet (20 mg total) by mouth daily.     lovastatin 40 MG tablet  Commonly known as:  MEVACOR  Take 40 mg by mouth daily after supper.     LUMIGAN 0.01 % Soln  Generic drug:  bimatoprost  Place 1 drop into both eyes at bedtime.     magnesium chloride 64 MG Tbec SR tablet  Commonly known as:  SLOW-MAG  Take 2 tablets (128 mg total) by mouth daily.     Melatonin 5 MG Tabs  Take 5 mg by mouth at bedtime as needed (sleep).     menthol-cetylpyridinium 3 MG lozenge  Commonly known as:  CEPACOL  Take 1  lozenge (3 mg total) by mouth as needed for sore throat.     omeprazole 20 MG capsule  Commonly known as:  PRILOSEC  Take 20 mg by mouth daily before breakfast.     polyethylene glycol powder powder  Commonly known as:  GLYCOLAX/MIRALAX  Take 17 g by mouth daily.     potassium chloride 10 MEQ tablet  Commonly known as:  K-DUR,KLOR-CON  Take 1 tablet (10 mEq total) by mouth daily.     tamsulosin 0.4 MG Caps capsule  Commonly known as:  FLOMAX  Take 0.4 mg by mouth 2 (two) times daily.     traMADol 50 MG tablet  Commonly known as:  ULTRAM  Take 1 tablet (50 mg total) by mouth every 4 (four) hours as needed for moderate pain.       Follow-up Information    Follow up with Teodoro Spray., MD.   Specialty:  Cardiology   Why:  call office for follow up in one to two weeks   Contact information:   [place] [address] [phone]       Follow up with [name], MD On 02/11/2015.   Specialty:  Cardiothoracic Surgery   Why:  A/LAT CXR to be taken (at [facility] which is in the same building as Dr. Everrett Coombe office) on 02/11/2015 at 12:15 pm;Appointment time is at 1:00 pm   Contact information:   [place] [address] [phone]       Follow up with Rusty Aus., MD.   Specialty:  Internal Medicine   Why:  Call for a follow up appointment regarding further surveillance of HGA1C 5.7 (pre diabetes)   Contact information:   [place] [address] [phone]       Follow up with SNF.   Why:  Please remove right thigh sutures Monday 01/18/2015      Signed: Lars Pinks M PA-C 01/11/2015, 11:40 AM

## 2015-01-07 NOTE — Progress Notes (Signed)
CARDIAC REHAB PHASE I   PRE:  Rate/Rhythm: 106 ST c/ PACs  BP:  Sitting: 88/64        SaO2: 98 RA  MODE:  Ambulation: 270 ft   POST:  Rate/Rhythm: 125 ST  BP:  Sitting: 124/79         SaO2: 98 RA  Pt eager to ambulate. Pt RLE red and swollen, pt states it only hurts when he moves. Pt ambulated 270 ft on RA, rolling walker, gait belt, assist x2, followed with recliner, slow, fairly steady gait, tolerated well, increased distance today. Pt is still short of breath, reminded to take deep breaths, heart rate appears sinus tach. Pt did c/o DOE, RLE pain 7/10 with ambulation. Stopped in hallway to dress RLE due to bleeding at site (dressed with 4x4 gauze, abd pad, kerlix and ACE wrap to secure-no tape used), otherwise pt declined rest stop. RN aware, present to assist with dressing application. Pt very positive and very appreciative of walk. Pt to recliner after walk, feet elevated, call bell within reach. Encouraged ambulation, IS. Pt verbalized understanding, can be assist x1.    [id number]  Lenna Sciara, RN, BSN 01/07/2015 8:52 AM

## 2015-01-08 NOTE — Progress Notes (Addendum)
Patient Name: Jacob Reyes Date of Encounter: 01/08/2015   SUBJECTIVE  Denies chest pain. Only complain of R leg pain.   CURRENT MEDS . amiodarone  200 mg Oral Daily  . apixaban  5 mg Oral Q12H  . aspirin EC  81 mg Oral Daily  . brimonidine  1 drop Both Eyes BID   And  . timolol  1 drop Both Eyes BID  . cephALEXin  500 mg Oral 4 times per day  . docusate sodium  200 mg Oral Daily  . finasteride  5 mg Oral QPC supper  . furosemide  20 mg Oral Daily  . magnesium chloride  2 tablet Oral Daily  . pantoprazole  40 mg Oral QAC breakfast  . polyethylene glycol  17 g Oral Daily  . potassium chloride  20 mEq Oral Daily  . pravastatin  40 mg Oral q1800  . sodium chloride  3 mL Intravenous Q12H  . tamsulosin  0.4 mg Oral BID    OBJECTIVE  Filed Vitals:   01/07/15 0627 01/07/15 1418 01/07/15 1925 01/08/15 0431  BP: 119/74 103/50 124/76 135/86  Pulse: 92 93 96 61  Temp: 97.9 F (36.6 C) 97.7 F (36.5 C) 98.2 F (36.8 C) 97.7 F (36.5 C)  TempSrc: Oral Oral Oral Oral  Resp: 20 18 18 18   Height:      Weight:    217 lb 4.8 oz (98.567 kg)  SpO2: 98% 97% 98% 97%    Intake/Output Summary (Last 24 hours) at 01/08/15 1205 Last data filed at 01/08/15 0900  Gross per 24 hour  Intake    960 ml  Output    950 ml  Net     10 ml   Filed Weights   01/06/15 0509 01/07/15 0547 01/08/15 0431  Weight: 213 lb 9.6 oz (96.888 kg) 214 lb 1.1 oz (97.1 kg) 217 lb 4.8 oz (98.567 kg)    PHYSICAL EXAM  General: Pleasant, NAD. Neuro: Alert and oriented X 3. Moves all extremities spontaneously. Psych: Normal affect. HEENT:  Normal  Neck: Supple without bruits or JVD. Lungs:  Resp regular and unlabored, CTA. Heart: RRR no s3, s4, or murmurs. Abdomen: Soft, non-tender, non-distended, BS + x 4.  Extremities: No clubbing, cyanosis. DP/PT/Radials 2+ and equal bilaterally. Bilateral Le edema R> L. erythema to RLE at harvest site.   Accessory Clinical Findings  CBC  Recent Labs  01/07/15 0450  WBC 11.0*  HGB 9.2*  HCT 27.8*  MCV 90.8  PLT 630   Basic Metabolic Panel  Recent Labs  01/07/15 0450  NA 132*  K 4.3  CL 101  CO2 26  GLUCOSE 98  BUN 28*  CREATININE 0.93  CALCIUM 8.0*    TELE  SR with PACs and PVCs.   Radiology/Studies  Dg Chest 2 View  12/25/2014   CLINICAL DATA:  Preoperative exam prior to surgery, history of coronary artery disease and colonic malignancy  EXAM: CHEST  2 VIEW  COMPARISON:  PA and lateral chest x-ray of November 03, 2007  FINDINGS: The lungs are adequately inflated. There is no focal infiltrate. There is no pleural effusion. The interstitial markings are coarse but stable. The heart and pulmonary vascularity are normal. There is tortuosity of the descending thoracic aorta. There is moderate dextrocurvature of the mid thoracic spine with multilevel degenerative disc disease.  IMPRESSION: There is no acute cardiopulmonary abnormality. There are chronic bronchitic changes.   Electronically Signed   By: David  Martinique M.D.  On: 12/25/2014 13:28   Dg Chest Port 1 View  01/04/2015   CLINICAL DATA:  Status post CABG 12/28/2014.  EXAM: PORTABLE CHEST - 1 VIEW  COMPARISON:  Single view of the chest 01/02/2015 and 01/01/2015.  FINDINGS: Right PICC remains in place. Small bilateral pleural effusions and basilar atelectasis have improved. Heart size is upper normal. No pulmonary edema. No pneumothorax.  IMPRESSION: Some improvement in small bilateral pleural effusions and basilar atelectasis. No new abnormality.   Electronically Signed   By: Inge Rise M.D.   On: 01/04/2015 07:40   Dg Chest Port 1 View  01/02/2015   CLINICAL DATA:  Shortness of breath status post coronary artery bypass graft x6.  EXAM: PORTABLE CHEST - 1 VIEW  COMPARISON:  January 01, 2015.  FINDINGS: Stable cardiomediastinal silhouette. Status post coronary artery bypass graft. No pneumothorax is noted. Right-sided PICC line is again noted with distal tip in the expected  position of cavoatrial junction. Mild bibasilar opacities are noted most consistent with subsegmental atelectasis with associated pleural effusions. Bony thorax is intact.  IMPRESSION: Stable mild bibasilar opacities consistent with subsegmental atelectasis with associated pleural effusions.   Electronically Signed   By: Marijo Conception, M.D.   On: 01/02/2015 09:54   Dg Chest Port 1 View  01/01/2015   CLINICAL DATA:  PICC line placement  EXAM: PORTABLE CHEST - 1 VIEW  COMPARISON:  01/01/2015 at 0626 hours  FINDINGS: Right arm PICC terminates at the cavoatrial junction.  Small bilateral pleural effusions. No frank interstitial edema. No pneumothorax.  Right IJ venous sheath terminates in the upper SVC.  Heart is top-normal in size. Postsurgical changes related to prior CABG.  IMPRESSION: Right arm PICC terminates at the cavoatrial junction.  Small bilateral pleural effusions.  No pneumothorax.   Electronically Signed   By: Julian Hy M.D.   On: 01/01/2015 17:58   Dg Chest Port 1 View  01/01/2015   CLINICAL DATA:  Status post CABG on December 28, 2014  EXAM: PORTABLE CHEST - 1 VIEW  COMPARISON:  Portable chest x-ray of December 31, 2014  FINDINGS: The lungs are less well inflated today. There is mild bibasilar subsegmental atelectasis. There is no significant pleural effusion. The cardiac silhouette is enlarged. The pulmonary vascularity is mildly prominent centrally. There is 7 intact sternal wires. The right internal jugular Cordis sheath is stable.  IMPRESSION: Decreased aeration of both lungs which is due to hypoinflation and accentuated by changes in positioning. There remains mild pulmonary interstitial edema and mild bibasilar atelectasis.   Electronically Signed   By: David  Martinique M.D.   On: 01/01/2015 07:51   Dg Chest Port 1 View  12/31/2014   CLINICAL DATA:  Coronary artery disease.  Status post CABG.  EXAM: PORTABLE CHEST - 1 VIEW  COMPARISON:  12/30/2014, 12/29/2014, 12/28/2014 and 12/25/2014   FINDINGS: Heart size and pulmonary vascularity are normal. No pneumothorax. Left chest tube and central venous sheath are unchanged.  Resolution of atelectasis the left base. Improved slight atelectasis and small effusion at the right base.  IMPRESSION: No pneumothorax. Left lung is clear. Improved atelectasis and small effusion at the right base.   Electronically Signed   By: Lorriane Shire M.D.   On: 12/31/2014 08:00   Dg Chest Port 1 View  12/30/2014   CLINICAL DATA:  Status post CABG on December 28, 2014  EXAM: PORTABLE CHEST - 1 VIEW  COMPARISON:  Portable chest x-ray of December 29, 2014  FINDINGS: The lungs  are reasonably well inflated. There is persistent bibasilar atelectasis and small pleural effusion. The cardiac silhouette remains mildly enlarged. The pulmonary vascularity is slightly less conspicuous today. The Swan-Ganz catheter is been removed. The 2 left-sided chest tubes are unchanged in position. The right internal jugular Cordis sheath tip projects over the proximal SVC.  IMPRESSION: Allowing for differences in inflation of the lungs there has not been significant interval change since yesterday's study. There is persistent bibasilar atelectasis with small pleural effusions. The pulmonary vascularity is slightly less engorged. The remaining support tubes are in reasonable position.   Electronically Signed   By: David  Martinique M.D.   On: 12/30/2014 07:46   Dg Chest Port 1 View  12/29/2014   CLINICAL DATA:  81 year old male status post CABG. Initial encounter.  EXAM: PORTABLE CHEST - 1 VIEW  COMPARISON:  12/22/1978 1,016 and earlier.  FINDINGS: Portable AP semi upright view at 0645 hours. Extubated and enteric tube removed. Stable right IJ Swan-Ganz catheter. Both left chest tubes remain in place. Mildly improved lung volumes. Left greater than right small volume pleural effusions. No pneumothorax or pulmonary edema. Stable cardiac size and mediastinal contours. Sequelae of CABG.  IMPRESSION: 1.  Extubated and enteric tube removed. Mildly improved lung volumes. 2.  Otherwise, stable lines and tubes.  No pneumothorax. 3. Small left greater than right pleural effusions.   Electronically Signed   By: Genevie Ann M.D.   On: 12/29/2014 07:56   Dg Chest Port 1 View  12/28/2014   CLINICAL DATA:  81 year old male status post CABG.  EXAM: PORTABLE CHEST - 1 VIEW  COMPARISON:  Chest x-ray 12/25/2014.  FINDINGS: An endotracheal tube is in place with tip 6.9 cm above the carina. A nasogastric tube is seen extending into the stomach, however, the tip of the nasogastric tube extends below the lower margin of the image. Right IJ Cordis through which a Swan-Ganz catheter has been passed into the distal pulmonic trunk. Left-sided chest tube with tip in position in the upper left hemithorax. Additional drainage catheter slightly to the left of midline may represent an additional left-sided chest tube or a pericardial drain. Epicardial pacing wires are noted. Lung volumes are low. Bibasilar opacities presumably represent some postoperative atelectasis with trace bilateral pleural effusions. No pneumothorax. Crowding of the pulmonary vasculature related to low lung volumes, but no frank pulmonary edema. Heart size is normal. Mediastinal contours are distorted by patient's rotation to the left. Status post median sternotomy for CABG.  IMPRESSION: 1. Postoperative changes and support apparatus, as above. 2. Low lung volumes with mild bibasilar postoperative atelectasis, and probable trace bilateral pleural effusions.   Electronically Signed   By: Vinnie Langton M.D.   On: 12/28/2014 17:28    ASSESSMENT AND PLAN   Active Problems:  S/P CABG x 6  Atrial fibrillation with RVR post op  Dyslipidemia  Hypotension post op  Anticoagulated-Eliquis added post CABG  Plan: Maintaining sinus rhythm with PACs and PVCs. Continue Eliquis for at least once month and taper amio as outpatient.  Pt will follow up with Dr Bartholome Bill in [place] after discharge, please send Dr Ubaldo Glassing a copy of DC summary. We can follow peripherally.  - LE doppler showed no DVT. Superificial thrombosis in the proximal right GSV, pre harvest site. The distal thigh harvest site demonstrates mixed echoes. - Continue Keflex. Site marked as baseline.    Signed, Bhagat,Bhavinkumar PA-C Pager [phone]  I have examined the patient and reviewed assessment and plan and  discussed with patient.  Agree with above as stated.  Amio adn Eliquis will likely not be long term meds. F/u with Dr. Ubaldo Glassing.  Right leg erythema  WIll sign off.  Elimelech Houseman S.

## 2015-01-08 NOTE — Progress Notes (Addendum)
      [address]       ,[place] [address]             [phone]      11 Days Post-Op Procedure(s) (LRB): CORONARY ARTERY BYPASS GRAFTING (CABG), ON PUMP, TIMES SIX, USING LEFT INTERNAL MAMMARY ARTERY, RIGHT GREATER SAPHENOUS VEIN HARVESTED ENDOSCOPICALLY (N/A) TRANSESOPHAGEAL ECHOCARDIOGRAM (TEE) (N/A)   Subjective:  Jacob Reyes has no new complaints.  Continues to complain of Right leg pain  Objective: Vital signs in last 24 hours: Temp:  [97.7 F (36.5 C)-98.2 F (36.8 C)] 98.1 F (36.7 C) (08/19 1327) Pulse Rate:  [61-96] 90 (08/19 1327) Cardiac Rhythm:  [-] Other (Comment) (08/19 0813) Resp:  [18] 18 (08/19 1327) BP: (103-136)/(50-86) 136/86 mmHg (08/19 1327) SpO2:  [96 %-98 %] 96 % (08/19 1327) Weight:  [217 lb 4.8 oz (98.567 kg)] 217 lb 4.8 oz (98.567 kg) (08/19 0431)  Intake/Output from previous day: 08/18 0701 - 08/19 0700 In: 720 [P.O.:720] Out: 950 [Urine:950] Intake/Output this shift: Total I/O In: 720 [P.O.:720] Out: -   General appearance: alert, cooperative and no distress Heart: regular rate and rhythm Lungs: clear to auscultation bilaterally Abdomen: soft, non-tender; bowel sounds normal; no masses,  no organomegaly Extremities: edema RLE > LLE Wound: clean, continues to have oozing from LE [facility] site  Lab Results:  Recent Labs  01/07/15 0450  WBC 11.0*  HGB 9.2*  HCT 27.8*  PLT 341   BMET:  Recent Labs  01/07/15 0450  NA 132*  K 4.3  CL 101  CO2 26  GLUCOSE 98  BUN 28*  CREATININE 0.93  CALCIUM 8.0*    PT/INR: No results for input(s): LABPROT, INR in the last 72 hours. ABG    Component Value Date/Time   PHART 7.418 12/29/2014 0328   HCO3 20.2 12/29/2014 0328   TCO2 20 12/29/2014 1618   ACIDBASEDEF 4.0* 12/29/2014 0328   O2SAT 93.0 12/29/2014 0328   CBG (last 3)   Recent Labs  01/05/15 1606 01/05/15 2120 01/06/15 0648  GLUCAP 107* 114* 101*    Assessment/Plan: S/P Procedure(s) (LRB): CORONARY  ARTERY BYPASS GRAFTING (CABG), ON PUMP, TIMES SIX, USING LEFT INTERNAL MAMMARY ARTERY, RIGHT GREATER SAPHENOUS VEIN HARVESTED ENDOSCOPICALLY (N/A) TRANSESOPHAGEAL ECHOCARDIOGRAM (TEE) (N/A)  1. CV- Previous A.Fib, NSR this morning- continue Amiodarone, Eliquis- no beta blocker due to hypotension 2. Pulm- no acute issues 3. Renal- remains hypervolemic, continue Lasix 4. RLE Cellulitis- venous duplex negative for DVT, on Keflex- however likely just ecchymosis from [facility] procedure with post operative coagulopathy 5. Deconditioning- PT/OT, SNF at discharge 6. Dispo- patient stable, RLE cellulitis vs. Ecchymosis with residual clot, maintaining NSR continue Eliquis, for SNF Monday if remains stable   LOS: 11 days    Reyes, Jacob 01/08/2015  Came to see patient , attempted to walk earlier but became very light headed and could not walk Patient continues to cooperate with rehab attempts but remains very week. Will need extensive rehab  Sinus with pvc today, no a fib Poss snf Monday I have seen and examined Doren Custard and agree with the above assessment  and plan.  [name] MD Beeper [phone] Office [phone] 01/08/2015 5:05 PM

## 2015-01-08 NOTE — Progress Notes (Signed)
CARDIAC REHAB PHASE I   PRE:  Rate/Rhythm: 92 SR c/ PACs  BP:  Sitting: 110/61        SaO2: 97 RA  MODE:  Ambulation: 150 ft   POST:  Rate/Rhythm: 115 ST c/ PVCs  BP:  Sitting: 117/98         SaO2: 100 RA  Pt in bed, discouraged that he had difficulty standing from recliner before lunch when working with OT. Pt states he is "willing to try again."  Pt very aware of sternal precautions, stood with minimal assistance, lots of encouragement for confidence. Pt ambulated 150 ft on RA, rolling walker, gait belt, assist x1, slow, steady gait, tolerated well. Pt did c/o DOE, denies CP, dizziness, standing rest x2. Pt enjoys taking while walking, needs to be reminded to focus on deep breathing. Pt very encouraged after walk and very appreciative. Pt to recliner after walk, feet elevated, call bell within reach. Will follow.    [id number] Lenna Sciara, RN, BSN 01/08/2015 2:27 PM

## 2015-01-08 NOTE — Progress Notes (Signed)
Occupational Therapy Treatment Patient Details Name: Jacob Reyes MRN: [id number] DOB: 1925/07/31 Today's Date: 01/08/2015    History of present illness Admitted 8/8 and underwent CABG x 6; post-op afib and hypotension requiring pressors; 8/11 AM found on floor by nsg  PMHx- colon Ca; bil HOH; HTN; anxiety   OT comments  Patient progressing (slowly) towards goals. Pt limited by increased anxiety and frustration when unable to stand first attempt. Tried standing X5 with one person and patient barely able to clear bottom from recliner. When +2 assist arrived, pt stood with mod assist (+2) first attempt. During each initial attempt, pt shaking head and stating "I can't do this". Therapist educated patient on specific technique for sit<>stands to help increase independence. Believe fatigue was also a limiting factor as patient has been up most of the day today. Pt more than willing to work with therapist and is very motivated to gain back his independence. Pt will benefit from [facility].    Follow Up Recommendations  SNF;Supervision/Assistance - 24 hour    Equipment Recommendations  Other (comment) (TBD next venue of care)    Recommendations for Other Services  None at this time  Precautions / Restrictions Precautions Precautions: Sternal;Fall Precaution Comments: pt able to state 1/3 precautions, needed cues to adhere Restrictions Weight Bearing Restrictions: No    Mobility Bed Mobility Overal bed mobility: Needs Assistance Bed Mobility: Sit to Supine       Sit to supine: Mod assist   General bed mobility comments: Mod assist for management of BLEs.   Transfers Overall transfer level: Needs assistance Equipment used: Rolling walker (2 wheeled) Transfers: Sit to/from Stand Sit to Stand: Mod assist;+2 physical assistance;+2 safety/equipment General transfer comment: Cues to adhere to sternal precautions. Mod assist to help lift/lower. Cues for technique and sequencing.      Balance Overall balance assessment: Needs assistance Sitting-balance support: No upper extremity supported;Feet supported Sitting balance-Leahy Scale: Fair     Standing balance support: Bilateral upper extremity supported;During functional activity Standing balance-Leahy Scale: Poor Standing balance comment: able to stand without UE support, however chooses wide BOS with unsteadiness; modified tandem stance Rt and Lt with min assist due to unsteady; overhead reaching with unilateral UE support minguard    ADL Overall ADL's : Needs assistance/impaired General ADL Comments: Pt mod assist +2 for sit<>stands (pt unable to stand from recliner with 1 person). Pt required cueing to adhere to sternal precautions during functional transfers and activities. Pt only able to state 1/3 sternal precaution.      Cognition   Behavior During Therapy: WFL for tasks assessed/performed Overall Cognitive Status: Within Functional Limits for tasks assessed [facility])                 Pertinent Vitals/ Pain       Pain Assessment: Faces Pain Score: 8  Faces Pain Scale: Hurts a little bit Pain Location: RLE Pain Descriptors / Indicators: Discomfort Pain Intervention(s): Monitored during session;Repositioned  Home Living Family/patient expects to be discharged to:: Skilled nursing facility        Frequency Min 2X/week     Progress Toward Goals  OT Goals(current goals can now be found in the care plan section)  Progress towards OT goals: Progressing toward goals  Acute Rehab OT Goals Patient Stated Goal: regain his strength   Plan Discharge plan remains appropriate    End of Session Equipment Utilized During Treatment: Gait belt;Rolling walker   Activity Tolerance Other (comment) (limited by anxiety)  Patient Left in bed;with call bell/phone within reach;with family/visitor present;with nursing/sitter in room    Time: 1251-1323 OT Time Calculation (min): 32 min  Charges: OT General  Charges $OT Visit: 1 Procedure OT Treatments $Therapeutic Activity: 23-37 mins  Meeyah Ovitt , MS, OTR/L, CLT Pager: [phone]  01/08/2015, 1:34 PM

## 2015-01-08 NOTE — Progress Notes (Signed)
Physical Therapy Treatment Patient Details Name: Jacob Reyes MRN: [id number] DOB: 10/14/1925 Today's Date: 01/08/2015    History of Present Illness Admitted 8/8 and underwent CABG x 6; post-op afib and hypotension requiring pressors; 8/11 AM found on floor by nsg  PMHx- colon Ca; bil HOH; HTN; anxiety    PT Comments    Patient slightly discouraged by slow progress. Emphasized areas where he has shown great progress this week (sit to stand, ambulation tolerance, balance).   Follow Up Recommendations  Supervision/Assistance - 24 hour;SNF     Equipment Recommendations  Rolling walker with 5" wheels    Recommendations for Other Services       Precautions / Restrictions Precautions Precautions: Sternal;Fall Precaution Comments: pt able to state precautions; adhered without cues Restrictions Weight Bearing Restrictions: Yes    Mobility  Bed Mobility               General bed mobility comments: sitting in recliner chair  Transfers Overall transfer level: Needs assistance Equipment used: Rolling walker (2 wheeled) Transfers: Sit to/from Stand Sit to Stand: Mod assist;Min assist         General transfer comment: cues for technique. Used rocking technique; Progressed from mod assist to min assist; x5 repa  Ambulation/Gait Ambulation/Gait assistance: Min guard Ambulation Distance (Feet): 150 Feet Assistive device: Rolling walker (2 wheeled) Gait Pattern/deviations: Step-through pattern;Decreased stride length;Trunk flexed;Wide base of support   Gait velocity interpretation: Below normal speed for age/gender General Gait Details: Patient mildly unsteady. Cues for safe positioning of RW. cues to slow breathing   Stairs            Wheelchair Mobility    Modified Rankin (Stroke Patients Only)       Balance           Standing balance support: No upper extremity supported Standing balance-Leahy Scale: Poor Standing balance comment: able to stand  without UE support, however chooses wide BOS with unsteadiness; modified tandem stance Rt and Lt with min assist due to unsteady; overhead reaching with unilateral UE support minguard                     Cognition Arousal/Alertness: Awake/alert Behavior During Therapy: WFL for tasks assessed/performed Overall Cognitive Status: Within Functional Limits for tasks assessed                      Exercises General Exercises - Lower Extremity Ankle Circles/Pumps: AROM;Both;15 reps Toe Raises: AROM;Both;20 reps;Seated Heel Raises: AROM;Both;20 reps;Seated Mini-Sqauts: AAROM;Both;10 reps;Standing (no UE support)    General Comments        Pertinent Vitals/Pain Pain Assessment: 0-10 Pain Score: 8  Pain Location: RLE Pain Descriptors / Indicators: Constant Pain Intervention(s): Limited activity within patient's tolerance;Monitored during session;Patient requesting pain meds-RN notified;RN gave pain meds during session;Repositioned    Home Living                      Prior Function            PT Goals (current goals can now be found in the care plan section) Acute Rehab PT Goals Patient Stated Goal: regain his strength  PT Goal Formulation: With patient Time For Goal Achievement: 01/22/15 Potential to Achieve Goals: Good Progress towards PT goals: Progressing toward goals (goals updated (timeframe expired))    Frequency  Min 3X/week    PT Plan Current plan remains appropriate    Co-evaluation  End of Session Equipment Utilized During Treatment: Gait belt Activity Tolerance: Patient tolerated treatment well Patient left: in chair;with call bell/phone within reach;with family/visitor present     Time: 4035-2481 PT Time Calculation (min) (ACUTE ONLY): 30 min  Charges:  $Gait Training: 8-22 mins $Therapeutic Exercise: 8-22 mins                    G Codes:      Shann Lewellyn 2015/01/22, 9:45 AM Pager [phone]

## 2015-01-08 NOTE — Care Management Important Message (Signed)
Important Message  Patient Details  Name: Jacob Reyes MRN: [id number] Date of Birth: 20-Mar-1926   Medicare Important Message Given:  Yes-third notification given    Maryclare Labrador, RN 01/08/2015, 11:47 AM

## 2015-01-08 NOTE — Progress Notes (Signed)
CSW updated [facility]- they are able to accept when pt is medically stable  CSW will continue to follow.   Domenica Reamer, [place] Social Worker [phone]

## 2015-01-09 MED ORDER — SODIUM CHLORIDE 0.9 % IJ SOLN
10.0000 mL | INTRAMUSCULAR | Status: DC | PRN
  Administered 2015-01-10: 30 mL
  Filled 2015-01-09: qty 40

## 2015-01-09 MED ORDER — FLEET ENEMA 7-19 GM/118ML RE ENEM: 1.0000 | ENEMA | Freq: Every day | RECTAL | Status: DC | PRN

## 2015-01-09 NOTE — Progress Notes (Signed)
CARDIAC REHAB PHASE I   PRE:  Rate/Rhythm: 96 SR    BP: sitting 114/76    SaO2: 97 RA  MODE:  Ambulation: 300 ft   POST:  Rate/Rhythm: 109 ST with PVCs    BP: sitting 136/88    SaO2: 95 RA  Pt needed assist to stand with gait belt due to weak knees. Used RW to walk, rest x1 due to SOB. C/o right leg pain. Return to recliner. Will continue to follow. Doing well. 2010-0712   Josephina Shih Bath CES, ACSM 01/09/2015 3:01 PM

## 2015-01-09 NOTE — Progress Notes (Addendum)
       [address]       [place] [address]             [phone]          12 Days Post-Op Procedure(s) (LRB): CORONARY ARTERY BYPASS GRAFTING (CABG), ON PUMP, TIMES SIX, USING LEFT INTERNAL MAMMARY ARTERY, RIGHT GREATER SAPHENOUS VEIN HARVESTED ENDOSCOPICALLY (N/A) TRANSESOPHAGEAL ECHOCARDIOGRAM (TEE) (N/A)  Subjective: C/o constipation, otherwise feels well. Lightheadedness much improved and wants to walk.   Objective: Vital signs in last 24 hours: Patient Vitals for the past 24 hrs:  BP Temp Temp src Pulse Resp SpO2 Weight  01/09/15 0604 (!) 135/95 mmHg 97.3 F (36.3 C) Oral 93 18 98 % 207 lb 11.2 oz (94.212 kg)  01/08/15 1944 115/63 mmHg 98.6 F (37 C) Oral (!) 104 17 98 % -  01/08/15 1327 136/86 mmHg 98.1 F (36.7 C) Oral 90 18 96 % -   Current Weight  01/09/15 207 lb 11.2 oz (94.212 kg)     Intake/Output from previous day: 08/19 0701 - 08/20 0700 In: 720 [P.O.:720] Out: 1325 [Urine:1325]    PHYSICAL EXAM:  Heart: RRR with occ PVCs Lungs: Clear Wound: Sternal and leg wounds clean and dry. RLE lower leg "stab" site separated but not draining at present. Extremities: RLE edema and resolving erythema in calf.    Lab Results: CBC: Recent Labs  01/07/15 0450  WBC 11.0*  HGB 9.2*  HCT 27.8*  PLT 341   BMET:  Recent Labs  01/07/15 0450  NA 132*  K 4.3  CL 101  CO2 26  GLUCOSE 98  BUN 28*  CREATININE 0.93  CALCIUM 8.0*    PT/INR: No results for input(s): LABPROT, INR in the last 72 hours.    Assessment/Plan: S/P Procedure(s) (LRB): CORONARY ARTERY BYPASS GRAFTING (CABG), ON PUMP, TIMES SIX, USING LEFT INTERNAL MAMMARY ARTERY, RIGHT GREATER SAPHENOUS VEIN HARVESTED ENDOSCOPICALLY (N/A) TRANSESOPHAGEAL ECHOCARDIOGRAM (TEE) (N/A)  CV- Maintaining sinus rhythm with frequent PVCs. BPs improved. Continue po Amio, Eliquis/ASA.  Expected postop blood loss anemia- H/H stable.  Vol overload- continue gentle  diuresis.  GI- constipation, will Rx LOC today.  RLE cellulitis- improved, continue po Keflex.  Deconditioning- PT/CRPI.  Hopefully to SNF next week.   LOS: 12 days    Haydel,GINA H 01/09/2015  patient examined and medical record reviewed,agree with above note. Tharon Aquas Trigt III 01/09/2015

## 2015-01-09 NOTE — Progress Notes (Signed)
Pt. Refused to walk this am but walk 362ft. This afternoon tolarated well

## 2015-01-10 LAB — CBC
HCT: 29.5 % — ABNORMAL LOW (ref 39.0–52.0)
Hemoglobin: 9.7 g/dL — ABNORMAL LOW (ref 13.0–17.0)
MCH: 30.2 pg (ref 26.0–34.0)
MCHC: 32.9 g/dL (ref 30.0–36.0)
MCV: 91.9 fL (ref 78.0–100.0)
Platelets: 398 10*3/uL (ref 150–400)
RBC: 3.21 MIL/uL — ABNORMAL LOW (ref 4.22–5.81)
RDW: 20.1 % — ABNORMAL HIGH (ref 11.5–15.5)
WBC: 8.4 10*3/uL (ref 4.0–10.5)

## 2015-01-10 LAB — BASIC METABOLIC PANEL
Anion gap: 6 (ref 5–15)
BUN: 16 mg/dL (ref 6–20)
CO2: 27 mmol/L (ref 22–32)
Calcium: 8.1 mg/dL — ABNORMAL LOW (ref 8.9–10.3)
Chloride: 101 mmol/L (ref 101–111)
Creatinine, Ser: 0.88 mg/dL (ref 0.61–1.24)
GFR calc Af Amer: >60 mL/min (ref >60)
GFR calc non Af Amer: >60 mL/min (ref >60)
Glucose, Bld: 89 mg/dL (ref 65–99)
Potassium: 4.4 mmol/L (ref 3.5–5.1)
Sodium: 134 mmol/L — ABNORMAL LOW (ref 135–145)

## 2015-01-10 MED ORDER — MENTHOL 3 MG MT LOZG
1.0000 | LOZENGE | OROMUCOSAL | Status: DC | PRN
  Administered 2015-01-10: 3 mg via ORAL
  Filled 2015-01-10: qty 9

## 2015-01-10 NOTE — Progress Notes (Addendum)
       [address]       [place] [address]             [phone]          13 Days Post-Op Procedure(s) (LRB): CORONARY ARTERY BYPASS GRAFTING (CABG), ON PUMP, TIMES SIX, USING LEFT INTERNAL MAMMARY ARTERY, RIGHT GREATER SAPHENOUS VEIN HARVESTED ENDOSCOPICALLY (N/A) TRANSESOPHAGEAL ECHOCARDIOGRAM (TEE) (N/A)  Subjective: Had a large BM yesterday and feeling much better overall.  Still quite weak in legs when getting OOB.   Objective: Vital signs in last 24 hours: Patient Vitals for the past 24 hrs:  BP Temp Temp src Pulse Resp SpO2 Weight  01/10/15 0440 124/76 mmHg 98.2 F (36.8 C) Oral 84 18 98 % 216 lb 7.9 oz (98.2 kg)  01/09/15 1953 131/73 mmHg 98.4 F (36.9 C) Oral 96 18 100 % -  01/09/15 1510 104/68 mmHg 97.9 F (36.6 C) Oral 94 18 96 % -   Current Weight  01/10/15 216 lb 7.9 oz (98.2 kg)     Intake/Output from previous day: 08/20 0701 - 08/21 0700 In: 360 [P.O.:360] Out: 2050 [Urine:2050]    PHYSICAL EXAM:  Heart: RRR Lungs: Clear Wound: Sternal and leg wounds clean and dry.  RLE lower leg "stab" site superficially separated but not draining at present. Extremities: RLE edema and resolving erythema in calf.     Lab Results: CBC: Recent Labs  01/10/15 0525  WBC 8.4  HGB 9.7*  HCT 29.5*  PLT 398   BMET:  Recent Labs  01/10/15 0525  NA 134*  K 4.4  CL 101  CO2 27  GLUCOSE 89  BUN 16  CREATININE 0.88  CALCIUM 8.1*    PT/INR: No results for input(s): LABPROT, INR in the last 72 hours.    Assessment/Plan: S/P Procedure(s) (LRB): CORONARY ARTERY BYPASS GRAFTING (CABG), ON PUMP, TIMES SIX, USING LEFT INTERNAL MAMMARY ARTERY, RIGHT GREATER SAPHENOUS VEIN HARVESTED ENDOSCOPICALLY (N/A) TRANSESOPHAGEAL ECHOCARDIOGRAM (TEE) (N/A)  CV- Maintaining sinus rhythm, BPs stable. Continue po Amio, Eliquis/ASA.  Expected postop blood loss anemia- H/H stable.  Vol overload- continue gentle diuresis.  RLE cellulitis-  improved, continue po Keflex.  Deconditioning- PT/CRPI.  Hopefully to SNF in next few days if he continues to progress.   LOS: 13 days    Rohman,GINA H 01/10/2015  Little change in leg from yesterday Cont current care patient examined and medical record reviewed,agree with above note. Tharon Aquas Trigt III 01/10/2015

## 2015-01-10 NOTE — Progress Notes (Signed)
Patient ambulated 300 ft via rolling walker on room air. Patient tolerated well. Patient resting in bed, call bell within reach.  RN will continue to monitor

## 2015-01-11 MED ORDER — CEPHALEXIN 500 MG PO CAPS: 500.0000 mg | ORAL_CAPSULE | Freq: Four times a day (QID) | ORAL | Status: AC

## 2015-01-11 MED ORDER — TRAMADOL HCL 50 MG PO TABS: 50.0000 mg | ORAL_TABLET | ORAL | Status: DC | PRN

## 2015-01-11 MED ORDER — POTASSIUM CHLORIDE CRYS ER 10 MEQ PO TBCR: 10.0000 meq | EXTENDED_RELEASE_TABLET | Freq: Every day | ORAL | Status: DC

## 2015-01-11 MED ORDER — MENTHOL 3 MG MT LOZG
1.0000 | LOZENGE | OROMUCOSAL | Status: DC | PRN
Start: 2015-01-11 — End: 2015-02-11

## 2015-01-11 MED ORDER — MAGNESIUM CHLORIDE 64 MG PO TBEC: 2.0000 | DELAYED_RELEASE_TABLET | Freq: Every day | ORAL | Status: DC

## 2015-01-11 MED ORDER — FUROSEMIDE 20 MG PO TABS: 20.0000 mg | ORAL_TABLET | Freq: Every day | ORAL | Status: DC

## 2015-01-11 MED ORDER — APIXABAN 5 MG PO TABS: 5.0000 mg | ORAL_TABLET | Freq: Two times a day (BID) | ORAL | Status: DC

## 2015-01-11 MED ORDER — AMIODARONE HCL 200 MG PO TABS
200.0000 mg | ORAL_TABLET | Freq: Every day | ORAL | Status: DC
Start: 2015-01-11 — End: 2015-02-11

## 2015-01-11 MED ORDER — GUAIFENESIN 100 MG/5ML PO SOLN
5.0000 mL | ORAL | Status: DC | PRN
  Administered 2015-01-11: 100 mg via ORAL
  Filled 2015-01-11 (×2): qty 5
  Filled 2015-01-11: qty 25

## 2015-01-11 NOTE — Progress Notes (Signed)
Patient will discharge to [facility] Anticipated discharge date:01/11/15 Family notified: wife at bedside Transportation by [facility]- scheduled at 1:30pm  CSW signing off.  Domenica Reamer, [place] Social Worker [phone]

## 2015-01-11 NOTE — Progress Notes (Signed)
01/11/2015 2:00 PM Pt Discharge to [facility] via [facility].  Report called to nurse at facility. Carney Corners

## 2015-01-11 NOTE — Progress Notes (Signed)
[id number] Education completed with pt and family. Encouraged IS and walks with assistance. Discussed CRP 2 and permission given to send letter of interest to [place]. Gave heart healthy diet. Pt needed to use BSC. Pt had BM and I helped him get cleaned up and put pajamas and robe on since getting ready for discharge. Graylon Good RN BSN 01/11/2015 1:55 PM

## 2015-01-11 NOTE — Progress Notes (Addendum)
      [address]       [place] [address]             [phone]        14 Days Post-Op Procedure(s) (LRB): CORONARY ARTERY BYPASS GRAFTING (CABG), ON PUMP, TIMES SIX, USING LEFT INTERNAL MAMMARY ARTERY, RIGHT GREATER SAPHENOUS VEIN HARVESTED ENDOSCOPICALLY (N/A) TRANSESOPHAGEAL ECHOCARDIOGRAM (TEE) (N/A)  Subjective: Patient with right lower leg pain. He is walking with assistance a couple of times per day.  Objective: Vital signs in last 24 hours: Temp:  [97.5 F (36.4 C)-98.1 F (36.7 C)] 98.1 F (36.7 C) (08/22 0501) Pulse Rate:  [78-95] 86 (08/22 0501) Cardiac Rhythm:  [-] Normal sinus rhythm (08/21 1900) Resp:  [18-20] 20 (08/22 0501) BP: (96-113)/(56-69) 113/69 mmHg (08/22 0501) SpO2:  [96 %-100 %] 98 % (08/22 0501) Weight:  [215 lb 4.8 oz (97.659 kg)] 215 lb 4.8 oz (97.659 kg) (08/22 0501)  Pre op weight 87 kg Current Weight  01/11/15 215 lb 4.8 oz (97.659 kg)      Intake/Output from previous day: 08/21 0701 - 08/22 0700 In: 480 [P.O.:480] Out: Coleman [Urine:1175]   Physical Exam:  Cardiovascular: RRRs. Pulmonary: Clear to auscultation bilaterally; no rales, wheezes, or rhonchi. Abdomen: Soft, non tender, bowel sounds present. Extremities: Bilateral lower extremity edema. RLE with erythema and is tender. Ecchymosis right thigh Wounds: Clean and dry.  No erythema or signs of infection.  Lab Results: CBC: Recent Labs  01/10/15 0525  WBC 8.4  HGB 9.7*  HCT 29.5*  PLT 398   BMET:  Recent Labs  01/10/15 0525  NA 134*  K 4.4  CL 101  CO2 27  GLUCOSE 89  BUN 16  CREATININE 0.88  CALCIUM 8.1*    PT/INR:  Lab Results  Component Value Date   INR 1.85* 12/28/2014   INR 1.80* 12/28/2014   INR 2.18* 12/28/2014   ABG:  INR: Will add last result for INR, ABG once components are confirmed Will add last 4 CBG results once components are confirmed  Assessment/Plan:  1. CV - Previous a fib. Maintaining SR in the 80's. On  Amiodarone 200 mg daily, Eliquis 5 mg bid. Not on BB secondary to labile BP 2.  Pulmonary - On room air. Encourage incentive spirometer 3. Volume Overload - On Lasix 20 mg daily 4.  Acute blood loss anemia - H and H yesterday stable at 9.7 and 29.5 5.ID-On Keflex for RLE cellulitis 6. Robitussin PRN cough 7. Deconditioned-continue with OT/PT. Hopefully, to SNF soon.   ZIMMERMAN,DONIELLE MPA-C 01/11/2015,7:24 AM  Now sinus rhythm, right leg improving Plan transfer to SNF from rehab today I have seen and examined Jacob Reyes and agree with the above assessment  and plan.  [name] MD Beeper [phone] Office [phone] 01/11/2015 12:11 PM

## 2015-01-11 NOTE — Care Management Important Message (Signed)
Important Message  Patient Details  Name: Jacob Reyes MRN: [id number] Date of Birth: December 08, 1925   Medicare Important Message Given:  Yes-fourth notification given    Maryclare Labrador, RN 01/11/2015, 11:23 AM

## 2015-01-11 NOTE — Progress Notes (Signed)
CARDIAC REHAB PHASE I   PRE:  Rate/Rhythm: 91 SR PVCs  BP:  Supine:   Sitting: 93/63  Standing:    SaO2: 97%RA  MODE:  Ambulation: 300 ft   POST:  Rate/Rhythm: 117 ST PVCs  BP:  Supine:   Sitting: 129/87  Standing:    SaO2: 98%RA 0826-857 Pt walked 300 ft on RA with gait belt use, rolling walker and asst x 1 with steady gait. Stopped once to rest. Pt rocked to stand and son assisted me but pt did well standing with assistance. Denied dizziness. BP improved with walk. To recliner with feet up after walk. Pt stated right leg felt better after walk.  Will need to ed when pt going to SNF.   Graylon Good, RN BSN  01/11/2015 8:53 AM

## 2015-01-11 NOTE — Progress Notes (Signed)
01/11/2015 12:42 PM Chest Tubes sutures removed per order.  Pt tolerated well. Carney Corners

## 2015-01-11 NOTE — Progress Notes (Signed)
[place] for Eliquis Indication: afib/flutter  No Known Allergies  Patient Measurements: Height: 6\' 1"  (185.4 cm) Weight: 215 lb 4.8 oz (97.659 kg) IBW/kg (Calculated) : 79.9  Vital Signs: Temp: 98.1 F (36.7 C) (08/22 0501) Temp Source: Oral (08/22 0501) BP: 113/69 mmHg (08/22 0501) Pulse Rate: 86 (08/22 0501)  Labs:  Recent Labs  01/10/15 0525  HGB 9.7*  HCT 29.5*  PLT 398  CREATININE 0.88    Estimated Creatinine Clearance: 72.8 mL/min (by C-G formula based on Cr of 0.88).   Medical History: Past Medical History  Diagnosis Date  . Glaucoma   . Hearing loss   . Reflux   . GERD (gastroesophageal reflux disease)   . Cancer     prostate  . Colon cancer   . Coronary artery disease   . Hypertension   . Abnormal stress test   . Depression   . Anxiety     just started med. for anxiety   . History of hiatal hernia     Medications:  Prescriptions prior to admission  Medication Sig Dispense Refill Last Dose  . aspirin EC 81 MG tablet Take 81 mg by mouth daily.   12/27/2014 at Unknown time  . brimonidine-timolol (COMBIGAN) 0.2-0.5 % ophthalmic solution Place 1 drop into both eyes 2 (two) times daily.   12/27/2014 at Unknown time  . CALCIUM-MAGNESIUM-VITAMIN D PO Take 1 tablet by mouth daily.   Past Week at Unknown time  . cholecalciferol (VITAMIN D) 1000 UNITS tablet Take 1,000 Units by mouth daily.   Past Week at Unknown time  . finasteride (PROSCAR) 5 MG tablet Take 5 mg by mouth daily after supper.    12/27/2014 at Unknown time  . lovastatin (MEVACOR) 40 MG tablet Take 40 mg by mouth daily after supper.    12/27/2014 at Unknown time  . LUMIGAN 0.01 % SOLN Place 1 drop into both eyes at bedtime.    12/27/2014 at Unknown time  . Magnesium 300 MG CAPS Take 300 mg by mouth daily.     . Melatonin 5 MG TABS Take 5 mg by mouth at bedtime as needed (sleep).   12/27/2014 at Unknown time  . metoprolol tartrate (LOPRESSOR) 25 MG tablet Take 1  tablet (25 mg total) by mouth 2 (two) times daily. 60 tablet 3 12/28/2014 at Unknown time  . omeprazole (PRILOSEC) 20 MG capsule Take 20 mg by mouth daily before breakfast.    12/28/2014 at Unknown time  . PARoxetine (PAXIL) 10 MG tablet Take 10 mg by mouth at bedtime as needed.   12/27/2014 at Unknown time  . polyethylene glycol powder (GLYCOLAX/MIRALAX) powder Take 17 g by mouth daily.    Taking  . tamsulosin (FLOMAX) 0.4 MG CAPS capsule Take 0.4 mg by mouth 2 (two) times daily.    12/27/2014 at Unknown time    Assessment: 81 y/o F c/o CP in July>>abnormal stress echo>>Cath 7/21 with 4V CAD>>12/28/14 CABG x 6>>post-op afib/flutter.  Anticoagulation: Post-CABG afib/flutter. Start Eliquis. No bleed issues. Hg 9.7, plt wnl. SCr 0.88, CrCl~72 CHADs2VASc = 3 for age No DVT per dopplers. Superficial thrombosis in pre-harvest site Plan Eliquis for at least a month.   Goal of Therapy:  Therapeutic oral anticoagulation Monitor platelets by anticoagulation protocol: Yes   Plan:  Eliquis 5mg  BID CBC periodically, mon s/sx bleeding   Elicia Lamp, PharmD Clinical Pharmacist Pager [phone] 01/11/2015 12:10 PM

## 2015-01-12 DIAGNOSIS — I480 Paroxysmal atrial fibrillation: Secondary | ICD-10-CM | POA: Diagnosis present

## 2015-01-13 LAB — MISC LABCORP TEST (SEND OUT): Labcorp test code: 706705

## 2015-01-21 ENCOUNTER — Encounter
Admission: RE | Admit: 2015-01-21 | Discharge: 2015-01-21 | Disposition: A | Discharge status: Home / Self Care | Payer: Medicare Other | Source: Ambulatory Visit | Attending: Internal Medicine | Admitting: Internal Medicine

## 2015-01-21 DIAGNOSIS — I480 Paroxysmal atrial fibrillation: Secondary | ICD-10-CM | POA: Insufficient documentation

## 2015-02-02 ENCOUNTER — Ambulatory Visit (INDEPENDENT_AMBULATORY_CARE_PROVIDER_SITE_OTHER): Payer: Self-pay | Admitting: *Deleted

## 2015-02-02 DIAGNOSIS — Z951 Presence of aortocoronary bypass graft: Secondary | ICD-10-CM

## 2015-02-02 DIAGNOSIS — Z4802 Encounter for removal of sutures: Secondary | ICD-10-CM

## 2015-02-02 DIAGNOSIS — I2510 Atherosclerotic heart disease of native coronary artery without angina pectoris: Secondary | ICD-10-CM

## 2015-02-02 DIAGNOSIS — I25111 Atherosclerotic heart disease of native coronary artery with angina pectoris with documented spasm: Secondary | ICD-10-CM

## 2015-02-02 NOTE — Progress Notes (Signed)
Jacob Reyes had CABG X 6 on 12/28/14 and was discharged to skilled nursing for 2 weeks and discharged to home a little over a week ago.  During this time his right leg EVH sutures have not been removed.  The family wishes this to be done before his appointment with Dr. Servando Snare next week.  On exam, his sternal incision and previous chest tube sites are very well healed.  There is slight redness of the thigh sutures, but after this length of time I am sure this is simple suture irritation.  He has swelling to both lower extremities and is wearing post surgery compression stockings.  The left swelling is chronic per the family and the right is post op but has improved.  Dr. Ubaldo Glassing has adjusted his Lasix and will see him again this Friday.  He will return as scheduled to see Dr. Servando Snare with a cxr.  The sutures were removed from the two sites without difficulty.

## 2015-02-04 ENCOUNTER — Ambulatory Visit: Payer: Medicare Other

## 2015-02-10 ENCOUNTER — Other Ambulatory Visit: Payer: Self-pay | Admitting: Cardiothoracic Surgery

## 2015-02-10 DIAGNOSIS — Z951 Presence of aortocoronary bypass graft: Secondary | ICD-10-CM

## 2015-02-11 ENCOUNTER — Encounter: Payer: Self-pay | Admitting: Cardiothoracic Surgery

## 2015-02-11 ENCOUNTER — Ambulatory Visit (INDEPENDENT_AMBULATORY_CARE_PROVIDER_SITE_OTHER): Payer: Self-pay | Admitting: Cardiothoracic Surgery

## 2015-02-11 ENCOUNTER — Ambulatory Visit
Admission: RE | Admit: 2015-02-11 | Discharge: 2015-02-11 | Disposition: A | Discharge status: Home / Self Care | Payer: Medicare Other | Source: Ambulatory Visit | Attending: Cardiothoracic Surgery | Admitting: Cardiothoracic Surgery

## 2015-02-11 VITALS — BP 157/90 | HR 75 | Resp 20 | Ht 73.0 in | Wt 194.0 lb

## 2015-02-11 DIAGNOSIS — Z951 Presence of aortocoronary bypass graft: Secondary | ICD-10-CM

## 2015-02-11 DIAGNOSIS — I2510 Atherosclerotic heart disease of native coronary artery without angina pectoris: Secondary | ICD-10-CM

## 2015-02-11 NOTE — Progress Notes (Signed)
[address]       [place] [address]             [phone]      Abhishek Z Manera [place] Medical Record #[id number] Date of Birth: Jan 24, 1926  Referring: Teodoro Spray, MD Primary Care: Rusty Aus., MD  Chief Complaint:   POST OP FOLLOW UP 12/28/2014 OPERATIVE REPORT PREOPERATIVE DIAGNOSIS: Severe 3-vessel coronary artery disease. POSTOPERATIVE DIAGNOSIS: Severe 3-vessel coronary artery disease. SURGICAL PROCEDURE: Coronary artery bypass grafting x6, with left internal mammary to the left anterior descending coronary artery in 2 sites, in the mid vessel and the distal LAD, reverse saphenous vein graft to the diagonal, sequential reverse saphenous vein graft to the first and second obtuse marginal, and reverse saphenous vein graft to the mid posterior descending coronary artery, with right leg greater saphenous vein harvesting from the thigh and calf. SURGEON: Lanelle Bal, M.D.  History of Present Illness:     Patient making reasonable progression postop. Was d/c to snf now back home. Patient unaware of any recurrent AFIB. He is not taking Cordarone currently, not sure when it was stopped but  he thinks when he left snf.Marland Kitchen He is increasing his activity.     Past Medical History  Diagnosis Date  . Glaucoma   . Hearing loss   . Reflux   . GERD (gastroesophageal reflux disease)   . Cancer     prostate  . Colon cancer   . Coronary artery disease   . Hypertension   . Abnormal stress test   . Depression   . Anxiety     just started med. for anxiety   . History of hiatal hernia      History  Smoking status  . Never Smoker   Smokeless tobacco  . Never Used    History  Alcohol Use No     No Known Allergies  Current Outpatient Prescriptions  Medication Sig Dispense Refill  . apixaban (ELIQUIS) 5 MG TABS tablet Take 1 tablet (5 mg total) by mouth every 12 (twelve) hours. 60 tablet   . aspirin EC 81 MG tablet Take 81 mg  by mouth daily.    . brimonidine-timolol (COMBIGAN) 0.2-0.5 % ophthalmic solution Place 1 drop into both eyes 2 (two) times daily.    . cholecalciferol (VITAMIN D) 1000 UNITS tablet Take 1,000 Units by mouth daily.    . finasteride (PROSCAR) 5 MG tablet Take 5 mg by mouth daily after supper.     . furosemide (LASIX) 20 MG tablet Take 1 tablet (20 mg total) by mouth daily. 30 tablet   . levothyroxine (SYNTHROID, LEVOTHROID) 75 MCG tablet Take 75 mcg by mouth daily before breakfast.     . lovastatin (MEVACOR) 40 MG tablet Take 40 mg by mouth daily after supper.     Marland Kitchen LUMIGAN 0.01 % SOLN Place 1 drop into both eyes at bedtime.     . magnesium chloride (SLOW-MAG) 64 MG TBEC SR tablet Take 2 tablets (128 mg total) by mouth daily. 60 tablet   . Melatonin 5 MG TABS Take 5 mg by mouth at bedtime as needed (sleep).    Marland Kitchen omeprazole (PRILOSEC) 20 MG capsule Take 20 mg by mouth daily before breakfast.     . polyethylene glycol powder (GLYCOLAX/MIRALAX) powder Take 17 g by mouth daily.     . potassium chloride SA (K-DUR,KLOR-CON) 10 MEQ tablet Take 1 tablet (10 mEq total) by mouth daily.    Marland Kitchen  spironolactone (ALDACTONE) 25 MG tablet Take by mouth.    . tamsulosin (FLOMAX) 0.4 MG CAPS capsule Take 0.4 mg by mouth 2 (two) times daily.      No current facility-administered medications for this visit.       Physical Exam: BP 157/90 mmHg  Pulse 75  Resp 20  Ht 6\' 1"  (1.854 m)  Wt 194 lb (87.998 kg)  BMI 25.60 kg/m2  SpO2 97%  General appearance: alert, cooperative, appears stated age and no distress Neurologic: intact Heart: regular rate and rhythm, S1, S2 normal, no murmur, click, rub or gallop Lungs: diminished breath sounds LLL Abdomen: soft, non-tender; bowel sounds normal; no masses,  no organomegaly Extremities: extremities normal, atraumatic, no cyanosis or edema Wound: sternum satble healing well, still with bilaterial pedal edema, right thigh healing wellbut still with right thigh swelling     Diagnostic Studies & Laboratory data:     Recent Radiology Findings:   Dg Chest 2 View  02/11/2015   CLINICAL DATA:  CABG.  EXAM: CHEST  2 VIEW  COMPARISON:  None.  FINDINGS: Mediastinum and hilar structures normal. Left lower lobe infiltrate and/or atelectasis noted. Left pleural effusion. Right lung clear. Prior CABG. Cardiomegaly. No pulmonary venous congestion.  IMPRESSION: 1. Left lower lobe atelectasis and/or infiltrate with left pleural effusion. 2. Prior CABG.  Cardiomegaly.   Electronically Signed   By: Marcello Moores  Register   On: 02/11/2015 12:55      Recent Lab Findings: Lab Results  Component Value Date   WBC 8.4 01/10/2015   HGB 9.7* 01/10/2015   HCT 29.5* 01/10/2015   PLT 398 01/10/2015   GLUCOSE 89 01/10/2015   ALT 10* 12/31/2014   AST 19 12/31/2014   NA 134* 01/10/2015   K 4.4 01/10/2015   CL 101 01/10/2015   CREATININE 0.88 01/10/2015   BUN 16 01/10/2015   CO2 27 01/10/2015   TSH 3.385 12/30/2014   INR 1.85* 12/28/2014   HGBA1C 5.7* 12/25/2014      Assessment / Plan:    progressing following cabg Left pleural effusion present, will follow up with chest xray in two weeks consider thoracentesis if not improving with diuretic  encouraged to start cardiac rehab soon return two weeks    [name] MD      [address] [place] [address] Office [phone]   Beeper [phone]  02/11/2015 9:26 PM

## 2015-02-23 ENCOUNTER — Encounter: Payer: Medicare Other | Attending: Cardiology | Admitting: *Deleted

## 2015-02-23 ENCOUNTER — Other Ambulatory Visit: Payer: Self-pay | Admitting: Cardiothoracic Surgery

## 2015-02-23 VITALS — Ht 72.2 in | Wt 185.8 lb

## 2015-02-23 DIAGNOSIS — Z951 Presence of aortocoronary bypass graft: Secondary | ICD-10-CM | POA: Diagnosis not present

## 2015-02-23 NOTE — Progress Notes (Signed)
Cardiac Individual Treatment Plan  Patient Details  Name: Jacob Reyes MRN: [id number] Date of Birth: 1926-01-13 Referring Provider:  Teodoro Spray, MD  Initial Encounter Date: Date: 02/23/15  Visit Diagnosis: S/P CABG x 6  Patient's Home Medications on Admission:  Current outpatient prescriptions:  .  apixaban (ELIQUIS) 5 MG TABS tablet, Take 1 tablet (5 mg total) by mouth every 12 (twelve) hours., Disp: 60 tablet, Rfl:  .  aspirin EC 81 MG tablet, Take 81 mg by mouth daily., Disp: , Rfl:  .  brimonidine-timolol (COMBIGAN) 0.2-0.5 % ophthalmic solution, Place 1 drop into both eyes 2 (two) times daily., Disp: , Rfl:  .  cholecalciferol (VITAMIN D) 1000 UNITS tablet, Take 1,000 Units by mouth daily., Disp: , Rfl:  .  finasteride (PROSCAR) 5 MG tablet, Take 5 mg by mouth daily after supper. , Disp: , Rfl:  .  levothyroxine (SYNTHROID, LEVOTHROID) 75 MCG tablet, Take 75 mcg by mouth daily before breakfast. , Disp: , Rfl:  .  lovastatin (MEVACOR) 40 MG tablet, Take 40 mg by mouth daily after supper. , Disp: , Rfl:  .  LUMIGAN 0.01 % SOLN, Place 1 drop into both eyes at bedtime. , Disp: , Rfl:  .  magnesium chloride (SLOW-MAG) 64 MG TBEC SR tablet, Take 2 tablets (128 mg total) by mouth daily., Disp: 60 tablet, Rfl:  .  omeprazole (PRILOSEC) 20 MG capsule, Take 20 mg by mouth daily before breakfast. , Disp: , Rfl:  .  polyethylene glycol powder (GLYCOLAX/MIRALAX) powder, Take 17 g by mouth daily. , Disp: , Rfl:  .  potassium chloride SA (K-DUR,KLOR-CON) 10 MEQ tablet, Take 1 tablet (10 mEq total) by mouth daily., Disp: , Rfl:  .  tamsulosin (FLOMAX) 0.4 MG CAPS capsule, Take 0.4 mg by mouth 2 (two) times daily. , Disp: , Rfl:  .  furosemide (LASIX) 20 MG tablet, Take 1 tablet (20 mg total) by mouth daily. (Patient not taking: Reported on 02/23/2015), Disp: 30 tablet, Rfl:  .  Melatonin 5 MG TABS, Take 5 mg by mouth at bedtime as needed (sleep)., Disp: , Rfl:  .  spironolactone (ALDACTONE)  25 MG tablet, Take by mouth., Disp: , Rfl:   Past Medical History: Past Medical History  Diagnosis Date  . Glaucoma   . Hearing loss   . Reflux   . GERD (gastroesophageal reflux disease)   . Cancer     prostate  . Colon cancer   . Coronary artery disease   . Hypertension   . Abnormal stress test   . Depression   . Anxiety     just started med. for anxiety   . History of hiatal hernia     Tobacco Use: History  Smoking status  . Never Smoker   Smokeless tobacco  . Never Used    Labs: Recent Review Flowsheet Data    Labs for ITP Cardiac and Pulmonary Rehab Latest Ref Rng 12/28/2014 12/28/2014 12/29/2014 12/29/2014 12/29/2014   PHART 7.350 - 7.450 - 7.452(H) 7.411 7.418 -   PCO2ART 35.0 - 45.0 mmHg - 35.2 32.5(L) 31.2(L) -   HCO3 20.0 - 24.0 mEq/L - 24.5(H) 20.7 20.2 -   TCO2 0 - 100 mmol/L 21 26 22 21 20    ACIDBASEDEF 0.0 - 2.0 mmol/L - - 4.0(H) 4.0(H) -   O2SAT - - 99.0 96.0 93.0 -       Exercise Target Goals: Date: 02/23/15  Exercise Program Goal: Individual exercise prescription set with THRR, safety &  activity barriers. Participant demonstrates ability to understand and report RPE using BORG scale, to self-measure pulse accurately, and to acknowledge the importance of the exercise prescription.  Exercise Prescription Goal: Starting with aerobic activity 30 plus minutes a day, 3 days per week for initial exercise prescription. Provide home exercise prescription and guidelines that participant acknowledges understanding prior to discharge.  Activity Barriers & Risk Stratification:     Activity Barriers & Risk Stratification - 02/23/15 1105    Activity Barriers & Risk Stratification   Activity Barriers None   Risk Stratification High      6 Minute Walk:     6 Minute Walk      02/23/15 1438       6 Minute Walk   Phase Initial     Distance 650 feet     Walk Time 6 minutes     Resting HR 84 bpm     Resting BP 110/70 mmHg     Max Ex. HR 110 bpm     Max Ex.  BP 132/62 mmHg     RPE 12     Symptoms No        Initial Exercise Prescription:     Initial Exercise Prescription - 02/23/15 1400    Date of Initial Exercise Prescription   Date 02/23/15   Treadmill   MPH 1.2   Grade 0   Minutes 10  Use intervals to complete time goals if necessary   Bike   Level 0.2   Minutes 10   Recumbant Bike   Level 3   RPM 40   Watts 25   Minutes 15   NuStep   Level 2   Watts 30   Minutes 15   Arm Ergometer   Level 1   Watts 8   Minutes 10   Arm/Foot Ergometer   Level 4   Watts 15   Minutes 10   Cybex   Level 1   RPM 50   Minutes 10   Recumbant Elliptical   Level 1   RPM 40   Watts 10   Minutes 10   Elliptical   Level 1   Speed 3   Minutes 1   REL-XR   Level 2   Watts 30   Minutes 15   Prescription Details   Frequency (times per week) 3   Duration Progress to 30 minutes of continuous aerobic without signs/symptoms of physical distress   Intensity   THRR REST +  30   Ratings of Perceived Exertion 11-15   Progression Continue progressive overload as per policy without signs/symptoms or physical distress.   Resistance Training   Training Prescription Yes   Weight 2   Reps 10-15      Exercise Prescription Changes:   Discharge Exercise Prescription (Final Exercise Prescription Changes):   Nutrition:  Target Goals: Understanding of nutrition guidelines, daily intake of sodium 1500mg , cholesterol 200mg , calories 30% from fat and 7% or less from saturated fats, daily to have 5 or more servings of fruits and vegetables.  Biometrics:     Pre Biometrics - 02/23/15 1437    Pre Biometrics   Height 6' 0.2" (1.834 m)   Weight 185 lb 12.8 oz (84.278 kg)   Waist Circumference 40.5 inches   Hip Circumference 44 inches   Waist to Hip Ratio 0.92 %   BMI (Calculated) 25.1       Nutrition Therapy Plan and Nutrition Goals:     Nutrition Therapy & Goals -  02/23/15 1107    Nutrition Therapy   Drug/Food Interactions  Statins/Certain Fruits   Personal Nutrition Goals   Personal Goal #1 Sodium level low lately so Dr. Ammie Ferrier office called him yesterday and said to increase his sodium.       Nutrition Discharge: Rate Your Plate Scores:     Rate Your Plate - 62/83/15 1761    Rate Your Plate Scores   Pre Score --  Not done by patient age 34      Nutrition Goals Re-Evaluation:   Psychosocial: Target Goals: Acknowledge presence or absence of depression, maximize coping skills, provide positive support system. Participant is able to verbalize types and ability to use techniques and skills needed for reducing stress and depression.  Initial Review & Psychosocial Screening:     Initial Psych Review & Screening - 02/23/15 1109    Initial Review   Current issues with Current Sleep Concerns   Family Dynamics   Good Support System? Yes   Comments Discussed maybe sleeping in recliner since his upper chest has some burning where he removed an artery to use. Dr. Jobie Quaker told them some people have that and sit in waiting room with their shirts open to not touch that site. (patient reports)      Quality of Life Scores:     Quality of Life - 02/23/15 1506    Quality of Life Scores   Health/Function Pre --  Not done by patient. [name] said he is 10 and really doesn't have any major problems at his age and his wife agreed.       PHQ-9:     Recent Review Flowsheet Data    Depression screen [facility] 2/9 02/23/2015   Decreased Interest 0   Down, Depressed, Hopeless 0   PHQ - 2 Score 0   Altered sleeping 3   Tired, decreased energy 0   Change in appetite 0   Feeling bad or failure about yourself  0   Trouble concentrating 0   Moving slowly or fidgety/restless 0   Suicidal thoughts 0   PHQ-9 Score 3      Psychosocial Evaluation and Intervention:   Psychosocial Re-Evaluation:   Vocational Rehabilitation: Provide vocational rehab assistance to qualifying candidates.   Vocational Rehab  Evaluation & Intervention:     Vocational Rehab - 02/23/15 1107    Initial Vocational Rehab Evaluation & Intervention   Assessment shows need for Vocational Rehabilitation No      Education: Education Goals: Education classes will be provided on a weekly basis, covering required topics. Participant will state understanding/return demonstration of topics presented.  Learning Barriers/Preferences:     Learning Barriers/Preferences - 02/23/15 1503    Learning Barriers/Preferences   Learning Barriers Hearing      Education Topics: General Nutrition Guidelines/Fats and Fiber: -Group instruction provided by verbal, written material, models and posters to present the general guidelines for heart healthy nutrition. Gives an explanation and review of dietary fats and fiber.   Controlling Sodium/Reading Food Labels: -Group verbal and written material supporting the discussion of sodium use in heart healthy nutrition. Review and explanation with models, verbal and written materials for utilization of the food label.   Exercise Physiology & Risk Factors: - Group verbal and written instruction with models to review the exercise physiology of the cardiovascular system and associated critical values. Details cardiovascular disease risk factors and the goals associated with each risk factor.   Aerobic Exercise & Resistance Training: - Gives group verbal and written discussion  on the health impact of inactivity. On the components of aerobic and resistive training programs and the benefits of this training and how to safely progress through these programs.   Flexibility, Balance, General Exercise Guidelines: - Provides group verbal and written instruction on the benefits of flexibility and balance training programs. Provides general exercise guidelines with specific guidelines to those with heart or lung disease. Demonstration and skill practice provided.   Stress Management: - Provides  group verbal and written instruction about the health risks of elevated stress, cause of high stress, and healthy ways to reduce stress.   Depression: - Provides group verbal and written instruction on the correlation between heart/lung disease and depressed mood, treatment options, and the stigmas associated with seeking treatment.   Anatomy & Physiology of the Heart: - Group verbal and written instruction and models provide basic cardiac anatomy and physiology, with the coronary electrical and arterial systems. Review of: AMI, Angina, Valve disease, Heart Failure, Cardiac Arrhythmia, Pacemakers, and the ICD.   Cardiac Procedures: - Group verbal and written instruction and models to describe the testing methods done to diagnose heart disease. Reviews the outcomes of the test results. Describes the treatment choices: Medical Management, Angioplasty, or Coronary Bypass Surgery.   Cardiac Medications: - Group verbal and written instruction to review commonly prescribed medications for heart disease. Reviews the medication, class of the drug, and side effects. Includes the steps to properly store meds and maintain the prescription regimen.   Go Sex-Intimacy & Heart Disease, Get SMART - Goal Setting: - Group verbal and written instruction through game format to discuss heart disease and the return to sexual intimacy. Provides group verbal and written material to discuss and apply goal setting through the application of the S.M.A.R.T. Method.   Other Matters of the Heart: - Provides group verbal, written materials and models to describe Heart Failure, Angina, Valve Disease, and Diabetes in the realm of heart disease. Includes description of the disease process and treatment options available to the cardiac patient.   Exercise & Equipment Safety: - Individual verbal instruction and demonstration of equipment use and safety with use of the equipment.          Cardiac Rehab from 02/23/2015 in  [facility] Cardiac Rehab   Date  02/23/15   Educator  C. Dillon Livermore,RN   Instruction Review Code  1- partially meets, needs review/practice      Infection Prevention: - Provides verbal and written material to individual with discussion of infection control including proper hand washing and proper equipment cleaning during exercise session.      Cardiac Rehab from 02/23/2015 in [facility] Cardiac Rehab   Date  02/23/15   Educator  C. Taviana Westergren,RN   Instruction Review Code  2- meets goals/outcomes      Falls Prevention: - Provides verbal and written material to individual with discussion of falls prevention and safety.      Cardiac Rehab from 02/23/2015 in [facility] Cardiac Rehab   Date  02/23/15   Educator  C. Mohammad Granade,RN   Instruction Review Code  2- meets goals/outcomes      Diabetes: - Individual verbal and written instruction to review signs/symptoms of diabetes, desired ranges of glucose level fasting, after meals and with exercise. Advice that pre and post exercise glucose checks will be done for 3 sessions at entry of program.    Knowledge Questionnaire Score:     Knowledge Questionnaire Score - 02/23/15 1503    Knowledge Questionnaire Score   Pre Score Not done  by patient      Personal Goals and Risk Factors at Admission:     Personal Goals and Risk Factors at Admission - 02/23/15 1506    Personal Goals and Risk Factors on Admission   Hypertension No   Lipids No   Stress No      Personal Goals and Risk Factors Review:    Personal Goals Discharge (Final Personal Goals and Risk Factors Review):     Comments: [name] is anxious to get his strength back. Dr. Myrtis Ser office called him yesterday and said his Sodium blood work was low and he was told to eat sodium/salt which he has eaten a little more salt today. He said he is so tired and now knows why due to his sodium level being low. MD stopped his lasix.

## 2015-02-23 NOTE — Patient Instructions (Signed)
Patient Instructions  Patient Details  Name: Jacob Reyes MRN: [id number] Date of Birth: May 20, 1926 Referring Provider:  Teodoro Spray, MD  Below are the personal goals you chose as well as exercise and nutrition goals. Our goal is to help you keep on track towards obtaining and maintaining your goals. We will be discussing your progress on these goals with you throughout the program.  Initial Exercise Prescription:     Initial Exercise Prescription - 02/23/15 1400    Date of Initial Exercise Prescription   Date 02/23/15   Treadmill   MPH 1.2   Grade 0   Minutes 10  Use intervals to complete time goals if necessary   Bike   Level 0.2   Minutes 10   Recumbant Bike   Level 3   RPM 40   Watts 25   Minutes 15   NuStep   Level 2   Watts 30   Minutes 15   Arm Ergometer   Level 1   Watts 8   Minutes 10   Arm/Foot Ergometer   Level 4   Watts 15   Minutes 10   Cybex   Level 1   RPM 50   Minutes 10   Recumbant Elliptical   Level 1   RPM 40   Watts 10   Minutes 10   Elliptical   Level 1   Speed 3   Minutes 1   REL-XR   Level 2   Watts 30   Minutes 15   Prescription Details   Frequency (times per week) 3   Duration Progress to 30 minutes of continuous aerobic without signs/symptoms of physical distress   Intensity   THRR REST +  30   Ratings of Perceived Exertion 11-15   Progression Continue progressive overload as per policy without signs/symptoms or physical distress.   Resistance Training   Training Prescription Yes   Weight 2   Reps 10-15      Exercise Goals: Frequency: Be able to perform aerobic exercise three times per week working toward 3-5 days per week.  Intensity: Work with a perceived exertion of 11 (fairly light) - 15 (hard) as tolerated. Follow your new exercise prescription and watch for changes in prescription as you progress with the program. Changes will be reviewed with you when they are made.  Duration: You should be able to do  30 minutes of continuous aerobic exercise in addition to a 5 minute warm-up and a 5 minute cool-down routine.  Nutrition Goals: Your personal nutrition goals will be established when you do your nutrition analysis with the dietician.  The following are nutrition guidelines to follow: Cholesterol < 200mg /day Sodium < 1500mg /day Fiber: Men over 50 yrs - 30 grams per day  Personal Goals:     Personal Goals and Risk Factors at Admission - 02/23/15 1506    Personal Goals and Risk Factors on Admission   Hypertension No   Lipids No   Stress No      Tobacco Use Initial Evaluation: History  Smoking status  . Never Smoker   Smokeless tobacco  . Never Used    Copy of goals given to participant.

## 2015-02-23 NOTE — Progress Notes (Signed)
Daily Session Note  Patient Details  Name: Jacob Reyes MRN: [id number] Date of Birth: 1925/12/15 Referring Provider:  Teodoro Spray, MD  Encounter Date: 02/23/2015  Check In:     Session Check In - 02/23/15 1107    Check-In   Staff Present Gerlene Burdock RN, BSN;Steven Way BS, ACSM EP-C, Exercise Physiologist         Goals Met:  Personal goals reviewed  Goals Unmet:  Not Applicable  Goals Comments: REady to start Cardiac Rehab.    Dr. Emily Filbert is Medical Director for [place] and [facility].

## 2015-02-23 NOTE — Progress Notes (Signed)
Cardiac Individual Treatment Plan  Patient Details  Name: DESHAY BLUMENFELD MRN: [id number] Date of Birth: [birth year] Referring Provider:  Teodoro Spray, MD  Initial Encounter Date:    Visit Diagnosis: No diagnosis found.  Patient's Home Medications on Admission:  Current outpatient prescriptions:  .  apixaban (ELIQUIS) 5 MG TABS tablet, Take 1 tablet (5 mg total) by mouth every 12 (twelve) hours., Disp: 60 tablet, Rfl:  .  aspirin EC 81 MG tablet, Take 81 mg by mouth daily., Disp: , Rfl:  .  brimonidine-timolol (COMBIGAN) 0.2-0.5 % ophthalmic solution, Place 1 drop into both eyes 2 (two) times daily., Disp: , Rfl:  .  cholecalciferol (VITAMIN D) 1000 UNITS tablet, Take 1,000 Units by mouth daily., Disp: , Rfl:  .  finasteride (PROSCAR) 5 MG tablet, Take 5 mg by mouth daily after supper. , Disp: , Rfl:  .  levothyroxine (SYNTHROID, LEVOTHROID) 75 MCG tablet, Take 75 mcg by mouth daily before breakfast. , Disp: , Rfl:  .  lovastatin (MEVACOR) 40 MG tablet, Take 40 mg by mouth daily after supper. , Disp: , Rfl:  .  LUMIGAN 0.01 % SOLN, Place 1 drop into both eyes at bedtime. , Disp: , Rfl:  .  magnesium chloride (SLOW-MAG) 64 MG TBEC SR tablet, Take 2 tablets (128 mg total) by mouth daily., Disp: 60 tablet, Rfl:  .  omeprazole (PRILOSEC) 20 MG capsule, Take 20 mg by mouth daily before breakfast. , Disp: , Rfl:  .  polyethylene glycol powder (GLYCOLAX/MIRALAX) powder, Take 17 g by mouth daily. , Disp: , Rfl:  .  potassium chloride SA (K-DUR,KLOR-CON) 10 MEQ tablet, Take 1 tablet (10 mEq total) by mouth daily., Disp: , Rfl:  .  tamsulosin (FLOMAX) 0.4 MG CAPS capsule, Take 0.4 mg by mouth 2 (two) times daily. , Disp: , Rfl:  .  furosemide (LASIX) 20 MG tablet, Take 1 tablet (20 mg total) by mouth daily. (Patient not taking: Reported on 02/23/2015), Disp: 30 tablet, Rfl:  .  Melatonin 5 MG TABS, Take 5 mg by mouth at bedtime as needed (sleep)., Disp: , Rfl:  .  spironolactone (ALDACTONE) 25 MG  tablet, Take by mouth., Disp: , Rfl:   Past Medical History: Past Medical History  Diagnosis Date  . Glaucoma   . Hearing loss   . Reflux   . GERD (gastroesophageal reflux disease)   . Cancer     prostate  . Colon cancer   . Coronary artery disease   . Hypertension   . Abnormal stress test   . Depression   . Anxiety     just started med. for anxiety   . History of hiatal hernia     Tobacco Use: History  Smoking status  . Never Smoker   Smokeless tobacco  . Never Used    Labs: Recent Review Flowsheet Data    Labs for ITP Cardiac and Pulmonary Rehab Latest Ref Rng 79/12/2014 79/12/2014 12/29/2014 12/29/2014 12/29/2014   PHART 7.350 - 7.450 - 7.452(H) 7.411 7.418 -   PCO2ART 35.0 - 45.0 mmHg - 35.2 32.5(L) 31.2(L) -   HCO3 20.0 - 24.0 mEq/L - 24.5(H) 20.7 20.2 -   TCO2 0 - 100 mmol/L 21 26 22 21 20    ACIDBASEDEF 0.0 - 2.0 mmol/L - - 4.0(H) 4.0(H) -   O2SAT - - 99.0 96.0 93.0 -       Exercise Target Goals:    Exercise Program Goal: Individual exercise prescription set with THRR, safety & activity  barriers. Participant demonstrates ability to understand and report RPE using BORG scale, to self-measure pulse accurately, and to acknowledge the importance of the exercise prescription.  Exercise Prescription Goal: Starting with aerobic activity 30 plus minutes a day, 3 days per week for initial exercise prescription. Provide home exercise prescription and guidelines that participant acknowledges understanding prior to discharge.  Activity Barriers & Risk Stratification:     Activity Barriers & Risk Stratification - 02/23/15 1105    Activity Barriers & Risk Stratification   Activity Barriers None   Risk Stratification High      6 Minute Walk:   Initial Exercise Prescription:   Exercise Prescription Changes:   Discharge Exercise Prescription (Final Exercise Prescription Changes):   Nutrition:  Target Goals: Understanding of nutrition guidelines, daily intake of  sodium 1500mg , cholesterol 200mg , calories 30% from fat and 7% or less from saturated fats, daily to have 5 or more servings of fruits and vegetables.  Biometrics:    Nutrition Therapy Plan and Nutrition Goals:     Nutrition Therapy & Goals - 02/23/15 1107    Nutrition Therapy   Drug/Food Interactions Statins/Certain Fruits   Personal Nutrition Goals   Personal Goal #1 Sodium level low lately so Dr. Ammie Ferrier office called him yesterday and said to increase his sodium.       Nutrition Discharge: Rate Your Plate Scores:   Nutrition Goals Re-Evaluation:   Psychosocial: Target Goals: Acknowledge presence or absence of depression, maximize coping skills, provide positive support system. Participant is able to verbalize types and ability to use techniques and skills needed for reducing stress and depression.  Initial Review & Psychosocial Screening:     Initial Psych Review & Screening - 02/23/15 1109    Initial Review   Current issues with Current Sleep Concerns   Family Dynamics   Good Support System? Yes   Comments Discussed maybe sleeping in recliner since his upper chest has some burning where he removed an artery to use. Dr. Jobie Quaker told them some people have that and sit in waiting room with their shirts open to not touch that site. (patient reports)      Quality of Life Scores:   PHQ-9:     Recent Review Flowsheet Data    Depression screen [facility] 2/9 02/23/2015   Decreased Interest 0   Down, Depressed, Hopeless 0   PHQ - 2 Score 0   Altered sleeping 3   Tired, decreased energy 0   Change in appetite 0   Feeling bad or failure about yourself  0   Trouble concentrating 0   Moving slowly or fidgety/restless 0   Suicidal thoughts 0   PHQ-9 Score 3      Psychosocial Evaluation and Intervention:   Psychosocial Re-Evaluation:   Vocational Rehabilitation: Provide vocational rehab assistance to qualifying candidates.   Vocational Rehab Evaluation &  Intervention:     Vocational Rehab - 02/23/15 1107    Initial Vocational Rehab Evaluation & Intervention   Assessment shows need for Vocational Rehabilitation No      Education: Education Goals: Education classes will be provided on a weekly basis, covering required topics. Participant will state understanding/return demonstration of topics presented.  Learning Barriers/Preferences:     Learning Barriers/Preferences - 02/23/15 1106    Learning Barriers/Preferences   Learning Barriers None;Hearing   Learning Preferences None      Education Topics: General Nutrition Guidelines/Fats and Fiber: -Group instruction provided by verbal, written material, models and posters to present the general guidelines for  heart healthy nutrition. Gives an explanation and review of dietary fats and fiber.   Controlling Sodium/Reading Food Labels: -Group verbal and written material supporting the discussion of sodium use in heart healthy nutrition. Review and explanation with models, verbal and written materials for utilization of the food label.   Exercise Physiology & Risk Factors: - Group verbal and written instruction with models to review the exercise physiology of the cardiovascular system and associated critical values. Details cardiovascular disease risk factors and the goals associated with each risk factor.   Aerobic Exercise & Resistance Training: - Gives group verbal and written discussion on the health impact of inactivity. On the components of aerobic and resistive training programs and the benefits of this training and how to safely progress through these programs.   Flexibility, Balance, General Exercise Guidelines: - Provides group verbal and written instruction on the benefits of flexibility and balance training programs. Provides general exercise guidelines with specific guidelines to those with heart or lung disease. Demonstration and skill practice provided.   Stress  Management: - Provides group verbal and written instruction about the health risks of elevated stress, cause of high stress, and healthy ways to reduce stress.   Depression: - Provides group verbal and written instruction on the correlation between heart/lung disease and depressed mood, treatment options, and the stigmas associated with seeking treatment.   Anatomy & Physiology of the Heart: - Group verbal and written instruction and models provide basic cardiac anatomy and physiology, with the coronary electrical and arterial systems. Review of: AMI, Angina, Valve disease, Heart Failure, Cardiac Arrhythmia, Pacemakers, and the ICD.   Cardiac Procedures: - Group verbal and written instruction and models to describe the testing methods done to diagnose heart disease. Reviews the outcomes of the test results. Describes the treatment choices: Medical Management, Angioplasty, or Coronary Bypass Surgery.   Cardiac Medications: - Group verbal and written instruction to review commonly prescribed medications for heart disease. Reviews the medication, class of the drug, and side effects. Includes the steps to properly store meds and maintain the prescription regimen.   Go Sex-Intimacy & Heart Disease, Get SMART - Goal Setting: - Group verbal and written instruction through game format to discuss heart disease and the return to sexual intimacy. Provides group verbal and written material to discuss and apply goal setting through the application of the S.M.A.R.T. Method.   Other Matters of the Heart: - Provides group verbal, written materials and models to describe Heart Failure, Angina, Valve Disease, and Diabetes in the realm of heart disease. Includes description of the disease process and treatment options available to the cardiac patient.   Exercise & Equipment Safety: - Individual verbal instruction and demonstration of equipment use and safety with use of the equipment.          Cardiac  Rehab from 02/23/2015 in [facility] Cardiac Rehab   Date  02/23/15   Educator  C. Jachob Mcclean,RN   Instruction Review Code  1- partially meets, needs review/practice      Infection Prevention: - Provides verbal and written material to individual with discussion of infection control including proper hand washing and proper equipment cleaning during exercise session.      Cardiac Rehab from 02/23/2015 in [facility] Cardiac Rehab   Date  02/23/15   Educator  C. Deztiny Sarra,RN   Instruction Review Code  2- meets goals/outcomes      Falls Prevention: - Provides verbal and written material to individual with discussion of falls prevention and safety.  Cardiac Rehab from 02/23/2015 in [facility] Cardiac Rehab   Date  02/23/15   Educator  C. Escher Harr,RN   Instruction Review Code  2- meets goals/outcomes      Diabetes: - Individual verbal and written instruction to review signs/symptoms of diabetes, desired ranges of glucose level fasting, after meals and with exercise. Advice that pre and post exercise glucose checks will be done for 3 sessions at entry of program.    Knowledge Questionnaire Score:   Personal Goals and Risk Factors at Admission:     Personal Goals and Risk Factors at Admission - 02/23/15 1108    Personal Goals and Risk Factors on Admission   Increase Aerobic Exercise and Physical Activity Yes  Normal weight 192 but today after Lasix etc which wasw stopped 179lbs   Diabetes No      Personal Goals and Risk Factors Review:    Personal Goals Discharge (Final Personal Goals and Risk Factors Review):     Comments: [place] and his wife just want him to do the exercise portion. Have $40 copay each time and said Dr. Jobie Quaker said 3 times a week for one month. They do not feel they have any psych issues or need counseling and do not need nutrition info at age 34. Eating healthy already.

## 2015-02-25 ENCOUNTER — Other Ambulatory Visit: Payer: Self-pay | Admitting: *Deleted

## 2015-02-25 ENCOUNTER — Encounter: Payer: Self-pay | Admitting: Cardiothoracic Surgery

## 2015-02-25 ENCOUNTER — Ambulatory Visit
Admission: RE | Admit: 2015-02-25 | Discharge: 2015-02-25 | Disposition: A | Discharge status: Home / Self Care | Payer: Medicare Other | Source: Ambulatory Visit | Attending: Cardiothoracic Surgery | Admitting: Cardiothoracic Surgery

## 2015-02-25 ENCOUNTER — Ambulatory Visit (INDEPENDENT_AMBULATORY_CARE_PROVIDER_SITE_OTHER): Payer: Self-pay | Admitting: Cardiothoracic Surgery

## 2015-02-25 VITALS — BP 129/89 | HR 72 | Resp 18 | Ht 73.0 in | Wt 182.0 lb

## 2015-02-25 DIAGNOSIS — J90 Pleural effusion, not elsewhere classified: Secondary | ICD-10-CM

## 2015-02-25 DIAGNOSIS — J948 Other specified pleural conditions: Secondary | ICD-10-CM

## 2015-02-25 DIAGNOSIS — Z951 Presence of aortocoronary bypass graft: Secondary | ICD-10-CM

## 2015-02-25 DIAGNOSIS — I2510 Atherosclerotic heart disease of native coronary artery without angina pectoris: Secondary | ICD-10-CM

## 2015-02-25 NOTE — Progress Notes (Signed)
Jacob Reyes       [place], [address]             [phone]      Jacob Reyes [place] Medical Record #[id number] Date of Birth: 1925/11/04  Referring: Jacob Spray, MD Primary Care: Jacob Aus., MD  Chief Complaint:   POST OP FOLLOW UP 12/28/2014 OPERATIVE REPORT PREOPERATIVE DIAGNOSIS: Severe 3-vessel coronary artery disease. POSTOPERATIVE DIAGNOSIS: Severe 3-vessel coronary artery disease. SURGICAL PROCEDURE: Coronary artery bypass grafting x6, with left internal mammary to the left anterior descending coronary artery in 2 sites, in the mid vessel and the distal LAD, reverse saphenous vein graft to the diagonal, sequential reverse saphenous vein graft to the first and second obtuse marginal, and reverse saphenous vein graft to the mid posterior descending coronary artery, with right leg greater saphenous vein harvesting from the thigh and calf. SURGEON: Jacob Reyes, M.D.  History of Present Illness:     Patient making reasonable progression postop. He's now back at home from skilled nursing facility and is to start in cardiac rehabilitation next week. Patient unaware of any recurrent AFIB. He had significant fluid retention postoperatively especially in his lower extremities this is improved and because of generalized weakness his Lasix was discontinued, . He is increasing his activity.     Past Medical History  Diagnosis Date  . Glaucoma   . Hearing loss   . Reflux   . GERD (gastroesophageal reflux disease)   . Cancer [facility])     prostate  . Colon cancer ([place])   . Coronary artery disease   . Hypertension   . Abnormal stress test   . Depression   . Anxiety     just started med. for anxiety   . History of hiatal hernia      History  Smoking status  . Never Smoker   Smokeless tobacco  . Never Used    History  Alcohol Use No     No Known Allergies  Current Outpatient Prescriptions  Medication Sig Dispense  Refill  . apixaban (ELIQUIS) 5 MG TABS tablet Take 1 tablet (5 mg total) by mouth every 12 (twelve) hours. 60 tablet   . aspirin EC 81 MG tablet Take 81 mg by mouth daily.    . brimonidine-timolol (COMBIGAN) 0.2-0.5 % ophthalmic solution Place 1 drop into both eyes 2 (two) times daily.    . cholecalciferol (VITAMIN D) 1000 UNITS tablet Take 1,000 Units by mouth daily.    . finasteride (PROSCAR) 5 MG tablet Take 5 mg by mouth daily after supper.     . levothyroxine (SYNTHROID, LEVOTHROID) 75 MCG tablet Take 75 mcg by mouth daily before breakfast.     . lovastatin (MEVACOR) 40 MG tablet Take 40 mg by mouth daily after supper.     Marland Kitchen LUMIGAN 0.01 % SOLN Place 1 drop into both eyes at bedtime.     . magnesium chloride (SLOW-MAG) 64 MG TBEC SR tablet Take 2 tablets (128 mg total) by mouth daily. 60 tablet   . Melatonin 5 MG TABS Take 5 mg by mouth at bedtime as needed (sleep).    Marland Kitchen omeprazole (PRILOSEC) 20 MG capsule Take 20 mg by mouth daily before breakfast.     . polyethylene glycol powder (GLYCOLAX/MIRALAX) powder Take 17 g by mouth daily.     . tamsulosin (FLOMAX) 0.4 MG CAPS capsule Take 0.4 mg by mouth 2 (two) times daily.      No  current facility-administered medications for this visit.       Physical Exam: BP 129/89 mmHg  Pulse 72  Resp 18  Ht 6\' 1"  (1.854 m)  Wt 182 lb (82.555 kg)  BMI 24.02 kg/m2  SpO2 98%  General appearance: alert, cooperative, appears stated age and no distress Neurologic: intact Heart: regular rate and rhythm, S1, S2 normal, no murmur, click, rub or gallop Lungs: diminished breath sounds LLL Abdomen: soft, non-tender; bowel sounds normal; no masses,  no organomegaly Extremities: extremities normal, atraumatic, no cyanosis or edema Wound: sternum satble healing well, still with bilaterial pedal edema, right thigh healing well but still with right thigh swelling the lower extremity edema is markedly improved   Diagnostic Studies & Laboratory data:      Recent Radiology Findings:   Dg Chest 2 View  02/25/2015   CLINICAL DATA:  Status post CABG on December 28, 2014, persistent chest discomfort in the midline near the sternal notch with no other complaints, history of prostate and colonic malignancy.  EXAM: CHEST  2 VIEW  COMPARISON:  PA and lateral chest x-ray of February 11, 2015  FINDINGS: The moderate-sized left pleural effusion persists but has decreased in volume. There is no pneumothorax or pneumomediastinum. Is minimal atelectasis at the left base. The right lung is clear. The heart and pulmonary vascularity are normal. There are 7 intact sternal wires. The mediastinum is normal in width. The retrosternal soft tissues are normal. There is moderate dextrocurvature of the mid thoracic spine which is stable.  IMPRESSION: Decreasing volume of the moderate size left pleural effusion. Persistent mild left basilar atelectasis. There is no evidence of CHF.   Electronically Signed   By: David  Martinique M.D.   On: 02/25/2015 09:32      Recent Lab Findings: Lab Results  Component Value Date   WBC 8.4 01/10/2015   HGB 9.7* 01/10/2015   HCT 29.5* 01/10/2015   PLT 398 01/10/2015   GLUCOSE 89 01/10/2015   ALT 10* 12/31/2014   AST 19 12/31/2014   NA 134* 01/10/2015   K 4.4 01/10/2015   CL 101 01/10/2015   CREATININE 0.88 01/10/2015   BUN 16 01/10/2015   CO2 27 01/10/2015   TSH 3.385 12/30/2014   INR 1.85* 12/28/2014   HGBA1C 5.7* 12/25/2014      Assessment / Plan:   progressing following cabg to start cardiac rehabilitation next week   persistent Left pleural effusion present, and not tolerating diarrhetic   We'll plan to proceed with ultrasound-guided left thoracentesis to drain the left chest rise , we'll hold his request prior to thoracentesis . encouraged to start cardiac rehab soon return4  Weeks with chest x-ray    [name] MD      [address] [place] [address] Office [phone]   Beeper  [phone]  02/25/2015 10:17 AM

## 2015-03-01 DIAGNOSIS — Z951 Presence of aortocoronary bypass graft: Secondary | ICD-10-CM | POA: Diagnosis not present

## 2015-03-01 NOTE — Progress Notes (Signed)
Daily Session Note  Patient Details  Name: Jacob Reyes MRN: [id number] Date of Birth: Mar 15, 1926 Referring Provider:  Teodoro Spray, MD  Encounter Date: 03/01/2015  Check In:     Session Check In - 03/01/15 1636    Check-In   Staff Present Lestine Box BS, ACSM EP-C, Exercise Physiologist;Carroll Enterkin RN, BSN;Other   ER physicians immediately available to respond to emergencies See telemetry face sheet for immediately available ER MD   Medication changes reported     No   Fall or balance concerns reported    No   Warm-up and Cool-down Performed on first and last piece of equipment   VAD Patient? No   Pain Assessment   Currently in Pain? No/denies         Goals Met:  Independence with exercise equipment Exercise tolerated well No report of cardiac concerns or symptoms Strength training completed today  Goals Unmet:  Not Applicable  Goals Comments:    Dr. Emily Filbert is Medical Director for [place] and [facility].

## 2015-03-02 ENCOUNTER — Encounter: Payer: Self-pay | Admitting: Sports Medicine

## 2015-03-02 ENCOUNTER — Ambulatory Visit: Payer: Medicare Other

## 2015-03-02 ENCOUNTER — Ambulatory Visit (INDEPENDENT_AMBULATORY_CARE_PROVIDER_SITE_OTHER): Payer: Medicare Other | Admitting: Sports Medicine

## 2015-03-02 DIAGNOSIS — R600 Localized edema: Secondary | ICD-10-CM

## 2015-03-02 DIAGNOSIS — Q828 Other specified congenital malformations of skin: Secondary | ICD-10-CM | POA: Diagnosis not present

## 2015-03-02 DIAGNOSIS — B351 Tinea unguium: Secondary | ICD-10-CM

## 2015-03-02 DIAGNOSIS — I739 Peripheral vascular disease, unspecified: Secondary | ICD-10-CM

## 2015-03-02 DIAGNOSIS — M79673 Pain in unspecified foot: Secondary | ICD-10-CM

## 2015-03-02 NOTE — Progress Notes (Signed)
Patient ID: Jacob Reyes, male   DOB: 1925/11/05, 81 y.o.   MRN: [id number] Subjective: Jacob Reyes is a 81 y.o. male patient seen today in office with complaint of thickened and elongated toenails and callus sub 2 right foot. Patient denies history of Diabetes or Neuropathy. Has a history of Cardiovascular disease status post 4 months open heart surgery currently in rehab. + LE edema controlled with compression/TED stockings. Patient has no other pedal complaints at this time.   Patient Active Problem List   Diagnosis Date Noted  . Anticoagulated-Eliquis added post CABG 01/06/2015  . Atrial fibrillation with RVR post op 01/02/2015  . Dyslipidemia 01/02/2015  . Hypotension post op 01/02/2015  . GERD (gastroesophageal reflux disease) 01/02/2015  . S/P CABG x 6 12/28/2014  . Coronary artery disease 12/10/2014  . Angina, class III ([place]) 12/10/2014  . Combined fat and carbohydrate induced hyperlipemia 05/25/2014  . Cancer of colon- remote surgery 11/16/2013   Current Outpatient Prescriptions on File Prior to Visit  Medication Sig Dispense Refill  . apixaban (ELIQUIS) 5 MG TABS tablet Take 1 tablet (5 mg total) by mouth every 12 (twelve) hours. 60 tablet   . aspirin EC 81 MG tablet Take 81 mg by mouth daily.    . brimonidine-timolol (COMBIGAN) 0.2-0.5 % ophthalmic solution Place 1 drop into both eyes 2 (two) times daily.    . cholecalciferol (VITAMIN D) 1000 UNITS tablet Take 1,000 Units by mouth daily.    . finasteride (PROSCAR) 5 MG tablet Take 5 mg by mouth daily after supper.     . levothyroxine (SYNTHROID, LEVOTHROID) 75 MCG tablet Take 75 mcg by mouth daily before breakfast.     . lovastatin (MEVACOR) 40 MG tablet Take 40 mg by mouth daily after supper.     Marland Kitchen LUMIGAN 0.01 % SOLN Place 1 drop into both eyes at bedtime.     . magnesium chloride (SLOW-MAG) 64 MG TBEC SR tablet Take 2 tablets (128 mg total) by mouth daily. 60 tablet   . Melatonin 5 MG TABS Take 5 mg by mouth at  bedtime as needed (sleep).    Marland Kitchen omeprazole (PRILOSEC) 20 MG capsule Take 20 mg by mouth daily before breakfast.     . polyethylene glycol powder (GLYCOLAX/MIRALAX) powder Take 17 g by mouth daily.     . tamsulosin (FLOMAX) 0.4 MG CAPS capsule Take 0.4 mg by mouth 2 (two) times daily.      No current facility-administered medications on file prior to visit.   No Known Allergies   Objective: Physical Exam  General: Well developed, nourished, no acute distress, awake, alert and oriented x 3  Vascular: Dorsalis pedis artery 1/4 bilateral, Posterior tibial artery 0/4 bilateral, skin temperature warm to warm proximal to distal bilateral lower extremities, mild varicosities, scant hair present bilateral. 1+ pitting edema bilateral lower extremities currently controlled with TED/compression stockings.   Neurological: Gross sensation present via light touch bilateral   Dermatological: Skin is warm, dry, and supple bilateral, Nails 1-9 are tender, long, thick, and discolored with mild subungal debris, no webspace macerations present bilateral, no open lesions present bilateral, callus/hyperkeratotic tissue present sub 2, right foot with no signs of infection.  Musculoskeletal: Significant hammertoe and bunion crossover deformity bilateral. Hx of toe amputation on right foot. Metatarsal heads palpable on right with fat pad migration noted. Muscular strength within normal limits without pain or limitation on range of motion. No pain with calf compression bilateral.  Assessment and Plan:  Problem List  Items Addressed This Visit    None    Visit Diagnoses    Dermatophytosis of nail    -  Primary    Foot pain, unspecified laterality        Porokeratosis        Localized edema        PVD (peripheral vascular disease) ([place])          -Examined patient  -Discussed treatment options for painful mycotic nails and callus skin. -Mechanically debrided and reduced mycotic nails with sterile nail nipper  and dremel nail file without incident. - Debrided callus sub 2 right foot using sterile chisel blade without incident and applied offloading padding - Recommend continue with TED/compression stockings -Continue with good supportive shoes and padding/offloading sub 2 right foot -Patient to return in 3 months for follow up evaluation or sooner if symptoms worsen.  Landis Martins, DPM

## 2015-03-03 DIAGNOSIS — Z951 Presence of aortocoronary bypass graft: Secondary | ICD-10-CM

## 2015-03-03 NOTE — Addendum Note (Signed)
Addended by: Gerlene Burdock on: 03/03/2015 06:38 PM   Modules accepted: Orders

## 2015-03-03 NOTE — Progress Notes (Signed)
Daily Session Note  Patient Details  Name: Jacob Reyes MRN: [id number] Date of Birth: 1926/01/28 Referring Provider:  Teodoro Spray, MD  Encounter Date: 03/03/2015  Check In:     Session Check In - 03/03/15 1821    Check-In   Staff Present Candiss Norse MS, ACSM CEP Exercise Physiologist;Carroll Enterkin RN, BSN;Nolon Yellin BS, ACSM EP-C, Exercise Physiologist   ER physicians immediately available to respond to emergencies See telemetry face sheet for immediately available ER MD   Medication changes reported     No   Fall or balance concerns reported    No   Warm-up and Cool-down Performed on first and last piece of equipment   VAD Patient? No   Pain Assessment   Currently in Pain? No/denies         Goals Met:  Proper associated with RPD/PD & O2 Sat Exercise tolerated well No report of cardiac concerns or symptoms Strength training completed today  Goals Unmet:  Not Applicable  Goals Comments:    Dr. Emily Filbert is Medical Director for [facility] and [facility].

## 2015-03-03 NOTE — Progress Notes (Signed)
Cardiac Individual Treatment Plan  Patient Details  Name: Jacob Reyes MRN: [id number] Date of Birth: June 16, 1925 Referring Provider:  Teodoro Spray, MD  Initial Encounter Date:    Visit Diagnosis: S/P CABG x 6  Patient's Home Medications on Admission:  Current outpatient prescriptions:  .  apixaban (ELIQUIS) 5 MG TABS tablet, Take 1 tablet (5 mg total) by mouth every 12 (twelve) hours., Disp: 60 tablet, Rfl:  .  aspirin EC 81 MG tablet, Take 81 mg by mouth daily., Disp: , Rfl:  .  brimonidine-timolol (COMBIGAN) 0.2-0.5 % ophthalmic solution, Place 1 drop into both eyes 2 (two) times daily., Disp: , Rfl:  .  cholecalciferol (VITAMIN D) 1000 UNITS tablet, Take 1,000 Units by mouth daily., Disp: , Rfl:  .  finasteride (PROSCAR) 5 MG tablet, Take 5 mg by mouth daily after supper. , Disp: , Rfl:  .  levothyroxine (SYNTHROID, LEVOTHROID) 75 MCG tablet, Take 75 mcg by mouth daily before breakfast. , Disp: , Rfl:  .  lovastatin (MEVACOR) 40 MG tablet, Take 40 mg by mouth daily after supper. , Disp: , Rfl:  .  LUMIGAN 0.01 % SOLN, Place 1 drop into both eyes at bedtime. , Disp: , Rfl:  .  magnesium chloride (SLOW-MAG) 64 MG TBEC SR tablet, Take 2 tablets (128 mg total) by mouth daily., Disp: 60 tablet, Rfl:  .  Melatonin 5 MG TABS, Take 5 mg by mouth at bedtime as needed (sleep)., Disp: , Rfl:  .  omeprazole (PRILOSEC) 20 MG capsule, Take 20 mg by mouth daily before breakfast. , Disp: , Rfl:  .  polyethylene glycol powder (GLYCOLAX/MIRALAX) powder, Take 17 g by mouth daily. , Disp: , Rfl:  .  tamsulosin (FLOMAX) 0.4 MG CAPS capsule, Take 0.4 mg by mouth 2 (two) times daily. , Disp: , Rfl:   Past Medical History: Past Medical History  Diagnosis Date  . Glaucoma   . Hearing loss   . Reflux   . GERD (gastroesophageal reflux disease)   . Cancer [facility])     prostate  . Colon cancer ([place])   . Coronary artery disease   . Hypertension   . Abnormal stress test   . Depression   . Anxiety      just started med. for anxiety   . History of hiatal hernia     Tobacco Use: History  Smoking status  . Never Smoker   Smokeless tobacco  . Never Used    Labs: Recent Review Flowsheet Data    Labs for ITP Cardiac and Pulmonary Rehab Latest Ref Rng 12/28/2014 12/28/2014 12/29/2014 12/29/2014 12/29/2014   PHART 7.350 - 7.450 - 7.452(H) 7.411 7.418 -   PCO2ART 35.0 - 45.0 mmHg - 35.2 32.5(L) 31.2(L) -   HCO3 20.0 - 24.0 mEq/L - 24.5(H) 20.7 20.2 -   TCO2 0 - 100 mmol/L 21 26 22 21 20    ACIDBASEDEF 0.0 - 2.0 mmol/L - - 4.0(H) 4.0(H) -   O2SAT - - 99.0 96.0 93.0 -       Exercise Target Goals:    Exercise Program Goal: Individual exercise prescription set with THRR, safety & activity barriers. Participant demonstrates ability to understand and report RPE using BORG scale, to self-measure pulse accurately, and to acknowledge the importance of the exercise prescription.  Exercise Prescription Goal: Starting with aerobic activity 30 plus minutes a day, 3 days per week for initial exercise prescription. Provide home exercise prescription and guidelines that participant acknowledges understanding prior to discharge.  Activity Barriers & Risk Stratification:     Activity Barriers & Risk Stratification - 02/23/15 1105    Activity Barriers & Risk Stratification   Activity Barriers None   Risk Stratification High      6 Minute Walk:     6 Minute Walk      02/23/15 1438       6 Minute Walk   Phase Initial     Distance 650 feet     Walk Time 6 minutes     Resting HR 84 bpm     Resting BP 110/70 mmHg     Max Ex. HR 110 bpm     Max Ex. BP 132/62 mmHg     RPE 12     Symptoms No        Initial Exercise Prescription:     Initial Exercise Prescription - 02/23/15 1400    Date of Initial Exercise Prescription   Date 02/23/15   Treadmill   MPH 1.2   Grade 0   Minutes 10  Use intervals to complete time goals if necessary   Bike   Level 0.2   Minutes 10   Recumbant Bike    Level 3   RPM 40   Watts 25   Minutes 15   NuStep   Level 2   Watts 30   Minutes 15   Arm Ergometer   Level 1   Watts 8   Minutes 10   Arm/Foot Ergometer   Level 4   Watts 15   Minutes 10   Cybex   Level 1   RPM 50   Minutes 10   Recumbant Elliptical   Level 1   RPM 40   Watts 10   Minutes 10   Elliptical   Level 1   Speed 3   Minutes 1   REL-XR   Level 2   Watts 30   Minutes 15   Prescription Details   Frequency (times per week) 3   Duration Progress to 30 minutes of continuous aerobic without signs/symptoms of physical distress   Intensity   THRR REST +  30   Ratings of Perceived Exertion 11-15   Progression Continue progressive overload as per policy without signs/symptoms or physical distress.   Resistance Training   Training Prescription Yes   Weight 2   Reps 10-15      Exercise Prescription Changes:   Discharge Exercise Prescription (Final Exercise Prescription Changes):   Nutrition:  Target Goals: Understanding of nutrition guidelines, daily intake of sodium 1500mg , cholesterol 200mg , calories 30% from fat and 7% or less from saturated fats, daily to have 5 or more servings of fruits and vegetables.  Biometrics:     Pre Biometrics - 02/23/15 1437    Pre Biometrics   Height 6' 0.2" (1.834 m)   Weight 185 lb 12.8 oz (84.278 kg)   Waist Circumference 40.5 inches   Hip Circumference 44 inches   Waist to Hip Ratio 0.92 %   BMI (Calculated) 25.1       Nutrition Therapy Plan and Nutrition Goals:     Nutrition Therapy & Goals - 02/26/15 1124    Nutrition Therapy   Diet Patient declined MNT at this time.       Nutrition Discharge: Rate Your Plate Scores:     Rate Your Plate - 25/42/70 6237    Rate Your Plate Scores   Pre Score --  Not done by patient age 67  Nutrition Goals Re-Evaluation:   Psychosocial: Target Goals: Acknowledge presence or absence of depression, maximize coping skills, provide positive support system.  Participant is able to verbalize types and ability to use techniques and skills needed for reducing stress and depression.  Initial Review & Psychosocial Screening:     Initial Psych Review & Screening - 02/23/15 1109    Initial Review   Current issues with Current Sleep Concerns   Family Dynamics   Good Support System? Yes   Comments Discussed maybe sleeping in recliner since his upper chest has some burning where he removed an artery to use. Dr. Jobie Quaker told them some people have that and sit in waiting room with their shirts open to not touch that site. (patient reports)      Quality of Life Scores:     Quality of Life - 02/23/15 1506    Quality of Life Scores   Health/Function Pre --  Not done by patient. Jacob Reyes said he is 82 and really doesn't have any major problems at his age and his wife agreed.       PHQ-9:     Recent Review Flowsheet Data    Depression screen [facility] [place] 2/9 02/23/2015   Decreased Interest 0   Down, Depressed, Hopeless 0   PHQ - 2 Score 0   Altered sleeping 3   Tired, decreased energy 0   Change in appetite 0   Feeling bad or failure about yourself  0   Trouble concentrating 0   Moving slowly or fidgety/restless 0   Suicidal thoughts 0   PHQ-9 Score 3      Psychosocial Evaluation and Intervention:   Psychosocial Re-Evaluation:   Vocational Rehabilitation: Provide vocational rehab assistance to qualifying candidates.   Vocational Rehab Evaluation & Intervention:     Vocational Rehab - 02/23/15 1107    Initial Vocational Rehab Evaluation & Intervention   Assessment shows need for Vocational Rehabilitation No      Education: Education Goals: Education classes will be provided on a weekly basis, covering required topics. Participant will state understanding/return demonstration of topics presented.  Learning Barriers/Preferences:     Learning Barriers/Preferences - 02/23/15 1503    Learning Barriers/Preferences   Learning Barriers  Hearing      Education Topics: General Nutrition Guidelines/Fats and Fiber: -Group instruction provided by verbal, written material, models and posters to present the general guidelines for heart healthy nutrition. Gives an explanation and review of dietary fats and fiber.          Cardiac Rehab from 03/01/2015 in [facility]   Date  03/01/15   Educator  PI   Instruction Review Code  2- meets goals/outcomes      Controlling Sodium/Reading Food Labels: -Group verbal and written material supporting the discussion of sodium use in heart healthy nutrition. Review and explanation with models, verbal and written materials for utilization of the food label.   Exercise Physiology & Risk Factors: - Group verbal and written instruction with models to review the exercise physiology of the cardiovascular system and associated critical values. Details cardiovascular disease risk factors and the goals associated with each risk factor.   Aerobic Exercise & Resistance Training: - Gives group verbal and written discussion on the health impact of inactivity. On the components of aerobic and resistive training programs and the benefits of this training and how to safely progress through these programs.   Flexibility, Balance, General Exercise Guidelines: - Provides group verbal and written instruction on the benefits of  flexibility and balance training programs. Provides general exercise guidelines with specific guidelines to those with heart or lung disease. Demonstration and skill practice provided.   Stress Management: - Provides group verbal and written instruction about the health risks of elevated stress, cause of high stress, and healthy ways to reduce stress.   Depression: - Provides group verbal and written instruction on the correlation between heart/lung disease and depressed mood, treatment options, and the stigmas associated with seeking treatment.   Anatomy & Physiology of  the Heart: - Group verbal and written instruction and models provide basic cardiac anatomy and physiology, with the coronary electrical and arterial systems. Review of: AMI, Angina, Valve disease, Heart Failure, Cardiac Arrhythmia, Pacemakers, and the ICD.   Cardiac Procedures: - Group verbal and written instruction and models to describe the testing methods done to diagnose heart disease. Reviews the outcomes of the test results. Describes the treatment choices: Medical Management, Angioplasty, or Coronary Bypass Surgery.   Cardiac Medications: - Group verbal and written instruction to review commonly prescribed medications for heart disease. Reviews the medication, class of the drug, and side effects. Includes the steps to properly store meds and maintain the prescription regimen.   Go Sex-Intimacy & Heart Disease, Get SMART - Goal Setting: - Group verbal and written instruction through game format to discuss heart disease and the return to sexual intimacy. Provides group verbal and written material to discuss and apply goal setting through the application of the S.M.A.R.T. Method.   Other Matters of the Heart: - Provides group verbal, written materials and models to describe Heart Failure, Angina, Valve Disease, and Diabetes in the realm of heart disease. Includes description of the disease process and treatment options available to the cardiac patient.   Exercise & Equipment Safety: - Individual verbal instruction and demonstration of equipment use and safety with use of the equipment.      Cardiac Rehab from 03/01/2015 in [facility]iac [facility]   Date  02/23/15   Educator  C. Estefano Victory,RN   Instruction Review Code  1- partially meets, needs review/practice      Infection Prevention: - Provides verbal and written material to individual with discussion of infection control including proper hand washing and proper equipment cleaning during exercise session.      Cardiac Rehab from  03/01/2015 in [facility] Cardiac Rehab   Date  02/23/15   Educator  C. Zaley Talley,RN   Instruction Review Code  2- meets goals/outcomes      Falls Prevention: - Provides verbal and written material to individual with discussion of falls prevention and safety.      Cardiac Rehab from 03/01/2015 in [facility] Card[facility] Re[facility]   Date  02/23/15   Educator  C. Yonas Bunda,RN   Instruction Review Code  2- meets goals/outcomes      Diabetes: - Individual verbal and written instruction to review signs/symptoms of diabetes, desired ranges of glucose level fasting, after meals and with exercise. Advice that pre and post exercise glucose checks will be done for 3 sessions at entry of program.    Knowledge Questionnaire Score:     Knowledge Questionnaire Score - 02/23/15 1503    Knowledge Questionnaire Score   Pre Score Not done by patient      Personal Goals and Risk Factors at Admission:     Personal Goals and Risk Factors at Admission - 02/24/15 1410    Personal Goals and Risk Factors on Admission    Weight Management No   Intervention Learn and follow the  exercise and diet guidelines while in the program. Utilize the nutrition and education classes to help gain knowledge of the diet and exercise expectations in the program   Admit Weight 178 lb (80.74 kg)  Jacob Reyes reported Dr. Sabra Heck stopped his fluid pills since his weight was down 12 lbs and sodium was too low.    Goal Weight 190 lb (86.183 kg)      Personal Goals and Risk Factors Review:      Goals and Risk Factor Review      03/03/15 1837           Increase Aerobic Exercise and Physical Activity   Goals Progress/Improvement seen  Yes          Personal Goals Discharge (Final Personal Goals and Risk Factors Review):      Goals and Risk Factor Review - 03/03/15 1837    Increase Aerobic Exercise and Physical Activity   Goals Progress/Improvement seen  Yes       Comments: Jacob Reyes says Cardiac Rehab is helpful.

## 2015-03-04 DIAGNOSIS — Z951 Presence of aortocoronary bypass graft: Secondary | ICD-10-CM

## 2015-03-04 NOTE — Progress Notes (Signed)
Daily Session Note  Patient Details  Name: JACEION ADAY MRN: [id number] Date of Birth: 1925/08/25 Referring Provider:  Teodoro Spray, MD  Encounter Date: 03/04/2015  Check In:     Session Check In - 03/04/15 1745    Check-In   Staff Present Gerlene Burdock RN, BSN;Finnick Orosz BS, ACSM EP-C, Exercise Physiologist;Diane Mariana Arn, BSN   ER physicians immediately available to respond to emergencies See telemetry face sheet for immediately available ER MD   Medication changes reported     No   Fall or balance concerns reported    No   Warm-up and Cool-down Performed on first and last piece of equipment   VAD Patient? No   Pain Assessment   Currently in Pain? No/denies         Goals Met:  Proper associated with RPD/PD & O2 Sat Exercise tolerated well No report of cardiac concerns or symptoms Strength training completed today  Goals Unmet:  Not Applicable  Goals Comments:    Dr. Emily Filbert is Medical Director for [facility] and [facility].

## 2015-03-08 ENCOUNTER — Ambulatory Visit (HOSPITAL_COMMUNITY)
Admission: RE | Admit: 2015-03-08 | Discharge: 2015-03-08 | Disposition: A | Discharge status: Home / Self Care | Payer: Medicare Other | Source: Ambulatory Visit | Attending: Physician Assistant | Admitting: Physician Assistant

## 2015-03-08 ENCOUNTER — Ambulatory Visit (HOSPITAL_COMMUNITY)
Admission: RE | Admit: 2015-03-08 | Discharge: 2015-03-08 | Disposition: A | Discharge status: Home / Self Care | Payer: Medicare Other | Source: Ambulatory Visit | Attending: Cardiothoracic Surgery | Admitting: Cardiothoracic Surgery

## 2015-03-08 DIAGNOSIS — J90 Pleural effusion, not elsewhere classified: Secondary | ICD-10-CM | POA: Insufficient documentation

## 2015-03-08 LAB — GRAM STAIN

## 2015-03-08 MED ORDER — SODIUM CHLORIDE 0.9 % IV BOLUS (SEPSIS)
500.0000 mL | Freq: Once | INTRAVENOUS | Status: AC
  Administered 2015-03-08: 500 mL via INTRAVENOUS

## 2015-03-08 MED ORDER — LIDOCAINE HCL (PF) 1 % IJ SOLN
INTRAMUSCULAR | Status: AC
  Filled 2015-03-08: qty 10

## 2015-03-08 NOTE — Procedures (Signed)
Successful US guided left thoracentesis. Yielded 1.1 liters of blood tinged fluid. Pt tolerated procedure well. No immediate complications.  Specimen was sent for labs. CXR ordered.  WENDY S BLAIR PA-C 03/08/2015 10:16 AM

## 2015-03-08 NOTE — Progress Notes (Signed)
Pt alert and oriented x4, pt discharged home with family after receiving fluids. Pt denies any pain or discomfort

## 2015-03-08 NOTE — Progress Notes (Signed)
Pt being watched at RN station and given IV fluid bolus per Gareth Eagle, PA, post para.

## 2015-03-08 NOTE — Progress Notes (Signed)
RN in room ta assist with pt care. BP low during paracentesis; 02 level WNL, HR WNL, will monitor. Post-procedure pt placed in trendelenberg position to aid BP increase. Pt c/o some dizziness

## 2015-03-08 NOTE — Progress Notes (Signed)
PA in room ro assess pt staus. Fluid bolus infusing. BP improved. Pt color improved, eating crackers. Denies dizziness.  Will check orthostatics before D/C home with family

## 2015-03-10 ENCOUNTER — Encounter: Payer: Medicare Other | Admitting: *Deleted

## 2015-03-10 DIAGNOSIS — Z951 Presence of aortocoronary bypass graft: Secondary | ICD-10-CM | POA: Diagnosis not present

## 2015-03-10 NOTE — Progress Notes (Signed)
Daily Session Note  Patient Details  Name: Jacob Reyes MRN: [id number] Date of Birth: 11/13/1925 Referring Provider:  Fath, Kenneth A, MD  Encounter Date: 03/10/2015  Check In:     Session Check In - 03/10/15 1658    Check-In   Staff Present Carroll Enterkin RN, BSN;Renee MacMillan MS, ACSM CEP Exercise Physiologist;Diane Wright RN, BSN   ER physicians immediately available to respond to emergencies See telemetry face sheet for immediately available ER MD   Medication changes reported     No   Fall or balance concerns reported    No   Warm-up and Cool-down Performed on first and last piece of equipment   VAD Patient? No   Pain Assessment   Currently in Pain? No/denies           Exercise Prescription Changes - 03/10/15 1600    Recumbant Elliptical   Level 4   Watts 25   Minutes 30      Goals Met:  Exercise tolerated well Personal goals reviewed No report of cardiac concerns or symptoms Strength training completed today  Goals Unmet:  Not Applicable  Goals Comments:  Wanted to stay on Biostep today.  Tolerated well.     Dr. Mark Miller is Medical Director for [facility] and [facility]. 

## 2015-03-11 ENCOUNTER — Encounter: Payer: Medicare Other | Admitting: *Deleted

## 2015-03-11 DIAGNOSIS — Z951 Presence of aortocoronary bypass graft: Secondary | ICD-10-CM

## 2015-03-11 NOTE — Progress Notes (Signed)
Cardiac Individual Treatment Plan  Patient Details  Name: Jacob Reyes MRN: [id number] Date of Birth: 04/02/1926 Referring Provider:  Teodoro Spray, MD  Initial Encounter Date:    Visit Diagnosis: S/P CABG x 6  Patient's Home Medications on Admission:  Current outpatient prescriptions:  .  apixaban (ELIQUIS) 5 MG TABS tablet, Take 1 tablet (5 mg total) by mouth every 12 (twelve) hours., Disp: 60 tablet, Rfl:  .  aspirin EC 81 MG tablet, Take 81 mg by mouth daily., Disp: , Rfl:  .  brimonidine-timolol (COMBIGAN) 0.2-0.5 % ophthalmic solution, Place 1 drop into both eyes 2 (two) times daily., Disp: , Rfl:  .  cholecalciferol (VITAMIN D) 1000 UNITS tablet, Take 1,000 Units by mouth daily., Disp: , Rfl:  .  finasteride (PROSCAR) 5 MG tablet, Take 5 mg by mouth daily after supper. , Disp: , Rfl:  .  levothyroxine (SYNTHROID, LEVOTHROID) 75 MCG tablet, Take 75 mcg by mouth daily before breakfast. , Disp: , Rfl:  .  lovastatin (MEVACOR) 40 MG tablet, Take 40 mg by mouth daily after supper. , Disp: , Rfl:  .  LUMIGAN 0.01 % SOLN, Place 1 drop into both eyes at bedtime. , Disp: , Rfl:  .  magnesium chloride (SLOW-MAG) 64 MG TBEC SR tablet, Take 2 tablets (128 mg total) by mouth daily., Disp: 60 tablet, Rfl:  .  Melatonin 5 MG TABS, Take 5 mg by mouth at bedtime as needed (sleep)., Disp: , Rfl:  .  omeprazole (PRILOSEC) 20 MG capsule, Take 20 mg by mouth daily before breakfast. , Disp: , Rfl:  .  polyethylene glycol powder (GLYCOLAX/MIRALAX) powder, Take 17 g by mouth daily. , Disp: , Rfl:  .  tamsulosin (FLOMAX) 0.4 MG CAPS capsule, Take 0.4 mg by mouth 2 (two) times daily. , Disp: , Rfl:   Past Medical History: Past Medical History  Diagnosis Date  . Glaucoma   . Hearing loss   . Reflux   . GERD (gastroesophageal reflux disease)   . Cancer [facility])     prostate  . Colon cancer ([place])   . Coronary artery disease   . Hypertension   . Abnormal stress test   . Depression   . Anxiety      just started med. for anxiety   . History of hiatal hernia     Tobacco Use: History  Smoking status  . Never Smoker   Smokeless tobacco  . Never Used    Labs: Recent Review Flowsheet Data    Labs for ITP Cardiac and Pulmonary Rehab Latest Ref Rng 12/28/2014 12/28/2014 12/29/2014 12/29/2014 12/29/2014   PHART 7.350 - 7.450 - 7.452(H) 7.411 7.418 -   PCO2ART 35.0 - 45.0 mmHg - 35.2 32.5(L) 31.2(L) -   HCO3 20.0 - 24.0 mEq/L - 24.5(H) 20.7 20.2 -   TCO2 0 - 100 mmol/L 21 26 22 21 20    ACIDBASEDEF 0.0 - 2.0 mmol/L - - 4.0(H) 4.0(H) -   O2SAT - - 99.0 96.0 93.0 -       Exercise Target Goals:    Exercise Program Goal: Individual exercise prescription set with THRR, safety & activity barriers. Participant demonstrates ability to understand and report RPE using BORG scale, to self-measure pulse accurately, and to acknowledge the importance of the exercise prescription.  Exercise Prescription Goal: Starting with aerobic activity 30 plus minutes a day, 3 days per week for initial exercise prescription. Provide home exercise prescription and guidelines that participant acknowledges understanding prior to discharge.  Activity Barriers & Risk Stratification:     Activity Barriers & Risk Stratification - 02/23/15 1105    Activity Barriers & Risk Stratification   Activity Barriers None   Risk Stratification High      6 Minute Walk:     6 Minute Walk      02/23/15 1438       6 Minute Walk   Phase Initial     Distance 650 feet     Walk Time 6 minutes     Resting HR 84 bpm     Resting BP 110/70 mmHg     Max Ex. HR 110 bpm     Max Ex. BP 132/62 mmHg     RPE 12     Symptoms No        Initial Exercise Prescription:     Initial Exercise Prescription - 02/23/15 1400    Date of Initial Exercise Prescription   Date 02/23/15   Treadmill   MPH 1.2   Grade 0   Minutes 10  Use intervals to complete time goals if necessary   Bike   Level 0.2   Minutes 10   Recumbant Bike    Level 3   RPM 40   Watts 25   Minutes 15   NuStep   Level 2   Watts 30   Minutes 15   Arm Ergometer   Level 1   Watts 8   Minutes 10   Arm/Foot Ergometer   Level 4   Watts 15   Minutes 10   Cybex   Level 1   RPM 50   Minutes 10   Recumbant Elliptical   Level 1   RPM 40   Watts 10   Minutes 10   Elliptical   Level 1   Speed 3   Minutes 1   REL-XR   Level 2   Watts 30   Minutes 15   Prescription Details   Frequency (times per week) 3   Duration Progress to 30 minutes of continuous aerobic without signs/symptoms of physical distress   Intensity   THRR REST +  30   Ratings of Perceived Exertion 11-15   Progression Continue progressive overload as per policy without signs/symptoms or physical distress.   Resistance Training   Training Prescription Yes   Weight 2   Reps 10-15      Exercise Prescription Changes:     Exercise Prescription Changes      03/10/15 1600           Recumbant Elliptical   Level 4       Watts 25       Minutes 30          Discharge Exercise Prescription (Final Exercise Prescription Changes):     Exercise Prescription Changes - 03/10/15 1600    Recumbant Elliptical   Level 4   Watts 25   Minutes 30      Nutrition:  Target Goals: Understanding of nutrition guidelines, daily intake of sodium 1500mg , cholesterol 200mg , calories 30% from fat and 7% or less from saturated fats, daily to have 5 or more servings of fruits and vegetables.  Biometrics:     Pre Biometrics - 02/23/15 1437    Pre Biometrics   Height 6' 0.2" (1.834 m)   Weight 185 lb 12.8 oz (84.278 kg)   Waist Circumference 40.5 inches   Hip Circumference 44 inches   Waist to Hip Ratio 0.92 %   BMI (Calculated)  25.1       Nutrition Therapy Plan and Nutrition Goals:     Nutrition Therapy & Goals - 02/26/15 1124    Nutrition Therapy   Diet Patient declined MNT at this time.       Nutrition Discharge: Rate Your Plate Scores:     Rate Your Plate  - 62/95/28 4132    Rate Your Plate Scores   Pre Score --  Not done by patient age 89      Nutrition Goals Re-Evaluation:     Nutrition Goals Re-Evaluation      03/11/15 1726           Personal Goal #1 Re-Evaluation   Personal Goal #1 Jacob Reyes cont to eat well.        Goal Progress Seen Yes          Psychosocial: Target Goals: Acknowledge presence or absence of depression, maximize coping skills, provide positive support system. Participant is able to verbalize types and ability to use techniques and skills needed for reducing stress and depression.  Initial Review & Psychosocial Screening:     Initial Psych Review & Screening - 02/23/15 1109    Initial Review   Current issues with Current Sleep Concerns   Family Dynamics   Good Support System? Yes   Comments Discussed maybe sleeping in recliner since his upper chest has some burning where he removed an artery to use. Jacob Reyes told them some people have that and sit in waiting room with their shirts open to not touch that site. (patient reports)      Quality of Life Scores:     Quality of Life - 02/23/15 1506    Quality of Life Scores   Health/Function Pre --  Not done by patient. Jacob Reyes said he is 48 and really doesn't have any major problems at his age and his wife agreed.       PHQ-9:     Recent Review Flowsheet Data    Depression screen [facility] 2/9 02/23/2015   Decreased Interest 0   Down, Depressed, Hopeless 0   PHQ - 2 Score 0   Altered sleeping 3   Tired, decreased energy 0   Change in appetite 0   Feeling bad or failure about yourself  0   Trouble concentrating 0   Moving slowly or fidgety/restless 0   Suicidal thoughts 0   PHQ-9 Score 3      Psychosocial Evaluation and Intervention:   Psychosocial Re-Evaluation:     Psychosocial Re-Evaluation      03/11/15 1728           Psychosocial Re-Evaluation   Interventions Encouraged to attend Cardiac Rehabilitation for the exercise        Comments Jacob Reyes has a $40 copay each time he attends Cardiac Rehab. He did not say he wanted to leave yet and he mentioned the $40 copay.        Continued Psychosocial Services Needed Yes          Vocational Rehabilitation: Provide vocational rehab assistance to qualifying candidates.   Vocational Rehab Evaluation & Intervention:     Vocational Rehab - 02/23/15 1107    Initial Vocational Rehab Evaluation & Intervention   Assessment shows need for Vocational Rehabilitation No      Education: Education Goals: Education classes will be provided on a weekly basis, covering required topics. Participant will state understanding/return demonstration of topics presented.  Learning Barriers/Preferences:     Learning Barriers/Preferences - 02/23/15 1503  Learning Barriers/Preferences   Learning Barriers Hearing      Education Topics: General Nutrition Guidelines/Fats and Fiber: -Group instruction provided by verbal, written material, models and posters to present the general guidelines for heart healthy nutrition. Gives an explanation and review of dietary fats and fiber.          Cardiac Rehab from 03/01/2015 in [facility] Cardiac Rehab   Date  03/01/15   Educator  PI   Instruction Review Code  2- meets goals/outcomes      Controlling Sodium/Reading Food Labels: -Group verbal and written material supporting the discussion of sodium use in heart healthy nutrition. Review and explanation with models, verbal and written materials for utilization of the food label.   Exercise Physiology & Risk Factors: - Group verbal and written instruction with models to review the exercise physiology of the cardiovascular system and associated critical values. Details cardiovascular disease risk factors and the goals associated with each risk factor.   Aerobic Exercise & Resistance Training: - Gives group verbal and written discussion on the health impact of inactivity. On the components of aerobic  and resistive training programs and the benefits of this training and how to safely progress through these programs.   Flexibility, Balance, General Exercise Guidelines: - Provides group verbal and written instruction on the benefits of flexibility and balance training programs. Provides general exercise guidelines with specific guidelines to those with heart or lung disease. Demonstration and skill practice provided.   Stress Management: - Provides group verbal and written instruction about the health risks of elevated stress, cause of high stress, and healthy ways to reduce stress.   Depression: - Provides group verbal and written instruction on the correlation between heart/lung disease and depressed mood, treatment options, and the stigmas associated with seeking treatment.   Anatomy & Physiology of the Heart: - Group verbal and written instruction and models provide basic cardiac anatomy and physiology, with the coronary electrical and arterial systems. Review of: AMI, Angina, Valve disease, Heart Failure, Cardiac Arrhythmia, Pacemakers, and the ICD.   Cardiac Procedures: - Group verbal and written instruction and models to describe the testing methods done to diagnose heart disease. Reviews the outcomes of the test results. Describes the treatment choices: Medical Management, Angioplasty, or Coronary Bypass Surgery.   Cardiac Medications: - Group verbal and written instruction to review commonly prescribed medications for heart disease. Reviews the medication, class of the drug, and side effects. Includes the steps to properly store meds and maintain the prescription regimen.   Go Sex-Intimacy & Heart Disease, Get SMART - Goal Setting: - Group verbal and written instruction through game format to discuss heart disease and the return to sexual intimacy. Provides group verbal and written material to discuss and apply goal setting through the application of the S.M.A.R.T.  Method.   Other Matters of the Heart: - Provides group verbal, written materials and models to describe Heart Failure, Angina, Valve Disease, and Diabetes in the realm of heart disease. Includes description of the disease process and treatment options available to the cardiac patient.   Exercise & Equipment Safety: - Individual verbal instruction and demonstration of equipment use and safety with use of the equipment.      Cardiac Rehab from 03/01/2015 in [facility] Cardiac Rehab   Date  02/23/15   Educator  C. Krysteena Stalker,RN   Instruction Review Code  1- partially meets, needs review/practice      Infection Prevention: - Provides verbal and written material to individual with discussion of infection  control including proper hand washing and proper equipment cleaning during exercise session.      Cardiac Rehab from 03/01/2015 in [facility] Cardiac Rehab   Date  02/23/15   Educator  C. Cambreigh Dearing,RN   Instruction Review Code  2- meets goals/outcomes      Falls Prevention: - Provides verbal and written material to individual with discussion of falls prevention and safety.      Cardiac Rehab from 03/01/2015 in [facility] Cardiac Rehab   Date  02/23/15   Educator  C. Roni Scow,RN   Instruction Review Code  2- meets goals/outcomes      Diabetes: - Individual verbal and written instruction to review signs/symptoms of diabetes, desired ranges of glucose level fasting, after meals and with exercise. Advice that pre and post exercise glucose checks will be done for 3 sessions at entry of program.    Knowledge Questionnaire Score:     Knowledge Questionnaire Score - 02/23/15 1503    Knowledge Questionnaire Score   Pre Score Not done by patient      Personal Goals and Risk Factors at Admission:     Personal Goals and Risk Factors at Admission - 02/24/15 1410    Personal Goals and Risk Factors on Admission    Weight Management No   Intervention Learn and follow the exercise and diet guidelines  while in the program. Utilize the nutrition and education classes to help gain knowledge of the diet and exercise expectations in the program   Admit Weight 178 lb (80.74 kg)  Jacob Reyes reported Jacob Reyes stopped his fluid pills since his weight was down 12 lbs and sodium was too low.    Goal Weight 190 lb (86.183 kg)      Personal Goals and Risk Factors Review:      Goals and Risk Factor Review      03/03/15 1837 03/11/15 1727         Increase Aerobic Exercise and Physical Activity   Goals Progress/Improvement seen  Yes Yes      Comments  Jacob Reyes feels he isn't getting strength like he would like to but Jacob Reyes said he did not like taking the Paxil that the doctor suggested. Jacob Reyes reports that he has another MD appt with Dr. Emily Filbert next week.          Personal Goals Discharge (Final Personal Goals and Risk Factors Review):      Goals and Risk Factor Review - 03/11/15 1727    Increase Aerobic Exercise and Physical Activity   Goals Progress/Improvement seen  Yes   Comments Jacob Reyes feels he isn't getting strength like he would like to but Jacob Reyes said he did not like taking the Paxil that the doctor suggested. Jacob Reyes reports that he has another MD appt with Dr. Emily Filbert next week.        Comments: Jacob Reyes took Benadyrl last night but said he felt too tired today.

## 2015-03-11 NOTE — Progress Notes (Signed)
Daily Session Note  Patient Details  Name: Jacob Reyes MRN: [id number] Date of Birth: 03-31-1926 Referring Provider:  Teodoro Spray, MD  Encounter Date: 03/11/2015  Check In:     Session Check In - 03/11/15 1644    Check-In   Staff Present Kyra Manges Abernethy RN;Carroll Enterkin RN, BSN;Presly Steinruck BS, ACSM EP-C, Exercise Physiologist   ER physicians immediately available to respond to emergencies See telemetry face sheet for immediately available ER MD   Medication changes reported     No   Fall or balance concerns reported    No   Warm-up and Cool-down Performed on first and last piece of equipment   VAD Patient? No   Pain Assessment   Currently in Pain? No/denies         Goals Met:  Proper associated with RPD/PD & O2 Sat Exercise tolerated well No report of cardiac concerns or symptoms Strength training completed today  Goals Unmet:  Not Applicable  Goals Comments:    Dr. Emily Filbert is Medical Director for [facility] and [facility].

## 2015-03-11 NOTE — Progress Notes (Signed)
Cardiac Individual Treatment Plan  Patient Details  Name: Jacob Reyes MRN: [id number] Date of Birth: 01/25/1926 Referring Provider:  Teodoro Spray, MD  Initial Encounter Date:    Visit Diagnosis: S/P CABG x 6  Patient's Home Medications on Admission:  Current outpatient prescriptions:  .  apixaban (ELIQUIS) 5 MG TABS tablet, Take 1 tablet (5 mg total) by mouth every 12 (twelve) hours., Disp: 60 tablet, Rfl:  .  aspirin EC 81 MG tablet, Take 81 mg by mouth daily., Disp: , Rfl:  .  brimonidine-timolol (COMBIGAN) 0.2-0.5 % ophthalmic solution, Place 1 drop into both eyes 2 (two) times daily., Disp: , Rfl:  .  cholecalciferol (VITAMIN D) 1000 UNITS tablet, Take 1,000 Units by mouth daily., Disp: , Rfl:  .  finasteride (PROSCAR) 5 MG tablet, Take 5 mg by mouth daily after supper. , Disp: , Rfl:  .  levothyroxine (SYNTHROID, LEVOTHROID) 75 MCG tablet, Take 75 mcg by mouth daily before breakfast. , Disp: , Rfl:  .  lovastatin (MEVACOR) 40 MG tablet, Take 40 mg by mouth daily after supper. , Disp: , Rfl:  .  LUMIGAN 0.01 % SOLN, Place 1 drop into both eyes at bedtime. , Disp: , Rfl:  .  magnesium chloride (SLOW-MAG) 64 MG TBEC SR tablet, Take 2 tablets (128 mg total) by mouth daily., Disp: 60 tablet, Rfl:  .  Melatonin 5 MG TABS, Take 5 mg by mouth at bedtime as needed (sleep)., Disp: , Rfl:  .  omeprazole (PRILOSEC) 20 MG capsule, Take 20 mg by mouth daily before breakfast. , Disp: , Rfl:  .  polyethylene glycol powder (GLYCOLAX/MIRALAX) powder, Take 17 g by mouth daily. , Disp: , Rfl:  .  tamsulosin (FLOMAX) 0.4 MG CAPS capsule, Take 0.4 mg by mouth 2 (two) times daily. , Disp: , Rfl:   Past Medical History: Past Medical History  Diagnosis Date  . Glaucoma   . Hearing loss   . Reflux   . GERD (gastroesophageal reflux disease)   . Cancer [facility])     prostate  . Colon cancer ([place])   . Coronary artery disease   . Hypertension   . Abnormal stress test   . Depression   . Anxiety      just started med. for anxiety   . History of hiatal hernia     Tobacco Use: History  Smoking status  . Never Smoker   Smokeless tobacco  . Never Used    Labs: Recent Review Flowsheet Data    Labs for ITP Cardiac and Pulmonary Rehab Latest Ref Rng 12/28/2014 12/28/2014 12/29/2014 12/29/2014 12/29/2014   PHART 7.350 - 7.450 - 7.452(H) 7.411 7.418 -   PCO2ART 35.0 - 45.0 mmHg - 35.2 32.5(L) 31.2(L) -   HCO3 20.0 - 24.0 mEq/L - 24.5(H) 20.7 20.2 -   TCO2 0 - 100 mmol/L 21 26 22 21 20    ACIDBASEDEF 0.0 - 2.0 mmol/L - - 4.0(H) 4.0(H) -   O2SAT - - 99.0 96.0 93.0 -       Exercise Target Goals:    Exercise Program Goal: Individual exercise prescription set with THRR, safety & activity barriers. Participant demonstrates ability to understand and report RPE using BORG scale, to self-measure pulse accurately, and to acknowledge the importance of the exercise prescription.  Exercise Prescription Goal: Starting with aerobic activity 30 plus minutes a day, 3 days per week for initial exercise prescription. Provide home exercise prescription and guidelines that participant acknowledges understanding prior to discharge.  Activity Barriers & Risk Stratification:     Activity Barriers & Risk Stratification - 02/23/15 1105    Activity Barriers & Risk Stratification   Activity Barriers None   Risk Stratification High      6 Minute Walk:     6 Minute Walk      02/23/15 1438       6 Minute Walk   Phase Initial     Distance 650 feet     Walk Time 6 minutes     Resting HR 84 bpm     Resting BP 110/70 mmHg     Max Ex. HR 110 bpm     Max Ex. BP 132/62 mmHg     RPE 12     Symptoms No        Initial Exercise Prescription:     Initial Exercise Prescription - 02/23/15 1400    Date of Initial Exercise Prescription   Date 02/23/15   Treadmill   MPH 1.2   Grade 0   Minutes 10  Use intervals to complete time goals if necessary   Bike   Level 0.2   Minutes 10   Recumbant Bike    Level 3   RPM 40   Watts 25   Minutes 15   NuStep   Level 2   Watts 30   Minutes 15   Arm Ergometer   Level 1   Watts 8   Minutes 10   Arm/Foot Ergometer   Level 4   Watts 15   Minutes 10   Cybex   Level 1   RPM 50   Minutes 10   Recumbant Elliptical   Level 1   RPM 40   Watts 10   Minutes 10   Elliptical   Level 1   Speed 3   Minutes 1   REL-XR   Level 2   Watts 30   Minutes 15   Prescription Details   Frequency (times per week) 3   Duration Progress to 30 minutes of continuous aerobic without signs/symptoms of physical distress   Intensity   THRR REST +  30   Ratings of Perceived Exertion 11-15   Progression Continue progressive overload as per policy without signs/symptoms or physical distress.   Resistance Training   Training Prescription Yes   Weight 2   Reps 10-15      Exercise Prescription Changes:     Exercise Prescription Changes      03/10/15 1600           Recumbant Elliptical   Level 4       Watts 25       Minutes 30          Discharge Exercise Prescription (Final Exercise Prescription Changes):     Exercise Prescription Changes - 03/10/15 1600    Recumbant Elliptical   Level 4   Watts 25   Minutes 30      Nutrition:  Target Goals: Understanding of nutrition guidelines, daily intake of sodium 1500mg , cholesterol 200mg , calories 30% from fat and 7% or less from saturated fats, daily to have 5 or more servings of fruits and vegetables.  Biometrics:     Pre Biometrics - 02/23/15 1437    Pre Biometrics   Height 6' 0.2" (1.834 m)   Weight 185 lb 12.8 oz (84.278 kg)   Waist Circumference 40.5 inches   Hip Circumference 44 inches   Waist to Hip Ratio 0.92 %   BMI (Calculated)  25.1       Nutrition Therapy Plan and Nutrition Goals:     Nutrition Therapy & Goals - 02/26/15 1124    Nutrition Therapy   Diet Patient declined MNT at this time.       Nutrition Discharge: Rate Your Plate Scores:     Rate Your Plate  - 56/81/27 5170    Rate Your Plate Scores   Pre Score --  Not done by patient age 37      Nutrition Goals Re-Evaluation:     Nutrition Goals Re-Evaluation      03/11/15 1726           Personal Goal #1 Re-Evaluation   Personal Goal #1 Jacob Reyes cont to eat well.        Goal Progress Seen Yes          Psychosocial: Target Goals: Acknowledge presence or absence of depression, maximize coping skills, provide positive support system. Participant is able to verbalize types and ability to use techniques and skills needed for reducing stress and depression.  Initial Review & Psychosocial Screening:     Initial Psych Review & Screening - 02/23/15 1109    Initial Review   Current issues with Current Sleep Concerns   Family Dynamics   Good Support System? Yes   Comments Discussed maybe sleeping in recliner since his upper chest has some burning where he removed an artery to use. Dr. Jobie Quaker told them some people have that and sit in waiting room with their shirts open to not touch that site. (patient reports)      Quality of Life Scores:     Quality of Life - 02/23/15 1506    Quality of Life Scores   Health/Function Pre --  Not done by patient. Jacob Reyes said he is 66 and really doesn't have any major problems at his age and his wife agreed.       PHQ-9:     Recent Review Flowsheet Data    Depression screen [facility] 2/9 02/23/2015   Decreased Interest 0   Down, Depressed, Hopeless 0   PHQ - 2 Score 0   Altered sleeping 3   Tired, decreased energy 0   Change in appetite 0   Feeling bad or failure about yourself  0   Trouble concentrating 0   Moving slowly or fidgety/restless 0   Suicidal thoughts 0   PHQ-9 Score 3      Psychosocial Evaluation and Intervention:   Psychosocial Re-Evaluation:     Psychosocial Re-Evaluation      03/11/15 1728           Psychosocial Re-Evaluation   Interventions Encouraged to attend Cardiac Rehabilitation for the exercise        Comments Jacob Reyes has a $40 copay each time he attends Cardiac Rehab. He did not say he wanted to leave yet and he mentioned the $40 copay.        Continued Psychosocial Services Needed Yes          Vocational Rehabilitation: Provide vocational rehab assistance to qualifying candidates.   Vocational Rehab Evaluation & Intervention:     Vocational Rehab - 02/23/15 1107    Initial Vocational Rehab Evaluation & Intervention   Assessment shows need for Vocational Rehabilitation No      Education: Education Goals: Education classes will be provided on a weekly basis, covering required topics. Participant will state understanding/return demonstration of topics presented.  Learning Barriers/Preferences:     Learning Barriers/Preferences - 02/23/15 1503  Learning Barriers/Preferences   Learning Barriers Hearing      Education Topics: General Nutrition Guidelines/Fats and Fiber: -Group instruction provided by verbal, written material, models and posters to present the general guidelines for heart healthy nutrition. Gives an explanation and review of dietary fats and fiber.          Cardiac Rehab from 03/01/2015 in [facility] Cardiac Rehab   Date  03/01/15   Educator  PI   Instruction Review Code  2- meets goals/outcomes      Controlling Sodium/Reading Food Labels: -Group verbal and written material supporting the discussion of sodium use in heart healthy nutrition. Review and explanation with models, verbal and written materials for utilization of the food label.   Exercise Physiology & Risk Factors: - Group verbal and written instruction with models to review the exercise physiology of the cardiovascular system and associated critical values. Details cardiovascular disease risk factors and the goals associated with each risk factor.   Aerobic Exercise & Resistance Training: - Gives group verbal and written discussion on the health impact of inactivity. On the components of aerobic  and resistive training programs and the benefits of this training and how to safely progress through these programs.   Flexibility, Balance, General Exercise Guidelines: - Provides group verbal and written instruction on the benefits of flexibility and balance training programs. Provides general exercise guidelines with specific guidelines to those with heart or lung disease. Demonstration and skill practice provided.   Stress Management: - Provides group verbal and written instruction about the health risks of elevated stress, cause of high stress, and healthy ways to reduce stress.   Depression: - Provides group verbal and written instruction on the correlation between heart/lung disease and depressed mood, treatment options, and the stigmas associated with seeking treatment.   Anatomy & Physiology of the Heart: - Group verbal and written instruction and models provide basic cardiac anatomy and physiology, with the coronary electrical and arterial systems. Review of: AMI, Angina, Valve disease, Heart Failure, Cardiac Arrhythmia, Pacemakers, and the ICD.   Cardiac Procedures: - Group verbal and written instruction and models to describe the testing methods done to diagnose heart disease. Reviews the outcomes of the test results. Describes the treatment choices: Medical Management, Angioplasty, or Coronary Bypass Surgery.   Cardiac Medications: - Group verbal and written instruction to review commonly prescribed medications for heart disease. Reviews the medication, class of the drug, and side effects. Includes the steps to properly store meds and maintain the prescription regimen.   Go Sex-Intimacy & Heart Disease, Get SMART - Goal Setting: - Group verbal and written instruction through game format to discuss heart disease and the return to sexual intimacy. Provides group verbal and written material to discuss and apply goal setting through the application of the S.M.A.R.T.  Method.   Other Matters of the Heart: - Provides group verbal, written materials and models to describe Heart Failure, Angina, Valve Disease, and Diabetes in the realm of heart disease. Includes description of the disease process and treatment options available to the cardiac patient.   Exercise & Equipment Safety: - Individual verbal instruction and demonstration of equipment use and safety with use of the equipment.      Cardiac Rehab from 03/01/2015 in [facility] Cardiac Rehab   Date  02/23/15   Educator  C. Carlee Vonderhaar,RN   Instruction Review Code  1- partially meets, needs review/practice      Infection Prevention: - Provides verbal and written material to individual with discussion of infection  control including proper hand washing and proper equipment cleaning during exercise session.      Cardiac Rehab from 03/01/2015 in [facility] Cardiac Rehab   Date  02/23/15   Educator  C. Laticia Vannostrand,RN   Instruction Review Code  2- meets goals/outcomes      Falls Prevention: - Provides verbal and written material to individual with discussion of falls prevention and safety.      Cardiac Rehab from 03/01/2015 in [facility] Cardiac Rehab   Date  02/23/15   Educator  C. Isaly Fasching,RN   Instruction Review Code  2- meets goals/outcomes      Diabetes: - Individual verbal and written instruction to review signs/symptoms of diabetes, desired ranges of glucose level fasting, after meals and with exercise. Advice that pre and post exercise glucose checks will be done for 3 sessions at entry of program.    Knowledge Questionnaire Score:     Knowledge Questionnaire Score - 02/23/15 1503    Knowledge Questionnaire Score   Pre Score Not done by patient      Personal Goals and Risk Factors at Admission:     Personal Goals and Risk Factors at Admission - 02/24/15 1410    Personal Goals and Risk Factors on Admission    Weight Management No   Intervention Learn and follow the exercise and diet guidelines  while in the program. Utilize the nutrition and education classes to help gain knowledge of the diet and exercise expectations in the program   Admit Weight 178 lb (80.74 kg)  Jacob Reyes reported Dr. Sabra Heck stopped his fluid pills since his weight was down 12 lbs and sodium was too low.    Goal Weight 190 lb (86.183 kg)      Personal Goals and Risk Factors Review:      Goals and Risk Factor Review      03/03/15 1837 03/11/15 1727         Increase Aerobic Exercise and Physical Activity   Goals Progress/Improvement seen  Yes Yes      Comments  Jacob Reyes feels he isn't getting strength like he would like to but Jacob Reyes said he did not like taking the Paxil that the doctor suggested. Jacob Reyes reports that he has another MD appt with Dr. Emily Filbert next week.          Personal Goals Discharge (Final Personal Goals and Risk Factors Review):      Goals and Risk Factor Review - 03/11/15 1727    Increase Aerobic Exercise and Physical Activity   Goals Progress/Improvement seen  Yes   Comments Jacob Reyes feels he isn't getting strength like he would like to but Jacob Reyes said he did not like taking the Paxil that the doctor suggested. Jacob Reyes reports that he has another MD appt with Dr. Emily Filbert next week.        Comments: Jacob Reyes reported today when he was 1/2 way through exercising in Cardiac Rehab that he was out Monday at the doctors office had approx. 1 liter of fluid taken off his lung.No c/o today. Pulse ox room air was 99%. Blood pressure stable and no changes in heart rhythm.

## 2015-03-13 LAB — CULTURE, BODY FLUID-BOTTLE

## 2015-03-13 LAB — CULTURE, BODY FLUID W GRAM STAIN -BOTTLE: Culture: NO GROWTH

## 2015-03-14 ENCOUNTER — Emergency Department
Admission: EM | Admit: 2015-03-14 | Discharge: 2015-03-14 | Disposition: A | Discharge status: Home / Self Care | Payer: Medicare Other | Attending: Emergency Medicine | Admitting: Emergency Medicine

## 2015-03-14 DIAGNOSIS — Z79899 Other long term (current) drug therapy: Secondary | ICD-10-CM | POA: Insufficient documentation

## 2015-03-14 DIAGNOSIS — Z7952 Long term (current) use of systemic steroids: Secondary | ICD-10-CM | POA: Insufficient documentation

## 2015-03-14 DIAGNOSIS — Z7982 Long term (current) use of aspirin: Secondary | ICD-10-CM | POA: Diagnosis not present

## 2015-03-14 DIAGNOSIS — R55 Syncope and collapse: Secondary | ICD-10-CM | POA: Diagnosis present

## 2015-03-14 DIAGNOSIS — I10 Essential (primary) hypertension: Secondary | ICD-10-CM | POA: Insufficient documentation

## 2015-03-14 DIAGNOSIS — Z7902 Long term (current) use of antithrombotics/antiplatelets: Secondary | ICD-10-CM | POA: Insufficient documentation

## 2015-03-14 LAB — URINALYSIS COMPLETE WITH MICROSCOPIC (ARMC ONLY)
BACTERIA UA: NONE SEEN
BILIRUBIN URINE: NEGATIVE
GLUCOSE, UA: NEGATIVE mg/dL
Ketones, ur: NEGATIVE mg/dL
LEUKOCYTES UA: NEGATIVE
NITRITE: NEGATIVE
PH: 7 (ref 5.0–8.0)
Protein, ur: NEGATIVE mg/dL
SPECIFIC GRAVITY, URINE: 1.009 (ref 1.005–1.030)
SQUAMOUS EPITHELIAL / LPF: NONE SEEN
WBC UA: NONE SEEN WBC/hpf (ref 0–5)

## 2015-03-14 LAB — TROPONIN I: TROPONIN I: 0.03 ng/mL (ref <0.031)

## 2015-03-14 LAB — BASIC METABOLIC PANEL
ANION GAP: 7 (ref 5–15)
BUN: 14 mg/dL (ref 6–20)
CHLORIDE: 94 mmol/L — AB (ref 101–111)
CO2: 28 mmol/L (ref 22–32)
Calcium: 8.9 mg/dL (ref 8.9–10.3)
Creatinine, Ser: 0.9 mg/dL (ref 0.61–1.24)
Glucose, Bld: 115 mg/dL — ABNORMAL HIGH (ref 65–99)
POTASSIUM: 4.4 mmol/L (ref 3.5–5.1)
SODIUM: 129 mmol/L — AB (ref 135–145)

## 2015-03-14 LAB — CBC
HEMATOCRIT: 45.1 % (ref 40.0–52.0)
HEMOGLOBIN: 15.2 g/dL (ref 13.0–18.0)
MCH: 31.1 pg (ref 26.0–34.0)
MCHC: 33.7 g/dL (ref 32.0–36.0)
MCV: 92.1 fL (ref 80.0–100.0)
Platelets: 184 10*3/uL (ref 150–440)
RBC: 4.89 MIL/uL (ref 4.40–5.90)
RDW: 18.1 % — ABNORMAL HIGH (ref 11.5–14.5)
WBC: 8.1 10*3/uL (ref 3.8–10.6)

## 2015-03-14 LAB — GLUCOSE, CAPILLARY: GLUCOSE-CAPILLARY: 112 mg/dL — AB (ref 65–99)

## 2015-03-14 NOTE — Discharge Instructions (Signed)
He felt fatigued while making dinner, sat down and then ate dinner, then had an episode of loss of consciousness. He had no chest pain or palpitations before or after this event. Currently you look well and your blood tests look all right. Your EKG looks good, too. You feel comfortable and would like to go home. This was discussed with Dr. Nehemiah Massed, [facility] [place] cardiology, and he agrees with this. Said why. Please return to the emergency department if you have any chest pain, further weakness, or syncopal episodes. Call [facility] clinic tomorrow to arrange for follow-up in their offices the same day. Dr. Nehemiah Massed will be letting the staff know that you'll need to be seen and they may call you as well.  Syncope Syncope is a medical term for fainting or passing out. This means you lose consciousness and drop to the ground. People are generally unconscious for less than 5 minutes. You may have some muscle twitches for up to 15 seconds before waking up and returning to normal. Syncope occurs more often in older adults, but it can happen to anyone. While most causes of syncope are not dangerous, syncope can be a sign of a serious medical problem. It is important to seek medical care.  CAUSES  Syncope is caused by a sudden drop in blood flow to the brain. The specific cause is often not determined. Factors that can bring on syncope include:  Taking medicines that lower blood pressure.  Sudden changes in posture, such as standing up quickly.  Taking more medicine than prescribed.  Standing in one place for too long.  Seizure disorders.  Dehydration and excessive exposure to heat.  Low blood sugar (hypoglycemia).  Straining to have a bowel movement.  Heart disease, irregular heartbeat, or other circulatory problems.  Fear, emotional distress, seeing blood, or severe pain. SYMPTOMS  Right before fainting, you may:  Feel dizzy or light-headed.  Feel nauseous.  See all white or all black in your  field of vision.  Have cold, clammy skin. DIAGNOSIS  Your health care provider will ask about your symptoms, perform a physical exam, and perform an electrocardiogram (ECG) to record the electrical activity of your heart. Your health care provider may also perform other heart or blood tests to determine the cause of your syncope which may include:  Transthoracic echocardiogram (TTE). During echocardiography, sound waves are used to evaluate how blood flows through your heart.  Transesophageal echocardiogram (TEE).  Cardiac monitoring. This allows your health care provider to monitor your heart rate and rhythm in real time.  Holter monitor. This is a portable device that records your heartbeat and can help diagnose heart arrhythmias. It allows your health care provider to track your heart activity for several days, if needed.  Stress tests by exercise or by giving medicine that makes the heart beat faster. TREATMENT  In most cases, no treatment is needed. Depending on the cause of your syncope, your health care provider may recommend changing or stopping some of your medicines. HOME CARE INSTRUCTIONS  Have someone stay with you until you feel stable.  Do not drive, use machinery, or play sports until your health care provider says it is okay.  Keep all follow-up appointments as directed by your health care provider.  Lie down right away if you start feeling like you might faint. Breathe deeply and steadily. Wait until all the symptoms have passed.  Drink enough fluids to keep your urine clear or pale yellow.  If you are taking blood pressure  or heart medicine, get up slowly and take several minutes to sit and then stand. This can reduce dizziness. SEEK IMMEDIATE MEDICAL CARE IF:   You have a severe headache.  You have unusual pain in the chest, abdomen, or back.  You are bleeding from your mouth or rectum, or you have black or tarry stool.  You have an irregular or very fast  heartbeat.  You have pain with breathing.  You have repeated fainting or seizure-like jerking during an episode.  You faint when sitting or lying down.  You have confusion.  You have trouble walking.  You have severe weakness.  You have vision problems. If you fainted, call your local emergency services (911 in [place] Do not drive yourself to the hospital.    This information is not intended to replace advice given to you by your health care provider. Make sure you discuss any questions you have with your health care provider.   Document Released: 05/08/2005 Document Revised: 09/22/2014 Document Reviewed: 07/07/2011 Elsevier Interactive Patient Education [employer].

## 2015-03-14 NOTE — ED Notes (Signed)
fsbs per ems 95

## 2015-03-14 NOTE — ED Notes (Signed)
Ems states pt with syncopal episode while eating dinner this pm. Ems states pt  Was "lethargic" on their arrival. Pt with cms intact to all extremities, perrl 60mm brisk, pt alert and oriented x4 currently. Pt denies pain. Skin pale, dry, resps unalbored.

## 2015-03-14 NOTE — ED Provider Notes (Signed)
[facility] Emergency Department Provider Note  ____________________________________________  Time seen:  2128  I have reviewed the triage vital signs and the nursing notes.   HISTORY  Chief Complaint Loss of Consciousness  syncope    HPI Jacob Reyes is a 81 y.o. male who recently had a 6 vessel CABG in August of this year. He reports he was feeling a little bit weak this evening before dinner. He did help repair it and felt somewhat weak. He said out of the table and ate dinner, but once dinner was over he had a syncopal episode. His wife was present and witnessing him. She reports she tried away, but he could not wake up. This lasted for 10-15 minutes. The patient denies any chest pain or palpitations prior to or after this syncopal episode. The patient reports he feels well currently. He denies any headache earlier or now. He denies any focal weakness.  The patient's wife reports he has not been drinking as much water today as he usually has been.    Past Medical History  Diagnosis Date  . Glaucoma   . Hearing loss   . Reflux   . GERD (gastroesophageal reflux disease)   . Cancer [facility])     prostate  . Colon cancer ([place])   . Coronary artery disease   . Hypertension   . Abnormal stress test   . Depression   . Anxiety     just started med. for anxiety   . History of hiatal hernia     Patient Active Problem List   Diagnosis Date Noted  . Anticoagulated-Eliquis added post CABG 01/06/2015  . Atrial fibrillation with RVR post op 01/02/2015  . Dyslipidemia 01/02/2015  . Hypotension post op 01/02/2015  . GERD (gastroesophageal reflux disease) 01/02/2015  . S/P CABG x 6 12/28/2014  . Coronary artery disease 12/10/2014  . Angina, class III ([place]) 12/10/2014  . Combined fat and carbohydrate induced hyperlipemia 05/25/2014  . Cancer of colon- remote surgery 11/16/2013    Past Surgical History  Procedure Laterality Date  . Colon surgery    .  Cardiac catheterization N/A 12/10/2014    Procedure: Left Heart Cath and Coronary Angiography;  Surgeon: Teodoro Spray, MD;  Location: [facility];  Service: Cardiovascular;  Laterality: N/A;  . Foot surgery Right 2009    Shorten metatarsal  . Hernia repair Bilateral 1991    inguinal   . Tonsillectomy    . Eye surgery      catarcats removed bilataeral /w IOL  . Coronary artery bypass graft N/A 12/28/2014    Procedure: CORONARY ARTERY BYPASS GRAFTING (CABG), ON PUMP, TIMES SIX, USING LEFT INTERNAL MAMMARY ARTERY, RIGHT GREATER SAPHENOUS VEIN HARVESTED ENDOSCOPICALLY;  Surgeon: [name], MD;  Location: [place];  Service: Open Heart Surgery;  Laterality: N/A;  -SEQ LIMA to MID LAD and DISTAL LAD -SVG to DIAGONAL -SEQ SVG to OM1 and OM2 -SVG to PDA  . Tee without cardioversion N/A 12/28/2014    Procedure: TRANSESOPHAGEAL ECHOCARDIOGRAM (TEE);  Surgeon: [name], MD;  Location: [place];  Service: Open Heart Surgery;  Laterality: N/A;    Current Outpatient Rx  Name  Route  Sig  Dispense  Refill  . apixaban (ELIQUIS) 5 MG TABS tablet   Oral   Take 1 tablet (5 mg total) by mouth every 12 (twelve) hours.   60 tablet      . aspirin EC 81 MG tablet   Oral  Take 81 mg by mouth daily.         . brimonidine-timolol (COMBIGAN) 0.2-0.5 % ophthalmic solution   Both Eyes   Place 1 drop into both eyes 2 (two) times daily.         . cholecalciferol (VITAMIN D) 1000 UNITS tablet   Oral   Take 1,000 Units by mouth daily.         . finasteride (PROSCAR) 5 MG tablet   Oral   Take 5 mg by mouth daily after supper.          . levothyroxine (SYNTHROID, LEVOTHROID) 75 MCG tablet   Oral   Take 75 mcg by mouth daily before breakfast.          . lovastatin (MEVACOR) 40 MG tablet   Oral   Take 40 mg by mouth daily after supper.          Marland Kitchen LUMIGAN 0.01 % SOLN   Both Eyes   Place 1 drop into both eyes at bedtime.          . magnesium chloride (SLOW-MAG) 64 MG  TBEC SR tablet   Oral   Take 2 tablets (128 mg total) by mouth daily.   60 tablet      . Melatonin 5 MG TABS   Oral   Take 5 mg by mouth at bedtime as needed (sleep).         Marland Kitchen omeprazole (PRILOSEC) 20 MG capsule   Oral   Take 20 mg by mouth daily before breakfast.          . polyethylene glycol powder (GLYCOLAX/MIRALAX) powder   Oral   Take 17 g by mouth daily.          . tamsulosin (FLOMAX) 0.4 MG CAPS capsule   Oral   Take 0.4 mg by mouth 2 (two) times daily.            Allergies Review of patient's allergies indicates no known allergies.  No family history on file.  Social History Social History  Substance Use Topics  . Smoking status: Never Smoker   . Smokeless tobacco: Never Used  . Alcohol Use: No    Review of Systems  Constitutional: Negative for fever. ENT: Negative for sore throat. Cardiovascular: Negative for chest pain. Episode of syncope this evening. See history of present illness Respiratory: Negative for cough. Gastrointestinal: Negative for abdominal pain, vomiting and diarrhea. Genitourinary: Negative for dysuria. Musculoskeletal: No myalgias or injuries. Skin: Negative for rash. Neurological: Negative for paresthesia or weakness   10-point ROS otherwise negative.  ____________________________________________   PHYSICAL EXAM:  VITAL SIGNS: ED Triage Vitals  Enc Vitals Group     BP 03/14/15 2044 140/98 mmHg     Pulse Rate 03/14/15 2040 73     Resp 03/14/15 2040 16     Temp 03/14/15 2040 97.9 F (36.6 C)     Temp Source 03/14/15 2040 Oral     SpO2 03/14/15 2040 98 %     Weight 03/14/15 2040 192 lb (87.091 kg)     Height 03/14/15 2040 6\' 1"  (1.854 m)     Head Cir --      Peak Flow --      Pain Score --      Pain Loc --      Pain Edu? --      Excl. in [place]? --     Constitutional: Alert and oriented. Thin, but well appearing and in no distress. Ambulatory. ENT  Head: Normocephalic and atraumatic.   Nose: No  congestion/rhinnorhea.    Cardiovascular: Normal rate at 74, regular rhythm, no murmur noted Respiratory:  Normal respiratory effort, no tachypnea.    Breath sounds are clear and equal bilaterally.  Gastrointestinal: Soft and nontender. No distention.  Back: No muscle spasm, no tenderness, no CVA tenderness. Musculoskeletal: No deformity noted. Nontender with normal range of motion in all extremities.  No noted edema. Neurologic:  Normal speech and language. No gross focal neurologic deficits are appreciated.  Skin:  Skin is warm, dry. No rash noted. Psychiatric: Mood and affect are normal. Speech and behavior are normal.  ____________________________________________    LABS (pertinent positives/negatives)  Labs Reviewed  BASIC METABOLIC PANEL - Abnormal; Notable for the following:    Sodium 129 (*)    Chloride 94 (*)    Glucose, Bld 115 (*)    All other components within normal limits  CBC - Abnormal; Notable for the following:    RDW 18.1 (*)    All other components within normal limits  URINALYSIS COMPLETEWITH MICROSCOPIC ([facility] ONLY) - Abnormal; Notable for the following:    Color, Urine YELLOW (*)    APPearance CLEAR (*)    Hgb urine dipstick 2+ (*)    All other components within normal limits  GLUCOSE, CAPILLARY - Abnormal; Notable for the following:    Glucose-Capillary 112 (*)    All other components within normal limits  TROPONIN I  CBG MONITORING, ED     ____________________________________________   EKG ED ECG REPORT I, Quron Ruddy W, the attending physician, personally viewed and interpreted this ECG.   Date: 03/14/2015  EKG Time: 2044  Rate: 69  Rhythm: Normal sinus rhythm  Axis: Normal  Intervals: PR of 222, prolonged.  ST&T Change: Minimal ST elevation in lead 2 and aVF  Unchanged from 12/29/2014.   ____________________________________________  ____________________________________________   INITIAL IMPRESSION / ASSESSMENT AND PLAN / ED  COURSE  Pertinent labs & imaging results that were available during my care of the patient were reviewed by me and considered in my medical decision making (see chart for details).  Pleasant alert 81 year old male in no acute distress. He was feeling fatigued, sat at the table and ate his dinner, and then became unresponsive for 10-15 minutes. The wife reports she was unable to arouse him. He then awoke without any distress or apparent adverse reaction.  It is unclear if this is a syncopal episode or somnolence due to fatigue. He is still recovering from a 6 vessel CABG that was in early August of this year.   The patient has no change in his EKG from August 9 of this year. There was a small amount of ST elevation noted in lead 2 at that time as well.  ----------------------------------------- 10:23 PM on 03/14/2015 -----------------------------------------  There is a delay in the return of his troponin value due to the troponin being added a little bit after the initial order set. I called and spoke with the lab who did an excellent job processing the order. Troponin is negative.  I am calling [facility] cardiology to review the patient's situation and determine his disposition.  ----------------------------------------- 11:05 PM on 03/14/2015 -----------------------------------------  I discussed case with Dr. Nehemiah Massed who agrees with discharging the patient home. The patient is very comfortable with this and will follow-up with [facility] cardiology tomorrow.  ____________________________________________   FINAL CLINICAL IMPRESSION(S) / ED DIAGNOSES  Final diagnoses:  Syncope, unspecified syncope type   coronary artery disease, chronic,  status post CABG    Ahmed Prima, MD 03/14/15 2306

## 2015-03-14 NOTE — ED Notes (Addendum)
Patient with no complaints at this time. Respirations even and unlabored. Skin warm/dry. Discharge instructions reviewed with patient at this time. Patient given opportunity to voice concerns/ask questions. IV removed per policy and band-aid applied to site. Patient discharged at this time and left Emergency Department, via wheelchair.   

## 2015-03-17 ENCOUNTER — Encounter: Payer: Medicare Other | Admitting: *Deleted

## 2015-03-17 DIAGNOSIS — Z951 Presence of aortocoronary bypass graft: Secondary | ICD-10-CM

## 2015-03-17 NOTE — Progress Notes (Signed)
Daily Session Note  Patient Details  Name: Jacob Reyes MRN: [id number] Date of Birth: 08/27/1925 Referring Provider:  Rusty Aus, MD  Encounter Date: 03/17/2015  Check In:     Session Check In - 03/17/15 1641    Check-In   Staff Present Candiss Norse MS, ACSM CEP Exercise Physiologist;Carroll Enterkin RN, BSN;Diane Joya Gaskins RN, BSN   ER physicians immediately available to respond to emergencies See telemetry face sheet for immediately available ER MD   Medication changes reported     No   Fall or balance concerns reported    No   Warm-up and Cool-down Performed on first and last piece of equipment   VAD Patient? No   Pain Assessment   Currently in Pain? No/denies   Multiple Pain Sites No         Goals Met:  Independence with exercise equipment Exercise tolerated well No report of cardiac concerns or symptoms Strength training completed today  Goals Unmet:  Not Applicable  Goals Comments: Patient completed exercise prescription and all exercise goals during rehab session. The exercise was tolerated well and the patient is progressing in the program.    Dr. Emily Filbert is Medical Director for [place] and [facility].

## 2015-03-17 NOTE — Progress Notes (Signed)
Daily Session Note  Patient Details  Name: Jacob Reyes MRN: [id number] Date of Birth: Feb 25, 1926 Referring Provider:  Rusty Aus, MD  Encounter Date: 03/17/2015  Check In:     Session Check In - 03/17/15 1641    Check-In   Staff Present Candiss Norse MS, ACSM CEP Exercise Physiologist;Carroll Enterkin RN, BSN;Diane Joya Gaskins RN, BSN   ER physicians immediately available to respond to emergencies See telemetry face sheet for immediately available ER MD   Medication changes reported     No   Fall or balance concerns reported    No   Warm-up and Cool-down Performed on first and last piece of equipment   VAD Patient? No   Pain Assessment   Currently in Pain? No/denies   Multiple Pain Sites No           Exercise Prescription Changes - 03/17/15 1800    Recumbant Elliptical   Level 5   Watts 30   Minutes 30      Goals Met:  Independence with exercise equipment Exercise tolerated well No report of cardiac concerns or symptoms Strength training completed today  Goals Unmet:  Not Applicable  Goals Comments: Patient informed staff of new medications he is taking - Paxil and Sonata.  He informed staff he passed out Sunday evening while sitting at the dinner table.  He went to the Emergency Room at [facility].  He reported there were no abnormal findings are cause detected for his passing out spell.  EKG per pt was normal.  Pt was seen by Dr. Emily Filbert on Monday of this week, who instructed the patient to return to Cardiac Rehab.  Exercise was tolerated well today.  Vital signs stable.     Dr. Emily Filbert is Medical Director for [place] and [facility].

## 2015-03-18 DIAGNOSIS — Z951 Presence of aortocoronary bypass graft: Secondary | ICD-10-CM

## 2015-03-18 NOTE — Progress Notes (Signed)
Daily Session Note  Patient Details  Name: VALOR QUAINTANCE MRN: [id number] Date of Birth: 1925/10/12 Referring Provider:  Teodoro Spray, MD  Encounter Date: 03/18/2015  Check In:     Session Check In - 03/18/15 1628    Check-In   Staff Present Lestine Box BS, ACSM EP-C, Exercise Physiologist;Carroll Enterkin RN, BSN;Diane Joya Gaskins RN, BSN   ER physicians immediately available to respond to emergencies See telemetry face sheet for immediately available ER MD   Medication changes reported     No   Fall or balance concerns reported    No   Warm-up and Cool-down Performed on first and last piece of equipment   VAD Patient? No   Pain Assessment   Currently in Pain? No/denies           Exercise Prescription Changes - 03/17/15 1800    Recumbant Elliptical   Level 5   Watts 30   Minutes 30      Goals Met:  Proper associated with RPD/PD & O2 Sat Exercise tolerated well No report of cardiac concerns or symptoms Strength training completed today  Goals Unmet:  Not Applicable  Goals Comments:    Dr. Emily Filbert is Medical Director for [place] and [facility].

## 2015-03-22 ENCOUNTER — Encounter: Payer: Self-pay | Admitting: *Deleted

## 2015-03-24 ENCOUNTER — Other Ambulatory Visit: Payer: Self-pay | Admitting: Cardiothoracic Surgery

## 2015-03-24 ENCOUNTER — Encounter: Payer: Medicare Other | Attending: Cardiology

## 2015-03-24 DIAGNOSIS — Z951 Presence of aortocoronary bypass graft: Secondary | ICD-10-CM | POA: Insufficient documentation

## 2015-03-25 ENCOUNTER — Encounter: Payer: Medicare Other | Admitting: Cardiothoracic Surgery

## 2015-03-25 ENCOUNTER — Encounter: Payer: Self-pay | Admitting: Cardiothoracic Surgery

## 2015-03-25 ENCOUNTER — Ambulatory Visit
Admission: RE | Admit: 2015-03-25 | Discharge: 2015-03-25 | Disposition: A | Discharge status: Home / Self Care | Payer: Medicare Other | Source: Ambulatory Visit | Attending: Cardiothoracic Surgery | Admitting: Cardiothoracic Surgery

## 2015-03-25 ENCOUNTER — Ambulatory Visit (INDEPENDENT_AMBULATORY_CARE_PROVIDER_SITE_OTHER): Payer: Self-pay | Admitting: Cardiothoracic Surgery

## 2015-03-25 VITALS — BP 124/90 | HR 96 | Resp 20 | Ht 73.0 in | Wt 192.0 lb

## 2015-03-25 DIAGNOSIS — J90 Pleural effusion, not elsewhere classified: Secondary | ICD-10-CM

## 2015-03-25 DIAGNOSIS — Z951 Presence of aortocoronary bypass graft: Secondary | ICD-10-CM

## 2015-03-25 DIAGNOSIS — Z9889 Other specified postprocedural states: Secondary | ICD-10-CM

## 2015-03-25 DIAGNOSIS — J948 Other specified pleural conditions: Secondary | ICD-10-CM

## 2015-03-25 DIAGNOSIS — I2510 Atherosclerotic heart disease of native coronary artery without angina pectoris: Secondary | ICD-10-CM

## 2015-03-25 NOTE — Progress Notes (Signed)
[address]       [place] [address]             [phone]      Jacob Reyes [place] Medical Record #[id number] Date of Birth: 02/05/1926  Referring: Teodoro Spray, MD Primary Care: Rusty Aus., MD  Chief Complaint:   POST OP FOLLOW UP 12/28/2014 OPERATIVE REPORT PREOPERATIVE DIAGNOSIS: Severe 3-vessel coronary artery disease. POSTOPERATIVE DIAGNOSIS: Severe 3-vessel coronary artery disease. SURGICAL PROCEDURE: Coronary artery bypass grafting x6, with left internal mammary to the left anterior descending coronary artery in 2 sites, in the mid vessel and the distal LAD, reverse saphenous vein graft to the diagonal, sequential reverse saphenous vein graft to the first and second obtuse marginal, and reverse saphenous vein graft to the mid posterior descending coronary artery, with right leg greater saphenous vein harvesting from the thigh and calf. SURGEON: Lanelle Bal, M.D.  History of Present Illness:     Patient making reasonable progression postop. He's now back at home from skilled nursing facility and is to start in cardiac rehabilitation next week. Patient unaware of any recurrent AFIB. He had significant fluid retention especially in his lower extremities this is improved and because of generalized weakness his Lasix was discontinued, . He is increasing his activity. He notes after taking Paxil and sleeping medication he had a near syncopal episode and is felt weak. Currently is off the Paxil    Past Medical History  Diagnosis Date  . Glaucoma   . Hearing loss   . Reflux   . GERD (gastroesophageal reflux disease)   . Cancer [facility])     prostate  . Colon cancer ([place])   . Coronary artery disease   . Hypertension   . Abnormal stress test   . Depression   . Anxiety     just started med. for anxiety   . History of hiatal hernia      History  Smoking status  . Never Smoker   Smokeless tobacco  . Never Used      History  Alcohol Use No     No Known Allergies  Current Outpatient Prescriptions  Medication Sig Dispense Refill  . aspirin EC 81 MG tablet Take 81 mg by mouth daily.    . brimonidine-timolol (COMBIGAN) 0.2-0.5 % ophthalmic solution Place 1 drop into both eyes 2 (two) times daily.    . cholecalciferol (VITAMIN D) 1000 UNITS tablet Take 1,000 Units by mouth daily.    . finasteride (PROSCAR) 5 MG tablet Take 5 mg by mouth daily after supper.     . levothyroxine (SYNTHROID, LEVOTHROID) 75 MCG tablet Take 75 mcg by mouth daily before breakfast.     . lovastatin (MEVACOR) 40 MG tablet Take 40 mg by mouth daily after supper.     Marland Kitchen LUMIGAN 0.01 % SOLN Place 1 drop into both eyes at bedtime.     . magnesium chloride (SLOW-MAG) 64 MG TBEC SR tablet Take 2 tablets (128 mg total) by mouth daily. 60 tablet   . Melatonin 5 MG TABS Take 5 mg by mouth at bedtime as needed (sleep).    Marland Kitchen omeprazole (PRILOSEC) 20 MG capsule Take 20 mg by mouth daily before breakfast.     . polyethylene glycol powder (GLYCOLAX/MIRALAX) powder Take 17 g by mouth daily.     . tamsulosin (FLOMAX) 0.4 MG CAPS capsule Take 0.4 mg by mouth 2 (two) times daily.      No current  facility-administered medications for this visit.       Physical Exam: BP 124/90 mmHg  Pulse 96  Resp 20  Ht 6\' 1"  (1.854 m)  Wt 192 lb (87.091 kg)  BMI 25.34 kg/m2  SpO2 97%  General appearance: alert, cooperative, appears stated age and no distress Neurologic: intact Heart: regular rate and rhythm, S1, S2 normal, no murmur, click, rub or gallop Lungs: diminished breath sounds LLL Abdomen: soft, non-tender; bowel sounds normal; no masses,  no organomegaly Extremities: extremities normal, atraumatic, no cyanosis or edema Wound: sternum satble healing well, has very mild pedal edema much improved from previous exams   Diagnostic Studies & Laboratory data:     Recent Radiology Findings:   Dg Chest 2 View  03/25/2015  CLINICAL DATA:   Coronary artery disease.  Pleural effusion. EXAM: CHEST  2 VIEW COMPARISON:  03/08/2015.  02/25/2015. FINDINGS: Mediastinum and hilar structures normal. Prior CABG. Heart size normal. Left base atelectasis. Recurrent moderate left-sided pleural effusion. No acute bony abnormality . IMPRESSION: 1. Recurrent moderate left-sided pleural effusion. 2. Left base subsegmental atelectasis. 3. Prior CABG.  Heart size stable. Electronically Signed   By: Marcello Moores  Register   On: 03/25/2015 13:15      Recent Lab Findings: Lab Results  Component Value Date   WBC 8.1 03/14/2015   HGB 15.2 03/14/2015   HCT 45.1 03/14/2015   PLT 184 03/14/2015   GLUCOSE 115* 03/14/2015   ALT 10* 12/31/2014   AST 19 12/31/2014   NA 129* 03/14/2015   K 4.4 03/14/2015   CL 94* 03/14/2015   CREATININE 0.90 03/14/2015   BUN 14 03/14/2015   CO2 28 03/14/2015   TSH 3.385 12/30/2014   INR 1.85* 12/28/2014   HGBA1C 5.7* 12/25/2014      Assessment / Plan:   progressing following cabg to start cardiac rehabilitation next week  persistent Left pleural effusion but less than previously,  not tolerating diarrhetic   encouraged to start cardiac rehab soon Return 4  Weeks with chest x-ray to evaluate his effusion that is not continuing to improve will repeat thoracentesis    [name] MD      [address] [place] [address] Office [phone]   Beeper [phone]  03/25/2015 2:41 PM

## 2015-03-25 NOTE — Progress Notes (Signed)
[address]       [place] [address]             [id number]      Jacob Reyes Medical Record #[id number] Date of Birth: 11-05-1925  Referring: Teodoro Spray, MD Primary Care: Rusty Aus., MD  Chief Complaint:   POST OP FOLLOW UP 12/28/2014 OPERATIVE REPORT PREOPERATIVE DIAGNOSIS: Severe 3-vessel coronary artery disease. POSTOPERATIVE DIAGNOSIS: Severe 3-vessel coronary artery disease. SURGICAL PROCEDURE: Coronary artery bypass grafting x6, with left internal mammary to the left anterior descending coronary artery in 2 sites, in the mid vessel and the distal LAD, reverse saphenous vein graft to the diagonal, sequential reverse saphenous vein graft to the first and second obtuse marginal, and reverse saphenous vein graft to the mid posterior descending coronary artery, with right leg greater saphenous vein harvesting from the thigh and calf. SURGEON: Lanelle Bal, M.D.  History of Present Illness:     Patient making reasonable progression postop. He's now back at home from skilled nursing facility and is to start in cardiac rehabilitation next week. Patient unaware of any recurrent AFIB. He had significant fluid retention postoperatively especially in his lower extremities this is improved and because of generalized weakness his Lasix was discontinued, . He is increasing his activity.     Past Medical History  Diagnosis Date  . Glaucoma   . Hearing loss   . Reflux   . GERD (gastroesophageal reflux disease)   . Cancer [facility])     prostate  . Colon cancer ([place])   . Coronary artery disease   . Hypertension   . Abnormal stress test   . Depression   . Anxiety     just started med. for anxiety   . History of hiatal hernia      History  Smoking status  . Never Smoker   Smokeless tobacco  . Never Used    History  Alcohol Use No     No Known Allergies  Current Outpatient Prescriptions  Medication Sig Dispense  Refill  . aspirin EC 81 MG tablet Take 81 mg by mouth daily.    . brimonidine-timolol (COMBIGAN) 0.2-0.5 % ophthalmic solution Place 1 drop into both eyes 2 (two) times daily.    . cholecalciferol (VITAMIN D) 1000 UNITS tablet Take 1,000 Units by mouth daily.    . finasteride (PROSCAR) 5 MG tablet Take 5 mg by mouth daily after supper.     . levothyroxine (SYNTHROID, LEVOTHROID) 75 MCG tablet Take 75 mcg by mouth daily before breakfast.     . lovastatin (MEVACOR) 40 MG tablet Take 40 mg by mouth daily after supper.     Marland Kitchen LUMIGAN 0.01 % SOLN Place 1 drop into both eyes at bedtime.     . magnesium chloride (SLOW-MAG) 64 MG TBEC SR tablet Take 2 tablets (128 mg total) by mouth daily. 60 tablet   . Melatonin 5 MG TABS Take 5 mg by mouth at bedtime as needed (sleep).    Marland Kitchen omeprazole (PRILOSEC) 20 MG capsule Take 20 mg by mouth daily before breakfast.     . polyethylene glycol powder (GLYCOLAX/MIRALAX) powder Take 17 g by mouth daily.     . tamsulosin (FLOMAX) 0.4 MG CAPS capsule Take 0.4 mg by mouth 2 (two) times daily.      No current facility-administered medications for this visit.       Physical Exam: BP 124/90 mmHg  Pulse 96  Resp 20  Ht 6\' 1"  (1.854 m)  Wt 192 lb (87.091 kg)  BMI 25.34 kg/m2  SpO2 97%  General appearance: alert, cooperative, appears stated age and no distress Neurologic: intact Heart: regular rate and rhythm, S1, S2 normal, no murmur, click, rub or gallop Lungs: diminished breath sounds LLL Abdomen: soft, non-tender; bowel sounds normal; no masses,  no organomegaly Extremities: extremities normal, atraumatic, no cyanosis or edema Wound: sternum satble healing well, still with bilaterial pedal edema, right thigh healing well but still with right thigh swelling the lower extremity edema is markedly improved   Diagnostic Studies & Laboratory data:     Recent Radiology Findings:   Dg Chest 2 View  03/25/2015  CLINICAL DATA:  Coronary artery disease.  Pleural  effusion. EXAM: CHEST  2 VIEW COMPARISON:  03/08/2015.  02/25/2015. FINDINGS: Mediastinum and hilar structures normal. Prior CABG. Heart size normal. Left base atelectasis. Recurrent moderate left-sided pleural effusion. No acute bony abnormality . IMPRESSION: 1. Recurrent moderate left-sided pleural effusion. 2. Left base subsegmental atelectasis. 3. Prior CABG.  Heart size stable. Electronically Signed   By: Marcello Moores  Register   On: 03/25/2015 13:15      Recent Lab Findings: Lab Results  Component Value Date   WBC 8.1 03/14/2015   HGB 15.2 03/14/2015   HCT 45.1 03/14/2015   PLT 184 03/14/2015   GLUCOSE 115* 03/14/2015   ALT 10* 12/31/2014   AST 19 12/31/2014   NA 129* 03/14/2015   K 4.4 03/14/2015   CL 94* 03/14/2015   CREATININE 0.90 03/14/2015   BUN 14 03/14/2015   CO2 28 03/14/2015   TSH 3.385 12/30/2014   INR 1.85* 12/28/2014   HGBA1C 5.7* 12/25/2014      Assessment / Plan:   progressing following cabg to started   Left pleural effusion present, and not tolerating diarrhetic , with hyponatremia ultrasound-guided left thoracentesis  done with follow-up chest x-ray showing some accumulation of the left effusion but not as much as previously present encouraged to resume cardiac rehab soon Return 4  Weeks with chest x-ray    [name] MD      [address] [place] [address] Office [phone]   Beeper [phone]  03/25/2015 2:51 PM

## 2015-03-29 ENCOUNTER — Encounter: Payer: Medicare Other | Admitting: *Deleted

## 2015-03-29 DIAGNOSIS — Z951 Presence of aortocoronary bypass graft: Secondary | ICD-10-CM | POA: Diagnosis present

## 2015-03-29 NOTE — Progress Notes (Signed)
Daily Session Note  Patient Details  Name: GIORDANO GETMAN MRN: [id number] Date of Birth: [birth year] Referring Provider:  Teodoro Spray, MD  Encounter Date: 03/29/2015  Check In:     Session Check In - 03/29/15 1631    Check-In   Staff Present Heath Lark RN, BSN, CCRP;Hawley Michel RN, BSN;Steven Way BS, ACSM EP-C, Exercise Physiologist   ER physicians immediately available to respond to emergencies See telemetry face sheet for immediately available ER MD   Medication changes reported     No   Fall or balance concerns reported    Yes  [name] is using his walker today.    Warm-up and Cool-down Performed on first and last piece of equipment   VAD Patient? No   Pain Assessment   Currently in Pain? No/denies         Goals Met:  Proper associated with RPD/PD & O2 Sat Exercise tolerated well No report of cardiac concerns or symptoms  Goals Unmet:  Not Applicable  Goals Comments:    Dr. Emily Filbert is Medical Director for [facility].

## 2015-03-29 NOTE — Progress Notes (Signed)
Cardiac Individual Treatment Plan  Patient Details  Name: Jacob Reyes MRN: [id number] Date of Birth: December 20, 1925 Referring Provider:  Teodoro Spray, MD  Initial Encounter Date:    Visit Diagnosis: S/P CABG x 6  Patient's Home Medications on Admission:  Current outpatient prescriptions:  .  aspirin EC 81 MG tablet, Take 81 mg by mouth daily., Disp: , Rfl:  .  brimonidine-timolol (COMBIGAN) 0.2-0.5 % ophthalmic solution, Place 1 drop into both eyes 2 (two) times daily., Disp: , Rfl:  .  cholecalciferol (VITAMIN D) 1000 UNITS tablet, Take 1,000 Units by mouth daily., Disp: , Rfl:  .  finasteride (PROSCAR) 5 MG tablet, Take 5 mg by mouth daily after supper. , Disp: , Rfl:  .  levothyroxine (SYNTHROID, LEVOTHROID) 75 MCG tablet, Take 75 mcg by mouth daily before breakfast. , Disp: , Rfl:  .  lovastatin (MEVACOR) 40 MG tablet, Take 40 mg by mouth daily after supper. , Disp: , Rfl:  .  LUMIGAN 0.01 % SOLN, Place 1 drop into both eyes at bedtime. , Disp: , Rfl:  .  magnesium chloride (SLOW-MAG) 64 MG TBEC SR tablet, Take 2 tablets (128 mg total) by mouth daily., Disp: 60 tablet, Rfl:  .  Melatonin 5 MG TABS, Take 5 mg by mouth at bedtime as needed (sleep)., Disp: , Rfl:  .  omeprazole (PRILOSEC) 20 MG capsule, Take 20 mg by mouth daily before breakfast. , Disp: , Rfl:  .  polyethylene glycol powder (GLYCOLAX/MIRALAX) powder, Take 17 g by mouth daily. , Disp: , Rfl:  .  tamsulosin (FLOMAX) 0.4 MG CAPS capsule, Take 0.4 mg by mouth 2 (two) times daily. , Disp: , Rfl:   Past Medical History: Past Medical History  Diagnosis Date  . Glaucoma   . Hearing loss   . Reflux   . GERD (gastroesophageal reflux disease)   . Cancer [facility])     prostate  . Colon cancer ([place])   . Coronary artery disease   . Hypertension   . Abnormal stress test   . Depression   . Anxiety     just started med. for anxiety   . History of hiatal hernia     Tobacco Use: History  Smoking status  . Never Smoker    Smokeless tobacco  . Never Used    Labs: Recent Review Flowsheet Data    Labs for ITP Cardiac and Pulmonary Rehab Latest Ref Rng 12/28/2014 12/28/2014 12/29/2014 12/29/2014 12/29/2014   PHART 7.350 - 7.450 - 7.452(H) 7.411 7.418 -   PCO2ART 35.0 - 45.0 mmHg - 35.2 32.5(L) 31.2(L) -   HCO3 20.0 - 24.0 mEq/L - 24.5(H) 20.7 20.2 -   TCO2 0 - 100 mmol/L 21 26 22 21 20    ACIDBASEDEF 0.0 - 2.0 mmol/L - - 4.0(H) 4.0(H) -   O2SAT - - 99.0 96.0 93.0 -       Exercise Target Goals:    Exercise Program Goal: Individual exercise prescription set with THRR, safety & activity barriers. Participant demonstrates ability to understand and report RPE using BORG scale, to self-measure pulse accurately, and to acknowledge the importance of the exercise prescription.  Exercise Prescription Goal: Starting with aerobic activity 30 plus minutes a day, 3 days per week for initial exercise prescription. Provide home exercise prescription and guidelines that participant acknowledges understanding prior to discharge.  Activity Barriers & Risk Stratification:     Activity Barriers & Risk Stratification - 02/23/15 1105    Activity Barriers & Risk Stratification  Activity Barriers None   Risk Stratification High      6 Minute Walk:     6 Minute Walk      02/23/15 1438       6 Minute Walk   Phase Initial     Distance 650 feet     Walk Time 6 minutes     Resting HR 84 bpm     Resting BP 110/70 mmHg     Max Ex. HR 110 bpm     Max Ex. BP 132/62 mmHg     RPE 12     Symptoms No        Initial Exercise Prescription:     Initial Exercise Prescription - 02/23/15 1400    Date of Initial Exercise Prescription   Date 02/23/15   Treadmill   MPH 1.2   Grade 0   Minutes 10  Use intervals to complete time goals if necessary   Bike   Level 0.2   Minutes 10   Recumbant Bike   Level 3   RPM 40   Watts 25   Minutes 15   NuStep   Level 2   Watts 30   Minutes 15   Arm Ergometer   Level 1   Watts  8   Minutes 10   Arm/Foot Ergometer   Level 4   Watts 15   Minutes 10   Cybex   Level 1   RPM 50   Minutes 10   Recumbant Elliptical   Level 1   RPM 40   Watts 10   Minutes 10   Elliptical   Level 1   Speed 3   Minutes 1   REL-XR   Level 2   Watts 30   Minutes 15   Prescription Details   Frequency (times per week) 3   Duration Progress to 30 minutes of continuous aerobic without signs/symptoms of physical distress   Intensity   THRR REST +  30   Ratings of Perceived Exertion 11-15   Progression Continue progressive overload as per policy without signs/symptoms or physical distress.   Resistance Training   Training Prescription Yes   Weight 2   Reps 10-15      Exercise Prescription Changes:     Exercise Prescription Changes      03/10/15 1600 03/17/15 1800 03/22/15 1200       Exercise Review   Progression   Yes     Response to Exercise   Blood Pressure (Admit)   118/78 mmHg     Blood Pressure (Exercise)   158/80 mmHg     Blood Pressure (Exit)   132/84 mmHg     Heart Rate (Admit)   76 bpm     Heart Rate (Exercise)   98 bpm     Heart Rate (Exit)   81 bpm     Rating of Perceived Exertion (Exercise)   13     Symptoms   None     Comments   Reviewed individualized exercise prescription and made increases per departmental policy. Exercise increases were discussed with the patient and they were able to perform the new work loads without issue (no signs or symptoms).      Duration   Progress to 30 minutes of continuous aerobic without signs/symptoms of physical distress     Intensity   Rest + 30     Progression   Continue progressive overload as per policy without signs/symptoms or physical distress.  Resistance Training   Training Prescription   Yes     Weight   3     Reps   10-15     Interval Training   Interval Training   No     Recumbant Elliptical   Level 4 5 5      Watts 25 30 30      Minutes 30 30 30         Discharge Exercise Prescription (Final  Exercise Prescription Changes):     Exercise Prescription Changes - 03/22/15 1200    Exercise Review   Progression Yes   Response to Exercise   Blood Pressure (Admit) 118/78 mmHg   Blood Pressure (Exercise) 158/80 mmHg   Blood Pressure (Exit) 132/84 mmHg   Heart Rate (Admit) 76 bpm   Heart Rate (Exercise) 98 bpm   Heart Rate (Exit) 81 bpm   Rating of Perceived Exertion (Exercise) 13   Symptoms None   Comments Reviewed individualized exercise prescription and made increases per departmental policy. Exercise increases were discussed with the patient and they were able to perform the new work loads without issue (no signs or symptoms).    Duration Progress to 30 minutes of continuous aerobic without signs/symptoms of physical distress   Intensity Rest + 30   Progression Continue progressive overload as per policy without signs/symptoms or physical distress.   Resistance Training   Training Prescription Yes   Weight 3   Reps 10-15   Interval Training   Interval Training No   Recumbant Elliptical   Level 5   Watts 30   Minutes 30      Nutrition:  Target Goals: Understanding of nutrition guidelines, daily intake of sodium 1500mg , cholesterol 200mg , calories 30% from fat and 7% or less from saturated fats, daily to have 5 or more servings of fruits and vegetables.  Biometrics:     Pre Biometrics - 02/23/15 1437    Pre Biometrics   Height 6' 0.2" (1.834 m)   Weight 185 lb 12.8 oz (84.278 kg)   Waist Circumference 40.5 inches   Hip Circumference 44 inches   Waist to Hip Ratio 0.92 %   BMI (Calculated) 25.1       Nutrition Therapy Plan and Nutrition Goals:     Nutrition Therapy & Goals - 02/26/15 1124    Nutrition Therapy   Diet Patient declined MNT at this time.       Nutrition Discharge: Rate Your Plate Scores:     Rate Your Plate - 62/70/35 0093    Rate Your Plate Scores   Pre Score --  Not done by patient age 47      Nutrition Goals Re-Evaluation:      Nutrition Goals Re-Evaluation      03/11/15 1726 03/29/15 1632         Personal Goal #1 Re-Evaluation   Personal Goal #1 Kennyth Lose cont to eat well.  Is now willing to meet individually with the registered dietician.       Goal Progress Seen Yes Yes         Psychosocial: Target Goals: Acknowledge presence or absence of depression, maximize coping skills, provide positive support system. Participant is able to verbalize types and ability to use techniques and skills needed for reducing stress and depression.  Initial Review & Psychosocial Screening:     Initial Psych Review & Screening - 02/23/15 1109    Initial Review   Current issues with Current Sleep Concerns   Family Dynamics   Good  Support System? Yes   Comments Discussed maybe sleeping in recliner since his upper chest has some burning where he removed an artery to use. Dr. Jobie Quaker told them some people have that and sit in waiting room with their shirts open to not touch that site. (patient reports)      Quality of Life Scores:     Quality of Life - 02/23/15 1506    Quality of Life Scores   Health/Function Pre --  Not done by patient. Jacob Reyes said he is 15 and really doesn't have any major problems at his age and his wife agreed.       PHQ-9:     Recent Review Flowsheet Data    Depression screen [facility] 2/9 02/23/2015   Decreased Interest 0   Down, Depressed, Hopeless 0   PHQ - 2 Score 0   Altered sleeping 3   Tired, decreased energy 0   Change in appetite 0   Feeling bad or failure about yourself  0   Trouble concentrating 0   Moving slowly or fidgety/restless 0   Suicidal thoughts 0   PHQ-9 Score 3      Psychosocial Evaluation and Intervention:   Psychosocial Re-Evaluation:     Psychosocial Re-Evaluation      03/11/15 1728 03/29/15 1635         Psychosocial Re-Evaluation   Interventions Encouraged to attend Cardiac Rehabilitation for the exercise Encouraged to attend Cardiac Rehabilitation for the  exercise      Comments Jacob Reyes has a $40 copay each time he attends Cardiac Rehab. He did not say he wanted to leave yet and he mentioned the $40 copay.  Stressed due to passing out at home on 10/24 and in [place] seen later follow up by Dr. Sabra Heck.       Continued Psychosocial Services Needed Yes          Vocational Rehabilitation: Provide vocational rehab assistance to qualifying candidates.   Vocational Rehab Evaluation & Intervention:     Vocational Rehab - 02/23/15 1107    Initial Vocational Rehab Evaluation & Intervention   Assessment shows need for Vocational Rehabilitation No      Education: Education Goals: Education classes will be provided on a weekly basis, covering required topics. Participant will state understanding/return demonstration of topics presented.  Learning Barriers/Preferences:     Learning Barriers/Preferences - 02/23/15 1503    Learning Barriers/Preferences   Learning Barriers Hearing      Education Topics: General Nutrition Guidelines/Fats and Fiber: -Group instruction provided by verbal, written material, models and posters to present the general guidelines for heart healthy nutrition. Gives an explanation and review of dietary fats and fiber.          Cardiac Rehab from 03/01/2015 in [facility] Cardiac Rehab   Date  03/01/15   Educator  PI   Instruction Review Code  2- meets goals/outcomes      Controlling Sodium/Reading Food Labels: -Group verbal and written material supporting the discussion of sodium use in heart healthy nutrition. Review and explanation with models, verbal and written materials for utilization of the food label.   Exercise Physiology & Risk Factors: - Group verbal and written instruction with models to review the exercise physiology of the cardiovascular system and associated critical values. Details cardiovascular disease risk factors and the goals associated with each risk factor.   Aerobic Exercise & Resistance  Training: - Gives group verbal and written discussion on the health impact of inactivity. On the components of  aerobic and resistive training programs and the benefits of this training and how to safely progress through these programs.   Flexibility, Balance, General Exercise Guidelines: - Provides group verbal and written instruction on the benefits of flexibility and balance training programs. Provides general exercise guidelines with specific guidelines to those with heart or lung disease. Demonstration and skill practice provided.   Stress Management: - Provides group verbal and written instruction about the health risks of elevated stress, cause of high stress, and healthy ways to reduce stress.   Depression: - Provides group verbal and written instruction on the correlation between heart/lung disease and depressed mood, treatment options, and the stigmas associated with seeking treatment.   Anatomy & Physiology of the Heart: - Group verbal and written instruction and models provide basic cardiac anatomy and physiology, with the coronary electrical and arterial systems. Review of: AMI, Angina, Valve disease, Heart Failure, Cardiac Arrhythmia, Pacemakers, and the ICD.   Cardiac Procedures: - Group verbal and written instruction and models to describe the testing methods done to diagnose heart disease. Reviews the outcomes of the test results. Describes the treatment choices: Medical Management, Angioplasty, or Coronary Bypass Surgery.   Cardiac Medications: - Group verbal and written instruction to review commonly prescribed medications for heart disease. Reviews the medication, class of the drug, and side effects. Includes the steps to properly store meds and maintain the prescription regimen.   Go Sex-Intimacy & Heart Disease, Get SMART - Goal Setting: - Group verbal and written instruction through game format to discuss heart disease and the return to sexual intimacy. Provides  group verbal and written material to discuss and apply goal setting through the application of the S.M.A.R.T. Method.   Other Matters of the Heart: - Provides group verbal, written materials and models to describe Heart Failure, Angina, Valve Disease, and Diabetes in the realm of heart disease. Includes description of the disease process and treatment options available to the cardiac patient.   Exercise & Equipment Safety: - Individual verbal instruction and demonstration of equipment use and safety with use of the equipment.      Cardiac Rehab from 03/01/2015 in [facility] Cardiac Rehab   Date  02/23/15   Educator  C. Alessandra Sawdey,RN   Instruction Review Code  1- partially meets, needs review/practice      Infection Prevention: - Provides verbal and written material to individual with discussion of infection control including proper hand washing and proper equipment cleaning during exercise session.      Cardiac Rehab from 03/01/2015 in [facility] Cardiac Rehab   Date  02/23/15   Educator  C. Jaimi Belle,RN   Instruction Review Code  2- meets goals/outcomes      Falls Prevention: - Provides verbal and written material to individual with discussion of falls prevention and safety.      Cardiac Rehab from 03/01/2015 in [facility] Cardiac Rehab   Date  02/23/15   Educator  C. Madsen Riddle,RN   Instruction Review Code  2- meets goals/outcomes      Diabetes: - Individual verbal and written instruction to review signs/symptoms of diabetes, desired ranges of glucose level fasting, after meals and with exercise. Advice that pre and post exercise glucose checks will be done for 3 sessions at entry of program.    Knowledge Questionnaire Score:     Knowledge Questionnaire Score - 02/23/15 1503    Knowledge Questionnaire Score   Pre Score Not done by patient      Personal Goals and Risk Factors at Admission:  Personal Goals and Risk Factors at Admission - 02/24/15 1410    Personal Goals and Risk  Factors on Admission    Weight Management No   Intervention Learn and follow the exercise and diet guidelines while in the program. Utilize the nutrition and education classes to help gain knowledge of the diet and exercise expectations in the program   Admit Weight 178 lb (80.74 kg)  Jacob Reyes reported Dr. Sabra Heck stopped his fluid pills since his weight was down 12 lbs and sodium was too low.    Goal Weight 190 lb (86.183 kg)      Personal Goals and Risk Factors Review:      Goals and Risk Factor Review      03/03/15 1837 03/11/15 1727 03/18/15 1705 03/29/15 1635     Increase Aerobic Exercise and Physical Activity   Goals Progress/Improvement seen  Yes Yes Yes     Comments  Jacob Reyes feels he isn't getting strength like he would like to but Adren said he did not like taking the Paxil that the doctor suggested. Jacob Reyes reports that he has another MD appt with Dr. Emily Filbert next week.  Jacob Reyes says his wife wants him to get more strength but he did alot on REL today and got tired. Is still dizzy at times so kept him off treadmill.  Stressed due to passing out at home on 10/24 and in [place] seen later follow up by Dr. Sabra Heck.        Personal Goals Discharge (Final Personal Goals and Risk Factors Review):      Goals and Risk Factor Review - 03/29/15 1635    Increase Aerobic Exercise and Physical Activity   Comments Stressed due to passing out at home on 10/24 and in [facility] seen later follow up by Dr. Sabra Heck.        Comments: Stressed due to passing out at home on 10/24 and in [place] seen later follow up by Dr. Sabra Heck.

## 2015-03-30 ENCOUNTER — Encounter: Payer: Self-pay | Admitting: *Deleted

## 2015-03-30 DIAGNOSIS — Z951 Presence of aortocoronary bypass graft: Secondary | ICD-10-CM

## 2015-03-30 NOTE — Progress Notes (Signed)
Cardiac Individual Treatment Plan  Patient Details  Name: Jacob Reyes MRN: [id number] Date of Birth: [birth year] Referring Provider:  Teodoro Spray, MD  Initial Encounter Date:    Visit Diagnosis: S/P CABG x 6  Patient's Home Medications on Admission:  Current outpatient prescriptions:  .  aspirin EC 81 MG tablet, Take 81 mg by mouth daily., Disp: , Rfl:  .  brimonidine-timolol (COMBIGAN) 0.2-0.5 % ophthalmic solution, Place 1 drop into both eyes 2 (two) times daily., Disp: , Rfl:  .  cholecalciferol (VITAMIN D) 1000 UNITS tablet, Take 1,000 Units by mouth daily., Disp: , Rfl:  .  finasteride (PROSCAR) 5 MG tablet, Take 5 mg by mouth daily after supper. , Disp: , Rfl:  .  levothyroxine (SYNTHROID, LEVOTHROID) 75 MCG tablet, Take 75 mcg by mouth daily before breakfast. , Disp: , Rfl:  .  lovastatin (MEVACOR) 40 MG tablet, Take 40 mg by mouth daily after supper. , Disp: , Rfl:  .  LUMIGAN 0.01 % SOLN, Place 1 drop into both eyes at bedtime. , Disp: , Rfl:  .  magnesium chloride (SLOW-MAG) 64 MG TBEC SR tablet, Take 2 tablets (128 mg total) by mouth daily., Disp: 60 tablet, Rfl:  .  Melatonin 5 MG TABS, Take 5 mg by mouth at bedtime as needed (sleep)., Disp: , Rfl:  .  omeprazole (PRILOSEC) 20 MG capsule, Take 20 mg by mouth daily before breakfast. , Disp: , Rfl:  .  polyethylene glycol powder (GLYCOLAX/MIRALAX) powder, Take 17 g by mouth daily. , Disp: , Rfl:  .  tamsulosin (FLOMAX) 0.4 MG CAPS capsule, Take 0.4 mg by mouth 2 (two) times daily. , Disp: , Rfl:   Past Medical History: Past Medical History  Diagnosis Date  . Glaucoma   . Hearing loss   . Reflux   . GERD (gastroesophageal reflux disease)   . Cancer [facility])     prostate  . Colon cancer ([place])   . Coronary artery disease   . Hypertension   . Abnormal stress test   . Depression   . Anxiety     just started med. for anxiety   . History of hiatal hernia     Tobacco Use: History  Smoking status  . Never Smoker    Smokeless tobacco  . Never Used    Labs: Recent Review Flowsheet Data    Labs for ITP Cardiac and Pulmonary Rehab Latest Ref Rng 12/28/2014 12/28/2014 12/29/2014 12/29/2014 12/29/2014   PHART 7.350 - 7.450 - 7.452(H) 7.411 7.418 -   PCO2ART 35.0 - 45.0 mmHg - 35.2 32.5(L) 31.2(L) -   HCO3 20.0 - 24.0 mEq/L - 24.5(H) 20.7 20.2 -   TCO2 0 - 100 mmol/L 21 26 22 21 20    ACIDBASEDEF 0.0 - 2.0 mmol/L - - 4.0(H) 4.0(H) -   O2SAT - - 99.0 96.0 93.0 -       Exercise Target Goals:    Exercise Program Goal: Individual exercise prescription set with THRR, safety & activity barriers. Participant demonstrates ability to understand and report RPE using BORG scale, to self-measure pulse accurately, and to acknowledge the importance of the exercise prescription.  Exercise Prescription Goal: Starting with aerobic activity 30 plus minutes a day, 3 days per week for initial exercise prescription. Provide home exercise prescription and guidelines that participant acknowledges understanding prior to discharge.  Activity Barriers & Risk Stratification:     Activity Barriers & Risk Stratification - 02/23/15 1105    Activity Barriers & Risk Stratification  Activity Barriers None   Risk Stratification High      6 Minute Walk:     6 Minute Walk      02/23/15 1438       6 Minute Walk   Phase Initial     Distance 650 feet     Walk Time 6 minutes     Resting HR 84 bpm     Resting BP 110/70 mmHg     Max Ex. HR 110 bpm     Max Ex. BP 132/62 mmHg     RPE 12     Symptoms No        Initial Exercise Prescription:     Initial Exercise Prescription - 02/23/15 1400    Date of Initial Exercise Prescription   Date 02/23/15   Treadmill   MPH 1.2   Grade 0   Minutes 10  Use intervals to complete time goals if necessary   Bike   Level 0.2   Minutes 10   Recumbant Bike   Level 3   RPM 40   Watts 25   Minutes 15   NuStep   Level 2   Watts 30   Minutes 15   Arm Ergometer   Level 1   Watts  8   Minutes 10   Arm/Foot Ergometer   Level 4   Watts 15   Minutes 10   Cybex   Level 1   RPM 50   Minutes 10   Recumbant Elliptical   Level 1   RPM 40   Watts 10   Minutes 10   Elliptical   Level 1   Speed 3   Minutes 1   REL-XR   Level 2   Watts 30   Minutes 15   Prescription Details   Frequency (times per week) 3   Duration Progress to 30 minutes of continuous aerobic without signs/symptoms of physical distress   Intensity   THRR REST +  30   Ratings of Perceived Exertion 11-15   Progression Continue progressive overload as per policy without signs/symptoms or physical distress.   Resistance Training   Training Prescription Yes   Weight 2   Reps 10-15      Exercise Prescription Changes:     Exercise Prescription Changes      03/10/15 1600 03/17/15 1800 03/22/15 1200       Exercise Review   Progression   Yes     Response to Exercise   Blood Pressure (Admit)   118/78 mmHg     Blood Pressure (Exercise)   158/80 mmHg     Blood Pressure (Exit)   132/84 mmHg     Heart Rate (Admit)   76 bpm     Heart Rate (Exercise)   98 bpm     Heart Rate (Exit)   81 bpm     Rating of Perceived Exertion (Exercise)   13     Symptoms   None     Comments   Reviewed individualized exercise prescription and made increases per departmental policy. Exercise increases were discussed with the patient and they were able to perform the new work loads without issue (no signs or symptoms).      Duration   Progress to 30 minutes of continuous aerobic without signs/symptoms of physical distress     Intensity   Rest + 30     Progression   Continue progressive overload as per policy without signs/symptoms or physical distress.  Resistance Training   Training Prescription   Yes     Weight   3     Reps   10-15     Interval Training   Interval Training   No     Recumbant Elliptical   Level 4 5 5      Watts 25 30 30      Minutes 30 30 30         Discharge Exercise Prescription (Final  Exercise Prescription Changes):     Exercise Prescription Changes - 03/22/15 1200    Exercise Review   Progression Yes   Response to Exercise   Blood Pressure (Admit) 118/78 mmHg   Blood Pressure (Exercise) 158/80 mmHg   Blood Pressure (Exit) 132/84 mmHg   Heart Rate (Admit) 76 bpm   Heart Rate (Exercise) 98 bpm   Heart Rate (Exit) 81 bpm   Rating of Perceived Exertion (Exercise) 13   Symptoms None   Comments Reviewed individualized exercise prescription and made increases per departmental policy. Exercise increases were discussed with the patient and they were able to perform the new work loads without issue (no signs or symptoms).    Duration Progress to 30 minutes of continuous aerobic without signs/symptoms of physical distress   Intensity Rest + 30   Progression Continue progressive overload as per policy without signs/symptoms or physical distress.   Resistance Training   Training Prescription Yes   Weight 3   Reps 10-15   Interval Training   Interval Training No   Recumbant Elliptical   Level 5   Watts 30   Minutes 30      Nutrition:  Target Goals: Understanding of nutrition guidelines, daily intake of sodium 1500mg , cholesterol 200mg , calories 30% from fat and 7% or less from saturated fats, daily to have 5 or more servings of fruits and vegetables.  Biometrics:     Pre Biometrics - 02/23/15 1437    Pre Biometrics   Height 6' 0.2" (1.834 m)   Weight 185 lb 12.8 oz (84.278 kg)   Waist Circumference 40.5 inches   Hip Circumference 44 inches   Waist to Hip Ratio 0.92 %   BMI (Calculated) 25.1       Nutrition Therapy Plan and Nutrition Goals:     Nutrition Therapy & Goals - 02/26/15 1124    Nutrition Therapy   Diet Patient declined MNT at this time.       Nutrition Discharge: Rate Your Plate Scores:     Rate Your Plate - 17/79/39 0300    Rate Your Plate Scores   Pre Score --  Not done by patient age 34      Nutrition Goals Re-Evaluation:      Nutrition Goals Re-Evaluation      03/11/15 1726 03/29/15 1632         Personal Goal #1 Re-Evaluation   Personal Goal #1 Kennyth Lose cont to eat well.  Is now willing to meet individually with the registered dietician.       Goal Progress Seen Yes Yes         Psychosocial: Target Goals: Acknowledge presence or absence of depression, maximize coping skills, provide positive support system. Participant is able to verbalize types and ability to use techniques and skills needed for reducing stress and depression.  Initial Review & Psychosocial Screening:     Initial Psych Review & Screening - 02/23/15 1109    Initial Review   Current issues with Current Sleep Concerns   Family Dynamics   Good  Support System? Yes   Comments Discussed maybe sleeping in recliner since his upper chest has some burning where he removed an artery to use. Dr. Jobie Quaker told them some people have that and sit in waiting room with their shirts open to not touch that site. (patient reports)      Quality of Life Scores:     Quality of Life - 02/23/15 1506    Quality of Life Scores   Health/Function Pre --  Not done by patient. [name] said he is 67 and really doesn't have any major problems at his age and his wife agreed.       PHQ-9:     Recent Review Flowsheet Data    Depression screen [facility] 2/9 02/23/2015   Decreased Interest 0   Down, Depressed, Hopeless 0   PHQ - 2 Score 0   Altered sleeping 3   Tired, decreased energy 0   Change in appetite 0   Feeling bad or failure about yourself  0   Trouble concentrating 0   Moving slowly or fidgety/restless 0   Suicidal thoughts 0   PHQ-9 Score 3      Psychosocial Evaluation and Intervention:   Psychosocial Re-Evaluation:     Psychosocial Re-Evaluation      03/11/15 1728 03/29/15 1635         Psychosocial Re-Evaluation   Interventions Encouraged to attend Cardiac Rehabilitation for the exercise Encouraged to attend Cardiac Rehabilitation for the  exercise      Comments Babatunde has a $40 copay each time he attends Cardiac Rehab. He did not say he wanted to leave yet and he mentioned the $40 copay.  Stressed due to passing out at home on 10/24 and in [place] seen later follow up by Dr. Sabra Heck.       Continued Psychosocial Services Needed Yes          Vocational Rehabilitation: Provide vocational rehab assistance to qualifying candidates.   Vocational Rehab Evaluation & Intervention:     Vocational Rehab - 02/23/15 1107    Initial Vocational Rehab Evaluation & Intervention   Assessment shows need for Vocational Rehabilitation No      Education: Education Goals: Education classes will be provided on a weekly basis, covering required topics. Participant will state understanding/return demonstration of topics presented.  Learning Barriers/Preferences:     Learning Barriers/Preferences - 02/23/15 1503    Learning Barriers/Preferences   Learning Barriers Hearing      Education Topics: General Nutrition Guidelines/Fats and Fiber: -Group instruction provided by verbal, written material, models and posters to present the general guidelines for heart healthy nutrition. Gives an explanation and review of dietary fats and fiber.          Cardiac Rehab from 03/01/2015 in [facility] Cardiac Rehab   Date  03/01/15   Educator  PI   Instruction Review Code  2- meets goals/outcomes      Controlling Sodium/Reading Food Labels: -Group verbal and written material supporting the discussion of sodium use in heart healthy nutrition. Review and explanation with models, verbal and written materials for utilization of the food label.   Exercise Physiology & Risk Factors: - Group verbal and written instruction with models to review the exercise physiology of the cardiovascular system and associated critical values. Details cardiovascular disease risk factors and the goals associated with each risk factor.   Aerobic Exercise & Resistance  Training: - Gives group verbal and written discussion on the health impact of inactivity. On the components of  aerobic and resistive training programs and the benefits of this training and how to safely progress through these programs.   Flexibility, Balance, General Exercise Guidelines: - Provides group verbal and written instruction on the benefits of flexibility and balance training programs. Provides general exercise guidelines with specific guidelines to those with heart or lung disease. Demonstration and skill practice provided.   Stress Management: - Provides group verbal and written instruction about the health risks of elevated stress, cause of high stress, and healthy ways to reduce stress.   Depression: - Provides group verbal and written instruction on the correlation between heart/lung disease and depressed mood, treatment options, and the stigmas associated with seeking treatment.   Anatomy & Physiology of the Heart: - Group verbal and written instruction and models provide basic cardiac anatomy and physiology, with the coronary electrical and arterial systems. Review of: AMI, Angina, Valve disease, Heart Failure, Cardiac Arrhythmia, Pacemakers, and the ICD.   Cardiac Procedures: - Group verbal and written instruction and models to describe the testing methods done to diagnose heart disease. Reviews the outcomes of the test results. Describes the treatment choices: Medical Management, Angioplasty, or Coronary Bypass Surgery.   Cardiac Medications: - Group verbal and written instruction to review commonly prescribed medications for heart disease. Reviews the medication, class of the drug, and side effects. Includes the steps to properly store meds and maintain the prescription regimen.   Go Sex-Intimacy & Heart Disease, Get SMART - Goal Setting: - Group verbal and written instruction through game format to discuss heart disease and the return to sexual intimacy. Provides  group verbal and written material to discuss and apply goal setting through the application of the S.M.A.R.T. Method.   Other Matters of the Heart: - Provides group verbal, written materials and models to describe Heart Failure, Angina, Valve Disease, and Diabetes in the realm of heart disease. Includes description of the disease process and treatment options available to the cardiac patient.   Exercise & Equipment Safety: - Individual verbal instruction and demonstration of equipment use and safety with use of the equipment.      Cardiac Rehab from 03/01/2015 in [facility] Cardiac Rehab   Date  02/23/15   Educator  C. Enterkin,RN   Instruction Review Code  1- partially meets, needs review/practice      Infection Prevention: - Provides verbal and written material to individual with discussion of infection control including proper hand washing and proper equipment cleaning during exercise session.      Cardiac Rehab from 03/01/2015 in [facility] Cardiac Rehab   Date  02/23/15   Educator  C. Enterkin,RN   Instruction Review Code  2- meets goals/outcomes      Falls Prevention: - Provides verbal and written material to individual with discussion of falls prevention and safety.      Cardiac Rehab from 03/01/2015 in [facility] Cardiac Rehab   Date  02/23/15   Educator  C. Enterkin,RN   Instruction Review Code  2- meets goals/outcomes      Diabetes: - Individual verbal and written instruction to review signs/symptoms of diabetes, desired ranges of glucose level fasting, after meals and with exercise. Advice that pre and post exercise glucose checks will be done for 3 sessions at entry of program.    Knowledge Questionnaire Score:     Knowledge Questionnaire Score - 02/23/15 1503    Knowledge Questionnaire Score   Pre Score Not done by patient      Personal Goals and Risk Factors at Admission:  Personal Goals and Risk Factors at Admission - 02/24/15 1410    Personal Goals and Risk  Factors on Admission    Weight Management No   Intervention Learn and follow the exercise and diet guidelines while in the program. Utilize the nutrition and education classes to help gain knowledge of the diet and exercise expectations in the program   Admit Weight 178 lb (80.74 kg)  [name] reported Dr. Sabra Heck stopped his fluid pills since his weight was down 12 lbs and sodium was too low.    Goal Weight 190 lb (86.183 kg)      Personal Goals and Risk Factors Review:      Goals and Risk Factor Review      03/03/15 1837 03/11/15 1727 03/18/15 1705 03/29/15 1635 03/29/15 1758   Increase Aerobic Exercise and Physical Activity   Goals Progress/Improvement seen  Yes Yes Yes  Yes   Comments  Lathyn feels he isn't getting strength like he would like to but Ireoluwa said he did not like taking the Paxil that the doctor suggested. Harkirat reports that he has another MD appt with Dr. Emily Filbert next week.  [name] says his wife wants him to get more strength but he did alot on REL today and got tired. Is still dizzy at times so kept him off treadmill.  Stressed due to passing out at home on 10/24 and in [place] seen later follow up by Dr. Sabra Heck.  Did 30 minutes of exercise but [name] mentioned he was worried about passing out so I took him to his car in a wheelchair for his wife to drive him home. During exercising sitting on the Nustep his blood pressure dropped to 90/60 but came up to 120/70 with 240 cc of water he drank for me.       Personal Goals Discharge (Final Personal Goals and Risk Factors Review):      Goals and Risk Factor Review - 03/29/15 1758    Increase Aerobic Exercise and Physical Activity   Goals Progress/Improvement seen  Yes   Comments Did 30 minutes of exercise but [name] mentioned he was worried about passing out so I took him to his car in a wheelchair for his wife to drive him home. During exercising sitting on the Nustep his blood pressure dropped to 90/60 but came up to 120/70  with 240 cc of water he drank for me.        Comments:  30 day review. Continue with ITP.

## 2015-03-31 ENCOUNTER — Other Ambulatory Visit: Payer: Self-pay | Admitting: *Deleted

## 2015-03-31 DIAGNOSIS — Z951 Presence of aortocoronary bypass graft: Secondary | ICD-10-CM

## 2015-04-01 DIAGNOSIS — Z951 Presence of aortocoronary bypass graft: Secondary | ICD-10-CM | POA: Diagnosis not present

## 2015-04-01 NOTE — Progress Notes (Signed)
Daily Session Note  Patient Details  Name: Jacob Reyes MRN: [id number] Date of Birth: [birth year] Referring Provider:  Teodoro Spray, MD  Encounter Date: 04/01/2015  Check In:     Session Check In - 04/01/15 1635    Check-In   Staff Present Gerlene Burdock RN, BSN;Steven Way BS, ACSM EP-C, Exercise Physiologist;Diane Mariana Arn, BSN   ER physicians immediately available to respond to emergencies See telemetry face sheet for immediately available ER MD   Medication changes reported     No   Fall or balance concerns reported    No   Warm-up and Cool-down Performed on first and last piece of equipment   VAD Patient? No   Pain Assessment   Currently in Pain? No/denies         Goals Met:  Proper associated with RPD/PD & O2 Sat Exercise tolerated well  Goals Unmet:  Not Applicable  Goals Comments: [name] reports that Dr. Emily Filbert told him to eat sodium since his sodium levels are low.    Dr. Emily Filbert is Medical Director for [facility].

## 2015-04-01 NOTE — Progress Notes (Signed)
Cardiac Individual Treatment Plan  Patient Details  Name: Jacob Reyes MRN: [id number] Date of Birth: December 20, 1925 Referring Provider:  Teodoro Spray, MD  Initial Encounter Date:    Visit Diagnosis: S/P CABG x 6  Patient's Home Medications on Admission:  Current outpatient prescriptions:  .  aspirin EC 81 MG tablet, Take 81 mg by mouth daily., Disp: , Rfl:  .  brimonidine-timolol (COMBIGAN) 0.2-0.5 % ophthalmic solution, Place 1 drop into both eyes 2 (two) times daily., Disp: , Rfl:  .  cholecalciferol (VITAMIN D) 1000 UNITS tablet, Take 1,000 Units by mouth daily., Disp: , Rfl:  .  finasteride (PROSCAR) 5 MG tablet, Take 5 mg by mouth daily after supper. , Disp: , Rfl:  .  levothyroxine (SYNTHROID, LEVOTHROID) 75 MCG tablet, Take 75 mcg by mouth daily before breakfast. , Disp: , Rfl:  .  lovastatin (MEVACOR) 40 MG tablet, Take 40 mg by mouth daily after supper. , Disp: , Rfl:  .  LUMIGAN 0.01 % SOLN, Place 1 drop into both eyes at bedtime. , Disp: , Rfl:  .  magnesium chloride (SLOW-MAG) 64 MG TBEC SR tablet, Take 2 tablets (128 mg total) by mouth daily., Disp: 60 tablet, Rfl:  .  Melatonin 5 MG TABS, Take 5 mg by mouth at bedtime as needed (sleep)., Disp: , Rfl:  .  omeprazole (PRILOSEC) 20 MG capsule, Take 20 mg by mouth daily before breakfast. , Disp: , Rfl:  .  polyethylene glycol powder (GLYCOLAX/MIRALAX) powder, Take 17 g by mouth daily. , Disp: , Rfl:  .  tamsulosin (FLOMAX) 0.4 MG CAPS capsule, Take 0.4 mg by mouth 2 (two) times daily. , Disp: , Rfl:   Past Medical History: Past Medical History  Diagnosis Date  . Glaucoma   . Hearing loss   . Reflux   . GERD (gastroesophageal reflux disease)   . Cancer [facility])     prostate  . Colon cancer ([place])   . Coronary artery disease   . Hypertension   . Abnormal stress test   . Depression   . Anxiety     just started med. for anxiety   . History of hiatal hernia     Tobacco Use: History  Smoking status  . Never Smoker    Smokeless tobacco  . Never Used    Labs: Recent Review Flowsheet Data    Labs for ITP Cardiac and Pulmonary Rehab Latest Ref Rng 12/28/2014 12/28/2014 12/29/2014 12/29/2014 12/29/2014   PHART 7.350 - 7.450 - 7.452(H) 7.411 7.418 -   PCO2ART 35.0 - 45.0 mmHg - 35.2 32.5(L) 31.2(L) -   HCO3 20.0 - 24.0 mEq/L - 24.5(H) 20.7 20.2 -   TCO2 0 - 100 mmol/L 21 26 22 21 20    ACIDBASEDEF 0.0 - 2.0 mmol/L - - 4.0(H) 4.0(H) -   O2SAT - - 99.0 96.0 93.0 -       Exercise Target Goals:    Exercise Program Goal: Individual exercise prescription set with THRR, safety & activity barriers. Participant demonstrates ability to understand and report RPE using BORG scale, to self-measure pulse accurately, and to acknowledge the importance of the exercise prescription.  Exercise Prescription Goal: Starting with aerobic activity 30 plus minutes a day, 3 days per week for initial exercise prescription. Provide home exercise prescription and guidelines that participant acknowledges understanding prior to discharge.  Activity Barriers & Risk Stratification:     Activity Barriers & Risk Stratification - 02/23/15 1105    Activity Barriers & Risk Stratification  Activity Barriers None   Risk Stratification High      6 Minute Walk:     6 Minute Walk      02/23/15 1438       6 Minute Walk   Phase Initial     Distance 650 feet     Walk Time 6 minutes     Resting HR 84 bpm     Resting BP 110/70 mmHg     Max Ex. HR 110 bpm     Max Ex. BP 132/62 mmHg     RPE 12     Symptoms No        Initial Exercise Prescription:     Initial Exercise Prescription - 02/23/15 1400    Date of Initial Exercise Prescription   Date 02/23/15   Treadmill   MPH 1.2   Grade 0   Minutes 10  Use intervals to complete time goals if necessary   Bike   Level 0.2   Minutes 10   Recumbant Bike   Level 3   RPM 40   Watts 25   Minutes 15   NuStep   Level 2   Watts 30   Minutes 15   Arm Ergometer   Level 1   Watts  8   Minutes 10   Arm/Foot Ergometer   Level 4   Watts 15   Minutes 10   Cybex   Level 1   RPM 50   Minutes 10   Recumbant Elliptical   Level 1   RPM 40   Watts 10   Minutes 10   Elliptical   Level 1   Speed 3   Minutes 1   REL-XR   Level 2   Watts 30   Minutes 15   Prescription Details   Frequency (times per week) 3   Duration Progress to 30 minutes of continuous aerobic without signs/symptoms of physical distress   Intensity   THRR REST +  30   Ratings of Perceived Exertion 11-15   Progression Continue progressive overload as per policy without signs/symptoms or physical distress.   Resistance Training   Training Prescription Yes   Weight 2   Reps 10-15      Exercise Prescription Changes:     Exercise Prescription Changes      03/10/15 1600 03/17/15 1800 03/22/15 1200       Exercise Review   Progression   Yes     Response to Exercise   Blood Pressure (Admit)   118/78 mmHg     Blood Pressure (Exercise)   158/80 mmHg     Blood Pressure (Exit)   132/84 mmHg     Heart Rate (Admit)   76 bpm     Heart Rate (Exercise)   98 bpm     Heart Rate (Exit)   81 bpm     Rating of Perceived Exertion (Exercise)   13     Symptoms   None     Comments   Reviewed individualized exercise prescription and made increases per departmental policy. Exercise increases were discussed with the patient and they were able to perform the new work loads without issue (no signs or symptoms).      Duration   Progress to 30 minutes of continuous aerobic without signs/symptoms of physical distress     Intensity   Rest + 30     Progression   Continue progressive overload as per policy without signs/symptoms or physical distress.  Resistance Training   Training Prescription   Yes     Weight   3     Reps   10-15     Interval Training   Interval Training   No     Recumbant Elliptical   Level 4 5 5      Watts 25 30 30      Minutes 30 30 30         Discharge Exercise Prescription (Final  Exercise Prescription Changes):     Exercise Prescription Changes - 03/22/15 1200    Exercise Review   Progression Yes   Response to Exercise   Blood Pressure (Admit) 118/78 mmHg   Blood Pressure (Exercise) 158/80 mmHg   Blood Pressure (Exit) 132/84 mmHg   Heart Rate (Admit) 76 bpm   Heart Rate (Exercise) 98 bpm   Heart Rate (Exit) 81 bpm   Rating of Perceived Exertion (Exercise) 13   Symptoms None   Comments Reviewed individualized exercise prescription and made increases per departmental policy. Exercise increases were discussed with the patient and they were able to perform the new work loads without issue (no signs or symptoms).    Duration Progress to 30 minutes of continuous aerobic without signs/symptoms of physical distress   Intensity Rest + 30   Progression Continue progressive overload as per policy without signs/symptoms or physical distress.   Resistance Training   Training Prescription Yes   Weight 3   Reps 10-15   Interval Training   Interval Training No   Recumbant Elliptical   Level 5   Watts 30   Minutes 30      Nutrition:  Target Goals: Understanding of nutrition guidelines, daily intake of sodium 1500mg , cholesterol 200mg , calories 30% from fat and 7% or less from saturated fats, daily to have 5 or more servings of fruits and vegetables.  Biometrics:     Pre Biometrics - 02/23/15 1437    Pre Biometrics   Height 6' 0.2" (1.834 m)   Weight 185 lb 12.8 oz (84.278 kg)   Waist Circumference 40.5 inches   Hip Circumference 44 inches   Waist to Hip Ratio 0.92 %   BMI (Calculated) 25.1       Nutrition Therapy Plan and Nutrition Goals:     Nutrition Therapy & Goals - 02/26/15 1124    Nutrition Therapy   Diet Patient declined MNT at this time.       Nutrition Discharge: Rate Your Plate Scores:     Rate Your Plate - 62/70/35 0093    Rate Your Plate Scores   Pre Score --  Not done by patient age 47      Nutrition Goals Re-Evaluation:      Nutrition Goals Re-Evaluation      03/11/15 1726 03/29/15 1632         Personal Goal #1 Re-Evaluation   Personal Goal #1 Jacob Reyes cont to eat well.  Is now willing to meet individually with the registered dietician.       Goal Progress Seen Yes Yes         Psychosocial: Target Goals: Acknowledge presence or absence of depression, maximize coping skills, provide positive support system. Participant is able to verbalize types and ability to use techniques and skills needed for reducing stress and depression.  Initial Review & Psychosocial Screening:     Initial Psych Review & Screening - 02/23/15 1109    Initial Review   Current issues with Current Sleep Concerns   Family Dynamics   Good  Support System? Yes   Comments Discussed maybe sleeping in recliner since his upper chest has some burning where he removed an artery to use. Dr. Jobie Quaker told them some people have that and sit in waiting room with their shirts open to not touch that site. (patient reports)      Quality of Life Scores:     Quality of Life - 02/23/15 1506    Quality of Life Scores   Health/Function Pre --  Not done by patient. Jacob Reyes said he is 67 and really doesn't have any major problems at his age and his wife agreed.       PHQ-9:     Recent Review Flowsheet Data    Depression screen [facility] 2/9 02/23/2015   Decreased Interest 0   Down, Depressed, Hopeless 0   PHQ - 2 Score 0   Altered sleeping 3   Tired, decreased energy 0   Change in appetite 0   Feeling bad or failure about yourself  0   Trouble concentrating 0   Moving slowly or fidgety/restless 0   Suicidal thoughts 0   PHQ-9 Score 3      Psychosocial Evaluation and Intervention:   Psychosocial Re-Evaluation:     Psychosocial Re-Evaluation      03/11/15 1728 03/29/15 1635         Psychosocial Re-Evaluation   Interventions Encouraged to attend Cardiac Rehabilitation for the exercise Encouraged to attend Cardiac Rehabilitation for the  exercise      Comments Jacob Reyes has a $40 copay each time he attends Cardiac Rehab. He did not say he wanted to leave yet and he mentioned the $40 copay.  Stressed due to passing out at home on 10/24 and in [place] seen later follow up by Dr. Sabra Heck.       Continued Psychosocial Services Needed Yes          Vocational Rehabilitation: Provide vocational rehab assistance to qualifying candidates.   Vocational Rehab Evaluation & Intervention:     Vocational Rehab - 02/23/15 1107    Initial Vocational Rehab Evaluation & Intervention   Assessment shows need for Vocational Rehabilitation No      Education: Education Goals: Education classes will be provided on a weekly basis, covering required topics. Participant will state understanding/return demonstration of topics presented.  Learning Barriers/Preferences:     Learning Barriers/Preferences - 02/23/15 1503    Learning Barriers/Preferences   Learning Barriers Hearing      Education Topics: General Nutrition Guidelines/Fats and Fiber: -Group instruction provided by verbal, written material, models and posters to present the general guidelines for heart healthy nutrition. Gives an explanation and review of dietary fats and fiber.          Cardiac Rehab from 03/01/2015 in [facility] Cardiac Rehab   Date  03/01/15   Educator  PI   Instruction Review Code  2- meets goals/outcomes      Controlling Sodium/Reading Food Labels: -Group verbal and written material supporting the discussion of sodium use in heart healthy nutrition. Review and explanation with models, verbal and written materials for utilization of the food label.   Exercise Physiology & Risk Factors: - Group verbal and written instruction with models to review the exercise physiology of the cardiovascular system and associated critical values. Details cardiovascular disease risk factors and the goals associated with each risk factor.   Aerobic Exercise & Resistance  Training: - Gives group verbal and written discussion on the health impact of inactivity. On the components of  aerobic and resistive training programs and the benefits of this training and how to safely progress through these programs.   Flexibility, Balance, General Exercise Guidelines: - Provides group verbal and written instruction on the benefits of flexibility and balance training programs. Provides general exercise guidelines with specific guidelines to those with heart or lung disease. Demonstration and skill practice provided.   Stress Management: - Provides group verbal and written instruction about the health risks of elevated stress, cause of high stress, and healthy ways to reduce stress.   Depression: - Provides group verbal and written instruction on the correlation between heart/lung disease and depressed mood, treatment options, and the stigmas associated with seeking treatment.   Anatomy & Physiology of the Heart: - Group verbal and written instruction and models provide basic cardiac anatomy and physiology, with the coronary electrical and arterial systems. Review of: AMI, Angina, Valve disease, Heart Failure, Cardiac Arrhythmia, Pacemakers, and the ICD.   Cardiac Procedures: - Group verbal and written instruction and models to describe the testing methods done to diagnose heart disease. Reviews the outcomes of the test results. Describes the treatment choices: Medical Management, Angioplasty, or Coronary Bypass Surgery.   Cardiac Medications: - Group verbal and written instruction to review commonly prescribed medications for heart disease. Reviews the medication, class of the drug, and side effects. Includes the steps to properly store meds and maintain the prescription regimen.   Go Sex-Intimacy & Heart Disease, Get SMART - Goal Setting: - Group verbal and written instruction through game format to discuss heart disease and the return to sexual intimacy. Provides  group verbal and written material to discuss and apply goal setting through the application of the S.M.A.R.T. Method.   Other Matters of the Heart: - Provides group verbal, written materials and models to describe Heart Failure, Angina, Valve Disease, and Diabetes in the realm of heart disease. Includes description of the disease process and treatment options available to the cardiac patient.   Exercise & Equipment Safety: - Individual verbal instruction and demonstration of equipment use and safety with use of the equipment.      Cardiac Rehab from 03/01/2015 in [facility] Cardiac Rehab   Date  02/23/15   Educator  C. Fumie Fiallo,RN   Instruction Review Code  1- partially meets, needs review/practice      Infection Prevention: - Provides verbal and written material to individual with discussion of infection control including proper hand washing and proper equipment cleaning during exercise session.      Cardiac Rehab from 03/01/2015 in [facility] Cardiac Rehab   Date  02/23/15   Educator  C. Aryan Bello,RN   Instruction Review Code  2- meets goals/outcomes      Falls Prevention: - Provides verbal and written material to individual with discussion of falls prevention and safety.      Cardiac Rehab from 03/01/2015 in [facility] Cardiac Rehab   Date  02/23/15   Educator  C. Sven Pinheiro,RN   Instruction Review Code  2- meets goals/outcomes      Diabetes: - Individual verbal and written instruction to review signs/symptoms of diabetes, desired ranges of glucose level fasting, after meals and with exercise. Advice that pre and post exercise glucose checks will be done for 3 sessions at entry of program.    Knowledge Questionnaire Score:     Knowledge Questionnaire Score - 02/23/15 1503    Knowledge Questionnaire Score   Pre Score Not done by patient      Personal Goals and Risk Factors at Admission:  Personal Goals and Risk Factors at Admission - 02/24/15 1410    Personal Goals and Risk  Factors on Admission    Weight Management No   Intervention Learn and follow the exercise and diet guidelines while in the program. Utilize the nutrition and education classes to help gain knowledge of the diet and exercise expectations in the program   Admit Weight 178 lb (80.74 kg)  Jacob Reyes reported Dr. Sabra Heck stopped his fluid pills since his weight was down 12 lbs and sodium was too low.    Goal Weight 190 lb (86.183 kg)      Personal Goals and Risk Factors Review:      Goals and Risk Factor Review      03/03/15 1837 03/11/15 1727 03/18/15 1705 03/29/15 1635 03/29/15 1758   Increase Aerobic Exercise and Physical Activity   Goals Progress/Improvement seen  Yes Yes Yes  Yes   Comments  Jacob Reyes feels he isn't getting strength like he would like to but Jacob Reyes said he did not like taking the Paxil that the doctor suggested. Jacob Reyes reports that he has another MD appt with Dr. Emily Filbert next week.  Jacob Reyes says his wife wants him to get more strength but he did alot on REL today and got tired. Is still dizzy at times so kept him off treadmill.  Stressed due to passing out at home on 10/24 and in [place] seen later follow up by Dr. Sabra Heck.  Did 30 minutes of exercise but Jacob Reyes mentioned he was worried about passing out so I took him to his car in a wheelchair for his wife to drive him home. During exercising sitting on the Nustep his blood pressure dropped to 90/60 but came up to 120/70 with 240 cc of water he drank for me.       Personal Goals Discharge (Final Personal Goals and Risk Factors Review):      Goals and Risk Factor Review - 03/29/15 1758    Increase Aerobic Exercise and Physical Activity   Goals Progress/Improvement seen  Yes   Comments Did 30 minutes of exercise but Jacob Reyes mentioned he was worried about passing out so I took him to his car in a wheelchair for his wife to drive him home. During exercising sitting on the Nustep his blood pressure dropped to 90/60 but came up to 120/70  with 240 cc of water he drank for me.        Comments: "Jacob Reyes reports Dr. Emily Filbert said his sodium levels are low and he is suppose to eat things with sodium. Also on a Fluid restriction.

## 2015-04-01 NOTE — Progress Notes (Signed)
Cardiac Individual Treatment Plan  Patient Details  Name: Jacob Reyes MRN: [id number] Date of Birth: July 79, 1927 Referring Provider:  Teodoro Spray, MD  Initial Encounter Date: 79    Visit Diagnosis: S/P CABG x 6  Patient's Home Medications on Admission:  Current outpatient prescriptions:  .  aspirin EC 81 MG tablet, Take 81 mg by mouth daily., Disp: , Rfl:  .  brimonidine-timolol (COMBIGAN) 0.2-0.5 % ophthalmic solution, Place 1 drop into both eyes 2 (two) times daily., Disp: , Rfl:  .  cholecalciferol (VITAMIN D) 1000 UNITS tablet, Take 1,000 Units by mouth daily., Disp: , Rfl:  .  finasteride (PROSCAR) 5 MG tablet, Take 5 mg by mouth daily after supper. , Disp: , Rfl:  .  levothyroxine (SYNTHROID, LEVOTHROID) 75 MCG tablet, Take 75 mcg by mouth daily before breakfast. , Disp: , Rfl:  .  lovastatin (MEVACOR) 40 MG tablet, Take 40 mg by mouth daily after supper. , Disp: , Rfl:  .  LUMIGAN 0.01 % SOLN, Place 1 drop into both eyes at bedtime. , Disp: , Rfl:  .  magnesium chloride (SLOW-MAG) 64 MG TBEC SR tablet, Take 2 tablets (128 mg total) by mouth daily., Disp: 60 tablet, Rfl:  .  Melatonin 5 MG TABS, Take 5 mg by mouth at bedtime as needed (sleep)., Disp: , Rfl:  .  omeprazole (PRILOSEC) 20 MG capsule, Take 20 mg by mouth daily before breakfast. , Disp: , Rfl:  .  polyethylene glycol powder (GLYCOLAX/MIRALAX) powder, Take 17 g by mouth daily. , Disp: , Rfl:  .  tamsulosin (FLOMAX) 0.4 MG CAPS capsule, Take 0.4 mg by mouth 2 (two) times daily. , Disp: , Rfl:   Past Medical History: Past Medical History  Diagnosis Date  . Glaucoma   . Hearing loss   . Reflux   . GERD (gastroesophageal reflux disease)   . Cancer [facility])     prostate  . Colon cancer ([place])   . Coronary artery disease   . Hypertension   . Abnormal stress test   . Depression   . Anxiety     just started med. for anxiety   . History of hiatal hernia     Tobacco Use: History  Smoking status  . Never Smoker    Smokeless tobacco  . Never Used    Labs: Recent Review Flowsheet Data    Labs for ITP Cardiac and Pulmonary Rehab Latest Ref Rng 12/28/2014 12/28/2014 12/29/2014 12/29/2014 12/29/2014   PHART 7.350 - 7.450 - 7.452(H) 7.411 7.418 -   PCO2ART 35.0 - 45.0 mmHg - 35.2 32.5(L) 31.2(L) -   HCO3 20.0 - 24.0 mEq/L - 24.5(H) 20.7 20.2 -   TCO2 0 - 100 mmol/L 21 26 22 21 20    ACIDBASEDEF 0.0 - 2.0 mmol/L - - 4.0(H) 4.0(H) -   O2SAT - - 99.0 96.0 93.0 -       Exercise Target Goals:    Exercise Program Goal: Individual exercise prescription set with THRR, safety & activity barriers. Participant demonstrates ability to understand and report RPE using BORG scale, to self-measure pulse accurately, and to acknowledge the importance of the exercise prescription.  Exercise Prescription Goal: Starting with aerobic activity 30 plus minutes a day, 3 days per week for initial exercise prescription. Provide home exercise prescription and guidelines that participant acknowledges understanding prior to discharge.  Activity Barriers & Risk Stratification:     Activity Barriers & Risk Stratification - 02/23/15 1105    Activity Barriers & Risk Stratification  Activity Barriers None   Risk Stratification High      6 Minute Walk:     6 Minute Walk      02/23/15 1438       6 Minute Walk   Phase Initial     Distance 650 feet     Walk Time 6 minutes     Resting HR 84 bpm     Resting BP 110/70 mmHg     Max Ex. HR 110 bpm     Max Ex. BP 132/62 mmHg     RPE 12     Symptoms No        Initial Exercise Prescription:     Initial Exercise Prescription - 02/23/15 1400    Date of Initial Exercise Prescription   Date 02/23/15   Treadmill   MPH 1.2   Grade 0   Minutes 10  Use intervals to complete time goals if necessary   Bike   Level 0.2   Minutes 10   Recumbant Bike   Level 3   RPM 40   Watts 25   Minutes 15   NuStep   Level 2   Watts 30   Minutes 15   Arm Ergometer   Level 1   Watts  8   Minutes 10   Arm/Foot Ergometer   Level 4   Watts 15   Minutes 10   Cybex   Level 1   RPM 50   Minutes 10   Recumbant Elliptical   Level 1   RPM 40   Watts 10   Minutes 10   Elliptical   Level 1   Speed 3   Minutes 1   REL-XR   Level 2   Watts 30   Minutes 15   Prescription Details   Frequency (times per week) 3   Duration Progress to 30 minutes of continuous aerobic without signs/symptoms of physical distress   Intensity   THRR REST +  30   Ratings of Perceived Exertion 11-15   Progression Continue progressive overload as per policy without signs/symptoms or physical distress.   Resistance Training   Training Prescription Yes   Weight 2   Reps 10-15      Exercise Prescription Changes:     Exercise Prescription Changes      03/10/15 1600 03/17/15 1800 03/22/15 1200       Exercise Review   Progression   Yes     Response to Exercise   Blood Pressure (Admit)   118/78 mmHg     Blood Pressure (Exercise)   158/80 mmHg     Blood Pressure (Exit)   132/84 mmHg     Heart Rate (Admit)   76 bpm     Heart Rate (Exercise)   98 bpm     Heart Rate (Exit)   81 bpm     Rating of Perceived Exertion (Exercise)   13     Symptoms   None     Comments   Reviewed individualized exercise prescription and made increases per departmental policy. Exercise increases were discussed with the patient and they were able to perform the new work loads without issue (no signs or symptoms).      Duration   Progress to 30 minutes of continuous aerobic without signs/symptoms of physical distress     Intensity   Rest + 30     Progression   Continue progressive overload as per policy without signs/symptoms or physical distress.  Resistance Training   Training Prescription   Yes     Weight   3     Reps   10-15     Interval Training   Interval Training   No     Recumbant Elliptical   Level 4 5 5      Watts 25 30 30      Minutes 30 30 30         Discharge Exercise Prescription (Final  Exercise Prescription Changes):     Exercise Prescription Changes - 03/22/15 1200    Exercise Review   Progression Yes   Response to Exercise   Blood Pressure (Admit) 118/78 mmHg   Blood Pressure (Exercise) 158/80 mmHg   Blood Pressure (Exit) 132/84 mmHg   Heart Rate (Admit) 76 bpm   Heart Rate (Exercise) 98 bpm   Heart Rate (Exit) 81 bpm   Rating of Perceived Exertion (Exercise) 13   Symptoms None   Comments Reviewed individualized exercise prescription and made increases per departmental policy. Exercise increases were discussed with the patient and they were able to perform the new work loads without issue (no signs or symptoms).    Duration Progress to 30 minutes of continuous aerobic without signs/symptoms of physical distress   Intensity Rest + 30   Progression Continue progressive overload as per policy without signs/symptoms or physical distress.   Resistance Training   Training Prescription Yes   Weight 3   Reps 10-15   Interval Training   Interval Training No   Recumbant Elliptical   Level 5   Watts 30   Minutes 30      Nutrition:  Target Goals: Understanding of nutrition guidelines, daily intake of sodium 1500mg , cholesterol 200mg , calories 30% from fat and 7% or less from saturated fats, daily to have 5 or more servings of fruits and vegetables.  Biometrics:     Pre Biometrics - 02/23/15 1437    Pre Biometrics   Height 6' 0.2" (1.834 m)   Weight 185 lb 12.8 oz (84.278 kg)   Waist Circumference 40.5 inches   Hip Circumference 44 inches   Waist to Hip Ratio 0.92 %   BMI (Calculated) 25.1       Nutrition Therapy Plan and Nutrition Goals:     Nutrition Therapy & Goals - 02/26/15 1124    Nutrition Therapy   Diet Patient declined MNT at this time.       Nutrition Discharge: Rate Your Plate Scores:     Rate Your Plate - [id number]    Rate Your Plate Scores   Pre Score --  Not done by patient age 77      Nutrition Goals Re-Evaluation:      Nutrition Goals Re-Evaluation      03/11/15 1726 03/29/15 1632 04/01/15 1647       Personal Goal #1 Re-Evaluation   Personal Goal #1 Kennyth Lose cont to eat well.  Is now willing to meet individually with the registered dietician.  "[name]" Olen now reports that Dr. Emily Filbert said his sodium level was low so he is suppose to eat foods high  in sodium. His wife reports that he is also on a strict fluid restriction that incl what he drinks taking his pills, any coffe he drinks etc.      Goal Progress Seen Yes Yes Yes        Psychosocial: Target Goals: Acknowledge presence or absence of depression, maximize coping skills, provide positive support system. Participant is able to verbalize types and  ability to use techniques and skills needed for reducing stress and depression.  Initial Review & Psychosocial Screening:     Initial Psych Review & Screening - 02/23/15 1109    Initial Review   Current issues with Current Sleep Concerns   Family Dynamics   Good Support System? Yes   Comments Discussed maybe sleeping in recliner since his upper chest has some burning where he removed an artery to use. Dr. Jobie Quaker told them some people have that and sit in waiting room with their shirts open to not touch that site. (patient reports)      Quality of Life Scores:     Quality of Life - 02/23/15 1506    Quality of Life Scores   Health/Function Pre --  Not done by patient. [name] said he is 54 and really doesn't have any major problems at his age and his wife agreed.       PHQ-9:     Recent Review Flowsheet Data    Depression screen [facility] 2/9 02/23/2015   Decreased Interest 0   Down, Depressed, Hopeless 0   PHQ - 2 Score 0   Altered sleeping 3   Tired, decreased energy 0   Change in appetite 0   Feeling bad or failure about yourself  0   Trouble concentrating 0   Moving slowly or fidgety/restless 0   Suicidal thoughts 0   PHQ-9 Score 3      Psychosocial Evaluation and  Intervention:   Psychosocial Re-Evaluation:     Psychosocial Re-Evaluation      03/11/15 1728 03/29/15 1635 04/01/15 1649       Psychosocial Re-Evaluation   Interventions Encouraged to attend Cardiac Rehabilitation for the exercise Encouraged to attend Cardiac Rehabilitation for the exercise Encouraged to attend Cardiac Rehabilitation for the exercise;Stress management education     Comments Kaesyn has a $40 copay each time he attends Cardiac Rehab. He did not say he wanted to leave yet and he mentioned the $40 copay.  Stressed due to passing out at home on 10/24 and in [place] seen later follow up by Dr. Sabra Heck.  Discussed with his wife self care for both of them since "we seem to have a doctors appt weekly and for months we have seen Dr. Emily Filbert weekly". Mrs. Lopezrodriguez drives for her 33 year old husband now. Mr. Preiser said he has had to miss some Cardiac Rehab since he has had so many doctors appts.     Continued Psychosocial Services Needed Yes  Yes        Vocational Rehabilitation: Provide vocational rehab assistance to qualifying candidates.   Vocational Rehab Evaluation & Intervention:     Vocational Rehab - 02/23/15 1107    Initial Vocational Rehab Evaluation & Intervention   Assessment shows need for Vocational Rehabilitation No      Education: Education Goals: Education classes will be provided on a weekly basis, covering required topics. Participant will state understanding/return demonstration of topics presented.  Learning Barriers/Preferences:     Learning Barriers/Preferences - 02/23/15 1503    Learning Barriers/Preferences   Learning Barriers Hearing      Education Topics: General Nutrition Guidelines/Fats and Fiber: -Group instruction provided by verbal, written material, models and posters to present the general guidelines for heart healthy nutrition. Gives an explanation and review of dietary fats and fiber.          Cardiac Rehab from 03/01/2015  in [facility] Cardiac Rehab   Date  03/01/15  Educator  PI   Instruction Review Code  2- meets goals/outcomes      Controlling Sodium/Reading Food Labels: -Group verbal and written material supporting the discussion of sodium use in heart healthy nutrition. Review and explanation with models, verbal and written materials for utilization of the food label.   Exercise Physiology & Risk Factors: - Group verbal and written instruction with models to review the exercise physiology of the cardiovascular system and associated critical values. Details cardiovascular disease risk factors and the goals associated with each risk factor.   Aerobic Exercise & Resistance Training: - Gives group verbal and written discussion on the health impact of inactivity. On the components of aerobic and resistive training programs and the benefits of this training and how to safely progress through these programs.   Flexibility, Balance, General Exercise Guidelines: - Provides group verbal and written instruction on the benefits of flexibility and balance training programs. Provides general exercise guidelines with specific guidelines to those with heart or lung disease. Demonstration and skill practice provided.   Stress Management: - Provides group verbal and written instruction about the health risks of elevated stress, cause of high stress, and healthy ways to reduce stress.   Depression: - Provides group verbal and written instruction on the correlation between heart/lung disease and depressed mood, treatment options, and the stigmas associated with seeking treatment.   Anatomy & Physiology of the Heart: - Group verbal and written instruction and models provide basic cardiac anatomy and physiology, with the coronary electrical and arterial systems. Review of: AMI, Angina, Valve disease, Heart Failure, Cardiac Arrhythmia, Pacemakers, and the ICD.   Cardiac Procedures: - Group verbal and written instruction  and models to describe the testing methods done to diagnose heart disease. Reviews the outcomes of the test results. Describes the treatment choices: Medical Management, Angioplasty, or Coronary Bypass Surgery.   Cardiac Medications: - Group verbal and written instruction to review commonly prescribed medications for heart disease. Reviews the medication, class of the drug, and side effects. Includes the steps to properly store meds and maintain the prescription regimen.   Go Sex-Intimacy & Heart Disease, Get SMART - Goal Setting: - Group verbal and written instruction through game format to discuss heart disease and the return to sexual intimacy. Provides group verbal and written material to discuss and apply goal setting through the application of the S.M.A.R.T. Method.   Other Matters of the Heart: - Provides group verbal, written materials and models to describe Heart Failure, Angina, Valve Disease, and Diabetes in the realm of heart disease. Includes description of the disease process and treatment options available to the cardiac patient.   Exercise & Equipment Safety: - Individual verbal instruction and demonstration of equipment use and safety with use of the equipment.      Cardiac Rehab from 03/01/2015 in [facility] Cardiac Rehab   Date  02/23/15   Educator  C. Derrick Orris,RN   Instruction Review Code  1- partially meets, needs review/practice      Infection Prevention: - Provides verbal and written material to individual with discussion of infection control including proper hand washing and proper equipment cleaning during exercise session.      Cardiac Rehab from 03/01/2015 in [facility] Cardiac Rehab   Date  02/23/15   Educator  C. Gennifer Potenza,RN   Instruction Review Code  2- meets goals/outcomes      Falls Prevention: - Provides verbal and written material to individual with discussion of falls prevention and safety.      Cardiac  Rehab from 03/01/2015 in [facility] Cardiac Rehab   Date   02/23/15   Educator  C. Lakeesha Fontanilla,RN   Instruction Review Code  2- meets goals/outcomes      Diabetes: - Individual verbal and written instruction to review signs/symptoms of diabetes, desired ranges of glucose level fasting, after meals and with exercise. Advice that pre and post exercise glucose checks will be done for 3 sessions at entry of program.    Knowledge Questionnaire Score:     Knowledge Questionnaire Score - 02/23/15 1503    Knowledge Questionnaire Score   Pre Score Not done by patient      Personal Goals and Risk Factors at Admission:     Personal Goals and Risk Factors at Admission - 02/24/15 1410    Personal Goals and Risk Factors on Admission    Weight Management No   Intervention Learn and follow the exercise and diet guidelines while in the program. Utilize the nutrition and education classes to help gain knowledge of the diet and exercise expectations in the program   Admit Weight 178 lb (80.74 kg)  [name] reported Dr. Sabra Heck stopped his fluid pills since his weight was down 12 lbs and sodium was too low.    Goal Weight 190 lb (86.183 kg)      Personal Goals and Risk Factors Review:      Goals and Risk Factor Review      03/03/15 1837 03/11/15 1727 03/18/15 1705 03/29/15 1635 03/29/15 1758   Increase Aerobic Exercise and Physical Activity   Goals Progress/Improvement seen  Yes Yes Yes  Yes   Comments  Ariston feels he isn't getting strength like he would like to but Khyle said he did not like taking the Paxil that the doctor suggested. Renley reports that he has another MD appt with Dr. Emily Filbert next week.  [name] says his wife wants him to get more strength but he did alot on REL today and got tired. Is still dizzy at times so kept him off treadmill.  Stressed due to passing out at home on 10/24 and in [place] seen later follow up by Dr. Sabra Heck.  Did 30 minutes of exercise but [name] mentioned he was worried about passing out so I took him to his car in a  wheelchair for his wife to drive him home. During exercising sitting on the Nustep his blood pressure dropped to 90/60 but came up to 120/70 with 240 cc of water he drank for me.      04/01/15 1637 04/01/15 1648         Increase Aerobic Exercise and Physical Activity   Goals Progress/Improvement seen  Yes Yes      Comments [name] reports that Dr. Emily Filbert is having him eat more sodium since his sodium levels are low.  Although [name] did not stay today to do weights, he did 30+ minutes exercising sitting on the Nustep..          Personal Goals Discharge (Final Personal Goals and Risk Factors Review):      Goals and Risk Factor Review - 04/01/15 1648    Increase Aerobic Exercise and Physical Activity   Goals Progress/Improvement seen  Yes   Comments Although [name] did not stay today to do weights, he did 30+ minutes exercising sitting on the Nustep..        Comments: Stressful time for [name] and his wife since he has so many doctors' appts. He is 81 years old.

## 2015-04-14 ENCOUNTER — Encounter: Payer: Self-pay | Admitting: *Deleted

## 2015-04-14 ENCOUNTER — Telehealth: Payer: Self-pay | Admitting: *Deleted

## 2015-04-14 DIAGNOSIS — Z951 Presence of aortocoronary bypass graft: Secondary | ICD-10-CM

## 2015-04-14 NOTE — Telephone Encounter (Signed)
Jacob Reyes called back and said Dr.Mark Sabra Heck suggested Physical therapy for him so he won't be in Cardiac Rehab for a while.

## 2015-04-14 NOTE — Progress Notes (Signed)
Cardiac Individual Treatment Plan  Patient Details  Name: Jacob Reyes MRN: [id number] Date of Birth: 06-Apr-1926 Referring Provider:  No ref. provider found  Initial Encounter Date:    Visit Diagnosis: S/P CABG x 6  Patient's Home Medications on Admission:  Current outpatient prescriptions:  .  aspirin EC 81 MG tablet, Take 81 mg by mouth daily., Disp: , Rfl:  .  brimonidine-timolol (COMBIGAN) 0.2-0.5 % ophthalmic solution, Place 1 drop into both eyes 2 (two) times daily., Disp: , Rfl:  .  cholecalciferol (VITAMIN D) 1000 UNITS tablet, Take 1,000 Units by mouth daily., Disp: , Rfl:  .  finasteride (PROSCAR) 5 MG tablet, Take 5 mg by mouth daily after supper. , Disp: , Rfl:  .  levothyroxine (SYNTHROID, LEVOTHROID) 75 MCG tablet, Take 75 mcg by mouth daily before breakfast. , Disp: , Rfl:  .  lovastatin (MEVACOR) 40 MG tablet, Take 40 mg by mouth daily after supper. , Disp: , Rfl:  .  LUMIGAN 0.01 % SOLN, Place 1 drop into both eyes at bedtime. , Disp: , Rfl:  .  magnesium chloride (SLOW-MAG) 64 MG TBEC SR tablet, Take 2 tablets (128 mg total) by mouth daily., Disp: 60 tablet, Rfl:  .  Melatonin 5 MG TABS, Take 5 mg by mouth at bedtime as needed (sleep)., Disp: , Rfl:  .  omeprazole (PRILOSEC) 20 MG capsule, Take 20 mg by mouth daily before breakfast. , Disp: , Rfl:  .  polyethylene glycol powder (GLYCOLAX/MIRALAX) powder, Take 17 g by mouth daily. , Disp: , Rfl:  .  tamsulosin (FLOMAX) 0.4 MG CAPS capsule, Take 0.4 mg by mouth 2 (two) times daily. , Disp: , Rfl:   Past Medical History: Past Medical History  Diagnosis Date  . Glaucoma   . Hearing loss   . Reflux   . GERD (gastroesophageal reflux disease)   . Cancer [facility])     prostate  . Colon cancer ([place])   . Coronary artery disease   . Hypertension   . Abnormal stress test   . Depression   . Anxiety     just started med. for anxiety   . History of hiatal hernia     Tobacco Use: History  Smoking status  . Never  Smoker   Smokeless tobacco  . Never Used    Labs: Recent Review Flowsheet Data    Labs for ITP Cardiac and Pulmonary Rehab Latest Ref Rng 12/28/2014 12/28/2014 12/29/2014 12/29/2014 12/29/2014   PHART 7.350 - 7.450 - 7.452(H) 7.411 7.418 -   PCO2ART 35.0 - 45.0 mmHg - 35.2 32.5(L) 31.2(L) -   HCO3 20.0 - 24.0 mEq/L - 24.5(H) 20.7 20.2 -   TCO2 0 - 100 mmol/L 21 26 22 21 20    ACIDBASEDEF 0.0 - 2.0 mmol/L - - 4.0(H) 4.0(H) -   O2SAT - - 99.0 96.0 93.0 -       Exercise Target Goals:    Exercise Program Goal: Individual exercise prescription set with THRR, safety & activity barriers. Participant demonstrates ability to understand and report RPE using BORG scale, to self-measure pulse accurately, and to acknowledge the importance of the exercise prescription.  Exercise Prescription Goal: Starting with aerobic activity 30 plus minutes a day, 3 days per week for initial exercise prescription. Provide home exercise prescription and guidelines that participant acknowledges understanding prior to discharge.  Activity Barriers & Risk Stratification:     Activity Barriers & Risk Stratification - 02/23/15 1105    Activity Barriers & Risk Stratification  Activity Barriers None   Risk Stratification High      6 Minute Walk:     6 Minute Walk      02/23/15 1438       6 Minute Walk   Phase Initial     Distance 650 feet     Walk Time 6 minutes     Resting HR 84 bpm     Resting BP 110/70 mmHg     Max Ex. HR 110 bpm     Max Ex. BP 132/62 mmHg     RPE 12     Symptoms No        Initial Exercise Prescription:     Initial Exercise Prescription - 02/23/15 1400    Date of Initial Exercise Prescription   Date 02/23/15   Treadmill   MPH 1.2   Grade 0   Minutes 10  Use intervals to complete time goals if necessary   Bike   Level 0.2   Minutes 10   Recumbant Bike   Level 3   RPM 40   Watts 25   Minutes 15   NuStep   Level 2   Watts 30   Minutes 15   Arm Ergometer   Level 1    Watts 8   Minutes 10   Arm/Foot Ergometer   Level 4   Watts 15   Minutes 10   Cybex   Level 1   RPM 50   Minutes 10   Recumbant Elliptical   Level 1   RPM 40   Watts 10   Minutes 10   Elliptical   Level 1   Speed 3   Minutes 1   REL-XR   Level 2   Watts 30   Minutes 15   Prescription Details   Frequency (times per week) 3   Duration Progress to 30 minutes of continuous aerobic without signs/symptoms of physical distress   Intensity   THRR REST +  30   Ratings of Perceived Exertion 11-15   Progression Continue progressive overload as per policy without signs/symptoms or physical distress.   Resistance Training   Training Prescription Yes   Weight 2   Reps 10-15      Exercise Prescription Changes:     Exercise Prescription Changes      03/10/15 1600 03/17/15 1800 03/22/15 1200       Exercise Review   Progression   Yes     Response to Exercise   Blood Pressure (Admit)   118/78 mmHg     Blood Pressure (Exercise)   158/80 mmHg     Blood Pressure (Exit)   132/84 mmHg     Heart Rate (Admit)   76 bpm     Heart Rate (Exercise)   98 bpm     Heart Rate (Exit)   81 bpm     Rating of Perceived Exertion (Exercise)   13     Symptoms   None     Comments   Reviewed individualized exercise prescription and made increases per departmental policy. Exercise increases were discussed with the patient and they were able to perform the new work loads without issue (no signs or symptoms).      Duration   Progress to 30 minutes of continuous aerobic without signs/symptoms of physical distress     Intensity   Rest + 30     Progression   Continue progressive overload as per policy without signs/symptoms or physical distress.  Resistance Training   Training Prescription   Yes     Weight   3     Reps   10-15     Interval Training   Interval Training   No     Recumbant Elliptical   Level 4 5 5      Watts 25 30 30      Minutes 30 30 30         Discharge Exercise Prescription  (Final Exercise Prescription Changes):     Exercise Prescription Changes - 03/22/15 1200    Exercise Review   Progression Yes   Response to Exercise   Blood Pressure (Admit) 118/78 mmHg   Blood Pressure (Exercise) 158/80 mmHg   Blood Pressure (Exit) 132/84 mmHg   Heart Rate (Admit) 76 bpm   Heart Rate (Exercise) 98 bpm   Heart Rate (Exit) 81 bpm   Rating of Perceived Exertion (Exercise) 13   Symptoms None   Comments Reviewed individualized exercise prescription and made increases per departmental policy. Exercise increases were discussed with the patient and they were able to perform the new work loads without issue (no signs or symptoms).    Duration Progress to 30 minutes of continuous aerobic without signs/symptoms of physical distress   Intensity Rest + 30   Progression Continue progressive overload as per policy without signs/symptoms or physical distress.   Resistance Training   Training Prescription Yes   Weight 3   Reps 10-15   Interval Training   Interval Training No   Recumbant Elliptical   Level 5   Watts 30   Minutes 30      Nutrition:  Target Goals: Understanding of nutrition guidelines, daily intake of sodium 1500mg , cholesterol 200mg , calories 30% from fat and 7% or less from saturated fats, daily to have 5 or more servings of fruits and vegetables.  Biometrics:     Pre Biometrics - 02/23/15 1437    Pre Biometrics   Height 6' 0.2" (1.834 m)   Weight 185 lb 12.8 oz (84.278 kg)   Waist Circumference 40.5 inches   Hip Circumference 44 inches   Waist to Hip Ratio 0.92 %   BMI (Calculated) 25.1       Nutrition Therapy Plan and Nutrition Goals:     Nutrition Therapy & Goals - 02/26/15 1124    Nutrition Therapy   Diet Patient declined MNT at this time.       Nutrition Discharge: Rate Your Plate Scores:     Rate Your Plate - [id number]    Rate Your Plate Scores   Pre Score --  Not done by patient age 81      Nutrition Goals  Re-Evaluation:     Nutrition Goals Re-Evaluation      03/11/15 1726 03/29/15 1632 04/01/15 1647 04/14/15 1509     Personal Goal #1 Re-Evaluation   Personal Goal #1 Kennyth Lose cont to eat well.  Is now willing to meet individually with the registered dietician.  "Jacob Reyes" Bland now reports that Dr. Emily Filbert said his sodium level was low so he is suppose to eat foods high  in sodium. His wife reports that he is also on a strict fluid restriction that incl what he drinks taking his pills, any coffe he drinks etc.      Goal Progress Seen Yes Yes Yes Yes    Comments    Still eats healthy Jacob Reyes said.        Psychosocial: Target Goals: Acknowledge presence or absence of depression, maximize  coping skills, provide positive support system. Participant is able to verbalize types and ability to use techniques and skills needed for reducing stress and depression.  Initial Review & Psychosocial Screening:     Initial Psych Review & Screening - 02/23/15 1109    Initial Review   Current issues with Current Sleep Concerns   Family Dynamics   Good Support System? Yes   Comments Discussed maybe sleeping in recliner since his upper chest has some burning where he removed an artery to use. Dr. Jobie Quaker told them some people have that and sit in waiting room with their shirts open to not touch that site. (patient reports)      Quality of Life Scores:     Quality of Life - 02/23/15 1506    Quality of Life Scores   Health/Function Pre --  Not done by patient. Jacob Reyes said he is 52 and really doesn't have any major problems at his age and his wife agreed.       PHQ-9:     Recent Review Flowsheet Data    Depression screen [facility] 2/9 02/23/2015   Decreased Interest 0   Down, Depressed, Hopeless 0   PHQ - 2 Score 0   Altered sleeping 3   Tired, decreased energy 0   Change in appetite 0   Feeling bad or failure about yourself  0   Trouble concentrating 0   Moving slowly or fidgety/restless 0   Suicidal  thoughts 0   PHQ-9 Score 3      Psychosocial Evaluation and Intervention:   Psychosocial Re-Evaluation:     Psychosocial Re-Evaluation      03/11/15 1728 03/29/15 1635 04/01/15 1649 04/14/15 1510     Psychosocial Re-Evaluation   Interventions Encouraged to attend Cardiac Rehabilitation for the exercise Encouraged to attend Cardiac Rehabilitation for the exercise Encouraged to attend Cardiac Rehabilitation for the exercise;Stress management education     Comments Rosemary has a $40 copay each time he attends Cardiac Rehab. He did not say he wanted to leave yet and he mentioned the $40 copay.  Stressed due to passing out at home on 10/24 and in [place] seen later follow up by Dr. Sabra Heck.  Discussed with his wife self care for both of them since "we seem to have a doctors appt weekly and for months we have seen Dr. Emily Filbert weekly". Mrs. Biba drives for her 37 year old husband now. Mr. Zervas said he has had to miss some Cardiac Rehab since he has had so many doctors appts. Jacob Reyes and his wife said it has been stressful to go to MD alot to figure out what is wrong with him.     Continued Psychosocial Services Needed Yes  Yes        Vocational Rehabilitation: Provide vocational rehab assistance to qualifying candidates.   Vocational Rehab Evaluation & Intervention:     Vocational Rehab - 02/23/15 1107    Initial Vocational Rehab Evaluation & Intervention   Assessment shows need for Vocational Rehabilitation No      Education: Education Goals: Education classes will be provided on a weekly basis, covering required topics. Participant will state understanding/return demonstration of topics presented.  Learning Barriers/Preferences:     Learning Barriers/Preferences - 02/23/15 1503    Learning Barriers/Preferences   Learning Barriers Hearing      Education Topics: General Nutrition Guidelines/Fats and Fiber: -Group instruction provided by verbal, written material, models  and posters to present the general guidelines for heart healthy  nutrition. Gives an explanation and review of dietary fats and fiber.          Cardiac Rehab from 03/01/2015 in [facility]iac Rehab   Date  03/01/15   Educator  PI   Instruction Review Code  2- meets goals/outcomes      Controlling Sodium/Reading Food Labels: -Group verbal and written material supporting the discussion of sodium use in heart healthy nutrition. Review and explanation with models, verbal and written materials for utilization of the food label.   Exercise Physiology & Risk Factors: - Group verbal and written instruction with models to review the exercise physiology of the cardiovascular system and associated critical values. Details cardiovascular disease risk factors and the goals associated with each risk factor.   Aerobic Exercise & Resistance Training: - Gives group verbal and written discussion on the health impact of inactivity. On the components of aerobic and resistive training programs and the benefits of this training and how to safely progress through these programs.   Flexibility, Balance, General Exercise Guidelines: - Provides group verbal and written instruction on the benefits of flexibility and balance training programs. Provides general exercise guidelines with specific guidelines to those with heart or lung disease. Demonstration and skill practice provided.   Stress Management: - Provides group verbal and written instruction about the health risks of elevated stress, cause of high stress, and healthy ways to reduce stress.   Depression: - Provides group verbal and written instruction on the correlation between heart/lung disease and depressed mood, treatment options, and the stigmas associated with seeking treatment.   Anatomy & Physiology of the Heart: - Group verbal and written instruction and models provide basic cardiac anatomy and physiology, with the coronary electrical and  arterial systems. Review of: AMI, Angina, Valve disease, Heart Failure, Cardiac Arrhythmia, Pacemakers, and the ICD.   Cardiac Procedures: - Group verbal and written instruction and models to describe the testing methods done to diagnose heart disease. Reviews the outcomes of the test results. Describes the treatment choices: Medical Management, Angioplasty, or Coronary Bypass Surgery.   Cardiac Medications: - Group verbal and written instruction to review commonly prescribed medications for heart disease. Reviews the medication, class of the drug, and side effects. Includes the steps to properly store meds and maintain the prescription regimen.   Go Sex-Intimacy & Heart Disease, Get SMART - Goal Setting: - Group verbal and written instruction through game format to discuss heart disease and the return to sexual intimacy. Provides group verbal and written material to discuss and apply goal setting through the application of the S.M.A.R.T. Method.   Other Matters of the Heart: - Provides group verbal, written materials and models to describe Heart Failure, Angina, Valve Disease, and Diabetes in the realm of heart disease. Includes description of the disease process and treatment options available to the cardiac patient.   Exercise & Equipment Safety: - Individual verbal instruction and demonstration of equipment use and safety with use of the equipment.      Cardiac Rehab from 03/01/2015 in [facility] Cardiac Rehab   Date  02/23/15   Educator  C. Nastacia Raybuck,RN   Instruction Review Code  1- partially meets, needs review/practice      Infection Prevention: - Provides verbal and written material to individual with discussion of infection control including proper hand washing and proper equipment cleaning during exercise session.      Cardiac Rehab from 03/01/2015 in [facility] Cardiac Rehab   Date  02/23/15   Educator  C. Saxon Crosby,RN  Instruction Review Code  2- meets goals/outcomes      Falls  Prevention: - Provides verbal and written material to individual with discussion of falls prevention and safety.      Cardiac Rehab from 03/01/2015 in [facility] Cardiac Rehab   Date  02/23/15   Educator  C. Brienne Liguori,RN   Instruction Review Code  2- meets goals/outcomes      Diabetes: - Individual verbal and written instruction to review signs/symptoms of diabetes, desired ranges of glucose level fasting, after meals and with exercise. Advice that pre and post exercise glucose checks will be done for 3 sessions at entry of program.    Knowledge Questionnaire Score:     Knowledge Questionnaire Score - 02/23/15 1503    Knowledge Questionnaire Score   Pre Score Not done by patient      Personal Goals and Risk Factors at Admission:     Personal Goals and Risk Factors at Admission - 02/24/15 1410    Personal Goals and Risk Factors on Admission    Weight Management No   Intervention Learn and follow the exercise and diet guidelines while in the program. Utilize the nutrition and education classes to help gain knowledge of the diet and exercise expectations in the program   Admit Weight 178 lb (80.74 kg)  Jacob Reyes reported Dr. Sabra Heck stopped his fluid pills since his weight was down 12 lbs and sodium was too low.    Goal Weight 190 lb (86.183 kg)      Personal Goals and Risk Factors Review:      Goals and Risk Factor Review      03/03/15 1837 03/11/15 1727 03/18/15 1705 03/29/15 1635 03/29/15 1758   Increase Aerobic Exercise and Physical Activity   Goals Progress/Improvement seen  Yes Yes Yes  Yes   Comments  Evertte feels he isn't getting strength like he would like to but Eero said he did not like taking the Paxil that the doctor suggested. Khamani reports that he has another MD appt with Dr. Emily Filbert next week.  Jacob Reyes says his wife wants him to get more strength but he did alot on REL today and got tired. Is still dizzy at times so kept him off treadmill.  Stressed due to passing out  at home on 10/24 and in [place] seen later follow up by Dr. Sabra Heck.  Did 30 minutes of exercise but Jacob Reyes mentioned he was worried about passing out so I took him to his car in a wheelchair for his wife to drive him home. During exercising sitting on the Nustep his blood pressure dropped to 90/60 but came up to 120/70 with 240 cc of water he drank for me.      04/01/15 1637 04/01/15 1648 04/14/15 1510       Increase Aerobic Exercise and Physical Activity   Goals Progress/Improvement seen  Yes Yes No     Comments Jacob Reyes reports that Dr. Emily Filbert is having him eat more sodium since his sodium levels are low.  Although Jacob Reyes did not stay today to do weights, he did 30+ minutes exercising sitting on the Nustep.Jacob Reyes called back and said Dr.Mark Sabra Heck suggested Physical therapy for him so he won't be in Cardiac Rehab for a while.         Personal Goals Discharge (Final Personal Goals and Risk Factors Review):      Goals and Risk Factor Review - 04/14/15 1510    Increase Aerobic Exercise and Physical Activity  Goals Progress/Improvement seen  No   Comments Jacob Reyes called back and said Dr.Mark Sabra Heck suggested Physical therapy for him so he won't be in Cardiac Rehab for a while.       ITP Comments:   Comments: Jacob Reyes called back and said Dr.Mark Sabra Heck suggested Physical therapy for him so he won't be in Cardiac Rehab for a while.

## 2015-04-19 ENCOUNTER — Other Ambulatory Visit: Payer: Self-pay | Admitting: Internal Medicine

## 2015-04-19 ENCOUNTER — Ambulatory Visit
Admission: RE | Admit: 2015-04-19 | Discharge: 2015-04-19 | Disposition: A | Discharge status: Home / Self Care | Payer: Medicare Other | Source: Ambulatory Visit | Attending: Internal Medicine | Admitting: Internal Medicine

## 2015-04-19 DIAGNOSIS — G9340 Encephalopathy, unspecified: Secondary | ICD-10-CM

## 2015-04-19 DIAGNOSIS — I6782 Cerebral ischemia: Secondary | ICD-10-CM | POA: Insufficient documentation

## 2015-04-19 DIAGNOSIS — G319 Degenerative disease of nervous system, unspecified: Secondary | ICD-10-CM | POA: Insufficient documentation

## 2015-04-19 DIAGNOSIS — F0781 Postconcussional syndrome: Secondary | ICD-10-CM | POA: Insufficient documentation

## 2015-04-19 DIAGNOSIS — G936 Cerebral edema: Secondary | ICD-10-CM | POA: Insufficient documentation

## 2015-04-23 NOTE — Progress Notes (Signed)
Cardiac Individual Treatment Plan  Patient Details  Name: Jacob Reyes MRN: [id number] Date of Birth: [birth year] Referring Provider:  No ref. provider found  Initial Encounter Date:    Visit Diagnosis: S/P CABG x 6  Patient's Home Medications on Admission:  Current outpatient prescriptions:  .  aspirin EC 81 MG tablet, Take 81 mg by mouth daily., Disp: , Rfl:  .  brimonidine-timolol (COMBIGAN) 0.2-0.5 % ophthalmic solution, Place 1 drop into both eyes 2 (two) times daily., Disp: , Rfl:  .  cholecalciferol (VITAMIN D) 1000 UNITS tablet, Take 1,000 Units by mouth daily., Disp: , Rfl:  .  finasteride (PROSCAR) 5 MG tablet, Take 5 mg by mouth daily after supper. , Disp: , Rfl:  .  levothyroxine (SYNTHROID, LEVOTHROID) 75 MCG tablet, Take 75 mcg by mouth daily before breakfast. , Disp: , Rfl:  .  lovastatin (MEVACOR) 40 MG tablet, Take 40 mg by mouth daily after supper. , Disp: , Rfl:  .  LUMIGAN 0.01 % SOLN, Place 1 drop into both eyes at bedtime. , Disp: , Rfl:  .  magnesium chloride (SLOW-MAG) 64 MG TBEC SR tablet, Take 2 tablets (128 mg total) by mouth daily., Disp: 60 tablet, Rfl:  .  Melatonin 5 MG TABS, Take 5 mg by mouth at bedtime as needed (sleep)., Disp: , Rfl:  .  omeprazole (PRILOSEC) 20 MG capsule, Take 20 mg by mouth daily before breakfast. , Disp: , Rfl:  .  polyethylene glycol powder (GLYCOLAX/MIRALAX) powder, Take 17 g by mouth daily. , Disp: , Rfl:  .  tamsulosin (FLOMAX) 0.4 MG CAPS capsule, Take 0.4 mg by mouth 2 (two) times daily. , Disp: , Rfl:   Past Medical History: Past Medical History  Diagnosis Date  . Glaucoma   . Hearing loss   . Reflux   . GERD (gastroesophageal reflux disease)   . Cancer [facility])     prostate  . Colon cancer ([place])   . Coronary artery disease   . Hypertension   . Abnormal stress test   . Depression   . Anxiety     just started med. for anxiety   . History of hiatal hernia     Tobacco Use: History  Smoking status  . Never  Smoker   Smokeless tobacco  . Never Used    Labs: Recent Review Flowsheet Data    Labs for ITP Cardiac and Pulmonary Rehab Latest Ref Rng 12/28/2014 12/28/2014 12/29/2014 12/29/2014 12/29/2014   PHART 7.350 - 7.450 - 7.452(H) 7.411 7.418 -   PCO2ART 35.0 - 45.0 mmHg - 35.2 32.5(L) 31.2(L) -   HCO3 20.0 - 24.0 mEq/L - 24.5(H) 20.7 20.2 -   TCO2 0 - 100 mmol/L 21 26 22 21 20    ACIDBASEDEF 0.0 - 2.0 mmol/L - - 4.0(H) 4.0(H) -   O2SAT - - 99.0 96.0 93.0 -       Exercise Target Goals:    Exercise Program Goal: Individual exercise prescription set with THRR, safety & activity barriers. Participant demonstrates ability to understand and report RPE using BORG scale, to self-measure pulse accurately, and to acknowledge the importance of the exercise prescription.  Exercise Prescription Goal: Starting with aerobic activity 30 plus minutes a day, 3 days per week for initial exercise prescription. Provide home exercise prescription and guidelines that participant acknowledges understanding prior to discharge.  Activity Barriers & Risk Stratification:     Activity Barriers & Risk Stratification - 02/23/15 1105    Activity Barriers & Risk Stratification  Activity Barriers None   Risk Stratification High      6 Minute Walk:     6 Minute Walk      02/23/15 1438       6 Minute Walk   Phase Initial     Distance 650 feet     Walk Time 6 minutes     Resting HR 84 bpm     Resting BP 110/70 mmHg     Max Ex. HR 110 bpm     Max Ex. BP 132/62 mmHg     RPE 12     Symptoms No        Initial Exercise Prescription:     Initial Exercise Prescription - 02/23/15 1400    Date of Initial Exercise Prescription   Date 02/23/15   Treadmill   MPH 1.2   Grade 0   Minutes 10  Use intervals to complete time goals if necessary   Bike   Level 0.2   Minutes 10   Recumbant Bike   Level 3   RPM 40   Watts 25   Minutes 15   NuStep   Level 2   Watts 30   Minutes 15   Arm Ergometer   Level 1    Watts 8   Minutes 10   Arm/Foot Ergometer   Level 4   Watts 15   Minutes 10   Cybex   Level 1   RPM 50   Minutes 10   Recumbant Elliptical   Level 1   RPM 40   Watts 10   Minutes 10   Elliptical   Level 1   Speed 3   Minutes 1   REL-XR   Level 2   Watts 30   Minutes 15   Prescription Details   Frequency (times per week) 3   Duration Progress to 30 minutes of continuous aerobic without signs/symptoms of physical distress   Intensity   THRR REST +  30   Ratings of Perceived Exertion 11-15   Progression Continue progressive overload as per policy without signs/symptoms or physical distress.   Resistance Training   Training Prescription Yes   Weight 2   Reps 10-15      Exercise Prescription Changes:     Exercise Prescription Changes      03/10/15 1600 03/17/15 1800 03/22/15 1200 04/01/15 1442     Exercise Review   Progression   Yes No  Only 2 visits recorded this month    Response to Exercise   Blood Pressure (Admit)   118/78 mmHg 122/78 mmHg    Blood Pressure (Exercise)   158/80 mmHg 142/80 mmHg    Blood Pressure (Exit)   132/84 mmHg 124/70 mmHg    Heart Rate (Admit)   76 bpm 102 bpm    Heart Rate (Exercise)   98 bpm 119 bpm    Heart Rate (Exit)   81 bpm 97 bpm    Rating of Perceived Exertion (Exercise)   13 13    Symptoms   None Feeling weak and dizzy    Comments   Reviewed individualized exercise prescription and made increases per departmental policy. Exercise increases were discussed with the patient and they were able to perform the new work loads without issue (no signs or symptoms).  Jacob Reyes has only had 2 visits this month and was feeling dizzy during his last two visits so the current goal is to maintain current workloads.     Duration   Progress  to 30 minutes of continuous aerobic without signs/symptoms of physical distress Progress to 30 minutes of continuous aerobic without signs/symptoms of physical distress    Intensity   Rest + 30 Rest + 30     Progression   Continue progressive overload as per policy without signs/symptoms or physical distress. Continue progressive overload as per policy without signs/symptoms or physical distress.    Resistance Training   Training Prescription   Yes Yes    Weight   3 3    Reps   10-15 10-15    Interval Training   Interval Training   No No    NuStep   Level    2  T5 NuStep    Watts    20    Minutes    20    Recumbant Elliptical   Level 4 5 5 5   BioStep    Watts 25 30 30 30     Minutes 30 30 30 30        Discharge Exercise Prescription (Final Exercise Prescription Changes):     Exercise Prescription Changes - 04/01/15 1442    Exercise Review   Progression No  Only 2 visits recorded this month   Response to Exercise   Blood Pressure (Admit) 122/78 mmHg   Blood Pressure (Exercise) 142/80 mmHg   Blood Pressure (Exit) 124/70 mmHg   Heart Rate (Admit) 102 bpm   Heart Rate (Exercise) 119 bpm   Heart Rate (Exit) 97 bpm   Rating of Perceived Exertion (Exercise) 13   Symptoms Feeling weak and dizzy   Comments Jacob Reyes has only had 2 visits this month and was feeling dizzy during his last two visits so the current goal is to maintain current workloads.    Duration Progress to 30 minutes of continuous aerobic without signs/symptoms of physical distress   Intensity Rest + 30   Progression Continue progressive overload as per policy without signs/symptoms or physical distress.   Resistance Training   Training Prescription Yes   Weight 3   Reps 10-15   Interval Training   Interval Training No   NuStep   Level 2  T5 NuStep   Watts 20   Minutes 20   Recumbant Elliptical   Level 5  BioStep   Watts 30   Minutes 30      Nutrition:  Target Goals: Understanding of nutrition guidelines, daily intake of sodium 1500mg , cholesterol 200mg , calories 30% from fat and 7% or less from saturated fats, daily to have 5 or more servings of fruits and vegetables.  Biometrics:     Pre Biometrics -  02/23/15 1437    Pre Biometrics   Height 6' 0.2" (1.834 m)   Weight 185 lb 12.8 oz (84.278 kg)   Waist Circumference 40.5 inches   Hip Circumference 44 inches   Waist to Hip Ratio 0.92 %   BMI (Calculated) 25.1       Nutrition Therapy Plan and Nutrition Goals:     Nutrition Therapy & Goals - 02/26/15 1124    Nutrition Therapy   Diet Patient declined MNT at this time.       Nutrition Discharge: Rate Your Plate Scores:     Rate Your Plate - [id number]    Rate Your Plate Scores   Pre Score --  Not done by patient age 7      Nutrition Goals Re-Evaluation:     Nutrition Goals Re-Evaluation      03/11/15 1726 03/29/15 1632 04/01/15 1647 04/14/15 1509  04/23/15 1347   Personal Goal #1 Re-Evaluation   Personal Goal #1 Kennyth Lose cont to eat well.  Is now willing to meet individually with the registered dietician.  "Jacob Reyes" Johntay now reports that Dr. Emily Filbert said his sodium level was low so he is suppose to eat foods high  in sodium. His wife reports that he is also on a strict fluid restriction that incl what he drinks taking his pills, any coffe he drinks etc.      Goal Progress Seen Yes Yes Yes Yes Yes   Comments    Still eats healthy Jacob Reyes said.  Jacob Reyes has been out recently having physical therapy.       Psychosocial: Target Goals: Acknowledge presence or absence of depression, maximize coping skills, provide positive support system. Participant is able to verbalize types and ability to use techniques and skills needed for reducing stress and depression.  Initial Review & Psychosocial Screening:     Initial Psych Review & Screening - 02/23/15 1109    Initial Review   Current issues with Current Sleep Concerns   Family Dynamics   Good Support System? Yes   Comments Discussed maybe sleeping in recliner since his upper chest has some burning where he removed an artery to use. Dr. Jobie Quaker told them some people have that and sit in waiting room with their shirts open to  not touch that site. (patient reports)      Quality of Life Scores:     Quality of Life - 02/23/15 1506    Quality of Life Scores   Health/Function Pre --  Not done by patient. Jacob Reyes said he is 24 and really doesn't have any major problems at his age and his wife agreed.       PHQ-9:     Recent Review Flowsheet Data    Depression screen [facility] 2/9 02/23/2015   Decreased Interest 0   Down, Depressed, Hopeless 0   PHQ - 2 Score 0   Altered sleeping 3   Tired, decreased energy 0   Change in appetite 0   Feeling bad or failure about yourself  0   Trouble concentrating 0   Moving slowly or fidgety/restless 0   Suicidal thoughts 0   PHQ-9 Score 3      Psychosocial Evaluation and Intervention:   Psychosocial Re-Evaluation:     Psychosocial Re-Evaluation      03/11/15 1728 03/29/15 1635 04/01/15 1649 04/14/15 1510 04/23/15 1348   Psychosocial Re-Evaluation   Interventions Encouraged to attend Cardiac Rehabilitation for the exercise Encouraged to attend Cardiac Rehabilitation for the exercise Encouraged to attend Cardiac Rehabilitation for the exercise;Stress management education  Encouraged to attend Cardiac Rehabilitation for the exercise   Comments Telley has a $40 copay each time he attends Cardiac Rehab. He did not say he wanted to leave yet and he mentioned the $40 copay.  Stressed due to passing out at home on 10/24 and in [place] seen later follow up by Dr. Sabra Heck.  Discussed with his wife self care for both of them since "we seem to have a doctors appt weekly and for months we have seen Dr. Emily Filbert weekly". Mrs. Hunsaker drives for her 52 year old husband now. Mr. Horath said he has had to miss some Cardiac Rehab since he has had so many doctors appts. Jacob Reyes and his wife said it has been stressful to go to MD alot to figure out what is wrong with him.  Jacob Reyes has been out recently  having physical therapy.    Continued Psychosocial Services Needed Yes  Yes         Vocational Rehabilitation: Provide vocational rehab assistance to qualifying candidates.   Vocational Rehab Evaluation & Intervention:     Vocational Rehab - 02/23/15 1107    Initial Vocational Rehab Evaluation & Intervention   Assessment shows need for Vocational Rehabilitation No      Education: Education Goals: Education classes will be provided on a weekly basis, covering required topics. Participant will state understanding/return demonstration of topics presented.  Learning Barriers/Preferences:     Learning Barriers/Preferences - 02/23/15 1503    Learning Barriers/Preferences   Learning Barriers Hearing      Education Topics: General Nutrition Guidelines/Fats and Fiber: -Group instruction provided by verbal, written material, models and posters to present the general guidelines for heart healthy nutrition. Gives an explanation and review of dietary fats and fiber.          Cardiac Rehab from 03/01/2015 in [facility] Cardiac Rehab   Date  03/01/15   Educator  PI   Instruction Review Code  2- meets goals/outcomes      Controlling Sodium/Reading Food Labels: -Group verbal and written material supporting the discussion of sodium use in heart healthy nutrition. Review and explanation with models, verbal and written materials for utilization of the food label.   Exercise Physiology & Risk Factors: - Group verbal and written instruction with models to review the exercise physiology of the cardiovascular system and associated critical values. Details cardiovascular disease risk factors and the goals associated with each risk factor.   Aerobic Exercise & Resistance Training: - Gives group verbal and written discussion on the health impact of inactivity. On the components of aerobic and resistive training programs and the benefits of this training and how to safely progress through these programs.   Flexibility, Balance, General Exercise Guidelines: - Provides group  verbal and written instruction on the benefits of flexibility and balance training programs. Provides general exercise guidelines with specific guidelines to those with heart or lung disease. Demonstration and skill practice provided.   Stress Management: - Provides group verbal and written instruction about the health risks of elevated stress, cause of high stress, and healthy ways to reduce stress.   Depression: - Provides group verbal and written instruction on the correlation between heart/lung disease and depressed mood, treatment options, and the stigmas associated with seeking treatment.   Anatomy & Physiology of the Heart: - Group verbal and written instruction and models provide basic cardiac anatomy and physiology, with the coronary electrical and arterial systems. Review of: AMI, Angina, Valve disease, Heart Failure, Cardiac Arrhythmia, Pacemakers, and the ICD.   Cardiac Procedures: - Group verbal and written instruction and models to describe the testing methods done to diagnose heart disease. Reviews the outcomes of the test results. Describes the treatment choices: Medical Management, Angioplasty, or Coronary Bypass Surgery.   Cardiac Medications: - Group verbal and written instruction to review commonly prescribed medications for heart disease. Reviews the medication, class of the drug, and side effects. Includes the steps to properly store meds and maintain the prescription regimen.   Go Sex-Intimacy & Heart Disease, Get SMART - Goal Setting: - Group verbal and written instruction through game format to discuss heart disease and the return to sexual intimacy. Provides group verbal and written material to discuss and apply goal setting through the application of the S.M.A.R.T. Method.   Other Matters of the Heart: - Provides group verbal, written materials and  models to describe Heart Failure, Angina, Valve Disease, and Diabetes in the realm of heart disease. Includes  description of the disease process and treatment options available to the cardiac patient.   Exercise & Equipment Safety: - Individual verbal instruction and demonstration of equipment use and safety with use of the equipment.      Cardiac Rehab from 03/01/2015 in [facility] Cardiac Rehab   Date  02/23/15   Educator  C. Merranda Bolls,RN   Instruction Review Code  1- partially meets, needs review/practice      Infection Prevention: - Provides verbal and written material to individual with discussion of infection control including proper hand washing and proper equipment cleaning during exercise session.      Cardiac Rehab from 03/01/2015 in [facility] Cardiac Rehab   Date  02/23/15   Educator  C. Zyaira Vejar,RN   Instruction Review Code  2- meets goals/outcomes      Falls Prevention: - Provides verbal and written material to individual with discussion of falls prevention and safety.      Cardiac Rehab from 03/01/2015 in [facility] Cardiac Rehab   Date  02/23/15   Educator  C. Lydiana Milley,RN   Instruction Review Code  2- meets goals/outcomes      Diabetes: - Individual verbal and written instruction to review signs/symptoms of diabetes, desired ranges of glucose level fasting, after meals and with exercise. Advice that pre and post exercise glucose checks will be done for 3 sessions at entry of program.    Knowledge Questionnaire Score:     Knowledge Questionnaire Score - 02/23/15 1503    Knowledge Questionnaire Score   Pre Score Not done by patient      Personal Goals and Risk Factors at Admission:     Personal Goals and Risk Factors at Admission - 02/24/15 1410    Personal Goals and Risk Factors on Admission    Weight Management No   Intervention Learn and follow the exercise and diet guidelines while in the program. Utilize the nutrition and education classes to help gain knowledge of the diet and exercise expectations in the program   Admit Weight 178 lb (80.74 kg)  Jacob Reyes reported Dr. Sabra Heck  stopped his fluid pills since his weight was down 12 lbs and sodium was too low.    Goal Weight 190 lb (86.183 kg)      Personal Goals and Risk Factors Review:      Goals and Risk Factor Review      03/03/15 1837 03/11/15 1727 03/18/15 1705 03/29/15 1635 03/29/15 1758   Increase Aerobic Exercise and Physical Activity   Goals Progress/Improvement seen  Yes Yes Yes  Yes   Comments  Jacob Reyes feels he isn't getting strength like he would like to but Gorden said he did not like taking the Paxil that the doctor suggested. Juliocesar reports that he has another MD appt with Dr. Emily Filbert next week.  Jacob Reyes says his wife wants him to get more strength but he did alot on REL today and got tired. Is still dizzy at times so kept him off treadmill.  Stressed due to passing out at home on 10/24 and in [place] seen later follow up by Dr. Sabra Heck.  Did 30 minutes of exercise but Jacob Reyes mentioned he was worried about passing out so I took him to his car in a wheelchair for his wife to drive him home. During exercising sitting on the Nustep his blood pressure dropped to 90/60 but came up to 120/70 with 240 cc  of water he drank for me.      04/01/15 1637 04/01/15 1648 04/14/15 1510 04/23/15 1347     Increase Aerobic Exercise and Physical Activity   Goals Progress/Improvement seen  Yes Yes No Yes    Comments Jacob Reyes reports that Dr. Emily Filbert is having him eat more sodium since his sodium levels are low.  Although Jacob Reyes did not stay today to do weights, he did 30+ minutes exercising sitting on the Nustep.Jacob Reyes called back and said Dr.Mark Sabra Heck suggested Physical therapy for him so he won't be in Cardiac Rehab for a while.  Jacob Reyes has been out recently having physical therapy.        Personal Goals Discharge (Final Personal Goals and Risk Factors Review):      Goals and Risk Factor Review - 04/23/15 1347    Increase Aerobic Exercise and Physical Activity   Goals Progress/Improvement seen  Yes   Comments Jacob Reyes has  been out recently having physical therapy.       ITP Comments:   Comments: Jacob Reyes has been out recently having physical therapy. Hope to return to Cardiac Rehab.

## 2015-04-28 ENCOUNTER — Other Ambulatory Visit: Payer: Self-pay | Admitting: Cardiothoracic Surgery

## 2015-04-28 DIAGNOSIS — Z951 Presence of aortocoronary bypass graft: Secondary | ICD-10-CM

## 2015-04-28 NOTE — Addendum Note (Signed)
Addended by: Lynford Humphrey on: 04/28/2015 11:06 AM   Modules accepted: Orders

## 2015-04-29 ENCOUNTER — Ambulatory Visit (INDEPENDENT_AMBULATORY_CARE_PROVIDER_SITE_OTHER): Payer: Medicare Other | Admitting: Cardiothoracic Surgery

## 2015-04-29 ENCOUNTER — Encounter: Payer: Self-pay | Admitting: Cardiothoracic Surgery

## 2015-04-29 ENCOUNTER — Ambulatory Visit
Admission: RE | Admit: 2015-04-29 | Discharge: 2015-04-29 | Disposition: A | Discharge status: Home / Self Care | Payer: Medicare Other | Source: Ambulatory Visit | Attending: Cardiothoracic Surgery | Admitting: Cardiothoracic Surgery

## 2015-04-29 VITALS — BP 136/90 | HR 79 | Resp 20 | Ht 73.0 in | Wt 192.0 lb

## 2015-04-29 DIAGNOSIS — I2510 Atherosclerotic heart disease of native coronary artery without angina pectoris: Secondary | ICD-10-CM | POA: Diagnosis not present

## 2015-04-29 DIAGNOSIS — J948 Other specified pleural conditions: Secondary | ICD-10-CM | POA: Diagnosis not present

## 2015-04-29 DIAGNOSIS — J90 Pleural effusion, not elsewhere classified: Secondary | ICD-10-CM

## 2015-04-29 DIAGNOSIS — Z951 Presence of aortocoronary bypass graft: Secondary | ICD-10-CM

## 2015-04-29 DIAGNOSIS — Z9889 Other specified postprocedural states: Secondary | ICD-10-CM | POA: Diagnosis not present

## 2015-04-29 NOTE — Progress Notes (Signed)
[address]       ,[place] [address]             [phone]      Jacob Reyes [place] Medical Record [id number] Date of Birth: 1926-02-10  Referring: Teodoro Spray, MD Primary Care: Rusty Aus., MD  Chief Complaint:   POST OP FOLLOW UP 12/28/2014 OPERATIVE REPORT PREOPERATIVE DIAGNOSIS: Severe 3-vessel coronary artery disease. POSTOPERATIVE DIAGNOSIS: Severe 3-vessel coronary artery disease. SURGICAL PROCEDURE: Coronary artery bypass grafting x6, with left internal mammary to the left anterior descending coronary artery in 2 sites, in the mid vessel and the distal LAD, reverse saphenous vein graft to the diagonal, sequential reverse saphenous vein graft to the first and second obtuse marginal, and reverse saphenous vein graft to the mid posterior descending coronary artery, with right leg greater saphenous vein harvesting from the thigh and calf. SURGEON: Lanelle Bal, M.D.  History of Present Illness:     Patient making reasonable progression postop.patient continues to feel weak at times , he is moved from cardiac rehabilitation to home physical therapy . He did fall in his house several weeks ago without any obvious injury a CT scan of the head was unremarkable . Patient unaware of any recurrent AFIB. He had significant fluid retention especially in his lower extremities postoperatively,  this has improved.   He has been talking to primary care about anxiety, and has tried several different medications.      Past Medical History  Diagnosis Date  . Glaucoma   . Hearing loss   . Reflux   . GERD (gastroesophageal reflux disease)   . Cancer [facility])     prostate  . Colon cancer ([place])   . Coronary artery disease   . Hypertension   . Abnormal stress test   . Depression   . Anxiety     just started med. for anxiety   . History of hiatal hernia      History  Smoking status  . Never Smoker   Smokeless tobacco  . Never Used      History  Alcohol Use No     No Known Allergies  Current Outpatient Prescriptions  Medication Sig Dispense Refill  . aspirin EC 81 MG tablet Take 81 mg by mouth daily.    . brimonidine-timolol (COMBIGAN) 0.2-0.5 % ophthalmic solution Place 1 drop into both eyes 2 (two) times daily.    . cholecalciferol (VITAMIN D) 1000 UNITS tablet Take 1,000 Units by mouth daily.    . finasteride (PROSCAR) 5 MG tablet Take 5 mg by mouth daily after supper.     . levothyroxine (SYNTHROID, LEVOTHROID) 75 MCG tablet Take 75 mcg by mouth daily before breakfast.     . lovastatin (MEVACOR) 40 MG tablet Take 40 mg by mouth daily after supper.     Marland Kitchen LUMIGAN 0.01 % SOLN Place 1 drop into both eyes at bedtime.     . magnesium chloride (SLOW-MAG) 64 MG TBEC SR tablet Take 2 tablets (128 mg total) by mouth daily. 60 tablet   . omeprazole (PRILOSEC) 20 MG capsule Take 20 mg by mouth daily before breakfast.     . polyethylene glycol powder (GLYCOLAX/MIRALAX) powder Take 17 g by mouth daily.     . tamsulosin (FLOMAX) 0.4 MG CAPS capsule Take 0.4 mg by mouth 2 (two) times daily.      No current facility-administered medications for this visit.       Physical Exam:  BP 136/90 mmHg  Pulse 79  Resp 20  Ht 6\' 1"  (1.854 m)  Wt 192 lb (87.091 kg)  BMI 25.34 kg/m2  SpO2 97%  General appearance: alert, cooperative, appears stated age and no distress Neurologic: intact Heart: regular rate and rhythm, S1, S2 normal, no murmur, click, rub or gallop Lungs: diminished breath sounds LLL Abdomen: soft, non-tender; bowel sounds normal; no masses,  no organomegaly Extremities: extremities normal, atraumatic, no cyanosis or edema Wound: sternum satble healing well, has very mild pedal edema much improved from previous exams   Diagnostic Studies & Laboratory data:     Recent Radiology Findings:   Dg Chest 2 View  04/29/2015  CLINICAL DATA:  CABG 12/28/2014 EXAM: CHEST  2 VIEW COMPARISON:  Radiograph 03/25/2015  FINDINGS: Sternotomy wires overlie normal cardiac silhouette there is LEFT lower lobe atelectasis and effusion slightly improved compared to exam of 03/25/2015. The RIGHT lung base is clear. Upper lobes are clear. Degenerative osteophytosis of the thoracic spine. IMPRESSION: Persistent and mildly improved LEFT basilar atelectasis and effusion. Electronically Signed   By: Suzy Bouchard M.D.   On: 04/29/2015 12:59      Recent Lab Findings: Lab Results  Component Value Date   WBC 8.1 03/14/2015   HGB 15.2 03/14/2015   HCT 45.1 03/14/2015   PLT 184 03/14/2015   GLUCOSE 115* 03/14/2015   ALT 10* 12/31/2014   AST 19 12/31/2014   NA 129* 03/14/2015   K 4.4 03/14/2015   CL 94* 03/14/2015   CREATININE 0.90 03/14/2015   BUN 14 03/14/2015   CO2 28 03/14/2015   TSH 3.385 12/30/2014   INR 1.85* 12/28/2014   HGBA1C 5.7* 12/25/2014      Assessment / Plan:   Very slowly progressing following cabg    Left pleural effusion is improving on chest x-ray today Return 8  Weeks with chest x-ray to evaluate his effusion    [name] MD      [address] [place] [address] Office [phone]   Beeper [phone]  04/29/2015 1:07 PM

## 2015-05-04 ENCOUNTER — Telehealth: Payer: Self-pay | Admitting: *Deleted

## 2015-05-04 NOTE — Telephone Encounter (Signed)
Pt's wife, Letta Median states Dr. Cannon Kettle was unable to trim pt's callous because he was on a blood thinner, and the area is painful and draining.  I suggested an earlier appt, to help decrease the chances of pt developing and infection.  Transferred to schedulers.

## 2015-05-06 ENCOUNTER — Ambulatory Visit (INDEPENDENT_AMBULATORY_CARE_PROVIDER_SITE_OTHER): Payer: Medicare Other | Admitting: Podiatry

## 2015-05-06 ENCOUNTER — Encounter: Payer: Self-pay | Admitting: Podiatry

## 2015-05-06 VITALS — BP 153/85 | HR 86 | Resp 18

## 2015-05-06 DIAGNOSIS — B351 Tinea unguium: Secondary | ICD-10-CM

## 2015-05-06 DIAGNOSIS — L97511 Non-pressure chronic ulcer of other part of right foot limited to breakdown of skin: Secondary | ICD-10-CM

## 2015-05-06 DIAGNOSIS — M79673 Pain in unspecified foot: Secondary | ICD-10-CM | POA: Diagnosis not present

## 2015-05-06 DIAGNOSIS — Q828 Other specified congenital malformations of skin: Secondary | ICD-10-CM

## 2015-05-09 ENCOUNTER — Encounter: Payer: Self-pay | Admitting: Podiatry

## 2015-05-09 NOTE — Progress Notes (Signed)
Patient ID: Jacob Reyes, male   DOB: 1926/05/14, 81 y.o.   MRN: [id number]  Subjective: 81 y.o.-year-old male returns the office today for a painful callus to the bottom of his right foot. He has had some bloody drainage coming from the callus. He states the areas become very painful to walk on. Denies any swelling redness or red streaks or any redness around the area. No pus or malodor. He is also present for his nails be trimmed as they are elongated and painful. No other complaints at this time. Denies any systemic complaints such as fevers, chills, nausea, vomiting.   Objective: AAO 3, NAD DP/PT pulses palpable, CRT less than 3 seconds Chronic appearing edema to bilateral lower extremities.  Protective sensation intact with Derrel Nip monofilament Hyperkeratotic lesion right foot submetatarsal 2. Upon debridement there is a superficial wound present which does not probe to bone and there is no undermining or tunneling. There is a very prominent second metatarsal head plantarly with atrophy of the fat pad. There is no swelling erythema, ascending cellulitis or fluctuance, crepitus, malodor, drainage/purulence. No other open lesions or pre-ulcerative lesions. Nails hypertrophic, dystrophic, elongated, brittle, discolored 9. There is tenderness overlying these nails 1-5 bilaterally with the exception of the right 2nd toe as it has previously been amputated. There is no surrounding erythema or drainage along the nail sites. No other areas of tenderness bilateral lower extremities. Significant HAV deformity bilaterally with hammertoe contracture. No overlying, erythema, increased warmth. No pain with calf compression, warmth, erythema.  Assessment: Patient presents with symptomatic onychomycosis x9, porokeratosi/ulceration  R sub 2.  Plan: -Treatment options including alternatives, risks, complications were discussed -Nails sharply debrided 9 without  complication/bleeding. -Hyperkerotic lesion sharply debrided to reveal superficial wound without signs of infection. Area was cleaned. Antibiotic ointment and a bandage daily. Monitor for any clinical signs or symptoms of infection and directed to call the office immediately should any occur or go to the ER. -Discussed daily foot inspection. If there are any changes, to call the office immediately.  -Follow-up in 2-3 weeks for wound check or sooner if any problems are to arise. In the meantime, encouraged to call the office with any questions, concerns, changes symptoms.

## 2015-05-10 ENCOUNTER — Ambulatory Visit: Payer: Medicare Other | Admitting: Physical Therapy

## 2015-05-13 ENCOUNTER — Encounter: Payer: Medicare Other | Admitting: Physical Therapy

## 2015-05-18 ENCOUNTER — Encounter: Payer: Self-pay | Admitting: *Deleted

## 2015-05-18 ENCOUNTER — Ambulatory Visit: Payer: Medicare Other | Admitting: Sports Medicine

## 2015-05-19 ENCOUNTER — Encounter: Payer: Medicare Other | Admitting: Physical Therapy

## 2015-05-20 NOTE — Progress Notes (Signed)
Pulmonary Individual Treatment Plan  Patient Details  Name: Jacob Reyes MRN: [id number] Date of Birth: 1926/01/28 Referring Provider:  No ref. provider found  Initial Encounter Date:    Visit Diagnosis: No diagnosis found.  Patient's Home Medications on Admission:  Current outpatient prescriptions:  .  aspirin EC 81 MG tablet, Take 81 mg by mouth daily., Disp: , Rfl:  .  brimonidine-timolol (COMBIGAN) 0.2-0.5 % ophthalmic solution, Place 1 drop into both eyes 2 (two) times daily., Disp: , Rfl:  .  cholecalciferol (VITAMIN D) 1000 UNITS tablet, Take 1,000 Units by mouth daily., Disp: , Rfl:  .  finasteride (PROSCAR) 5 MG tablet, Take 5 mg by mouth daily after supper. , Disp: , Rfl:  .  levothyroxine (SYNTHROID, LEVOTHROID) 75 MCG tablet, Take 75 mcg by mouth daily before breakfast. , Disp: , Rfl:  .  LORazepam (ATIVAN) 0.5 MG tablet, Take 0.5 mg by mouth 2 (two) times daily., Disp: , Rfl:  .  lovastatin (MEVACOR) 40 MG tablet, Take 40 mg by mouth daily after supper. , Disp: , Rfl:  .  LUMIGAN 0.01 % SOLN, Place 1 drop into both eyes at bedtime. , Disp: , Rfl:  .  magnesium chloride (SLOW-MAG) 64 MG TBEC SR tablet, Take 2 tablets (128 mg total) by mouth daily., Disp: 60 tablet, Rfl:  .  omeprazole (PRILOSEC) 20 MG capsule, Take 20 mg by mouth daily before breakfast. , Disp: , Rfl:  .  polyethylene glycol powder (GLYCOLAX/MIRALAX) powder, Take 17 g by mouth daily. , Disp: , Rfl:  .  tamsulosin (FLOMAX) 0.4 MG CAPS capsule, Take 0.4 mg by mouth 2 (two) times daily. , Disp: , Rfl:   Past Medical History: Past Medical History  Diagnosis Date  . Glaucoma   . Hearing loss   . Reflux   . GERD (gastroesophageal reflux disease)   . Cancer [facility])     prostate  . Colon cancer ([place])   . Coronary artery disease   . Hypertension   . Abnormal stress test   . Depression   . Anxiety     just started med. for anxiety   . History of hiatal hernia     Tobacco Use: History  Smoking status   . Never Smoker   Smokeless tobacco  . Never Used    Labs: Recent Review Flowsheet Data    Labs for ITP Cardiac and Pulmonary Rehab Latest Ref Rng 12/28/2014 12/28/2014 12/29/2014 12/29/2014 12/29/2014   PHART 7.350 - 7.450 - 7.452(H) 7.411 7.418 -   PCO2ART 35.0 - 45.0 mmHg - 35.2 32.5(L) 31.2(L) -   HCO3 20.0 - 24.0 mEq/L - 24.5(H) 20.7 20.2 -   TCO2 0 - 100 mmol/L 21 26 22 21 20    ACIDBASEDEF 0.0 - 2.0 mmol/L - - 4.0(H) 4.0(H) -   O2SAT - - 99.0 96.0 93.0 -       ADL UCSD:    Pulmonary Function Assessment:   Exercise Target Goals:    Exercise Program Goal: Individual exercise prescription set with THRR, safety & activity barriers. Participant demonstrates ability to understand and report RPE using BORG scale, to self-measure pulse accurately, and to acknowledge the importance of the exercise prescription.  Exercise Prescription Goal: Starting with aerobic activity 30 plus minutes a day, 3 days per week for initial exercise prescription. Provide home exercise prescription and guidelines that participant acknowledges understanding prior to discharge.  Activity Barriers & Risk Stratification:     Activity Barriers & Risk Stratification - 02/23/15  1105    Activity Barriers & Risk Stratification   Activity Barriers None   Risk Stratification High      6 Minute Walk:     6 Minute Walk      02/23/15 1438       6 Minute Walk   Phase Initial     Distance 650 feet     Walk Time 6 minutes     Resting HR 84 bpm     Resting BP 110/70 mmHg     Max Ex. HR 110 bpm     Max Ex. BP 132/62 mmHg     RPE 12     Symptoms No        Initial Exercise Prescription:     Initial Exercise Prescription - 02/23/15 1400    Date of Initial Exercise Prescription   Date 02/23/15   Treadmill   MPH 1.2   Grade 0   Minutes 10  Use intervals to complete time goals if necessary   Bike   Level 0.2   Minutes 10   Recumbant Bike   Level 3   RPM 40   Watts 25   Minutes 15   NuStep    Level 2   Watts 30   Minutes 15   Arm Ergometer   Level 1   Watts 8   Minutes 10   Arm/Foot Ergometer   Level 4   Watts 15   Minutes 10   Cybex   Level 1   RPM 50   Minutes 10   Recumbant Elliptical   Level 1   RPM 40   Watts 10   Minutes 10   Elliptical   Level 1   Speed 3   Minutes 1   REL-XR   Level 2   Watts 30   Minutes 15   Prescription Details   Frequency (times per week) 3   Duration Progress to 30 minutes of continuous aerobic without signs/symptoms of physical distress   Intensity   THRR REST +  30   Ratings of Perceived Exertion 11-15   Progression Continue progressive overload as per policy without signs/symptoms or physical distress.   Resistance Training   Training Prescription Yes   Weight 2   Reps 10-15      Exercise Prescription Changes:     Exercise Prescription Changes      03/10/15 1600 03/17/15 1800 03/22/15 1200 04/01/15 1442 05/18/15 0800   Exercise Review   Progression   Yes No  Only 2 visits recorded this month No  Absent since 04/01/15   Response to Exercise   Blood Pressure (Admit)   118/78 mmHg 122/78 mmHg    Blood Pressure (Exercise)   158/80 mmHg 142/80 mmHg    Blood Pressure (Exit)   132/84 mmHg 124/70 mmHg    Heart Rate (Admit)   76 bpm 102 bpm    Heart Rate (Exercise)   98 bpm 119 bpm    Heart Rate (Exit)   81 bpm 97 bpm    Rating of Perceived Exertion (Exercise)   13 13    Symptoms   None Feeling weak and dizzy    Comments   Reviewed individualized exercise prescription and made increases per departmental policy. Exercise increases were discussed with the patient and they were able to perform the new work loads without issue (no signs or symptoms).  Jacob Reyes has only had 2 visits this month and was feeling dizzy during his last two visits so the  current goal is to maintain current workloads.     Duration   Progress to 30 minutes of continuous aerobic without signs/symptoms of physical distress Progress to 30 minutes of  continuous aerobic without signs/symptoms of physical distress Progress to 30 minutes of continuous aerobic without signs/symptoms of physical distress   Intensity   Rest + 30 Rest + 30 Rest + 30   Progression   Continue progressive overload as per policy without signs/symptoms or physical distress. Continue progressive overload as per policy without signs/symptoms or physical distress. Continue progressive overload as per policy without signs/symptoms or physical distress.   Resistance Training   Training Prescription   Yes Yes Yes   Weight   3 3 3    Reps   10-15 10-15 10-15   Interval Training   Interval Training   No No No   NuStep   Level    2  T5 NuStep 2  T5 NuStep   Watts    20 20   Minutes    20 20   Recumbant Elliptical   Level 4 5 5 5   BioStep 5  BioStep   Watts 25 30 30 30 30    Minutes 30 30 30 30 30       Discharge Exercise Prescription (Final Exercise Prescription Changes):     Exercise Prescription Changes - 05/18/15 0800    Exercise Review   Progression No  Absent since 04/01/15   Response to Exercise   Duration Progress to 30 minutes of continuous aerobic without signs/symptoms of physical distress   Intensity Rest + 30   Progression Continue progressive overload as per policy without signs/symptoms or physical distress.   Resistance Training   Training Prescription Yes   Weight 3   Reps 10-15   Interval Training   Interval Training No   NuStep   Level 2  T5 NuStep   Watts 20   Minutes 20   Recumbant Elliptical   Level 5  BioStep   Watts 30   Minutes 30       Nutrition:  Target Goals: Understanding of nutrition guidelines, daily intake of sodium 1500mg , cholesterol 200mg , calories 30% from fat and 7% or less from saturated fats, daily to have 5 or more servings of fruits and vegetables.  Biometrics:     Pre Biometrics - 02/23/15 1437    Pre Biometrics   Height 6' 0.2" (1.834 m)   Weight 185 lb 12.8 oz (84.278 kg)   Waist Circumference  40.5 inches   Hip Circumference 44 inches   Waist to Hip Ratio 0.92 %   BMI (Calculated) 25.1       Nutrition Therapy Plan and Nutrition Goals:     Nutrition Therapy & Goals - 02/26/15 1124    Nutrition Therapy   Diet Patient declined MNT at this time.       Nutrition Discharge: Rate Your Plate Scores:     Rate Your Plate - [id number]    Rate Your Plate Scores   Pre Score --  Not done by patient age 81      Psychosocial: Target Goals: Acknowledge presence or absence of depression, maximize coping skills, provide positive support system. Participant is able to verbalize types and ability to use techniques and skills needed for reducing stress and depression.  Initial Review & Psychosocial Screening:     Initial Psych Review & Screening - 02/23/15 1109    Initial Review   Current issues with Current Sleep Concerns   Family  Dynamics   Good Support System? Yes   Comments Discussed maybe sleeping in recliner since his upper chest has some burning where he removed an artery to use. Dr. Jobie Quaker told them some people have that and sit in waiting room with their shirts open to not touch that site. (patient reports)      Quality of Life Scores:     Quality of Life - 02/23/15 1506    Quality of Life Scores   Health/Function Pre --  Not done by patient. Jacob Reyes said he is 52 and really doesn't have any major problems at his age and his wife agreed.       PHQ-9:     Recent Review Flowsheet Data    Depression screen [facility] 2/9 02/23/2015   Decreased Interest 0   Down, Depressed, Hopeless 0   PHQ - 2 Score 0   Altered sleeping 3   Tired, decreased energy 0   Change in appetite 0   Feeling bad or failure about yourself  0   Trouble concentrating 0   Moving slowly or fidgety/restless 0   Suicidal thoughts 0   PHQ-9 Score 3      Psychosocial Evaluation and Intervention:   Psychosocial Re-Evaluation:     Psychosocial Re-Evaluation      03/11/15 1728 03/29/15  1635 04/01/15 1649 04/14/15 1510 04/23/15 1348   Psychosocial Re-Evaluation   Interventions Encouraged to attend Cardiac Rehabilitation for the exercise Encouraged to attend Cardiac Rehabilitation for the exercise Encouraged to attend Cardiac Rehabilitation for the exercise;Stress management education  Encouraged to attend Cardiac Rehabilitation for the exercise   Comments [place] has a $40 copay each time he attends Cardiac Rehab. He did not say he wanted to leave yet and he mentioned the $40 copay.  Stressed due to passing out at home on 10/24 and in [place] seen later follow up by Dr. Sabra Heck.  Discussed with his wife self care for both of them since "we seem to have a doctors appt weekly and for months we have seen Dr. Emily Filbert weekly". Mrs. Jepperson drives for her 25 year old husband now. Mr. Tatge said he has had to miss some Cardiac Rehab since he has had so many doctors appts. Jacob Reyes and his wife said it has been stressful to go to MD alot to figure out what is wrong with him.  Jacob Reyes has been out recently having physical therapy.    Continued Psychosocial Services Needed Yes  Yes       05/20/15 1629           Psychosocial Re-Evaluation   Comments Sanjaya has been out a while.          Education: Education Goals: Education classes will be provided on a weekly basis, covering required topics. Participant will state understanding/return demonstration of topics presented.  Learning Barriers/Preferences:     Learning Barriers/Preferences - 02/23/15 1503    Learning Barriers/Preferences   Learning Barriers Hearing      Education Topics: Initial Evaluation Education: - Verbal, written and demonstration of respiratory meds, RPE/PD scales, oximetry and breathing techniques. Instruction on use of nebulizers and MDIs: cleaning and proper use, rinsing mouth with steroid doses and importance of monitoring MDI activations.   General Nutrition Guidelines/Fats and Fiber: -Group instruction  provided by verbal, written material, models and posters to present the general guidelines for heart healthy nutrition. Gives an explanation and review of dietary fats and fiber.  Cardiac Rehab from 03/01/2015 in [facility] Cardiac and Pulmonary Rehab   Date  03/01/15   Educator  PI   Instruction Review Code  2- meets goals/outcomes      Controlling Sodium/Reading Food Labels: -Group verbal and written material supporting the discussion of sodium use in heart healthy nutrition. Review and explanation with models, verbal and written materials for utilization of the food label.   Exercise Physiology & Risk Factors: - Group verbal and written instruction with models to review the exercise physiology of the cardiovascular system and associated critical values. Details cardiovascular disease risk factors and the goals associated with each risk factor.   Aerobic Exercise & Resistance Training: - Gives group verbal and written discussion on the health impact of inactivity. On the components of aerobic and resistive training programs and the benefits of this training and how to safely progress through these programs.   Flexibility, Balance, General Exercise Guidelines: - Provides group verbal and written instruction on the benefits of flexibility and balance training programs. Provides general exercise guidelines with specific guidelines to those with heart or lung disease. Demonstration and skill practice provided.   Stress Management: - Provides group verbal and written instruction about the health risks of elevated stress, cause of high stress, and healthy ways to reduce stress.   Depression: - Provides group verbal and written instruction on the correlation between heart/lung disease and depressed mood, treatment options, and the stigmas associated with seeking treatment.   Exercise & Equipment Safety: - Individual verbal instruction and demonstration of equipment use and safety with  use of the equipment.      Cardiac Rehab from 03/01/2015 in [facility] Cardiac [facility] Pulmonary Rehab   Date  02/23/15   Educator  C. Merle Whitehorn,RN   Instruction Review Code  1- partially meets, needs review/practice      Infection Prevention: - Provides verbal and written material to individual with discussion of infection control including proper hand washing and proper equipment cleaning during exercise session.      Cardiac Rehab from 03/01/2015 in [facility] Cardiac and Pulmonary Rehab   Date  02/23/15   Educator  C. Ethyl Vila,RN   Instruction Review Code  2- meets goals/outcomes      Falls Prevention: - Provides verbal and written material to individual with discussion of falls prevention and safety.      Cardiac Rehab from 03/01/2015 in [facility] Cardiac and Pulmonary Rehab   Date  02/23/15   Educator  C. Di Jasmer,RN   Instruction Review Code  2- meets goals/outcomes      Diabetes: - Individual verbal and written instruction to review signs/symptoms of diabetes, desired ranges of glucose level fasting, after meals and with exercise. Advice that pre and post exercise glucose checks will be done for 3 sessions at entry of program.   Chronic Lung Diseases: - Group verbal and written instruction to review new updates, new respiratory medications, new advancements in procedures and treatments. Provide informative websites and "800" numbers of self-education.   Lung Procedures: - Group verbal and written instruction to describe testing methods done to diagnose lung disease. Review the outcome of test results. Describe the treatment choices: Pulmonary Function Tests, ABGs and oximetry.   Energy Conservation: - Provide group verbal and written instruction for methods to conserve energy, plan and organize activities. Instruct on pacing techniques, use of adaptive equipment and posture/positioning to relieve shortness of breath.   Triggers: - Group verbal and written instruction to review types of  environmental controls: home humidity, furnaces,  filters, dust mite/pet prevention, HEPA vacuums. To discuss weather changes, air quality and the benefits of nasal washing.   Exacerbations: - Group verbal and written instruction to provide: warning signs, infection symptoms, calling MD promptly, preventive modes, and value of vaccinations. Review: effective airway clearance, coughing and/or vibration techniques. Create an Sports administrator.   Oxygen: - Individual and group verbal and written instruction on oxygen therapy. Includes supplement oxygen, available portable oxygen systems, continuous and intermittent flow rates, oxygen safety, concentrators, and Medicare reimbursement for oxygen.   Respiratory Medications: - Group verbal and written instruction to review medications for lung disease. Drug class, frequency, complications, importance of spacers, rinsing mouth after steroid MDI's, and proper cleaning methods for nebulizers.   AED/CPR: - Group verbal and written instruction with the use of models to demonstrate the basic use of the AED with the basic ABC's of resuscitation.   Breathing Retraining: - Provides individuals verbal and written instruction on purpose, frequency, and proper technique of diaphragmatic breathing and pursed-lipped breathing. Applies individual practice skills.   Anatomy and Physiology of the Lungs: - Group verbal and written instruction with the use of models to provide basic lung anatomy and physiology related to function, structure and complications of lung disease.   Heart Failure: - Group verbal and written instruction on the basics of heart failure: signs/symptoms, treatments, explanation of ejection fraction, enlarged heart and cardiomyopathy.   Sleep Apnea: - Individual verbal and written instruction to review Obstructive Sleep Apnea. Review of risk factors, methods for diagnosing and types of masks and machines for OSA.   Anxiety: - Provides group,  verbal and written instruction on the correlation between heart/lung disease and anxiety, treatment options, and management of anxiety.   Relaxation: - Provides group, verbal and written instruction about the benefits of relaxation for patients with heart/lung disease. Also provides patients with examples of relaxation techniques.   Knowledge Questionnaire Score:     Knowledge Questionnaire Score - 02/23/15 1503    Knowledge Questionnaire Score   Pre Score Not done by patient      Personal Goals and Risk Factors at Admission:     Personal Goals and Risk Factors at Admission - 02/24/15 1410    Personal Goals and Risk Factors on Admission    Weight Management No   Intervention Learn and follow the exercise and diet guidelines while in the program. Utilize the nutrition and education classes to help gain knowledge of the diet and exercise expectations in the program   Admit Weight 178 lb (80.74 kg)  Jacob Reyes reported Dr. Sabra Heck stopped his fluid pills since his weight was down 12 lbs and sodium was too low.    Goal Weight 190 lb (86.183 kg)      Personal Goals and Risk Factors Review:      Goals and Risk Factor Review      03/03/15 1837 03/11/15 1727 03/18/15 1705 03/29/15 1635 03/29/15 1758   Increase Aerobic Exercise and Physical Activity   Goals Progress/Improvement seen  Yes Yes Yes  Yes   Comments  Dredan feels he isn't getting strength like he would like to but Jayvone said he did not like taking the Paxil that the doctor suggested. Jeiden reports that he has another MD appt with Dr. Emily Filbert next week.  Jacob Reyes says his wife wants him to get more strength but he did alot on REL today and got tired. Is still dizzy at times so kept him off treadmill.  Stressed due to passing out at  home on 10/24 and in [place] seen later follow up by Dr. Sabra Heck.  Did 30 minutes of exercise but Jacob Reyes mentioned he was worried about passing out so I took him to his car in a wheelchair for his wife to  drive him home. During exercising sitting on the Nustep his blood pressure dropped to 90/60 but came up to 120/70 with 240 cc of water he drank for me.      04/01/15 1637 04/01/15 1648 04/14/15 1510 04/23/15 1347 05/20/15 1629   Increase Aerobic Exercise and Physical Activity   Goals Progress/Improvement seen  Yes Yes No Yes No   Comments Jacob Reyes reports that Dr. Emily Filbert is having him eat more sodium since his sodium levels are low.  Although Jacob Reyes did not stay today to do weights, he did 30+ minutes exercising sitting on the Nustep.Jacob Reyes called back and said Dr.Mark Sabra Heck suggested Physical therapy for him so he won't be in Cardiac Rehab for a while.  Jacob Reyes has been out recently having physical therapy.  Jacob Reyes has been out for a while from Cardiac Rehab.       Personal Goals Discharge (Final Personal Goals and Risk Factors Review):      Goals and Risk Factor Review - 05/20/15 1629    Increase Aerobic Exercise and Physical Activity   Goals Progress/Improvement seen  No   Comments Jacob Reyes has been out for a while from Cardiac Rehab.       ITP Comments:   Comments: Waddie has been out a while from Cardiac Rehab.

## 2015-05-21 ENCOUNTER — Telehealth: Payer: Self-pay | Admitting: *Deleted

## 2015-05-21 NOTE — Telephone Encounter (Signed)
Called to check on status to return to program. No answer

## 2015-05-23 NOTE — Progress Notes (Signed)
Cardiac Individual Treatment Plan  Patient Details  Name: Jacob Reyes MRN: [id number] Date of Birth: Sep 23, 1925 Referring Provider:  Teodoro Spray, MD  Initial Encounter Date:    Visit Diagnosis: S/P CABG x 6 - Plan: CARDIAC REHAB 30 DAY REVIEW  Patient's Home Medications on Admission:  Current outpatient prescriptions:  .  aspirin EC 81 MG tablet, Take 81 mg by mouth daily., Disp: , Rfl:  .  brimonidine-timolol (COMBIGAN) 0.2-0.5 % ophthalmic solution, Place 1 drop into both eyes 2 (two) times daily., Disp: , Rfl:  .  cholecalciferol (VITAMIN D) 1000 UNITS tablet, Take 1,000 Units by mouth daily., Disp: , Rfl:  .  finasteride (PROSCAR) 5 MG tablet, Take 5 mg by mouth daily after supper. , Disp: , Rfl:  .  levothyroxine (SYNTHROID, LEVOTHROID) 75 MCG tablet, Take 75 mcg by mouth daily before breakfast. , Disp: , Rfl:  .  LORazepam (ATIVAN) 0.5 MG tablet, Take 0.5 mg by mouth 2 (two) times daily., Disp: , Rfl:  .  lovastatin (MEVACOR) 40 MG tablet, Take 40 mg by mouth daily after supper. , Disp: , Rfl:  .  LUMIGAN 0.01 % SOLN, Place 1 drop into both eyes at bedtime. , Disp: , Rfl:  .  magnesium chloride (SLOW-MAG) 64 MG TBEC SR tablet, Take 2 tablets (128 mg total) by mouth daily., Disp: 60 tablet, Rfl:  .  omeprazole (PRILOSEC) 20 MG capsule, Take 20 mg by mouth daily before breakfast. , Disp: , Rfl:  .  polyethylene glycol powder (GLYCOLAX/MIRALAX) powder, Take 17 g by mouth daily. , Disp: , Rfl:  .  tamsulosin (FLOMAX) 0.4 MG CAPS capsule, Take 0.4 mg by mouth 2 (two) times daily. , Disp: , Rfl:   Past Medical History: Past Medical History  Diagnosis Date  . Glaucoma   . Hearing loss   . Reflux   . GERD (gastroesophageal reflux disease)   . Cancer [facility])     prostate  . Colon cancer ([place])   . Coronary artery disease   . Hypertension   . Abnormal stress test   . Depression   . Anxiety     just started med. for anxiety   . History of hiatal hernia     Tobacco  Use: History  Smoking status  . Never Smoker   Smokeless tobacco  . Never Used    Labs: Recent Review Flowsheet Data    Labs for ITP Cardiac and Pulmonary Rehab Latest Ref Rng 12/28/2014 12/28/2014 12/29/2014 12/29/2014 12/29/2014   PHART 7.350 - 7.450 - 7.452(H) 7.411 7.418 -   PCO2ART 35.0 - 45.0 mmHg - 35.2 32.5(L) 31.2(L) -   HCO3 20.0 - 24.0 mEq/L - 24.5(H) 20.7 20.2 -   TCO2 0 - 100 mmol/L 21 26 22 21 20    ACIDBASEDEF 0.0 - 2.0 mmol/L - - 4.0(H) 4.0(H) -   O2SAT - - 99.0 96.0 93.0 -       Exercise Target Goals:    Exercise Program Goal: Individual exercise prescription set with THRR, safety & activity barriers. Participant demonstrates ability to understand and report RPE using BORG scale, to self-measure pulse accurately, and to acknowledge the importance of the exercise prescription.  Exercise Prescription Goal: Starting with aerobic activity 30 plus minutes a day, 3 days per week for initial exercise prescription. Provide home exercise prescription and guidelines that participant acknowledges understanding prior to discharge.  Activity Barriers & Risk Stratification:     Activity Barriers & Risk Stratification - 02/23/15 1105  Activity Barriers & Risk Stratification   Activity Barriers None   Risk Stratification High      6 Minute Walk:     6 Minute Walk      02/23/15 1438       6 Minute Walk   Phase Initial     Distance 650 feet     Walk Time 6 minutes     Resting HR 84 bpm     Resting BP 110/70 mmHg     Max Ex. HR 110 bpm     Max Ex. BP 132/62 mmHg     RPE 12     Symptoms No        Initial Exercise Prescription:     Initial Exercise Prescription - 02/23/15 1400    Date of Initial Exercise Prescription   Date 02/23/15   Treadmill   MPH 1.2   Grade 0   Minutes 10  Use intervals to complete time goals if necessary   Bike   Level 0.2   Minutes 10   Recumbant Bike   Level 3   RPM 40   Watts 25   Minutes 15   NuStep   Level 2   Watts 30    Minutes 15   Arm Ergometer   Level 1   Watts 8   Minutes 10   Arm/Foot Ergometer   Level 4   Watts 15   Minutes 10   Cybex   Level 1   RPM 50   Minutes 10   Recumbant Elliptical   Level 1   RPM 40   Watts 10   Minutes 10   Elliptical   Level 1   Speed 3   Minutes 1   REL-XR   Level 2   Watts 30   Minutes 15   Prescription Details   Frequency (times per week) 3   Duration Progress to 30 minutes of continuous aerobic without signs/symptoms of physical distress   Intensity   THRR REST +  30   Ratings of Perceived Exertion 11-15   Progression Continue progressive overload as per policy without signs/symptoms or physical distress.   Resistance Training   Training Prescription Yes   Weight 2   Reps 10-15      Exercise Prescription Changes:     Exercise Prescription Changes      03/10/15 1600 03/17/15 1800 03/22/15 1200 04/01/15 1442 05/18/15 0800   Exercise Review   Progression   Yes No  Only 2 visits recorded this month No  Absent since 04/01/15   Response to Exercise   Blood Pressure (Admit)   118/78 mmHg 122/78 mmHg    Blood Pressure (Exercise)   158/80 mmHg 142/80 mmHg    Blood Pressure (Exit)   132/84 mmHg 124/70 mmHg    Heart Rate (Admit)   76 bpm 102 bpm    Heart Rate (Exercise)   98 bpm 119 bpm    Heart Rate (Exit)   81 bpm 97 bpm    Rating of Perceived Exertion (Exercise)   13 13    Symptoms   None Feeling weak and dizzy    Comments   Reviewed individualized exercise prescription and made increases per departmental policy. Exercise increases were discussed with the patient and they were able to perform the new work loads without issue (no signs or symptoms).  Jacob Reyes has only had 2 visits this month and was feeling dizzy during his last two visits so the current goal is to  maintain current workloads.     Duration   Progress to 30 minutes of continuous aerobic without signs/symptoms of physical distress Progress to 30 minutes of continuous aerobic without  signs/symptoms of physical distress Progress to 30 minutes of continuous aerobic without signs/symptoms of physical distress   Intensity   Rest + 30 Rest + 30 Rest + 30   Progression   Continue progressive overload as per policy without signs/symptoms or physical distress. Continue progressive overload as per policy without signs/symptoms or physical distress. Continue progressive overload as per policy without signs/symptoms or physical distress.   Resistance Training   Training Prescription   Yes Yes Yes   Weight   3 3 3    Reps   10-15 10-15 10-15   Interval Training   Interval Training   No No No   NuStep   Level    2  T5 NuStep 2  T5 NuStep   Watts    20 20   Minutes    20 20   Recumbant Elliptical   Level 4 5 5 5   BioStep 5  BioStep   Watts 25 30 30 30 30    Minutes 30 30 30 30 30       Discharge Exercise Prescription (Final Exercise Prescription Changes):     Exercise Prescription Changes - 05/18/15 0800    Exercise Review   Progression No  Absent since 04/01/15   Response to Exercise   Duration Progress to 30 minutes of continuous aerobic without signs/symptoms of physical distress   Intensity Rest + 30   Progression Continue progressive overload as per policy without signs/symptoms or physical distress.   Resistance Training   Training Prescription Yes   Weight 3   Reps 10-15   Interval Training   Interval Training No   NuStep   Level 2  T5 NuStep   Watts 20   Minutes 20   Recumbant Elliptical   Level 5  BioStep   Watts 30   Minutes 30      Nutrition:  Target Goals: Understanding of nutrition guidelines, daily intake of sodium 1500mg , cholesterol 200mg , calories 30% from fat and 7% or less from saturated fats, daily to have 5 or more servings of fruits and vegetables.  Biometrics:     Pre Biometrics - 02/23/15 1437    Pre Biometrics   Height 6' 0.2" (1.834 m)   Weight 185 lb 12.8 oz (84.278 kg)   Waist Circumference 40.5 inches   Hip  Circumference 44 inches   Waist to Hip Ratio 0.92 %   BMI (Calculated) 25.1       Nutrition Therapy Plan and Nutrition Goals:     Nutrition Therapy & Goals - 02/26/15 1124    Nutrition Therapy   Diet Patient declined MNT at this time.       Nutrition Discharge: Rate Your Plate Scores:     Rate Your Plate - [id number]    Rate Your Plate Scores   Pre Score --  Not done by patient age 82      Nutrition Goals Re-Evaluation:     Nutrition Goals Re-Evaluation      03/11/15 1726 03/29/15 1632 04/01/15 1647 04/14/15 1509 04/23/15 1347   Personal Goal #1 Re-Evaluation   Personal Goal #1 Jacob Reyes to eat well.  Is now willing to meet individually with the registered dietician.  "Jacob Reyes" Reyes now reports that Jacob Reyes said his sodium level was low so he is suppose to eat foods  high  in sodium. His wife reports that he is also on a strict fluid restriction that incl what he drinks taking his pills, any coffe he drinks etc.      Goal Progress Seen Yes Yes Yes Yes Yes   Comments    Still eats healthy Jacob Reyes said.  Jacob Reyes has been out recently having physical therapy.      05/20/15 1629           Personal Goal #1 Re-Evaluation   Goal Progress Seen No       Comments Jacob Reyes has been out for a long time.          Psychosocial: Target Goals: Acknowledge presence or absence of depression, maximize coping skills, provide positive support system. Participant is able to verbalize types and ability to use techniques and skills needed for reducing stress and depression.  Initial Review & Psychosocial Screening:     Initial Psych Review & Screening - 02/23/15 1109    Initial Review   Current issues with Current Sleep Concerns   Family Dynamics   Good Support System? Yes   Comments Discussed maybe sleeping in recliner since his upper chest has some burning where he removed an artery to use. Jacob Reyes told them some people have that and sit in waiting room with their shirts  open to not touch that site. (patient reports)      Quality of Life Scores:     Quality of Life - 02/23/15 1506    Quality of Life Scores   Health/Function Pre --  Not done by patient. Jacob Reyes said he is 22 and really doesn't have any major problems at his age and his wife agreed.       PHQ-9:     Recent Review Flowsheet Data    Depression screen [facility] 2/9 02/23/2015   Decreased Interest 0   Down, Depressed, Hopeless 0   PHQ - 2 Score 0   Altered sleeping 3   Tired, decreased energy 0   Change in appetite 0   Feeling bad or failure about yourself  0   Trouble concentrating 0   Moving slowly or fidgety/restless 0   Suicidal thoughts 0   PHQ-9 Score 3      Psychosocial Evaluation and Intervention:   Psychosocial Re-Evaluation:     Psychosocial Re-Evaluation      03/11/15 1728 03/29/15 1635 04/01/15 1649 04/14/15 1510 04/23/15 1348   Psychosocial Re-Evaluation   Interventions Encouraged to attend Cardiac Rehabilitation for the exercise Encouraged to attend Cardiac Rehabilitation for the exercise Encouraged to attend Cardiac Rehabilitation for the exercise;Stress management education  Encouraged to attend Cardiac Rehabilitation for the exercise   Comments Jacob Reyes has a $40 copay each time he attends Cardiac Rehab. He did not say he wanted to leave yet and he mentioned the $40 copay.  Stressed due to passing out at home on 10/24 and in [place] seen later follow up by Jacob Reyes.  Discussed with his wife self care for both of them since "we seem to have a doctors appt weekly and for months we have seen Jacob Reyes weekly". 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 Jacob Reyes said he has had to miss some Cardiac Rehab since he has had so many doctors appts. Jacob Reyes and his wife said it has been stressful to go to MD alot to figure out what is wrong with him.  Jacob Reyes has been out recently having physical therapy.  Continued Psychosocial Services Needed Yes  Yes        05/20/15 1629           Psychosocial Re-Evaluation   Comments Jacob Reyes has been out a while.           Vocational Rehabilitation: Provide vocational rehab assistance to qualifying candidates.   Vocational Rehab Evaluation & Intervention:     Vocational Rehab - 02/23/15 1107    Initial Vocational Rehab Evaluation & Intervention   Assessment shows need for Vocational Rehabilitation No      Education: Education Goals: Education classes will be provided on a weekly basis, covering required topics. Participant will state understanding/return demonstration of topics presented.  Learning Barriers/Preferences:     Learning Barriers/Preferences - 02/23/15 1503    Learning Barriers/Preferences   Learning Barriers Hearing      Education Topics: General Nutrition Guidelines/Fats and Fiber: -Group instruction provided by verbal, written material, models and posters to present the general guidelines for heart healthy nutrition. Gives an explanation and review of dietary fats and fiber.          Cardiac Rehab from 03/01/2015 in [facility] Cardiac and Pulmonary Rehab   Date  03/01/15   Educator  PI   Instruction Review Code  2- meets goals/outcomes      Controlling Sodium/Reading Food Labels: -Group verbal and written material supporting the discussion of sodium use in heart healthy nutrition. Review and explanation with models, verbal and written materials for utilization of the food label.   Exercise Physiology & Risk Factors: - Group verbal and written instruction with models to review the exercise physiology of the cardiovascular system and associated critical values. Details cardiovascular disease risk factors and the goals associated with each risk factor.   Aerobic Exercise & Resistance Training: - Gives group verbal and written discussion on the health impact of inactivity. On the components of aerobic and resistive training programs and the benefits of this training and how to  safely progress through these programs.   Flexibility, Balance, General Exercise Guidelines: - Provides group verbal and written instruction on the benefits of flexibility and balance training programs. Provides general exercise guidelines with specific guidelines to those with heart or lung disease. Demonstration and skill practice provided.   Stress Management: - Provides group verbal and written instruction about the health risks of elevated stress, cause of high stress, and healthy ways to reduce stress.   Depression: - Provides group verbal and written instruction on the correlation between heart/lung disease and depressed mood, treatment options, and the stigmas associated with seeking treatment.   Anatomy & Physiology of the Heart: - Group verbal and written instruction and models provide basic cardiac anatomy and physiology, with the coronary electrical and arterial systems. Review of: AMI, Angina, Valve disease, Heart Failure, Cardiac Arrhythmia, Pacemakers, and the ICD.   Cardiac Procedures: - Group verbal and written instruction and models to describe the testing methods done to diagnose heart disease. Reviews the outcomes of the test results. Describes the treatment choices: Medical Management, Angioplasty, or Coronary Bypass Surgery.   Cardiac Medications: - Group verbal and written instruction to review commonly prescribed medications for heart disease. Reviews the medication, class of the drug, and side effects. Includes the steps to properly store meds and maintain the prescription regimen.   Go Sex-Intimacy & Heart Disease, Get SMART - Goal Setting: - Group verbal and written instruction through game format to discuss heart disease and the return to sexual intimacy. Provides group verbal and written  material to discuss and apply goal setting through the application of the S.M.A.R.T. Method.   Other Matters of the Heart: - Provides group verbal, written materials and  models to describe Heart Failure, Angina, Valve Disease, and Diabetes in the realm of heart disease. Includes description of the disease process and treatment options available to the cardiac patient.   Exercise & Equipment Safety: - Individual verbal instruction and demonstration of equipment use and safety with use of the equipment.      Cardiac Rehab from 03/01/2015 in [facility] Cardiac and Pulmonary Rehab   Date  02/23/15   Educator  C. Enterkin,RN   Instruction Review Code  1- partially meets, needs review/practice      Infection Prevention: - Provides verbal and written material to individual with discussion of infection control including proper hand washing and proper equipment cleaning during exercise session.      Cardiac Rehab from 03/01/2015 in [facility] Cardiac and Pulmonary Rehab   Date  02/23/15   Educator  C. Enterkin,RN   Instruction Review Code  2- meets goals/outcomes      Falls Prevention: - Provides verbal and written material to individual with discussion of falls prevention and safety.      Cardiac Rehab from 03/01/2015 in [facility] Cardiac and Pulmonary Rehab   Date  02/23/15   Educator  C. Enterkin,RN   Instruction Review Code  2- meets goals/outcomes      Diabetes: - Individual verbal and written instruction to review signs/symptoms of diabetes, desired ranges of glucose level fasting, after meals and with exercise. Advice that pre and post exercise glucose checks will be done for 3 sessions at entry of program.    Knowledge Questionnaire Score:     Knowledge Questionnaire Score - 02/23/15 1503    Knowledge Questionnaire Score   Pre Score Not done by patient      Personal Goals and Risk Factors at Admission:     Personal Goals and Risk Factors at Admission - 02/24/15 1410    Personal Goals and Risk Factors on Admission    Weight Management No   Intervention Learn and follow the exercise and diet guidelines while in the program. Utilize the nutrition and  education classes to help gain knowledge of the diet and exercise expectations in the program   Admit Weight 178 lb (80.74 kg)  Jacob Reyes reported Jacob Reyes stopped his fluid pills since his weight was down 12 lbs and sodium was too low.    Goal Weight 190 lb (86.183 kg)      Personal Goals and Risk Factors Review:      Goals and Risk Factor Review      03/03/15 1837 03/11/15 1727 03/18/15 1705 03/29/15 1635 03/29/15 1758   Increase Aerobic Exercise and Physical Activity   Goals Progress/Improvement seen  Yes Yes Yes  Yes   Comments  Jacob Reyes feels he isn't getting strength like he would like to but Jacob Reyes said he did not like taking the Paxil that the doctor suggested. Jacob Reyes reports that he has another MD appt with Jacob Reyes next week.  Jacob Reyes says his wife wants him to get more strength but he did alot on REL today and got tired. Is still dizzy at times so kept him off treadmill.  Stressed due to passing out at home on 10/24 and in [place] seen later follow up by Jacob Reyes.  Did 30 minutes of exercise but Jacob Reyes mentioned he was worried about passing out so I took  him to his car in a wheelchair for his wife to drive him home. During exercising sitting on the Nustep his blood pressure dropped to 90/60 but came up to 120/70 with 240 cc of water he drank for me.      04/01/15 1637 04/01/15 1648 04/14/15 1510 04/23/15 1347 05/20/15 1629   Increase Aerobic Exercise and Physical Activity   Goals Progress/Improvement seen  Yes Yes No Yes No   Comments Jacob Reyes reports that Jacob Reyes is having him eat more sodium since his sodium levels are low.  Although Jacob Reyes did not stay today to do weights, he did 30+ minutes exercising sitting on the Nustep.Jacob Reyes called back and said Jacob Reyes suggested Physical therapy for him so he won't be in Cardiac Rehab for a while.  Jacob Reyes has been out recently having physical therapy.  Jacob Reyes has been out for a while from Cardiac Rehab.       Personal Goals  Discharge (Final Personal Goals and Risk Factors Review):      Goals and Risk Factor Review - 05/20/15 1629    Increase Aerobic Exercise and Physical Activity   Goals Progress/Improvement seen  No   Comments Jacob Reyes has been out for a while from Cardiac Rehab.       ITP Comments:     ITP Comments      05/23/15 1346           ITP Comments Ready for 30 day review.  Continue with ITP          Comments:

## 2015-05-25 ENCOUNTER — Encounter: Payer: Medicare Other | Admitting: Physical Therapy

## 2015-05-26 NOTE — Telephone Encounter (Signed)
No answer

## 2015-05-26 NOTE — Addendum Note (Signed)
Addended by: Lynford Humphrey on: 05/26/2015 11:31 AM   Modules accepted: Orders

## 2015-05-27 ENCOUNTER — Ambulatory Visit (INDEPENDENT_AMBULATORY_CARE_PROVIDER_SITE_OTHER): Payer: PPO | Admitting: Podiatry

## 2015-05-27 ENCOUNTER — Encounter: Payer: Self-pay | Admitting: Podiatry

## 2015-05-27 DIAGNOSIS — R5383 Other fatigue: Secondary | ICD-10-CM | POA: Diagnosis not present

## 2015-05-27 DIAGNOSIS — L84 Corns and callosities: Secondary | ICD-10-CM

## 2015-05-27 DIAGNOSIS — I5021 Acute systolic (congestive) heart failure: Secondary | ICD-10-CM | POA: Diagnosis not present

## 2015-05-27 NOTE — Progress Notes (Signed)
Patient ID: Jacob Reyes, male   DOB: Feb 15, 1926, 82 y.o.   MRN: [id number]  Subjective: 82 y.o.-year-old male returns the office today for follow-up evaluation to wound on the bottom his right foot submetatarsal 2. He states that since the area was trimmed last appointment he said no pain. After last appointment he did have some drainage to the area but denies any pus. He says the drainage. Shortly after. No swelling redness or red streaks. No other complaints at this time. Denies any systemic complaints such as fevers, chills, nausea, vomiting.   Objective: AAO 3, NAD DP/PT pulses palpable, CRT less than 3 seconds Chronic appearing edema to bilateral lower extremities.  Protective sensation intact with Derrel Nip monofilament Hyperkeratotic lesion right foot submetatarsal 2. Upon debridement there is no underlying ulceration, drainage or other signs of infection at this time. There is prominent metatarsal heads with atrophy of the fat pad. There has been a previous second toe amputation. No other open lesions or pre-ulcer lesions. No pain with calf compression, warmth, erythema.  Assessment: Patient presents with pre-ulcerative callus right foot with healed ulceration  Plan: -Treatment options including alternatives, risks, complications were discussed -Hyperkeratotic lesion right subdural metatarsal to sharply debrided without Complications or bleeding and the underlying ulceration appears to be healed. Continue to inspect the area daily. Did apply an offloading pad to his orthotic to help take more pressure off the area. -Follow-up 2 months or sooner if any problems arise. In the meantime, encouraged to call the office with any questions, concerns, change in symptoms.   [name], DPM

## 2015-06-01 ENCOUNTER — Ambulatory Visit: Payer: Medicare Other | Admitting: Sports Medicine

## 2015-06-07 ENCOUNTER — Encounter: Payer: Self-pay | Admitting: *Deleted

## 2015-06-09 ENCOUNTER — Encounter: Payer: Self-pay | Admitting: *Deleted

## 2015-06-17 ENCOUNTER — Telehealth: Payer: Self-pay | Admitting: *Deleted

## 2015-06-17 NOTE — Telephone Encounter (Signed)
Called to check on status to return to program. LMOM 

## 2015-06-17 NOTE — Progress Notes (Signed)
Cardiac Individual Treatment Plan  Patient Details  Name: Jacob Reyes MRN: [id number] Date of Birth: Oct 11, 1925 Referring Provider:  Teodoro Spray, MD  Initial Encounter Date:    Visit Diagnosis: S/P CABG x 6 - Plan: CARDIAC REHAB 30 DAY REVIEW, CARDIAC REHAB 47 DAY REVIEW  Patient's Home Medications on Admission:  Current outpatient prescriptions:  .  aspirin EC 81 MG tablet, Take 81 mg by mouth daily., Disp: , Rfl:  .  brimonidine-timolol (COMBIGAN) 0.2-0.5 % ophthalmic solution, Place 1 drop into both eyes 2 (two) times daily., Disp: , Rfl:  .  cholecalciferol (VITAMIN D) 1000 UNITS tablet, Take 1,000 Units by mouth daily., Disp: , Rfl:  .  finasteride (PROSCAR) 5 MG tablet, Take 5 mg by mouth daily after supper. , Disp: , Rfl:  .  levothyroxine (SYNTHROID, LEVOTHROID) 75 MCG tablet, Take 75 mcg by mouth daily before breakfast. , Disp: , Rfl:  .  LORazepam (ATIVAN) 0.5 MG tablet, Take 0.5 mg by mouth 2 (two) times daily., Disp: , Rfl:  .  lovastatin (MEVACOR) 40 MG tablet, Take 40 mg by mouth daily after supper. , Disp: , Rfl:  .  LUMIGAN 0.01 % SOLN, Place 1 drop into both eyes at bedtime. , Disp: , Rfl:  .  magnesium chloride (SLOW-MAG) 64 MG TBEC SR tablet, Take 2 tablets (128 mg total) by mouth daily., Disp: 60 tablet, Rfl:  .  omeprazole (PRILOSEC) 20 MG capsule, Take 20 mg by mouth daily before breakfast. , Disp: , Rfl:  .  polyethylene glycol powder (GLYCOLAX/MIRALAX) powder, Take 17 g by mouth daily. , Disp: , Rfl:  .  tamsulosin (FLOMAX) 0.4 MG CAPS capsule, Take 0.4 mg by mouth 2 (two) times daily. , Disp: , Rfl:   Past Medical History: Past Medical History  Diagnosis Date  . Glaucoma   . Hearing loss   . Reflux   . GERD (gastroesophageal reflux disease)   . Cancer [facility])     prostate  . Colon cancer ([place])   . Coronary artery disease   . Hypertension   . Abnormal stress test   . Depression   . Anxiety     just started med. for anxiety   . History of hiatal  hernia     Tobacco Use: History  Smoking status  . Never Smoker   Smokeless tobacco  . Never Used    Labs: Recent Review Flowsheet Data    Labs for ITP Cardiac and Pulmonary Rehab Latest Ref Rng 12/28/2014 12/28/2014 12/29/2014 12/29/2014 12/29/2014   PHART 7.350 - 7.450 - 7.452(H) 7.411 7.418 -   PCO2ART 35.0 - 45.0 mmHg - 35.2 32.5(L) 31.2(L) -   HCO3 20.0 - 24.0 mEq/L - 24.5(H) 20.7 20.2 -   TCO2 0 - 100 mmol/L 21 26 22 21 20    ACIDBASEDEF 0.0 - 2.0 mmol/L - - 4.0(H) 4.0(H) -   O2SAT - - 99.0 96.0 93.0 -       Exercise Target Goals:    Exercise Program Goal: Individual exercise prescription set with THRR, safety & activity barriers. Participant demonstrates ability to understand and report RPE using BORG scale, to self-measure pulse accurately, and to acknowledge the importance of the exercise prescription.  Exercise Prescription Goal: Starting with aerobic activity 30 plus minutes a day, 3 days per week for initial exercise prescription. Provide home exercise prescription and guidelines that participant acknowledges understanding prior to discharge.  Activity Barriers & Risk Stratification:     Activity Barriers & Risk  Stratification - 02/23/15 1105    Activity Barriers & Risk Stratification   Activity Barriers None   Risk Stratification High      6 Minute Walk:     6 Minute Walk      02/23/15 1438       6 Minute Walk   Phase Initial     Distance 650 feet     Walk Time 6 minutes     Resting HR 84 bpm     Resting BP 110/70 mmHg     Max Ex. HR 110 bpm     Max Ex. BP 132/62 mmHg     RPE 12     Symptoms No        Initial Exercise Prescription:     Initial Exercise Prescription - 02/23/15 1400    Date of Initial Exercise Prescription   Date 02/23/15   Treadmill   MPH 1.2   Grade 0   Minutes 10  Use intervals to complete time goals if necessary   Bike   Level 0.2   Minutes 10   Recumbant Bike   Level 3   RPM 40   Watts 25   Minutes 15   NuStep    Level 2   Watts 30   Minutes 15   Arm Ergometer   Level 1   Watts 8   Minutes 10   Arm/Foot Ergometer   Level 4   Watts 15   Minutes 10   Cybex   Level 1   RPM 50   Minutes 10   Recumbant Elliptical   Level 1   RPM 40   Watts 10   Minutes 10   Elliptical   Level 1   Speed 3   Minutes 1   REL-XR   Level 2   Watts 30   Minutes 15   Prescription Details   Frequency (times per week) 3   Duration Progress to 30 minutes of continuous aerobic without signs/symptoms of physical distress   Intensity   THRR REST +  30   Ratings of Perceived Exertion 11-15   Progression Continue progressive overload as per policy without signs/symptoms or physical distress.   Resistance Training   Training Prescription Yes   Weight 2   Reps 10-15      Exercise Prescription Changes:     Exercise Prescription Changes      03/10/15 1600 03/17/15 1800 03/22/15 1200 04/01/15 1442 05/18/15 0800   Exercise Review   Progression   Yes No  Only 2 visits recorded this month No  Absent since 04/01/15   Response to Exercise   Blood Pressure (Admit)   118/78 mmHg 122/78 mmHg    Blood Pressure (Exercise)   158/80 mmHg 142/80 mmHg    Blood Pressure (Exit)   132/84 mmHg 124/70 mmHg    Heart Rate (Admit)   76 bpm 102 bpm    Heart Rate (Exercise)   98 bpm 119 bpm    Heart Rate (Exit)   81 bpm 97 bpm    Rating of Perceived Exertion (Exercise)   13 13    Symptoms   None Feeling weak and dizzy    Comments   Reviewed individualized exercise prescription and made increases per departmental policy. Exercise increases were discussed with the patient and they were able to perform the new work loads without issue (no signs or symptoms).  Jacob Reyes has only had 2 visits this month and was feeling dizzy during his last two  visits so the current goal is to maintain current workloads.     Duration   Progress to 30 minutes of continuous aerobic without signs/symptoms of physical distress Progress to 30 minutes of  continuous aerobic without signs/symptoms of physical distress Progress to 30 minutes of continuous aerobic without signs/symptoms of physical distress   Intensity   Rest + 30 Rest + 30 Rest + 30   Progression   Continue progressive overload as per policy without signs/symptoms or physical distress. Continue progressive overload as per policy without signs/symptoms or physical distress. Continue progressive overload as per policy without signs/symptoms or physical distress.   Resistance Training   Training Prescription   Yes Yes Yes   Weight   3 3 3    Reps   10-15 10-15 10-15   Interval Training   Interval Training   No No No   NuStep   Level    2  T5 NuStep 2  T5 NuStep   Watts    20 20   Minutes    20 20   Recumbant Elliptical   Level 4 5 5 5   BioStep 5  BioStep   Watts 25 30 30 30 30    Minutes 30 30 30 30 30      06/07/15 1000           Exercise Review   Progression No  Absent since 04/01/15       Response to Exercise   Duration Progress to 30 minutes of continuous aerobic without signs/symptoms of physical distress       Intensity Rest + 30       Progression Continue progressive overload as per policy without signs/symptoms or physical distress.       Resistance Training   Training Prescription Yes       Weight 3       Reps 10-15       Interval Training   Interval Training No       NuStep   Level 2  T5 NuStep       Watts 20       Minutes 20       Recumbant Elliptical   Level 5  BioStep       Watts 30       Minutes 30          Discharge Exercise Prescription (Final Exercise Prescription Changes):     Exercise Prescription Changes - 06/07/15 1000    Exercise Review   Progression No  Absent since 04/01/15   Response to Exercise   Duration Progress to 30 minutes of continuous aerobic without signs/symptoms of physical distress   Intensity Rest + 30   Progression Continue progressive overload as per policy without signs/symptoms or physical distress.    Resistance Training   Training Prescription Yes   Weight 3   Reps 10-15   Interval Training   Interval Training No   NuStep   Level 2  T5 NuStep   Watts 20   Minutes 20   Recumbant Elliptical   Level 5  BioStep   Watts 30   Minutes 30      Nutrition:  Target Goals: Understanding of nutrition guidelines, daily intake of sodium 1500mg , cholesterol 200mg , calories 30% from fat and 7% or less from saturated fats, daily to have 5 or more servings of fruits and vegetables.  Biometrics:     Pre Biometrics - 02/23/15 1437    Pre Biometrics   Height 6' 0.2" (1.834 m)  Weight 185 lb 12.8 oz (84.278 kg)   Waist Circumference 40.5 inches   Hip Circumference 44 inches   Waist to Hip Ratio 0.92 %   BMI (Calculated) 25.1       Nutrition Therapy Plan and Nutrition Goals:     Nutrition Therapy & Goals - 02/26/15 1124    Nutrition Therapy   Diet Patient declined MNT at this time.       Nutrition Discharge: Rate Your Plate Scores:     Rate Your Plate - [id number]    Rate Your Plate Scores   Pre Score --  Not done by patient age 37      Nutrition Goals Re-Evaluation:     Nutrition Goals Re-Evaluation      03/11/15 1726 03/29/15 1632 04/01/15 1647 04/14/15 1509 04/23/15 1347   Personal Goal #1 Re-Evaluation   Personal Goal #1 Kennyth Lose cont to eat well.  Is now willing to meet individually with the registered dietician.  "Jacob Reyes" Kao now reports that Dr. Emily Filbert said his sodium level was low so he is suppose to eat foods high  in sodium. His wife reports that he is also on a strict fluid restriction that incl what he drinks taking his pills, any coffe he drinks etc.      Goal Progress Seen Yes Yes Yes Yes Yes   Comments    Still eats healthy Jacob Reyes said.  Jacob Reyes has been out recently having physical therapy.      05/20/15 1629           Personal Goal #1 Re-Evaluation   Goal Progress Seen No       Comments Jacob Reyes has been out for a long time.           Psychosocial: Target Goals: Acknowledge presence or absence of depression, maximize coping skills, provide positive support system. Participant is able to verbalize types and ability to use techniques and skills needed for reducing stress and depression.  Initial Review & Psychosocial Screening:     Initial Psych Review & Screening - 02/23/15 1109    Initial Review   Current issues with Current Sleep Concerns   Family Dynamics   Good Support System? Yes   Comments Discussed maybe sleeping in recliner since his upper chest has some burning where he removed an artery to use. Dr. Jobie Quaker told them some people have that and sit in waiting room with their shirts open to not touch that site. (patient reports)      Quality of Life Scores:     Quality of Life - 02/23/15 1506    Quality of Life Scores   Health/Function Pre --  Not done by patient. Jacob Reyes said he is 31 and really doesn't have any major problems at his age and his wife agreed.       PHQ-9:     Recent Review Flowsheet Data    Depression screen [facility] 2/9 02/23/2015   Decreased Interest 0   Down, Depressed, Hopeless 0   PHQ - 2 Score 0   Altered sleeping 3   Tired, decreased energy 0   Change in appetite 0   Feeling bad or failure about yourself  0   Trouble concentrating 0   Moving slowly or fidgety/restless 0   Suicidal thoughts 0   PHQ-9 Score 3      Psychosocial Evaluation and Intervention:   Psychosocial Re-Evaluation:     Psychosocial Re-Evaluation      03/11/15 1728 03/29/15 1635 04/01/15 1649  04/14/15 1510 04/23/15 1348   Psychosocial Re-Evaluation   Interventions Encouraged to attend Cardiac Rehabilitation for the exercise Encouraged to attend Cardiac Rehabilitation for the exercise Encouraged to attend Cardiac Rehabilitation for the exercise;Stress management education  Encouraged to attend Cardiac Rehabilitation for the exercise   Comments Jacob Reyes has a $40 copay each time he attends Cardiac  Rehab. He did not say he wanted to leave yet and he mentioned the $40 copay.  Stressed due to passing out at home on 10/24 and in [place] seen later follow up by Dr. Sabra Heck.  Discussed with his wife self care for both of them since "we seem to have a doctors appt weekly and for months we have seen Dr. Emily Filbert weekly". Mrs. Fortino drives for her 74 year old husband now. Mr. Webb said he has had to miss some Cardiac Rehab since he has had so many doctors appts. Jacob Reyes and his wife said it has been stressful to go to MD alot to figure out what is wrong with him.  Jacob Reyes has been out recently having physical therapy.    Continued Psychosocial Services Needed Yes  Yes       05/20/15 1629           Psychosocial Re-Evaluation   Comments Cartel has been out a while.           Vocational Rehabilitation: Provide vocational rehab assistance to qualifying candidates.   Vocational Rehab Evaluation & Intervention:     Vocational Rehab - 02/23/15 1107    Initial Vocational Rehab Evaluation & Intervention   Assessment shows need for Vocational Rehabilitation No      Education: Education Goals: Education classes will be provided on a weekly basis, covering required topics. Participant will state understanding/return demonstration of topics presented.  Learning Barriers/Preferences:     Learning Barriers/Preferences - 02/23/15 1503    Learning Barriers/Preferences   Learning Barriers Hearing      Education Topics: General Nutrition Guidelines/Fats and Fiber: -Group instruction provided by verbal, written material, models and posters to present the general guidelines for heart healthy nutrition. Gives an explanation and review of dietary fats and fiber.          Cardiac Rehab from 03/01/2015 in [facility] Cardiac and Pulmonary Rehab   Date  03/01/15   Educator  PI   Instruction Review Code  2- meets goals/outcomes      Controlling Sodium/Reading Food Labels: -Group verbal and written  material supporting the discussion of sodium use in heart healthy nutrition. Review and explanation with models, verbal and written materials for utilization of the food label.   Exercise Physiology & Risk Factors: - Group verbal and written instruction with models to review the exercise physiology of the cardiovascular system and associated critical values. Details cardiovascular disease risk factors and the goals associated with each risk factor.   Aerobic Exercise & Resistance Training: - Gives group verbal and written discussion on the health impact of inactivity. On the components of aerobic and resistive training programs and the benefits of this training and how to safely progress through these programs.   Flexibility, Balance, General Exercise Guidelines: - Provides group verbal and written instruction on the benefits of flexibility and balance training programs. Provides general exercise guidelines with specific guidelines to those with heart or lung disease. Demonstration and skill practice provided.   Stress Management: - Provides group verbal and written instruction about the health risks of elevated stress, cause of high stress, and healthy ways to  reduce stress.   Depression: - Provides group verbal and written instruction on the correlation between heart/lung disease and depressed mood, treatment options, and the stigmas associated with seeking treatment.   Anatomy & Physiology of the Heart: - Group verbal and written instruction and models provide basic cardiac anatomy and physiology, with the coronary electrical and arterial systems. Review of: AMI, Angina, Valve disease, Heart Failure, Cardiac Arrhythmia, Pacemakers, and the ICD.   Cardiac Procedures: - Group verbal and written instruction and models to describe the testing methods done to diagnose heart disease. Reviews the outcomes of the test results. Describes the treatment choices: Medical Management, Angioplasty,  or Coronary Bypass Surgery.   Cardiac Medications: - Group verbal and written instruction to review commonly prescribed medications for heart disease. Reviews the medication, class of the drug, and side effects. Includes the steps to properly store meds and maintain the prescription regimen.   Go Sex-Intimacy & Heart Disease, Get SMART - Goal Setting: - Group verbal and written instruction through game format to discuss heart disease and the return to sexual intimacy. Provides group verbal and written material to discuss and apply goal setting through the application of the S.M.A.R.T. Method.   Other Matters of the Heart: - Provides group verbal, written materials and models to describe Heart Failure, Angina, Valve Disease, and Diabetes in the realm of heart disease. Includes description of the disease process and treatment options available to the cardiac patient.   Exercise & Equipment Safety: - Individual verbal instruction and demonstration of equipment use and safety with use of the equipment.      Cardiac Rehab from 03/01/2015 in [facility] Cardiac and Pulmonary Rehab   Date  02/23/15   Educator  C. Enterkin,RN   Instruction Review Code  1- partially meets, needs review/practice      Infection Prevention: - Provides verbal and written material to individual with discussion of infection control including proper hand washing and proper equipment cleaning during exercise session.      Cardiac Rehab from 03/01/2015 in [facility] Cardiac and Pulmonary Rehab   Date  02/23/15   Educator  C. Enterkin,RN   Instruction Review Code  2- meets goals/outcomes      Falls Prevention: - Provides verbal and written material to individual with discussion of falls prevention and safety.      Cardiac Rehab from 03/01/2015 in [facility] Cardiac and Pulmonary Rehab   Date  02/23/15   Educator  C. Enterkin,RN   Instruction Review Code  2- meets goals/outcomes      Diabetes: - Individual verbal and written  instruction to review signs/symptoms of diabetes, desired ranges of glucose level fasting, after meals and with exercise. Advice that pre and post exercise glucose checks will be done for 3 sessions at entry of program.    Knowledge Questionnaire Score:     Knowledge Questionnaire Score - 02/23/15 1503    Knowledge Questionnaire Score   Pre Score Not done by patient      Personal Goals and Risk Factors at Admission:     Personal Goals and Risk Factors at Admission - 02/24/15 1410    Personal Goals and Risk Factors on Admission    Weight Management No   Intervention Learn and follow the exercise and diet guidelines while in the program. Utilize the nutrition and education classes to help gain knowledge of the diet and exercise expectations in the program   Admit Weight 178 lb (80.74 kg)  Jacob Reyes reported Dr. Sabra Heck stopped his fluid pills  since his weight was down 12 lbs and sodium was too low.    Goal Weight 190 lb (86.183 kg)      Personal Goals and Risk Factors Review:      Goals and Risk Factor Review      03/03/15 1837 03/11/15 1727 03/18/15 1705 03/29/15 1635 03/29/15 1758   Increase Aerobic Exercise and Physical Activity   Goals Progress/Improvement seen  Yes Yes Yes  Yes   Comments  Savio feels he isn't getting strength like he would like to but Advith said he did not like taking the Paxil that the doctor suggested. Guthrie reports that he has another MD appt with Dr. Emily Filbert next week.  Jacob Reyes says his wife wants him to get more strength but he did alot on REL today and got tired. Is still dizzy at times so kept him off treadmill.  Stressed due to passing out at home on 10/24 and in [place] seen later follow up by Dr. Sabra Heck.  Did 30 minutes of exercise but Jacob Reyes mentioned he was worried about passing out so I took him to his car in a wheelchair for his wife to drive him home. During exercising sitting on the Nustep his blood pressure dropped to 90/60 but came up to 120/70  with 240 cc of water he drank for me.      04/01/15 1637 04/01/15 1648 04/14/15 1510 04/23/15 1347 05/20/15 1629   Increase Aerobic Exercise and Physical Activity   Goals Progress/Improvement seen  Yes Yes No Yes No   Comments Jacob Reyes reports that Dr. Emily Filbert is having him eat more sodium since his sodium levels are low.  Although Jacob Reyes did not stay today to do weights, he did 30+ minutes exercising sitting on the Nustep.Jacob Reyes called back and said Dr.Mark Sabra Heck suggested Physical therapy for him so he won't be in Cardiac Rehab for a while.  Jacob Reyes has been out recently having physical therapy.  Jacob Reyes has been out for a while from Cardiac Rehab.      06/07/15 1021           Increase Aerobic Exercise and Physical Activity   Goals Progress/Improvement seen  No       Comments Jacob Reyes's Ex. Rx. will be re-evaluated upon his return due to the extended absence          Personal Goals Discharge (Final Personal Goals and Risk Factors Review):      Goals and Risk Factor Review - 06/07/15 1021    Increase Aerobic Exercise and Physical Activity   Goals Progress/Improvement seen  No   Comments Jacob Reyes's Ex. Rx. will be re-evaluated upon his return due to the extended absence      ITP Comments:     ITP Comments      05/23/15 1346 06/17/15 1332         ITP Comments Ready for 30 day review.  Continue with ITP Ready for 30 day review. Continue with ITP.  Has been absent since 04/01/2015 calls have been made to check on return to program.  No response yet.         Comments:

## 2015-06-23 NOTE — Addendum Note (Signed)
Addended by: Lynford Humphrey on: 06/23/2015 10:39 AM   Modules accepted: Orders

## 2015-06-28 DIAGNOSIS — I5021 Acute systolic (congestive) heart failure: Secondary | ICD-10-CM | POA: Diagnosis not present

## 2015-06-28 DIAGNOSIS — R5382 Chronic fatigue, unspecified: Secondary | ICD-10-CM | POA: Diagnosis not present

## 2015-06-28 DIAGNOSIS — I2510 Atherosclerotic heart disease of native coronary artery without angina pectoris: Secondary | ICD-10-CM | POA: Diagnosis not present

## 2015-06-30 ENCOUNTER — Other Ambulatory Visit: Payer: Self-pay | Admitting: Cardiothoracic Surgery

## 2015-06-30 DIAGNOSIS — Z951 Presence of aortocoronary bypass graft: Secondary | ICD-10-CM

## 2015-07-01 ENCOUNTER — Ambulatory Visit (INDEPENDENT_AMBULATORY_CARE_PROVIDER_SITE_OTHER): Payer: PPO | Admitting: Cardiothoracic Surgery

## 2015-07-01 ENCOUNTER — Encounter: Payer: Self-pay | Admitting: Cardiothoracic Surgery

## 2015-07-01 ENCOUNTER — Ambulatory Visit
Admission: RE | Admit: 2015-07-01 | Discharge: 2015-07-01 | Disposition: A | Discharge status: Home / Self Care | Payer: PPO | Source: Ambulatory Visit | Attending: Cardiothoracic Surgery | Admitting: Cardiothoracic Surgery

## 2015-07-01 VITALS — BP 129/87 | HR 79 | Resp 16 | Ht 73.0 in | Wt 191.0 lb

## 2015-07-01 DIAGNOSIS — J948 Other specified pleural conditions: Secondary | ICD-10-CM | POA: Diagnosis not present

## 2015-07-01 DIAGNOSIS — Z951 Presence of aortocoronary bypass graft: Secondary | ICD-10-CM

## 2015-07-01 DIAGNOSIS — I2510 Atherosclerotic heart disease of native coronary artery without angina pectoris: Secondary | ICD-10-CM | POA: Diagnosis not present

## 2015-07-01 DIAGNOSIS — J90 Pleural effusion, not elsewhere classified: Secondary | ICD-10-CM | POA: Diagnosis not present

## 2015-07-01 NOTE — Progress Notes (Signed)
[address]       Jacob Reyes,Jacob Reyes             [phone]      [facility] Medical Record [id number] Date of Birth: April 28, 1926  Referring: Teodoro Spray, MD Primary Care: Rusty Aus., MD  Chief Complaint:   POST OP FOLLOW UP 12/28/2014 OPERATIVE REPORT PREOPERATIVE DIAGNOSIS: Severe 3-vessel coronary artery disease. POSTOPERATIVE DIAGNOSIS: Severe 3-vessel coronary artery disease. SURGICAL PROCEDURE: Coronary artery bypass grafting x6, with left internal mammary to the left anterior descending coronary artery in 2 sites, in the mid vessel and the distal LAD, reverse saphenous vein graft to the diagonal, sequential reverse saphenous vein graft to the first and second obtuse marginal, and reverse saphenous vein graft to the mid posterior descending coronary artery, with right leg greater saphenous vein harvesting from the thigh and calf. SURGEON: Lanelle Bal, M.D.  History of Present Illness:     Patient making reasonable progression postop.patient continues to feel weak at times , he is moved from cardiac rehabilitation to home physical therapy . He did fall in his house several weeks ago without any obvious injury a CT scan of the head was unremarkable . Patient unaware of any recurrent AFIB. He had significant fluid retention especially in his lower extremities postoperatively,  this has improved.   He has been talking to primary care about anxiety, and has tried several different medications.      Past Medical History  Diagnosis Date  . Glaucoma   . Hearing loss   . Reflux   . GERD (gastroesophageal reflux disease)   . Cancer [facility])     prostate  . Colon cancer ([place])   . Coronary artery disease   . Hypertension   . Abnormal stress test   . Depression   . Anxiety     just started med. for anxiety   . History of hiatal hernia      History  Smoking status  . Never Smoker   Smokeless tobacco  . Never Used      History  Alcohol Use No     No Known Allergies  Current Outpatient Prescriptions  Medication Sig Dispense Refill  . aspirin EC 81 MG tablet Take 81 mg by mouth daily.    . brimonidine-timolol (COMBIGAN) 0.2-0.5 % ophthalmic solution Place 1 drop into both eyes 2 (two) times daily.    . cholecalciferol (VITAMIN D) 1000 UNITS tablet Take 1,000 Units by mouth daily.    . finasteride (PROSCAR) 5 MG tablet Take 5 mg by mouth daily after supper.     . levothyroxine (SYNTHROID, LEVOTHROID) 75 MCG tablet Take 75 mcg by mouth daily before breakfast.     . LORazepam (ATIVAN) 0.5 MG tablet Take 0.5 mg by mouth 2 (two) times daily.    Marland Kitchen lovastatin (MEVACOR) 40 MG tablet Take 40 mg by mouth daily after supper.     Marland Kitchen LUMIGAN 0.01 % SOLN Place 1 drop into both eyes at bedtime.     . magnesium chloride (SLOW-MAG) 64 MG TBEC SR tablet Take 2 tablets (128 mg total) by mouth daily. 60 tablet   . omeprazole (PRILOSEC) 20 MG capsule Take 20 mg by mouth daily before breakfast.     . polyethylene glycol powder (GLYCOLAX/MIRALAX) powder Take 17 g by mouth daily.     . tamsulosin (FLOMAX) 0.4 MG CAPS capsule Take 0.4 mg by mouth 2 (two) times daily.  No current facility-administered medications for this visit.       Physical Exam: BP 129/87 mmHg  Pulse 79  Resp 16  Ht 6\' 1"  (1.854 m)  Wt 191 lb (86.637 kg)  BMI 25.20 kg/m2  SpO2 96%  General appearance: alert, cooperative, appears stated age and no distress Neurologic: intact Heart: regular rate and rhythm, S1, S2 normal, no murmur, click, rub or gallop Lungs: diminished breath sounds LLL Abdomen: soft, non-tender; bowel sounds normal; no masses,  no organomegaly Extremities: extremities normal, atraumatic, no cyanosis or edema Wound: sternum satble healing well, has very mild pedal edema much improved from previous exams   Diagnostic Studies & Laboratory data:     Recent Radiology Findings:   Dg Chest 2 View  07/01/2015  CLINICAL  DATA:  CABG. EXAM: CHEST  2 VIEW COMPARISON:  04/29/2015 .  03/08/2015. FINDINGS: Mediastinum and hilar structures normal. Prior CABG. Heart size stable. Left lower lobe atelectasis and small left pleural effusion again noted. No pneumothorax. Thoracic spine scoliosis. Degenerative change thoracic spine. IMPRESSION: 1. Persistent left lower lobe atelectasis and small left pleural effusion. 2. Prior CABG.  Heart size normal.  No pulmonary venous congestion . Electronically Signed   By: Marcello Moores  Register   On: 07/01/2015 12:36      Recent Lab Findings: Lab Results  Component Value Date   WBC 8.1 03/14/2015   HGB 15.2 03/14/2015   HCT 45.1 03/14/2015   PLT 184 03/14/2015   GLUCOSE 115* 03/14/2015   ALT 10* 12/31/2014   AST 19 12/31/2014   NA 129* 03/14/2015   K 4.4 03/14/2015   CL 94* 03/14/2015   CREATININE 0.90 03/14/2015   BUN 14 03/14/2015   CO2 28 03/14/2015   TSH 3.385 12/30/2014   INR 1.85* 12/28/2014   HGBA1C 5.7* 12/25/2014      Assessment / Plan:   Very slowly progressing following cabg   Small Left pleural effusion stable on chest xray today Return prn   [name] MD      [address] [place] [address] Office [phone]   Beeper [phone]  07/01/2015 1:41 PM

## 2015-07-06 ENCOUNTER — Encounter: Payer: Self-pay | Admitting: *Deleted

## 2015-07-06 DIAGNOSIS — Z951 Presence of aortocoronary bypass graft: Secondary | ICD-10-CM

## 2015-07-13 NOTE — Progress Notes (Signed)
Cardiac Individual Treatment Plan  Patient Details  Name: Jacob Reyes MRN: [id number] Date of Birth: [birth year] Referring Provider:  Teodoro Spray, MD  Initial Encounter Date:    Visit Diagnosis: S/P CABG x 6 - Plan: CARDIAC REHAB 30 DAY REVIEW, CARDIAC REHAB 86 DAY REVIEW, CARDIAC REHAB 46 DAY REVIEW  Patient's Home Medications on Admission:  Current outpatient prescriptions:  .  aspirin EC 81 MG tablet, Take 81 mg by mouth daily., Disp: , Rfl:  .  brimonidine-timolol (COMBIGAN) 0.2-0.5 % ophthalmic solution, Place 1 drop into both eyes 2 (two) times daily., Disp: , Rfl:  .  cholecalciferol (VITAMIN D) 1000 UNITS tablet, Take 1,000 Units by mouth daily., Disp: , Rfl:  .  finasteride (PROSCAR) 5 MG tablet, Take 5 mg by mouth daily after supper. , Disp: , Rfl:  .  levothyroxine (SYNTHROID, LEVOTHROID) 75 MCG tablet, Take 75 mcg by mouth daily before breakfast. , Disp: , Rfl:  .  LORazepam (ATIVAN) 0.5 MG tablet, Take 0.5 mg by mouth 2 (two) times daily., Disp: , Rfl:  .  lovastatin (MEVACOR) 40 MG tablet, Take 40 mg by mouth daily after supper. , Disp: , Rfl:  .  LUMIGAN 0.01 % SOLN, Place 1 drop into both eyes at bedtime. , Disp: , Rfl:  .  magnesium chloride (SLOW-MAG) 64 MG TBEC SR tablet, Take 2 tablets (128 mg total) by mouth daily., Disp: 60 tablet, Rfl:  .  omeprazole (PRILOSEC) 20 MG capsule, Take 20 mg by mouth daily before breakfast. , Disp: , Rfl:  .  polyethylene glycol powder (GLYCOLAX/MIRALAX) powder, Take 17 g by mouth daily. , Disp: , Rfl:  .  tamsulosin (FLOMAX) 0.4 MG CAPS capsule, Take 0.4 mg by mouth 2 (two) times daily. , Disp: , Rfl:   Past Medical History: Past Medical History  Diagnosis Date  . Glaucoma   . Hearing loss   . Reflux   . GERD (gastroesophageal reflux disease)   . Cancer [facility])     prostate  . Colon cancer ([place])   . Coronary artery disease   . Hypertension   . Abnormal stress test   . Depression   . Anxiety     just started med. for  anxiety   . History of hiatal hernia     Tobacco Use: History  Smoking status  . Never Smoker   Smokeless tobacco  . Never Used    Labs: Recent Review Flowsheet Data    Labs for ITP Cardiac and Pulmonary Rehab Latest Ref Rng 12/28/2014 12/28/2014 12/29/2014 12/29/2014 12/29/2014   PHART 7.350 - 7.450 - 7.452(H) 7.411 7.418 -   PCO2ART 35.0 - 45.0 mmHg - 35.2 32.5(L) 31.2(L) -   HCO3 20.0 - 24.0 mEq/L - 24.5(H) 20.7 20.2 -   TCO2 0 - 100 mmol/L 21 26 22 21 20    ACIDBASEDEF 0.0 - 2.0 mmol/L - - 4.0(H) 4.0(H) -   O2SAT - - 99.0 96.0 93.0 -       Exercise Target Goals:    Exercise Program Goal: Individual exercise prescription set with THRR, safety & activity barriers. Participant demonstrates ability to understand and report RPE using BORG scale, to self-measure pulse accurately, and to acknowledge the importance of the exercise prescription.  Exercise Prescription Goal: Starting with aerobic activity 30 plus minutes a day, 3 days per week for initial exercise prescription. Provide home exercise prescription and guidelines that participant acknowledges understanding prior to discharge.  Activity Barriers & Risk Stratification:  Activity Barriers & Cardiac Risk Stratification - 02/23/15 1105    Activity Barriers & Cardiac Risk Stratification   Activity Barriers None   Cardiac Risk Stratification High      6 Minute Walk:     6 Minute Walk      02/23/15 1438       6 Minute Walk   Phase Initial     Distance 650 feet     Walk Time 6 minutes     RPE 12     Symptoms No     Resting HR 84 bpm     Resting BP 110/70 mmHg     Max Ex. HR 110 bpm     Max Ex. BP 132/62 mmHg        Initial Exercise Prescription:     Initial Exercise Prescription - 02/23/15 1400    Date of Initial Exercise Prescription   Date 02/23/15   Treadmill   MPH 1.2   Grade 0   Minutes 10  Use intervals to complete time goals if necessary   Bike   Level 0.2   Minutes 10   Recumbant Bike    Level 3   RPM 40   Watts 25   Minutes 15   NuStep   Level 2   Watts 30   Minutes 15   Arm Ergometer   Level 1   Watts 8   Minutes 10   Arm/Foot Ergometer   Level 4   Watts 15   Minutes 10   Cybex   Level 1   RPM 50   Minutes 10   Recumbant Elliptical   Level 1   RPM 40   Watts 10   Minutes 10   Elliptical   Level 1   Speed 3   Minutes 1   REL-XR   Level 2   Watts 30   Minutes 15   Prescription Details   Frequency (times per week) 3   Duration Progress to 30 minutes of continuous aerobic without signs/symptoms of physical distress   Intensity   THRR REST +  30   Ratings of Perceived Exertion 11-15   Progression Continue progressive overload as per policy without signs/symptoms or physical distress.   Resistance Training   Training Prescription Yes   Weight 2   Reps 10-15      Exercise Prescription Changes:     Exercise Prescription Changes      03/10/15 1600 03/17/15 1800 03/22/15 1200 04/01/15 1442 05/18/15 0800   Exercise Review   Progression   Yes No  Only 2 visits recorded this month No  Absent since 04/01/15   Response to Exercise   Blood Pressure (Admit)   118/78 mmHg 122/78 mmHg    Blood Pressure (Exercise)   158/80 mmHg 142/80 mmHg    Blood Pressure (Exit)   132/84 mmHg 124/70 mmHg    Heart Rate (Admit)   76 bpm 102 bpm    Heart Rate (Exercise)   98 bpm 119 bpm    Heart Rate (Exit)   81 bpm 97 bpm    Rating of Perceived Exertion (Exercise)   13 13    Symptoms   None Feeling weak and dizzy    Comments   Reviewed individualized exercise prescription and made increases per departmental policy. Exercise increases were discussed with the patient and they were able to perform the new work loads without issue (no signs or symptoms).  [name] has only had 2 visits this month and  was feeling dizzy during his last two visits so the current goal is to maintain current workloads.     Duration   Progress to 30 minutes of continuous aerobic without  signs/symptoms of physical distress Progress to 30 minutes of continuous aerobic without signs/symptoms of physical distress Progress to 30 minutes of continuous aerobic without signs/symptoms of physical distress   Intensity   Rest + 30 Rest + 30 Rest + 30   Progression   Continue progressive overload as per policy without signs/symptoms or physical distress. Continue progressive overload as per policy without signs/symptoms or physical distress. Continue progressive overload as per policy without signs/symptoms or physical distress.   Resistance Training   Training Prescription   Yes Yes Yes   Weight   3 3 3    Reps   10-15 10-15 10-15   Interval Training   Interval Training   No No No   NuStep   Level    2  T5 NuStep 2  T5 NuStep   Watts    20 20   Minutes    20 20   Recumbant Elliptical   Level 4 5 5 5   BioStep 5  BioStep   Watts 25 30 30 30 30    Minutes 30 30 30 30 30      06/07/15 1000 07/06/15 0600         Exercise Review   Progression No  Absent since 04/01/15 No  Absent since 04/01/15      Response to Exercise   Comments  Ex. Rx. will need to be re-evaluated upon return due to extended absence      Duration Progress to 30 minutes of continuous aerobic without signs/symptoms of physical distress Progress to 30 minutes of continuous aerobic without signs/symptoms of physical distress      Intensity Rest + 30 Rest + 30      Progression Continue progressive overload as per policy without signs/symptoms or physical distress. Continue progressive overload as per policy without signs/symptoms or physical distress.      Resistance Training   Training Prescription Yes Yes      Weight 3 3      Reps 10-15 10-15      Interval Training   Interval Training No No      NuStep   Level 2  T5 NuStep 2  T5 NuStep      Watts 20 20      Minutes 20 20      Recumbant Elliptical   Level 5  BioStep 5  BioStep      Watts 30 30      Minutes 30 30         Discharge Exercise  Prescription (Final Exercise Prescription Changes):     Exercise Prescription Changes - 07/06/15 0600    Exercise Review   Progression No  Absent since 04/01/15   Response to Exercise   Comments Ex. Rx. will need to be re-evaluated upon return due to extended absence   Duration Progress to 30 minutes of continuous aerobic without signs/symptoms of physical distress   Intensity Rest + 30   Progression Continue progressive overload as per policy without signs/symptoms or physical distress.   Resistance Training   Training Prescription Yes   Weight 3   Reps 10-15   Interval Training   Interval Training No   NuStep   Level 2  T5 NuStep   Watts 20   Minutes 20   Recumbant Elliptical   Level 5  BioStep   Watts 30   Minutes 30      Nutrition:  Target Goals: Understanding of nutrition guidelines, daily intake of sodium 1500mg , cholesterol 200mg , calories 30% from fat and 7% or less from saturated fats, daily to have 5 or more servings of fruits and vegetables.  Biometrics:     Pre Biometrics - 02/23/15 1437    Pre Biometrics   Height 6' 0.2" (1.834 m)   Weight 185 lb 12.8 oz (84.278 kg)   Waist Circumference 40.5 inches   Hip Circumference 44 inches   Waist to Hip Ratio 0.92 %   BMI (Calculated) 25.1       Nutrition Therapy Plan and Nutrition Goals:     Nutrition Therapy & Goals - 02/26/15 1124    Nutrition Therapy   Diet Patient declined MNT at this time.       Nutrition Discharge: Rate Your Plate Scores:     Rate Your Plate - [id number]    Rate Your Plate Scores   Pre Score --  Not done by patient age 22      Nutrition Goals Re-Evaluation:     Nutrition Goals Re-Evaluation      03/11/15 1726 03/29/15 1632 04/01/15 1647 04/14/15 1509 04/23/15 1347   Personal Goal #1 Re-Evaluation   Personal Goal #1 Kennyth Lose cont to eat well.  Is now willing to meet individually with the registered dietician.  "[name]" Jaysin now reports that Dr. Emily Filbert said his  sodium level was low so he is suppose to eat foods high  in sodium. His wife reports that he is also on a strict fluid restriction that incl what he drinks taking his pills, any coffe he drinks etc.      Goal Progress Seen Yes Yes Yes Yes Yes   Comments    Still eats healthy [name] said.  [name] has been out recently having physical therapy.      05/20/15 1629           Personal Goal #1 Re-Evaluation   Goal Progress Seen No       Comments [name] has been out for a long time.          Psychosocial: Target Goals: Acknowledge presence or absence of depression, maximize coping skills, provide positive support system. Participant is able to verbalize types and ability to use techniques and skills needed for reducing stress and depression.  Initial Review & Psychosocial Screening:     Initial Psych Review & Screening - 02/23/15 1109    Initial Review   Current issues with Current Sleep Concerns   Family Dynamics   Good Support System? Yes   Comments Discussed maybe sleeping in recliner since his upper chest has some burning where he removed an artery to use. Dr. Jobie Quaker told them some people have that and sit in waiting room with their shirts open to not touch that site. (patient reports)      Quality of Life Scores:     Quality of Life - 02/23/15 1506    Quality of Life Scores   Health/Function Pre --  Not done by patient. [name] said he is 36 and really doesn't have any major problems at his age and his wife agreed.       PHQ-9:     Recent Review Flowsheet Data    Depression screen [facility] 2/9 02/23/2015   Decreased Interest 0   Down, Depressed, Hopeless 0   PHQ - 2 Score 0   Altered  sleeping 3   Tired, decreased energy 0   Change in appetite 0   Feeling bad or failure about yourself  0   Trouble concentrating 0   Moving slowly or fidgety/restless 0   Suicidal thoughts 0   PHQ-9 Score 3      Psychosocial Evaluation and Intervention:   Psychosocial Re-Evaluation:      Psychosocial Re-Evaluation      03/11/15 1728 03/29/15 1635 04/01/15 1649 04/14/15 1510 04/23/15 1348   Psychosocial Re-Evaluation   Interventions Encouraged to attend Cardiac Rehabilitation for the exercise Encouraged to attend Cardiac Rehabilitation for the exercise Encouraged to attend Cardiac Rehabilitation for the exercise;Stress management education  Encouraged to attend Cardiac Rehabilitation for the exercise   Comments Geovanie has a $40 copay each time he attends Cardiac Rehab. He did not say he wanted to leave yet and he mentioned the $40 copay.  Stressed due to passing out at home on 10/24 and in [place] seen later follow up by Dr. Sabra Heck.  Discussed with his wife self care for both of them since "we seem to have a doctors appt weekly and for months we have seen Dr. Emily Filbert weekly". Mrs. Antczak drives for her 25 year old husband now. Mr. Gotshall said he has had to miss some Cardiac Rehab since he has had so many doctors appts. [name] and his wife said it has been stressful to go to MD alot to figure out what is wrong with him.  [name] has been out recently having physical therapy.    Continued Psychosocial Services Needed Yes  Yes       05/20/15 1629           Psychosocial Re-Evaluation   Comments Kymani has been out a while.           Vocational Rehabilitation: Provide vocational rehab assistance to qualifying candidates.   Vocational Rehab Evaluation & Intervention:     Vocational Rehab - 02/23/15 1107    Initial Vocational Rehab Evaluation & Intervention   Assessment shows need for Vocational Rehabilitation No      Education: Education Goals: Education classes will be provided on a weekly basis, covering required topics. Participant will state understanding/return demonstration of topics presented.  Learning Barriers/Preferences:     Learning Barriers/Preferences - 02/23/15 1503    Learning Barriers/Preferences   Learning Barriers Hearing      Education  Topics: General Nutrition Guidelines/Fats and Fiber: -Group instruction provided by verbal, written material, models and posters to present the general guidelines for heart healthy nutrition. Gives an explanation and review of dietary fats and fiber.          Cardiac Rehab from 03/01/2015 in [facility] Cardiac and Pulmonary Rehab   Date  03/01/15   Educator  PI   Instruction Review Code  2- meets goals/outcomes      Controlling Sodium/Reading Food Labels: -Group verbal and written material supporting the discussion of sodium use in heart healthy nutrition. Review and explanation with models, verbal and written materials for utilization of the food label.   Exercise Physiology & Risk Factors: - Group verbal and written instruction with models to review the exercise physiology of the cardiovascular system and associated critical values. Details cardiovascular disease risk factors and the goals associated with each risk factor.   Aerobic Exercise & Resistance Training: - Gives group verbal and written discussion on the health impact of inactivity. On the components of aerobic and resistive training programs and the benefits of this training  and how to safely progress through these programs.   Flexibility, Balance, General Exercise Guidelines: - Provides group verbal and written instruction on the benefits of flexibility and balance training programs. Provides general exercise guidelines with specific guidelines to those with heart or lung disease. Demonstration and skill practice provided.   Stress Management: - Provides group verbal and written instruction about the health risks of elevated stress, cause of high stress, and healthy ways to reduce stress.   Depression: - Provides group verbal and written instruction on the correlation between heart/lung disease and depressed mood, treatment options, and the stigmas associated with seeking treatment.   Anatomy & Physiology of the Heart: -  Group verbal and written instruction and models provide basic cardiac anatomy and physiology, with the coronary electrical and arterial systems. Review of: AMI, Angina, Valve disease, Heart Failure, Cardiac Arrhythmia, Pacemakers, and the ICD.   Cardiac Procedures: - Group verbal and written instruction and models to describe the testing methods done to diagnose heart disease. Reviews the outcomes of the test results. Describes the treatment choices: Medical Management, Angioplasty, or Coronary Bypass Surgery.   Cardiac Medications: - Group verbal and written instruction to review commonly prescribed medications for heart disease. Reviews the medication, class of the drug, and side effects. Includes the steps to properly store meds and maintain the prescription regimen.   Go Sex-Intimacy & Heart Disease, Get SMART - Goal Setting: - Group verbal and written instruction through game format to discuss heart disease and the return to sexual intimacy. Provides group verbal and written material to discuss and apply goal setting through the application of the S.M.A.R.T. Method.   Other Matters of the Heart: - Provides group verbal, written materials and models to describe Heart Failure, Angina, Valve Disease, and Diabetes in the realm of heart disease. Includes description of the disease process and treatment options available to the cardiac patient.   Exercise & Equipment Safety: - Individual verbal instruction and demonstration of equipment use and safety with use of the equipment.      Cardiac Rehab from 03/01/2015 in [facility] Cardiac and Pulmonary Rehab   Date  02/23/15   Educator  C. Enterkin,RN   Instruction Review Code  1- partially meets, needs review/practice      Infection Prevention: - Provides verbal and written material to individual with discussion of infection control including proper hand washing and proper equipment cleaning during exercise session.      Cardiac Rehab from  03/01/2015 in [facility] Cardiac and Pulmonary Rehab   Date  02/23/15   Educator  C. Enterkin,RN   Instruction Review Code  2- meets goals/outcomes      Falls Prevention: - Provides verbal and written material to individual with discussion of falls prevention and safety.      Cardiac Rehab from 03/01/2015 in [facility] Cardiac and Pulmonary Rehab   Date  02/23/15   Educator  C. Enterkin,RN   Instruction Review Code  2- meets goals/outcomes      Diabetes: - Individual verbal and written instruction to review signs/symptoms of diabetes, desired ranges of glucose level fasting, after meals and with exercise. Advice that pre and post exercise glucose checks will be done for 3 sessions at entry of program.    Knowledge Questionnaire Score:     Knowledge Questionnaire Score - 02/23/15 1503    Knowledge Questionnaire Score   Pre Score Not done by patient      Personal Goals and Risk Factors at Admission:     Personal  Goals and Risk Factors at Admission - 02/24/15 1410    Personal Goals and Risk Factors on Admission    Weight Management No   Intervention Learn and follow the exercise and diet guidelines while in the program. Utilize the nutrition and education classes to help gain knowledge of the diet and exercise expectations in the program   Admit Weight 178 lb (80.74 kg)  [name] reported Dr. Sabra Heck stopped his fluid pills since his weight was down 12 lbs and sodium was too low.    Goal Weight 190 lb (86.183 kg)      Personal Goals and Risk Factors Review:      Goals and Risk Factor Review      03/03/15 1837 03/11/15 1727 03/18/15 1705 03/29/15 1635 03/29/15 1758   Increase Aerobic Exercise and Physical Activity   Goals Progress/Improvement seen  Yes Yes Yes  Yes   Comments  Westin feels he isn't getting strength like he would like to but Shy said he did not like taking the Paxil that the doctor suggested. Jajaun reports that he has another MD appt with Dr. Emily Filbert next week.   [name] says his wife wants him to get more strength but he did alot on REL today and got tired. Is still dizzy at times so kept him off treadmill.  Stressed due to passing out at home on 10/24 and in [place] seen later follow up by Dr. Sabra Heck.  Did 30 minutes of exercise but [name] mentioned he was worried about passing out so I took him to his car in a wheelchair for his wife to drive him home. During exercising sitting on the Nustep his blood pressure dropped to 90/60 but came up to 120/70 with 240 cc of water he drank for me.      04/01/15 1637 04/01/15 1648 04/14/15 1510 04/23/15 1347 05/20/15 1629   Increase Aerobic Exercise and Physical Activity   Goals Progress/Improvement seen  Yes Yes No Yes No   Comments [name] reports that Dr. Emily Filbert is having him eat more sodium since his sodium levels are low.  Although [name] did not stay today to do weights, he did 30+ minutes exercising sitting on the Nustep.[name] called back and said Dr.Mark Sabra Heck suggested Physical therapy for him so he won't be in Cardiac Rehab for a while.  [name] has been out recently having physical therapy.  [name] has been out for a while from Cardiac Rehab.      06/07/15 1021           Increase Aerobic Exercise and Physical Activity   Goals Progress/Improvement seen  No       Comments Jack's Ex. Rx. will be re-evaluated upon his return due to the extended absence          Personal Goals Discharge (Final Personal Goals and Risk Factors Review):      Goals and Risk Factor Review - 06/07/15 1021    Increase Aerobic Exercise and Physical Activity   Goals Progress/Improvement seen  No   Comments Jack's Ex. Rx. will be re-evaluated upon his return due to the extended absence      ITP Comments:     ITP Comments      05/23/15 1346 06/17/15 1332 07/13/15 0704       ITP Comments Ready for 30 day review.  Continue with ITP Ready for 30 day review. Continue with ITP.  Has been absent since 04/01/2015 calls have been made  to  check on return to program.  No response yet. Ready for 30 day review. Continue with ITP.  Has been absent since 04/01/2015 calls have been made to check on return to program.  No response yet.        Comments:

## 2015-07-15 NOTE — Progress Notes (Signed)
Cardiac Individual Treatment Plan  Patient Details  Name: Jacob Reyes MRN: [id number] Date of Birth: 05-04-1926 Referring Provider:  No ref. provider found  Initial Encounter Date:    Visit Diagnosis: No diagnosis found.  Patient's Home Medications on Admission:  Current outpatient prescriptions:  .  aspirin EC 81 MG tablet, Take 81 mg by mouth daily., Disp: , Rfl:  .  brimonidine-timolol (COMBIGAN) 0.2-0.5 % ophthalmic solution, Place 1 drop into both eyes 2 (two) times daily., Disp: , Rfl:  .  cholecalciferol (VITAMIN D) 1000 UNITS tablet, Take 1,000 Units by mouth daily., Disp: , Rfl:  .  finasteride (PROSCAR) 5 MG tablet, Take 5 mg by mouth daily after supper. , Disp: , Rfl:  .  levothyroxine (SYNTHROID, LEVOTHROID) 75 MCG tablet, Take 75 mcg by mouth daily before breakfast. , Disp: , Rfl:  .  LORazepam (ATIVAN) 0.5 MG tablet, Take 0.5 mg by mouth 2 (two) times daily., Disp: , Rfl:  .  lovastatin (MEVACOR) 40 MG tablet, Take 40 mg by mouth daily after supper. , Disp: , Rfl:  .  LUMIGAN 0.01 % SOLN, Place 1 drop into both eyes at bedtime. , Disp: , Rfl:  .  magnesium chloride (SLOW-MAG) 64 MG TBEC SR tablet, Take 2 tablets (128 mg total) by mouth daily., Disp: 60 tablet, Rfl:  .  omeprazole (PRILOSEC) 20 MG capsule, Take 20 mg by mouth daily before breakfast. , Disp: , Rfl:  .  polyethylene glycol powder (GLYCOLAX/MIRALAX) powder, Take 17 g by mouth daily. , Disp: , Rfl:  .  tamsulosin (FLOMAX) 0.4 MG CAPS capsule, Take 0.4 mg by mouth 2 (two) times daily. , Disp: , Rfl:   Past Medical History: Past Medical History  Diagnosis Date  . Glaucoma   . Hearing loss   . Reflux   . GERD (gastroesophageal reflux disease)   . Cancer [facility])     prostate  . Colon cancer ([place])   . Coronary artery disease   . Hypertension   . Abnormal stress test   . Depression   . Anxiety     just started med. for anxiety   . History of hiatal hernia     Tobacco Use: History  Smoking status   . Never Smoker   Smokeless tobacco  . Never Used    Labs: Recent Review Flowsheet Data    Labs for ITP Cardiac and Pulmonary Rehab Latest Ref Rng 12/28/2014 12/28/2014 12/29/2014 12/29/2014 12/29/2014   PHART 7.350 - 7.450 - 7.452(H) 7.411 7.418 -   PCO2ART 35.0 - 45.0 mmHg - 35.2 32.5(L) 31.2(L) -   HCO3 20.0 - 24.0 mEq/L - 24.5(H) 20.7 20.2 -   TCO2 0 - 100 mmol/L 21 26 22 21 20    ACIDBASEDEF 0.0 - 2.0 mmol/L - - 4.0(H) 4.0(H) -   O2SAT - - 99.0 96.0 93.0 -       Exercise Target Goals:    Exercise Program Goal: Individual exercise prescription set with THRR, safety & activity barriers. Participant demonstrates ability to understand and report RPE using BORG scale, to self-measure pulse accurately, and to acknowledge the importance of the exercise prescription.  Exercise Prescription Goal: Starting with aerobic activity 30 plus minutes a day, 3 days per week for initial exercise prescription. Provide home exercise prescription and guidelines that participant acknowledges understanding prior to discharge.  Activity Barriers & Risk Stratification:     Activity Barriers & Cardiac Risk Stratification - 02/23/15 1105    Activity Barriers & Cardiac Risk  Stratification   Activity Barriers None   Cardiac Risk Stratification High      6 Minute Walk:     6 Minute Walk      02/23/15 1438       6 Minute Walk   Phase Initial     Distance 650 feet     Walk Time 6 minutes     RPE 12     Symptoms No     Resting HR 84 bpm     Resting BP 110/70 mmHg     Max Ex. HR 110 bpm     Max Ex. BP 132/62 mmHg        Initial Exercise Prescription:     Initial Exercise Prescription - 02/23/15 1400    Date of Initial Exercise Prescription   Date 02/23/15   Treadmill   MPH 1.2   Grade 0   Minutes 10  Use intervals to complete time goals if necessary   Bike   Level 0.2   Minutes 10   Recumbant Bike   Level 3   RPM 40   Watts 25   Minutes 15   NuStep   Level 2   Watts 30    Minutes 15   Arm Ergometer   Level 1   Watts 8   Minutes 10   Arm/Foot Ergometer   Level 4   Watts 15   Minutes 10   Cybex   Level 1   RPM 50   Minutes 10   Recumbant Elliptical   Level 1   RPM 40   Watts 10   Minutes 10   Elliptical   Level 1   Speed 3   Minutes 1   REL-XR   Level 2   Watts 30   Minutes 15   Prescription Details   Frequency (times per week) 3   Duration Progress to 30 minutes of continuous aerobic without signs/symptoms of physical distress   Intensity   THRR REST +  30   Ratings of Perceived Exertion 11-15   Progression Continue progressive overload as per policy without signs/symptoms or physical distress.   Resistance Training   Training Prescription Yes   Weight 2   Reps 10-15      Exercise Prescription Changes:     Exercise Prescription Changes      03/10/15 1600 03/17/15 1800 03/22/15 1200 04/01/15 1442 05/18/15 0800   Exercise Review   Progression   Yes No  Only 2 visits recorded this month No  Absent since 04/01/15   Response to Exercise   Blood Pressure (Admit)   118/78 mmHg 122/78 mmHg    Blood Pressure (Exercise)   158/80 mmHg 142/80 mmHg    Blood Pressure (Exit)   132/84 mmHg 124/70 mmHg    Heart Rate (Admit)   76 bpm 102 bpm    Heart Rate (Exercise)   98 bpm 119 bpm    Heart Rate (Exit)   81 bpm 97 bpm    Rating of Perceived Exertion (Exercise)   13 13    Symptoms   None Feeling weak and dizzy    Comments   Reviewed individualized exercise prescription and made increases per departmental policy. Exercise increases were discussed with the patient and they were able to perform the new work loads without issue (no signs or symptoms).  Jacob Reyes has only had 2 visits this month and was feeling dizzy during his last two visits so the current goal is to maintain current workloads.  Duration   Progress to 30 minutes of continuous aerobic without signs/symptoms of physical distress Progress to 30 minutes of continuous aerobic without  signs/symptoms of physical distress Progress to 30 minutes of continuous aerobic without signs/symptoms of physical distress   Intensity   Rest + 30 Rest + 30 Rest + 30   Progression   Continue progressive overload as per policy without signs/symptoms or physical distress. Continue progressive overload as per policy without signs/symptoms or physical distress. Continue progressive overload as per policy without signs/symptoms or physical distress.   Resistance Training   Training Prescription   Yes Yes Yes   Weight   3 3 3    Reps   10-15 10-15 10-15   Interval Training   Interval Training   No No No   NuStep   Level    2  T5 NuStep 2  T5 NuStep   Watts    20 20   Minutes    20 20   Recumbant Elliptical   Level 4 5 5 5   BioStep 5  BioStep   Watts 25 30 30 30 30    Minutes 30 30 30 30 30      06/07/15 1000 07/06/15 0600         Exercise Review   Progression No  Absent since 04/01/15 No  Absent since 04/01/15      Response to Exercise   Comments  Ex. Rx. will need to be re-evaluated upon return due to extended absence      Duration Progress to 30 minutes of continuous aerobic without signs/symptoms of physical distress Progress to 30 minutes of continuous aerobic without signs/symptoms of physical distress      Intensity Rest + 30 Rest + 30      Progression Continue progressive overload as per policy without signs/symptoms or physical distress. Continue progressive overload as per policy without signs/symptoms or physical distress.      Resistance Training   Training Prescription Yes Yes      Weight 3 3      Reps 10-15 10-15      Interval Training   Interval Training No No      NuStep   Level 2  T5 NuStep 2  T5 NuStep      Watts 20 20      Minutes 20 20      Recumbant Elliptical   Level 5  BioStep 5  BioStep      Watts 30 30      Minutes 30 30         Discharge Exercise Prescription (Final Exercise Prescription Changes):     Exercise Prescription Changes -  07/06/15 0600    Exercise Review   Progression No  Absent since 04/01/15   Response to Exercise   Comments Ex. Rx. will need to be re-evaluated upon return due to extended absence   Duration Progress to 30 minutes of continuous aerobic without signs/symptoms of physical distress   Intensity Rest + 30   Progression Continue progressive overload as per policy without signs/symptoms or physical distress.   Resistance Training   Training Prescription Yes   Weight 3   Reps 10-15   Interval Training   Interval Training No   NuStep   Level 2  T5 NuStep   Watts 20   Minutes 20   Recumbant Elliptical   Level 5  BioStep   Watts 30   Minutes 30      Nutrition:  Target Goals: Understanding of  nutrition guidelines, daily intake of sodium 1500mg , cholesterol 200mg , calories 30% from fat and 7% or less from saturated fats, daily to have 5 or more servings of fruits and vegetables.  Biometrics:     Pre Biometrics - 02/23/15 1437    Pre Biometrics   Height 6' 0.2" (1.834 m)   Weight 185 lb 12.8 oz (84.278 kg)   Waist Circumference 40.5 inches   Hip Circumference 44 inches   Waist to Hip Ratio 0.92 %   BMI (Calculated) 25.1       Nutrition Therapy Plan and Nutrition Goals:     Nutrition Therapy & Goals - 02/26/15 1124    Nutrition Therapy   Diet Patient declined MNT at this time.       Nutrition Discharge: Rate Your Plate Scores:     Nutrition Assessments - 02/23/15 1505    Rate Your Plate Scores   Pre Score --  Not done by patient age 45      Nutrition Goals Re-Evaluation:     Nutrition Goals Re-Evaluation      03/11/15 1726 03/29/15 1632 04/01/15 1647 04/14/15 1509 04/23/15 1347   Personal Goal #1 Re-Evaluation   Personal Goal #1 Jacob Reyes cont to eat well.  Is now willing to meet individually with the registered dietician.  "Jacob Reyes" Hoyle now reports that Dr. Emily Filbert said his sodium level was low so he is suppose to eat foods high  in sodium. His wife reports  that he is also on a strict fluid restriction that incl what he drinks taking his pills, any coffe he drinks etc.      Goal Progress Seen Yes Yes Yes Yes Yes   Comments    Still eats healthy Jacob Reyes said.  Jacob Reyes has been out recently having physical therapy.      05/20/15 1629 07/15/15 1527         Personal Goal #1 Re-Evaluation   Goal Progress Seen No Yes      Comments Jacob Reyes has been out for a long time. Jacob Reyes reports via phone he is much better eating etc even walking without a cane now. Working out at [employer] with his wife.          Psychosocial: Target Goals: Acknowledge presence or absence of depression, maximize coping skills, provide positive support system. Participant is able to verbalize types and ability to use techniques and skills needed for reducing stress and depression.  Initial Review & Psychosocial Screening:     Initial Psych Review & Screening - 02/23/15 1109    Initial Review   Current issues with Current Sleep Concerns   Family Dynamics   Good Support System? Yes   Comments Discussed maybe sleeping in recliner since his upper chest has some burning where he removed an artery to use. Dr. Jobie Quaker told them some people have that and sit in waiting room with their shirts open to not touch that site. (patient reports)      Quality of Life Scores:     Quality of Life - 02/23/15 1506    Quality of Life Scores   Health/Function Pre --  Not done by patient. Jacob Reyes said he is 28 and really doesn't have any major problems at his age and his wife agreed.       PHQ-9:     Recent Review Flowsheet Data    Depression screen [facility] 2/9 02/23/2015   Decreased Interest 0   Down, Depressed, Hopeless 0   PHQ - 2 Score 0  Altered sleeping 3   Tired, decreased energy 0   Change in appetite 0   Feeling bad or failure about yourself  0   Trouble concentrating 0   Moving slowly or fidgety/restless 0   Suicidal thoughts 0   PHQ-9 Score 3      Psychosocial Evaluation and  Intervention:   Psychosocial Re-Evaluation:     Psychosocial Re-Evaluation      03/11/15 1728 03/29/15 1635 04/01/15 1649 04/14/15 1510 04/23/15 1348   Psychosocial Re-Evaluation   Interventions Encouraged to attend Cardiac Rehabilitation for the exercise Encouraged to attend Cardiac Rehabilitation for the exercise Encouraged to attend Cardiac Rehabilitation for the exercise;Stress management education  Encouraged to attend Cardiac Rehabilitation for the exercise   Comments Jacob Reyes has a $40 copay each time he attends Cardiac Rehab. He did not say he wanted to leave yet and he mentioned the $40 copay.  Stressed due to passing out at home on 10/24 and in [place] seen later follow up by Dr. Sabra Heck.  Discussed with his wife self care for both of them since "we seem to have a doctors appt weekly and for months we have seen Dr. Emily Filbert weekly". Mrs. Quist drives for her 55 year old husband now. Jacob Reyes said he has had to miss some Cardiac Rehab since he has had so many doctors appts. Jacob Reyes and his wife said it has been stressful to go to MD alot to figure out what is wrong with him.  Jacob Reyes has been out recently having physical therapy.    Continued Psychosocial Services Needed Yes  Yes       05/20/15 1629 07/15/15 1528         Psychosocial Re-Evaluation   Comments Jacob Reyes has been out a while.  Jacob Reyes reports via phone he is much better eating etc even walking without a cane now. Working out at [employer] with his wife.          Vocational Rehabilitation: Provide vocational rehab assistance to qualifying candidates.   Vocational Rehab Evaluation & Intervention:     Vocational Rehab - 02/23/15 1107    Initial Vocational Rehab Evaluation & Intervention   Assessment shows need for Vocational Rehabilitation No      Education: Education Goals: Education classes will be provided on a weekly basis, covering required topics. Participant will state understanding/return demonstration of topics  presented.  Learning Barriers/Preferences:     Learning Barriers/Preferences - 02/23/15 1503    Learning Barriers/Preferences   Learning Barriers Hearing      Education Topics: General Nutrition Guidelines/Fats and Fiber: -Group instruction provided by verbal, written material, models and posters to present the general guidelines for heart healthy nutrition. Gives an explanation and review of dietary fats and fiber.          Cardiac Rehab from 03/01/2015 in [facility] Cardiac and Pulmonary Rehab   Date  03/01/15   Educator  PI   Instruction Review Code  2- meets goals/outcomes      Controlling Sodium/Reading Food Labels: -Group verbal and written material supporting the discussion of sodium use in heart healthy nutrition. Review and explanation with models, verbal and written materials for utilization of the food label.   Exercise Physiology & Risk Factors: - Group verbal and written instruction with models to review the exercise physiology of the cardiovascular system and associated critical values. Details cardiovascular disease risk factors and the goals associated with each risk factor.   Aerobic Exercise & Resistance Training: - Gives group  verbal and written discussion on the health impact of inactivity. On the components of aerobic and resistive training programs and the benefits of this training and how to safely progress through these programs.   Flexibility, Balance, General Exercise Guidelines: - Provides group verbal and written instruction on the benefits of flexibility and balance training programs. Provides general exercise guidelines with specific guidelines to those with heart or lung disease. Demonstration and skill practice provided.   Stress Management: - Provides group verbal and written instruction about the health risks of elevated stress, cause of high stress, and healthy ways to reduce stress.   Depression: - Provides group verbal and written instruction  on the correlation between heart/lung disease and depressed mood, treatment options, and the stigmas associated with seeking treatment.   Anatomy & Physiology of the Heart: - Group verbal and written instruction and models provide basic cardiac anatomy and physiology, with the coronary electrical and arterial systems. Review of: AMI, Angina, Valve disease, Heart Failure, Cardiac Arrhythmia, Pacemakers, and the ICD.   Cardiac Procedures: - Group verbal and written instruction and models to describe the testing methods done to diagnose heart disease. Reviews the outcomes of the test results. Describes the treatment choices: Medical Management, Angioplasty, or Coronary Bypass Surgery.   Cardiac Medications: - Group verbal and written instruction to review commonly prescribed medications for heart disease. Reviews the medication, class of the drug, and side effects. Includes the steps to properly store meds and maintain the prescription regimen.   Go Sex-Intimacy & Heart Disease, Get SMART - Goal Setting: - Group verbal and written instruction through game format to discuss heart disease and the return to sexual intimacy. Provides group verbal and written material to discuss and apply goal setting through the application of the S.M.A.R.T. Method.   Other Matters of the Heart: - Provides group verbal, written materials and models to describe Heart Failure, Angina, Valve Disease, and Diabetes in the realm of heart disease. Includes description of the disease process and treatment options available to the cardiac patient.   Exercise & Equipment Safety: - Individual verbal instruction and demonstration of equipment use and safety with use of the equipment.      Cardiac Rehab from 03/01/2015 in [facility] Cardiac and Pulmonary Rehab   Date  02/23/15   Educator  C. Elma Shands,RN   Instruction Review Code  1- partially meets, needs review/practice      Infection Prevention: - Provides verbal and  written material to individual with discussion of infection control including proper hand washing and proper equipment cleaning during exercise session.      Cardiac Rehab from 03/01/2015 in [facility] Cardiac and Pulmonary Rehab   Date  02/23/15   Educator  C. Dequandre Cordova,RN   Instruction Review Code  2- meets goals/outcomes      Falls Prevention: - Provides verbal and written material to individual with discussion of falls prevention and safety.      Cardiac Rehab from 03/01/2015 in [facility] Cardiac and Pulmonary Rehab   Date  02/23/15   Educator  C. Bilan Tedesco,RN   Instruction Review Code  2- meets goals/outcomes      Diabetes: - Individual verbal and written instruction to review signs/symptoms of diabetes, desired ranges of glucose level fasting, after meals and with exercise. Advice that pre and post exercise glucose checks will be done for 3 sessions at entry of program.    Knowledge Questionnaire Score:     Knowledge Questionnaire Score - 02/23/15 1503    Knowledge Questionnaire Score  Pre Score Not done by patient      Personal Goals and Risk Factors at Admission:     Personal Goals and Risk Factors at Admission - 02/24/15 1410    Core Components/Risk Factors/Patient Goals on Admission    Weight Management No   Intervention (read-only) Learn and follow the exercise and diet guidelines while in the program. Utilize the nutrition and education classes to help gain knowledge of the diet and exercise expectations in the program   Admit Weight 178 lb (80.74 kg)  Jacob Reyes reported Dr. Sabra Heck stopped his fluid pills since his weight was down 12 lbs and sodium was too low.    Goal Weight: Short Term 190 lb (86.183 kg)      Personal Goals and Risk Factors Review:      Goals and Risk Factor Review      03/03/15 1837 03/11/15 1727 03/18/15 1705 03/29/15 1635 03/29/15 1758   Core Components/Risk Factors/Patient Goals Review   Personal Goals Review Increase Aerobic Exercise and Physical  Activity Increase Aerobic Exercise and Physical Activity      Increase Aerobic Exercise and Physical Activity (read-only)   Goals Progress/Improvement seen  Yes Yes Yes  Yes   Comments  Jacob Reyes feels he isn't getting strength like he would like to but Jacob Reyes said he did not like taking the Paxil that the doctor suggested. [place] reports that he has another MD appt with Dr. Emily Filbert next week.  Jacob Reyes says his wife wants him to get more strength but he did alot on REL today and got tired. Is still dizzy at times so kept him off treadmill.  Stressed due to passing out at home on 10/24 and in [place] seen later follow up by Dr. Sabra Heck.  Did 30 minutes of exercise but Jacob Reyes mentioned he was worried about passing out so I took him to his car in a wheelchair for his wife to drive him home. During exercising sitting on the Nustep his blood pressure dropped to 90/60 but came up to 120/70 with 240 cc of water he drank for me.      04/01/15 1637 04/01/15 1648 04/14/15 1510 04/23/15 1347 05/20/15 1629   Core Components/Risk Factors/Patient Goals Review   Personal Goals Review Increase Aerobic Exercise and Physical Activity       Increase Aerobic Exercise and Physical Activity (read-only)   Goals Progress/Improvement seen  Yes Yes No Yes No   Comments Jacob Reyes reports that Dr. Emily Filbert is having him eat more sodium since his sodium levels are low.  Although Jacob Reyes did not stay today to do weights, he did 30+ minutes exercising sitting on the Nustep.Jacob Reyes called back and said Dr.Mark Sabra Heck suggested Physical therapy for him so he won't be in Cardiac Rehab for a while.  Jacob Reyes has been out recently having physical therapy.  Jacob Reyes has been out for a while from Cardiac Rehab.      06/07/15 1021 07/15/15 1528         Core Components/Risk Factors/Patient Goals Review   Personal Goals Review  Sedentary      Review  Jacob Reyes reports via phone he is much better eating etc even walking without a cane now. Working out at [employer] with his wife.       Expected Outcomes  Jacob Reyes reports via phone he is much better eating etc even walking without a cane now. Working out at [employer] with his wife.       Increase  Aerobic Exercise and Physical Activity (read-only)   Goals Progress/Improvement seen  No       Comments Jacob Reyes's Ex. Rx. will be re-evaluated upon his return due to the extended absence          Personal Goals Discharge (Final Personal Goals and Risk Factors Review):      Goals and Risk Factor Review - 07/15/15 1528    Core Components/Risk Factors/Patient Goals Review   Personal Goals Review Sedentary   Review Jacob Reyes reports via phone he is much better eating etc even walking without a cane now. Working out at [employer] with his wife.    Expected Outcomes Jacob Reyes reports via phone he is much better eating etc even walking without a cane now. Working out at [employer] with his wife.       ITP Comments:     ITP Comments      05/23/15 1346 06/17/15 1332 07/13/15 0704       ITP Comments Ready for 30 day review.  Continue with ITP Ready for 30 day review. Continue with ITP.  Has been absent since 04/01/2015 calls have been made to check on return to program.  No response yet. Ready for 30 day review. Continue with ITP.  Has been absent since 04/01/2015 calls have been made to check on return to program.  No response yet.        Comments: I called Jacob Reyes. Jacob Reyes reports via phone he is much better eating etc even walking without a cane now. Working out at [employer] with his wife.

## 2015-07-15 NOTE — Addendum Note (Signed)
Addended by: Gerlene Burdock on: 07/15/2015 03:31 PM   Modules accepted: Orders

## 2015-07-15 NOTE — Progress Notes (Signed)
Discharge Summary  Patient Details  Name: Jacob Reyes MRN: [id number] Date of Birth: [birth year] Referring Provider:  No ref. provider found   Number of Visits: 11/36  Reason for Discharge:  Early Exit:  Personal  Smoking History:  History  Smoking status  . Never Smoker   Smokeless tobacco  . Never Used    Diagnosis:  S/P CABG x 6 - Plan: CARDIAC REHAB 30 DAY REVIEW  ADL [facility]:   Initial Exercise Prescription:     Initial Exercise Prescription - 02/23/15 1400    Date of Initial Exercise Prescription   Date 02/23/15   Treadmill   MPH 1.2   Grade 0   Minutes 10  Use intervals to complete time goals if necessary   Bike   Level 0.2   Minutes 10   Recumbant Bike   Level 3   RPM 40   Watts 25   Minutes 15   NuStep   Level 2   Watts 30   Minutes 15   Arm Ergometer   Level 1   Watts 8   Minutes 10   Arm/Foot Ergometer   Level 4   Watts 15   Minutes 10   Cybex   Level 1   RPM 50   Minutes 10   Recumbant Elliptical   Level 1   RPM 40   Watts 10   Minutes 10   Elliptical   Level 1   Speed 3   Minutes 1   REL-XR   Level 2   Watts 30   Minutes 15   Prescription Details   Frequency (times per week) 3   Duration Progress to 30 minutes of continuous aerobic without signs/symptoms of physical distress   Intensity   THRR REST +  30   Ratings of Perceived Exertion 11-15   Progression Continue progressive overload as per policy without signs/symptoms or physical distress.   Resistance Training   Training Prescription Yes   Weight 2   Reps 10-15      Discharge Exercise Prescription (Final Exercise Prescription Changes):     Exercise Prescription Changes - 07/06/15 0600    Exercise Review   Progression No  Absent since 04/01/15   Response to Exercise   Comments Ex. Rx. will need to be re-evaluated upon return due to extended absence   Duration Progress to 30 minutes of continuous aerobic without signs/symptoms of physical distress   Intensity Rest + 30   Progression Continue progressive overload as per policy without signs/symptoms or physical distress.   Resistance Training   Training Prescription Yes   Weight 3   Reps 10-15   Interval Training   Interval Training No   NuStep   Level 2  T5 NuStep   Watts 20   Minutes 20   Recumbant Elliptical   Level 5  BioStep   Watts 30   Minutes 30      Functional Capacity:     6 Minute Walk      02/23/15 1438       6 Minute Walk   Phase Initial     Distance 650 feet     Walk Time 6 minutes     RPE 12     Symptoms No     Resting HR 84 bpm     Resting BP 110/70 mmHg     Max Ex. HR 110 bpm     Max Ex. BP 132/62 mmHg  Psychological, QOL, Others - Outcomes: PHQ 2/9: Depression screen PHQ 2/9 02/23/2015  Decreased Interest 0  Down, Depressed, Hopeless 0  PHQ - 2 Score 0  Altered sleeping 3  Tired, decreased energy 0  Change in appetite 0  Feeling bad or failure about yourself  0  Trouble concentrating 0  Moving slowly or fidgety/restless 0  Suicidal thoughts 0  PHQ-9 Score 3    Quality of Life:     Quality of Life - 02/23/15 1506    Quality of Life Scores   Health/Function Pre --  Not done by patient. [name] said he is 82 and really doesn't have any major problems at his age and his wife agreed.       Personal Goals: Goals established at orientation with interventions provided to work toward goal.     Personal Goals and Risk Factors at Admission - 02/24/15 1410    Core Components/Risk Factors/Patient Goals on Admission    Weight Management No   Intervention (read-only) Learn and follow the exercise and diet guidelines while in the program. Utilize the nutrition and education classes to help gain knowledge of the diet and exercise expectations in the program   Admit Weight 178 lb (80.74 kg)  [name] reported Dr. Sabra Heck stopped his fluid pills since his weight was down 12 lbs and sodium was too low.    Goal Weight: Short Term 190 lb  (86.183 kg)       Personal Goals Discharge:     Goals and Risk Factor Review      03/03/15 1837 03/11/15 1727 03/18/15 1705 03/29/15 1635 03/29/15 1758   Core Components/Risk Factors/Patient Goals Review   Personal Goals Review Increase Aerobic Exercise and Physical Activity Increase Aerobic Exercise and Physical Activity      Increase Aerobic Exercise and Physical Activity (read-only)   Goals Progress/Improvement seen  Yes Yes Yes  Yes   Comments  Zai feels he isn't getting strength like he would like to but [place] said he did not like taking the Paxil that the doctor suggested. Shaheem reports that he has another MD appt with Dr. Emily Filbert next week.  [name] says his wife wants him to get more strength but he did alot on REL today and got tired. Is still dizzy at times so kept him off treadmill.  Stressed due to passing out at home on 10/24 and in [place] seen later follow up by Dr. Sabra Heck.  Did 30 minutes of exercise but [name] mentioned he was worried about passing out so I took him to his car in a wheelchair for his wife to drive him home. During exercising sitting on the Nustep his blood pressure dropped to 90/60 but came up to 120/70 with 240 cc of water he drank for me.      04/01/15 1637 04/01/15 1648 04/14/15 1510 04/23/15 1347 05/20/15 1629   Core Components/Risk Factors/Patient Goals Review   Personal Goals Review Increase Aerobic Exercise and Physical Activity       Increase Aerobic Exercise and Physical Activity (read-only)   Goals Progress/Improvement seen  Yes Yes No Yes No   Comments [name] reports that Dr. Emily Filbert is having him eat more sodium since his sodium levels are low.  Although [name] did not stay today to do weights, he did 30+ minutes exercising sitting on the Nustep.[name] called back and said Dr.Mark Sabra Heck suggested Physical therapy for him so he won't be in Cardiac Rehab for a while.  [name] has  been out recently having physical therapy.  [name] has been out for a  while from Cardiac Rehab.      06/07/15 1021 07/15/15 1528         Core Components/Risk Factors/Patient Goals Review   Personal Goals Review  Sedentary      Review  Emrik reports via phone he is much better eating etc even walking without a cane now. Working out at [employer] with his wife.       Expected Outcomes  Johnmatthew reports via phone he is much better eating etc even walking without a cane now. Working out at [employer] with his wife.       Increase Aerobic Exercise and Physical Activity (read-only)   Goals Progress/Improvement seen  No       Comments Jack[facility] Ex. Rx. will be re-evaluated upon his return due to the extended absence          Nutrition & Weight - Outcomes:     Pre Biometrics - 02/23/15 1437    Pre Biometrics   Height 6' 0.2" (1.834 m)   Weight 185 lb 12.8 oz (84.278 kg)   Waist Circumference 40.5 inches   Hip Circumference 44 inches   Waist to Hip Ratio 0.92 %   BMI (Calculated) 25.1       Nutrition:     Nutrition Therapy & Goals - 02/26/15 1124    Nutrition Therapy   Diet Patient declined MNT at this time.       Nutrition Discharge:     Nutrition Assessments - 02/23/15 1505    Rate Your Plate Scores   Pre Score --  Not done by patient age 80      Education Questionnaire Score:     Knowledge Questionnaire Score - 02/23/15 1503    Knowledge Questionnaire Score   Pre Score Not done by patient      Goals reviewed with patient; copy given to patient.

## 2015-07-26 DIAGNOSIS — R5382 Chronic fatigue, unspecified: Secondary | ICD-10-CM | POA: Diagnosis not present

## 2015-07-26 DIAGNOSIS — I2510 Atherosclerotic heart disease of native coronary artery without angina pectoris: Secondary | ICD-10-CM | POA: Diagnosis not present

## 2015-07-29 ENCOUNTER — Ambulatory Visit (INDEPENDENT_AMBULATORY_CARE_PROVIDER_SITE_OTHER): Payer: PPO | Admitting: Podiatry

## 2015-07-29 ENCOUNTER — Encounter: Payer: Self-pay | Admitting: Podiatry

## 2015-07-29 DIAGNOSIS — M79673 Pain in unspecified foot: Secondary | ICD-10-CM | POA: Diagnosis not present

## 2015-07-29 DIAGNOSIS — B351 Tinea unguium: Secondary | ICD-10-CM | POA: Diagnosis not present

## 2015-07-29 DIAGNOSIS — L84 Corns and callosities: Secondary | ICD-10-CM | POA: Diagnosis not present

## 2015-07-29 NOTE — Progress Notes (Signed)
Patient ID: Jacob Reyes, male   DOB: February 19, 1926, 82 y.o.   MRN: [id number]  Subjective: 82 y.o.-year-old male returns the office today for continued care calluses to the bottoms of his feet most on the right side worse than left submetatarsal 2. He is also certain no significant he is getting calluses on the tops of his toes due to hammertoes. Denies any drainage or pus coming from the area. He has been wearing the orthotics and they're discussing she changes. No other complaints at this time in no acute changes.  Objective: AAO 3, NAD DP/PT pulses palpable, CRT less than 3 seconds Protective sensation intact with Derrel Nip monofilament Hyperkeratotic lesion right foot submetatarsal 2. Upon debridement there is no underlying ulceration, drainage or other signs of infection at this time.  There is prominent metatarsal heads with atrophy of the fat pad. Particularly the right second metatarsal head is very prominent underlying area of callus. There is also small pre-ulcerative calluses on the dorsal aspect of the PIPJ to the lesser digits on the left foot as well as the right third toe. There is also a frontal plane deformity to the toes on the right side due to the amputation. There has been a previous second toe amputation. No other open lesions or pre-ulcer lesions. Nails are hypertrophic, dystrophic, brittle, discolored, elongated 9. His tenderness in nails 1-5 bilaterally except for the right second toe is been previously be today. No surrounding erythema or drainage. No pain with calf compression, warmth, erythema.  Assessment: Patient presents with pre-ulcerative callus right foot and symptomatic onychomycosis  Plan: -Treatment options including alternatives, risks, complications were discussed -Hyperkeratotic lesions sharply debrided without complications or bleeding and the underlying ulceration appears to be healed. Offloading pads were dispensed. Continue to inspect the area  daily.  -Offloading pads for the hammertoes. -Discussed shoe gear changes. He would likely benefit from an orthotic with a diabetic/orthopedic shoe given his foot deformity and high risk of further ulceration and amputation. -Nails debrided x 9 without complications or bleeding. -Follow-up in 6 weeks or sooner if any problems arise. In the meantime, encouraged to call the office with any questions, concerns, change in symptoms.   [name], DPM

## 2015-08-02 DIAGNOSIS — L578 Other skin changes due to chronic exposure to nonionizing radiation: Secondary | ICD-10-CM | POA: Diagnosis not present

## 2015-08-02 DIAGNOSIS — L570 Actinic keratosis: Secondary | ICD-10-CM | POA: Diagnosis not present

## 2015-08-02 DIAGNOSIS — D1800 Hemangioma unspecified site: Secondary | ICD-10-CM | POA: Diagnosis not present

## 2015-08-02 DIAGNOSIS — L820 Inflamed seborrheic keratosis: Secondary | ICD-10-CM | POA: Diagnosis not present

## 2015-08-02 DIAGNOSIS — D692 Other nonthrombocytopenic purpura: Secondary | ICD-10-CM | POA: Diagnosis not present

## 2015-08-02 DIAGNOSIS — L821 Other seborrheic keratosis: Secondary | ICD-10-CM | POA: Diagnosis not present

## 2015-08-03 ENCOUNTER — Encounter: Payer: Self-pay | Admitting: *Deleted

## 2015-08-12 NOTE — Progress Notes (Signed)
Cardiac Individual Treatment Plan  Patient Details  Name: Jacob Reyes MRN: [id number] Date of Birth: 10-16-1925 Referring Provider:  Teodoro Spray, MD  Initial Encounter Date:       Cardiac Rehab from 02/23/2015 in [facility] Cardiac and Pulmonary Rehab   Date  02/23/15      Visit Diagnosis: S/P CABG x 6 - Plan: CARDIAC REHAB 30 DAY REVIEW, CARDIAC REHAB 47 DAY REVIEW, CARDIAC REHAB 30 DAY REVIEW  Patient's Home Medications on Admission:  Current outpatient prescriptions:  .  aspirin EC 81 MG tablet, Take 81 mg by mouth daily., Disp: , Rfl:  .  brimonidine-timolol (COMBIGAN) 0.2-0.5 % ophthalmic solution, Place 1 drop into both eyes 2 (two) times daily., Disp: , Rfl:  .  cholecalciferol (VITAMIN D) 1000 UNITS tablet, Take 1,000 Units by mouth daily., Disp: , Rfl:  .  finasteride (PROSCAR) 5 MG tablet, Take 5 mg by mouth daily after supper. , Disp: , Rfl:  .  levothyroxine (SYNTHROID, LEVOTHROID) 75 MCG tablet, Take 75 mcg by mouth daily before breakfast. , Disp: , Rfl:  .  LORazepam (ATIVAN) 0.5 MG tablet, Take 0.5 mg by mouth 2 (two) times daily., Disp: , Rfl:  .  lovastatin (MEVACOR) 40 MG tablet, Take 40 mg by mouth daily after supper. , Disp: , Rfl:  .  LUMIGAN 0.01 % SOLN, Place 1 drop into both eyes at bedtime. , Disp: , Rfl:  .  magnesium chloride (SLOW-MAG) 64 MG TBEC SR tablet, Take 2 tablets (128 mg total) by mouth daily., Disp: 60 tablet, Rfl:  .  omeprazole (PRILOSEC) 20 MG capsule, Take 20 mg by mouth daily before breakfast. , Disp: , Rfl:  .  polyethylene glycol powder (GLYCOLAX/MIRALAX) powder, Take 17 g by mouth daily. , Disp: , Rfl:  .  tamsulosin (FLOMAX) 0.4 MG CAPS capsule, Take 0.4 mg by mouth 2 (two) times daily. , Disp: , Rfl:   Past Medical History: Past Medical History  Diagnosis Date  . Glaucoma   . Hearing loss   . Reflux   . GERD (gastroesophageal reflux disease)   . Cancer [facility])     prostate  . Colon cancer ([place])   . Coronary artery disease   .  Hypertension   . Abnormal stress test   . Depression   . Anxiety     just started med. for anxiety   . History of hiatal hernia     Tobacco Use: History  Smoking status  . Never Smoker   Smokeless tobacco  . Never Used    Labs: Recent Review Flowsheet Data    Labs for ITP Cardiac and Pulmonary Rehab Latest Ref Rng 12/28/2014 12/28/2014 12/29/2014 12/29/2014 12/29/2014   PHART 7.350 - 7.450 - 7.452(H) 7.411 7.418 -   PCO2ART 35.0 - 45.0 mmHg - 35.2 32.5(L) 31.2(L) -   HCO3 20.0 - 24.0 mEq/L - 24.5(H) 20.7 20.2 -   TCO2 0 - 100 mmol/L 21 26 22 21 20    ACIDBASEDEF 0.0 - 2.0 mmol/L - - 4.0(H) 4.0(H) -   O2SAT - - 99.0 96.0 93.0 -       Exercise Target Goals:    Exercise Program Goal: Individual exercise prescription set with THRR, safety & activity barriers. Participant demonstrates ability to understand and report RPE using BORG scale, to self-measure pulse accurately, and to acknowledge the importance of the exercise prescription.  Exercise Prescription Goal: Starting with aerobic activity 30 plus minutes a day, 3 days per week for initial exercise  prescription. Provide home exercise prescription and guidelines that participant acknowledges understanding prior to discharge.  Activity Barriers & Risk Stratification:     Activity Barriers & Cardiac Risk Stratification - 02/23/15 1105    Activity Barriers & Cardiac Risk Stratification   Activity Barriers None   Cardiac Risk Stratification High      6 Minute Walk:     6 Minute Walk      02/23/15 1438       6 Minute Walk   Phase Initial     Distance 650 feet     Walk Time 6 minutes     RPE 12     Symptoms No     Resting HR 84 bpm     Resting BP 110/70 mmHg     Max Ex. HR 110 bpm     Max Ex. BP 132/62 mmHg        Initial Exercise Prescription:     Initial Exercise Prescription - 02/23/15 1400    Date of Initial Exercise Prescription   Date 02/23/15   Treadmill   MPH 1.2   Grade 0   Minutes 10  Use intervals  to complete time goals if necessary   Bike   Level 0.2   Minutes 10   Recumbant Bike   Level 3   RPM 40   Watts 25   Minutes 15   NuStep   Level 2   Watts 30   Minutes 15   Arm Ergometer   Level 1   Watts 8   Minutes 10   Arm/Foot Ergometer   Level 4   Watts 15   Minutes 10   Cybex   Level 1   RPM 50   Minutes 10   Recumbant Elliptical   Level 1   RPM 40   Watts 10   Minutes 10   Elliptical   Level 1   Speed 3   Minutes 1   REL-XR   Level 2   Watts 30   Minutes 15   Prescription Details   Frequency (times per week) 3   Duration Progress to 30 minutes of continuous aerobic without signs/symptoms of physical distress   Intensity   THRR REST +  30   Ratings of Perceived Exertion 11-15   Progression   Progression Continue progressive overload as per policy without signs/symptoms or physical distress.   Resistance Training   Training Prescription Yes   Weight 2   Reps 10-15      Perform Capillary Blood Glucose checks as needed.  Exercise Prescription Changes:     Exercise Prescription Changes      03/10/15 1600 03/17/15 1800 03/22/15 1200 04/01/15 1442 05/18/15 0800   Exercise Review   Progression   Yes No  Only 2 visits recorded this month No  Absent since 04/01/15   Response to Exercise   Blood Pressure (Admit)   118/78 mmHg 122/78 mmHg    Blood Pressure (Exercise)   158/80 mmHg 142/80 mmHg    Blood Pressure (Exit)   132/84 mmHg 124/70 mmHg    Heart Rate (Admit)   76 bpm 102 bpm    Heart Rate (Exercise)   98 bpm 119 bpm    Heart Rate (Exit)   81 bpm 97 bpm    Rating of Perceived Exertion (Exercise)   13 13    Symptoms   None Feeling weak and dizzy    Comments   Reviewed individualized exercise prescription and made increases per  departmental policy. Exercise increases were discussed with the patient and they were able to perform the new work loads without issue (no signs or symptoms).  Jacob Reyes has only had 2 visits this month and was feeling dizzy  during his last two visits so the current goal is to maintain current workloads.     Duration   Progress to 30 minutes of continuous aerobic without signs/symptoms of physical distress Progress to 30 minutes of continuous aerobic without signs/symptoms of physical distress Progress to 30 minutes of continuous aerobic without signs/symptoms of physical distress   Intensity   Rest + 30 Rest + 30 Rest + 30   Progression   Progression   Continue progressive overload as per policy without signs/symptoms or physical distress. Continue progressive overload as per policy without signs/symptoms or physical distress. Continue progressive overload as per policy without signs/symptoms or physical distress.   Resistance Training   Training Prescription (read-only)   Yes Yes Yes   Weight (read-only)   3 3 3    Reps (read-only)   10-15 10-15 10-15   Interval Training   Interval Training   No No No   NuStep   Level (read-only)    2  T5 NuStep 2  T5 NuStep   Watts (read-only)    20 20   Minutes (read-only)    20 20   Recumbant Elliptical   Level (read-only) 4 5 5 5   BioStep 5  BioStep   Watts (read-only) 25 30 30 30 30    Minutes (read-only) 30 30 30 30 30      06/07/15 1000 07/06/15 0600 08/03/15 1200       Exercise Review   Progression No  Absent since 04/01/15 No  Absent since 04/01/15 No  Absent since 04/01/15     Response to Exercise   Comments  Ex. Rx. will need to be re-evaluated upon return due to extended absence Ex. Rx. will need to be re-evaluated upon return due to extended absence     Duration Progress to 30 minutes of continuous aerobic without signs/symptoms of physical distress Progress to 30 minutes of continuous aerobic without signs/symptoms of physical distress Progress to 30 minutes of continuous aerobic without signs/symptoms of physical distress     Intensity Rest + 30 Rest + 30 Rest + 30     Progression   Progression Continue progressive overload as per policy without  signs/symptoms or physical distress. Continue progressive overload as per policy without signs/symptoms or physical distress. Continue progressive overload as per policy without signs/symptoms or physical distress.     Resistance Training   Training Prescription (read-only) Yes Yes      Weight (read-only) 3 3      Reps (read-only) 10-15 10-15      Interval Training   Interval Training No No No     NuStep   Level (read-only) 2  T5 NuStep 2  T5 NuStep      Watts (read-only) 20 20      Minutes (read-only) 20 20      Recumbant Elliptical   Level (read-only) 5  BioStep 5  BioStep      Watts (read-only) 30 30      Minutes (read-only) 30 30         Exercise Comments:   Discharge Exercise Prescription (Final Exercise Prescription Changes):     Exercise Prescription Changes - 08/03/15 1200    Exercise Review   Progression No  Absent since 04/01/15   Response to Exercise  Comments Ex. Rx. will need to be re-evaluated upon return due to extended absence   Duration Progress to 30 minutes of continuous aerobic without signs/symptoms of physical distress   Intensity Rest + 30   Progression   Progression Continue progressive overload as per policy without signs/symptoms or physical distress.   Interval Training   Interval Training No      Nutrition:  Target Goals: Understanding of nutrition guidelines, daily intake of sodium 1500mg , cholesterol 200mg , calories 30% from fat and 7% or less from saturated fats, daily to have 5 or more servings of fruits and vegetables.  Biometrics:     Pre Biometrics - 02/23/15 1437    Pre Biometrics   Height 6' 0.2" (1.834 m)   Weight 185 lb 12.8 oz (84.278 kg)   Waist Circumference 40.5 inches   Hip Circumference 44 inches   Waist to Hip Ratio 0.92 %   BMI (Calculated) 25.1       Nutrition Therapy Plan and Nutrition Goals:     Nutrition Therapy & Goals - 02/26/15 1124    Nutrition Therapy   Diet Patient declined MNT at this time.        Nutrition Discharge: Rate Your Plate Scores:     Nutrition Assessments - 02/23/15 1505    Rate Your Plate Scores   Pre Score --  Not done by patient age 37      Nutrition Goals Re-Evaluation:     Nutrition Goals Re-Evaluation      03/11/15 1726 03/29/15 1632 04/01/15 1647 04/14/15 1509 04/23/15 1347   Personal Goal #1 Re-Evaluation   Personal Goal #1 Jacob Reyes cont to eat well.  Is now willing to meet individually with the registered dietician.  "Jacob Reyes" Jacob Reyes now reports that Dr. Emily Filbert said his sodium level was low so he is suppose to eat foods high  in sodium. His wife reports that he is also on a strict fluid restriction that incl what he drinks taking his pills, any coffe he drinks etc.      Goal Progress Seen Yes Yes Yes Yes Yes   Comments    Still eats healthy Jacob Reyes said.  Jacob Reyes has been out recently having physical therapy.      05/20/15 1629 07/15/15 1527         Personal Goal #1 Re-Evaluation   Goal Progress Seen No Yes      Comments Jacob Reyes has been out for a long time. Jacob Reyes reports via phone he is much better eating etc even walking without a cane now. Working out at [employer] with his wife.          Psychosocial: Target Goals: Acknowledge presence or absence of depression, maximize coping skills, provide positive support system. Participant is able to verbalize types and ability to use techniques and skills needed for reducing stress and depression.  Initial Review & Psychosocial Screening:     Initial Psych Review & Screening - 02/23/15 1109    Initial Review   Current issues with Current Sleep Concerns   Family Dynamics   Good Support System? Yes   Comments Discussed maybe sleeping in recliner since his upper chest has some burning where he removed an artery to use. Dr. Jobie Quaker told them some people have that and sit in waiting room with their shirts open to not touch that site. (patient reports)      Quality of Life Scores:     Quality of Life -  02/23/15 1506    Quality  of Life Scores   Health/Function Pre --  Not done by patient. Jacob Reyes said he is 73 and really doesn't have any major problems at his age and his wife agreed.       PHQ-9:     Recent Review Flowsheet Data    Depression screen [facility] 2/9 02/23/2015   Decreased Interest 0   Down, Depressed, Hopeless 0   PHQ - 2 Score 0   Altered sleeping 3   Tired, decreased energy 0   Change in appetite 0   Feeling bad or failure about yourself  0   Trouble concentrating 0   Moving slowly or fidgety/restless 0   Suicidal thoughts 0   PHQ-9 Score 3      Psychosocial Evaluation and Intervention:   Psychosocial Re-Evaluation:     Psychosocial Re-Evaluation      03/11/15 1728 03/29/15 1635 04/01/15 1649 04/14/15 1510 04/23/15 1348   Psychosocial Re-Evaluation   Interventions Encouraged to attend Cardiac Rehabilitation for the exercise Encouraged to attend Cardiac Rehabilitation for the exercise Encouraged to attend Cardiac Rehabilitation for the exercise;Stress management education  Encouraged to attend Cardiac Rehabilitation for the exercise   Comments Jacob Reyes has a $40 copay each time he attends Cardiac Rehab. He did not say he wanted to leave yet and he mentioned the $40 copay.  Stressed due to passing out at home on 10/24 and in [place] seen later follow up by Dr. Sabra Heck.  Discussed with his wife self care for both of them since "we seem to have a doctors appt weekly and for months we have seen Dr. Emily Filbert weekly". Mrs. Dowty drives for her 18 year old husband now. Jacob Reyes said he has had to miss some Cardiac Rehab since he has had so many doctors appts. Jacob Reyes and his wife said it has been stressful to go to MD alot to figure out what is wrong with him.  Jacob Reyes has been out recently having physical therapy.    Continued Psychosocial Services Needed Yes  Yes       05/20/15 1629 07/15/15 1528         Psychosocial Re-Evaluation   Comments Jacob Reyes has been out a while.   Jacob Reyes reports via phone he is much better eating etc even walking without a cane now. Working out at [employer] with his wife.          Vocational Rehabilitation: Provide vocational rehab assistance to qualifying candidates.   Vocational Rehab Evaluation & Intervention:     Vocational Rehab - 02/23/15 1107    Initial Vocational Rehab Evaluation & Intervention   Assessment shows need for Vocational Rehabilitation No      Education: Education Goals: Education classes will be provided on a weekly basis, covering required topics. Participant will state understanding/return demonstration of topics presented.  Learning Barriers/Preferences:     Learning Barriers/Preferences - 02/23/15 1503    Learning Barriers/Preferences   Learning Barriers Hearing      Education Topics: General Nutrition Guidelines/Fats and Fiber: -Group instruction provided by verbal, written material, models and posters to present the general guidelines for heart healthy nutrition. Gives an explanation and review of dietary fats and fiber.          Cardiac Rehab from 03/01/2015 in [facility] Cardiac and Pulmonary Rehab   Date  03/01/15   Educator  PI   Instruction Review Code  2- meets goals/outcomes      Controlling Sodium/Reading Food Labels: -Group verbal and written material supporting the  discussion of sodium use in heart healthy nutrition. Review and explanation with models, verbal and written materials for utilization of the food label.   Exercise Physiology & Risk Factors: - Group verbal and written instruction with models to review the exercise physiology of the cardiovascular system and associated critical values. Details cardiovascular disease risk factors and the goals associated with each risk factor.   Aerobic Exercise & Resistance Training: - Gives group verbal and written discussion on the health impact of inactivity. On the components of aerobic and resistive training programs and the benefits of  this training and how to safely progress through these programs.   Flexibility, Balance, General Exercise Guidelines: - Provides group verbal and written instruction on the benefits of flexibility and balance training programs. Provides general exercise guidelines with specific guidelines to those with heart or lung disease. Demonstration and skill practice provided.   Stress Management: - Provides group verbal and written instruction about the health risks of elevated stress, cause of high stress, and healthy ways to reduce stress.   Depression: - Provides group verbal and written instruction on the correlation between heart/lung disease and depressed mood, treatment options, and the stigmas associated with seeking treatment.   Anatomy & Physiology of the Heart: - Group verbal and written instruction and models provide basic cardiac anatomy and physiology, with the coronary electrical and arterial systems. Review of: AMI, Angina, Valve disease, Heart Failure, Cardiac Arrhythmia, Pacemakers, and the ICD.   Cardiac Procedures: - Group verbal and written instruction and models to describe the testing methods done to diagnose heart disease. Reviews the outcomes of the test results. Describes the treatment choices: Medical Management, Angioplasty, or Coronary Bypass Surgery.   Cardiac Medications: - Group verbal and written instruction to review commonly prescribed medications for heart disease. Reviews the medication, class of the drug, and side effects. Includes the steps to properly store meds and maintain the prescription regimen.   Go Sex-Intimacy & Heart Disease, Get SMART - Goal Setting: - Group verbal and written instruction through game format to discuss heart disease and the return to sexual intimacy. Provides group verbal and written material to discuss and apply goal setting through the application of the S.M.A.R.T. Method.   Other Matters of the Heart: - Provides group  verbal, written materials and models to describe Heart Failure, Angina, Valve Disease, and Diabetes in the realm of heart disease. Includes description of the disease process and treatment options available to the cardiac patient.   Exercise & Equipment Safety: - Individual verbal instruction and demonstration of equipment use and safety with use of the equipment.      Cardiac Rehab from 03/01/2015 in [facility] Cardiac and Pulmonary Rehab   Date  02/23/15   Educator  C. Enterkin,RN   Instruction Review Code  1- partially meets, needs review/practice      Infection Prevention: - Provides verbal and written material to individual with discussion of infection control including proper hand washing and proper equipment cleaning during exercise session.      Cardiac Rehab from 03/01/2015 in [facility] Cardiac and Pulmonary Rehab   Date  02/23/15   Educator  C. Enterkin,RN   Instruction Review Code  2- meets goals/outcomes      Falls Prevention: - Provides verbal and written material to individual with discussion of falls prevention and safety.      Cardiac Rehab from 03/01/2015 in [facility] Cardiac and Pulmonary Rehab   Date  02/23/15   Educator  C. Enterkin,RN   Instruction  Review Code  2- meets goals/outcomes      Diabetes: - Individual verbal and written instruction to review signs/symptoms of diabetes, desired ranges of glucose level fasting, after meals and with exercise. Advice that pre and post exercise glucose checks will be done for 3 sessions at entry of program.    Knowledge Questionnaire Score:     Knowledge Questionnaire Score - 02/23/15 1503    Knowledge Questionnaire Score   Pre Score Not done by patient      Personal Goals and Risk Factors at Admission:     Personal Goals and Risk Factors at Admission - 02/24/15 1410    Core Components/Risk Factors/Patient Goals on Admission    Weight Management No   Intervention (read-only) Learn and follow the exercise and diet  guidelines while in the program. Utilize the nutrition and education classes to help gain knowledge of the diet and exercise expectations in the program   Admit Weight 178 lb (80.74 kg)  Jacob Reyes reported Dr. Sabra Heck stopped his fluid pills since his weight was down 12 lbs and sodium was too low.    Goal Weight: Short Term 190 lb (86.183 kg)      Personal Goals and Risk Factors Review:      Goals and Risk Factor Review      03/03/15 1837 03/11/15 1727 03/18/15 1705 03/29/15 1635 03/29/15 1758   Core Components/Risk Factors/Patient Goals Review   Personal Goals Review Increase Aerobic Exercise and Physical Activity Increase Aerobic Exercise and Physical Activity      Increase Aerobic Exercise and Physical Activity (read-only)   Goals Progress/Improvement seen  Yes Yes Yes  Yes   Comments  Jacob Reyes feels he isn't getting strength like he would like to but Jacob Reyes said he did not like taking the Paxil that the doctor suggested. Jacob Reyes reports that he has another MD appt with Dr. Emily Filbert next week.  Jacob Reyes says his wife wants him to get more strength but he did alot on REL today and got tired. Is still dizzy at times so kept him off treadmill.  Stressed due to passing out at home on 10/24 and in [place] seen later follow up by Dr. Sabra Heck.  Did 30 minutes of exercise but Jacob Reyes mentioned he was worried about passing out so I took him to his car in a wheelchair for his wife to drive him home. During exercising sitting on the Nustep his blood pressure dropped to 90/60 but came up to 120/70 with 240 cc of water he drank for me.      04/01/15 1637 04/01/15 1648 04/14/15 1510 04/23/15 1347 05/20/15 1629   Core Components/Risk Factors/Patient Goals Review   Personal Goals Review Increase Aerobic Exercise and Physical Activity       Increase Aerobic Exercise and Physical Activity (read-only)   Goals Progress/Improvement seen  Yes Yes No Yes No   Comments Jacob Reyes reports that Dr. Emily Filbert is having him eat more  sodium since his sodium levels are low.  Although Jacob Reyes did not stay today to do weights, he did 30+ minutes exercising sitting on the Nustep.Jacob Reyes called back and said Dr.Mark Sabra Heck suggested Physical therapy for him so he won't be in Cardiac Rehab for a while.  Jacob Reyes has been out recently having physical therapy.  Jacob Reyes has been out for a while from Cardiac Rehab.      06/07/15 1021 07/15/15 1528         Core Components/Risk Factors/Patient Goals Review  Personal Goals Review  Sedentary      Review  Benet reports via phone he is much better eating etc even walking without a cane now. Working out at [employer] with his wife.       Expected Outcomes  Jacob Reyes reports via phone he is much better eating etc even walking without a cane now. Working out at [employer] with his wife.       Increase Aerobic Exercise and Physical Activity (read-only)   Goals Progress/Improvement seen  No       Comments Jack[facility] Ex. Rx. will be re-evaluated upon his return due to the extended absence          Personal Goals Discharge (Final Personal Goals and Risk Factors Review):      Goals and Risk Factor Review - 07/15/15 1528    Core Components/Risk Factors/Patient Goals Review   Personal Goals Review Sedentary   Review Xsavier reports via phone he is much better eating etc even walking without a cane now. Working out at [employer] with his wife.    Expected Outcomes Sherril reports via phone he is much better eating etc even walking without a cane now. Working out at [employer] with his wife.       ITP Comments:     ITP Comments      05/23/15 1346 06/17/15 1332 07/13/15 0704 08/12/15 1513 08/12/15 1514   ITP Comments Ready for 30 day review.  Continue with ITP Ready for 30 day review. Continue with ITP.  Has been absent since 04/01/2015 calls have been made to check on return to program.  No response yet. Ready for 30 day review. Continue with ITP.  Has been absent since 04/01/2015 calls have been made to check on return to  program.  No response yet. discDischargedharged discharged      Comments:

## 2015-09-09 ENCOUNTER — Ambulatory Visit (INDEPENDENT_AMBULATORY_CARE_PROVIDER_SITE_OTHER): Payer: PPO | Admitting: Podiatry

## 2015-09-09 DIAGNOSIS — B351 Tinea unguium: Secondary | ICD-10-CM

## 2015-09-09 DIAGNOSIS — M79673 Pain in unspecified foot: Secondary | ICD-10-CM | POA: Diagnosis not present

## 2015-09-09 DIAGNOSIS — L84 Corns and callosities: Secondary | ICD-10-CM

## 2015-09-09 DIAGNOSIS — Q828 Other specified congenital malformations of skin: Secondary | ICD-10-CM

## 2015-09-09 NOTE — Progress Notes (Signed)
Patient ID: Jacob Reyes, male   DOB: [birth year], 82 y.o.   MRN: [id number]  Subjective: 82 y.o.-year-old male returns the office today for continued care calluses to the bottoms of his feet most on the right side worse than left submetatarsal 2. He is also is getting calluses on the tops of his toes due to hammertoes, mostly on the right 3rd toe. Also inquiring about shoes. No other complaints at this time in no acute changes.  Objective: AAO 3, NAD DP/PT pulses palpable, CRT less than 3 seconds Protective sensation intact with Derrel Nip monofilament Hyperkeratotic lesion right foot submetatarsal 2. Upon debridement there is no underlying ulceration, drainage or other signs of infection at this time.  There is prominent metatarsal heads with atrophy of the fat pad. Particularly the right second metatarsal head is very prominent underlying area of callus. There is also small pre-ulcerative calluses on the dorsal aspect of the PIPJ to the lesser digits on the left foot as well as the right third toe. There is also a frontal plane deformity to the toes on the right side due to the amputation. There has been a previous second toe amputation. No other open lesions or pre-ulcer lesions. Nails are hypertrophic, dystrophic, brittle, discolored, elongated 9. His tenderness in nails 1-5 bilaterally except for the right second toe is been previously be today. No surrounding erythema or drainage. No pain with calf compression, warmth, erythema.  Assessment: Patient presents with pre-ulcerative callus right foot and symptomatic onychomycosis  Plan: -Treatment options including alternatives, risks, complications were discussed -Hyperkeratotic lesions sharply debrided without complications or bleeding and the underlying ulceration appears to be healed. Offloading pads were dispensed. Continue to inspect the area daily.  -Offloading pads for the hammertoes. -Awaiting to hear from insurance in regards  to shoes/inserts. I did go ahead and mold his feet today for possible inserts. Will follow up with insurance. -Nails debrided x 9 without complications or bleeding. -Follow-up in 6 weeks or sooner if any problems arise. In the meantime, encouraged to call the office with any questions, concerns, change in symptoms.   [name], DPM

## 2015-09-13 DIAGNOSIS — E538 Deficiency of other specified B group vitamins: Secondary | ICD-10-CM | POA: Diagnosis not present

## 2015-09-13 DIAGNOSIS — R5382 Chronic fatigue, unspecified: Secondary | ICD-10-CM | POA: Diagnosis not present

## 2015-09-13 DIAGNOSIS — D510 Vitamin B12 deficiency anemia due to intrinsic factor deficiency: Secondary | ICD-10-CM | POA: Diagnosis not present

## 2015-09-13 DIAGNOSIS — G4731 Primary central sleep apnea: Secondary | ICD-10-CM | POA: Diagnosis not present

## 2015-09-13 DIAGNOSIS — I10 Essential (primary) hypertension: Secondary | ICD-10-CM | POA: Diagnosis not present

## 2015-09-15 ENCOUNTER — Emergency Department
Admission: EM | Admit: 2015-09-15 | Discharge: 2015-09-15 | Disposition: A | Discharge status: Home / Self Care | Payer: PPO | Attending: Emergency Medicine | Admitting: Emergency Medicine

## 2015-09-15 ENCOUNTER — Emergency Department: Payer: PPO

## 2015-09-15 DIAGNOSIS — I4891 Unspecified atrial fibrillation: Secondary | ICD-10-CM | POA: Insufficient documentation

## 2015-09-15 DIAGNOSIS — I952 Hypotension due to drugs: Secondary | ICD-10-CM | POA: Diagnosis not present

## 2015-09-15 DIAGNOSIS — I2510 Atherosclerotic heart disease of native coronary artery without angina pectoris: Secondary | ICD-10-CM | POA: Insufficient documentation

## 2015-09-15 DIAGNOSIS — R42 Dizziness and giddiness: Secondary | ICD-10-CM | POA: Diagnosis not present

## 2015-09-15 DIAGNOSIS — F329 Major depressive disorder, single episode, unspecified: Secondary | ICD-10-CM | POA: Insufficient documentation

## 2015-09-15 DIAGNOSIS — Z85038 Personal history of other malignant neoplasm of large intestine: Secondary | ICD-10-CM | POA: Insufficient documentation

## 2015-09-15 DIAGNOSIS — Z79899 Other long term (current) drug therapy: Secondary | ICD-10-CM | POA: Diagnosis not present

## 2015-09-15 DIAGNOSIS — E785 Hyperlipidemia, unspecified: Secondary | ICD-10-CM | POA: Insufficient documentation

## 2015-09-15 DIAGNOSIS — J90 Pleural effusion, not elsewhere classified: Secondary | ICD-10-CM | POA: Diagnosis not present

## 2015-09-15 DIAGNOSIS — Z8546 Personal history of malignant neoplasm of prostate: Secondary | ICD-10-CM | POA: Insufficient documentation

## 2015-09-15 DIAGNOSIS — T447X5A Adverse effect of beta-adrenoreceptor antagonists, initial encounter: Secondary | ICD-10-CM | POA: Insufficient documentation

## 2015-09-15 DIAGNOSIS — R531 Weakness: Secondary | ICD-10-CM | POA: Diagnosis not present

## 2015-09-15 DIAGNOSIS — Z955 Presence of coronary angioplasty implant and graft: Secondary | ICD-10-CM | POA: Diagnosis not present

## 2015-09-15 DIAGNOSIS — Z7982 Long term (current) use of aspirin: Secondary | ICD-10-CM | POA: Insufficient documentation

## 2015-09-15 LAB — CBC WITH DIFFERENTIAL/PLATELET
Basophils Absolute: 0 10*3/uL (ref 0–0.1)
Basophils Relative: 1 %
EOS PCT: 1 %
Eosinophils Absolute: 0.1 10*3/uL (ref 0–0.7)
HCT: 42.2 % (ref 40.0–52.0)
HEMOGLOBIN: 14.6 g/dL (ref 13.0–18.0)
LYMPHS ABS: 1.6 10*3/uL (ref 1.0–3.6)
LYMPHS PCT: 22 %
MCH: 32 pg (ref 26.0–34.0)
MCHC: 34.6 g/dL (ref 32.0–36.0)
MCV: 92.7 fL (ref 80.0–100.0)
Monocytes Absolute: 0.9 10*3/uL (ref 0.2–1.0)
Monocytes Relative: 12 %
Neutro Abs: 4.7 10*3/uL (ref 1.4–6.5)
Neutrophils Relative %: 64 %
PLATELETS: 171 10*3/uL (ref 150–440)
RBC: 4.55 MIL/uL (ref 4.40–5.90)
RDW: 14 % (ref 11.5–14.5)
WBC: 7.3 10*3/uL (ref 3.8–10.6)

## 2015-09-15 LAB — COMPREHENSIVE METABOLIC PANEL
ALBUMIN: 3.4 g/dL — AB (ref 3.5–5.0)
ALT: 12 U/L — AB (ref 17–63)
AST: 20 U/L (ref 15–41)
Alkaline Phosphatase: 66 U/L (ref 38–126)
Anion gap: 7 (ref 5–15)
BUN: 17 mg/dL (ref 6–20)
CHLORIDE: 102 mmol/L (ref 101–111)
CO2: 24 mmol/L (ref 22–32)
CREATININE: 0.97 mg/dL (ref 0.61–1.24)
Calcium: 8.2 mg/dL — ABNORMAL LOW (ref 8.9–10.3)
GFR calc non Af Amer: >60 mL/min (ref >60)
Glucose, Bld: 104 mg/dL — ABNORMAL HIGH (ref 65–99)
Potassium: 4.5 mmol/L (ref 3.5–5.1)
SODIUM: 133 mmol/L — AB (ref 135–145)
Total Bilirubin: 1.1 mg/dL (ref 0.3–1.2)
Total Protein: 5.6 g/dL — ABNORMAL LOW (ref 6.5–8.1)

## 2015-09-15 LAB — TROPONIN I
Troponin I: <0.03 ng/mL (ref <0.031)
Troponin I: <0.03 ng/mL (ref <0.031)

## 2015-09-15 MED ORDER — SODIUM CHLORIDE 0.9 % IV SOLN
Freq: Once | INTRAVENOUS | Status: AC
  Administered 2015-09-15: 12:00:00 via INTRAVENOUS

## 2015-09-15 NOTE — Discharge Instructions (Signed)
Stop the Coreg. Dr. Sabra Heck should call you tomorrow with an appointment. If not you can call him for follow-up appointment I would like you to get her blood pressure checked again tomorrow. Please return for any further problems.

## 2015-09-15 NOTE — ED Provider Notes (Signed)
[facility] Emergency Department Provider Note  ____________________________________________  Time seen: Approximately 11:58 AM  I have reviewed the triage vital signs and the nursing notes.   HISTORY  Chief Complaint Weakness    HPI Jacob Reyes is a 82 y.o. male patient reports he started on a new medicine for blood pressure on Monday. he took it for the first time on Tuesday. He seems to been somewhat weak and dizzy afterwards. And definitely this morning after he took the medicine he became weak and dizzy blood pressure was down. He does not complain of any chest tightness or pain shortness of breath increased leg swelling or any other complaints. His symptoms are worse if he sits up or stands   Past Medical History  Diagnosis Date  . Glaucoma   . Hearing loss   . Reflux   . GERD (gastroesophageal reflux disease)   . Cancer [facility])     prostate  . Colon cancer ([place])   . Coronary artery disease   . Hypertension   . Abnormal stress test   . Depression   . Anxiety     just started med. for anxiety   . History of hiatal hernia     Patient Active Problem List   Diagnosis Date Noted  . Anticoagulated-Eliquis added post CABG 01/06/2015  . Atrial fibrillation with RVR post op 01/02/2015  . Dyslipidemia 01/02/2015  . Hypotension post op 01/02/2015  . GERD (gastroesophageal reflux disease) 01/02/2015  . S/P CABG x 6 12/28/2014  . Coronary artery disease 12/10/2014  . Angina, class III ([place]) 12/10/2014  . Combined fat and carbohydrate induced hyperlipemia 05/25/2014  . Cancer of colon- remote surgery 11/16/2013    Past Surgical History  Procedure Laterality Date  . Colon surgery    . Cardiac catheterization N/A 12/10/2014    Procedure: Left Heart Cath and Coronary Angiography;  Surgeon: Teodoro Spray, MD;  Location: [place] CV LAB;  Service: Cardiovascular;  Laterality: N/A;  . Foot surgery Right 2009    Shorten metatarsal  . Hernia  repair Bilateral 1991    inguinal   . Tonsillectomy    . Eye surgery      catarcats removed bilataeral /w IOL  . Coronary artery bypass graft N/A 12/28/2014    Procedure: CORONARY ARTERY BYPASS GRAFTING (CABG), ON PUMP, TIMES SIX, USING LEFT INTERNAL MAMMARY ARTERY, RIGHT GREATER SAPHENOUS VEIN HARVESTED ENDOSCOPICALLY;  Surgeon: [name], MD;  Location: [place];  Service: Open Heart Surgery;  Laterality: N/A;  -SEQ LIMA to MID LAD and DISTAL LAD -SVG to DIAGONAL -SEQ SVG to OM1 and OM2 -SVG to PDA  . Tee without cardioversion N/A 12/28/2014    Procedure: TRANSESOPHAGEAL ECHOCARDIOGRAM (TEE);  Surgeon: [name], MD;  Location: [place];  Service: Open Heart Surgery;  Laterality: N/A;    Current Outpatient Rx  Name  Route  Sig  Dispense  Refill  . aspirin EC 81 MG tablet   Oral   Take 81 mg by mouth daily.         . brimonidine-timolol (COMBIGAN) 0.2-0.5 % ophthalmic solution   Both Eyes   Place 1 drop into both eyes 2 (two) times daily.         . cholecalciferol (VITAMIN D) 1000 UNITS tablet   Oral   Take 1,000 Units by mouth daily.         . finasteride (PROSCAR) 5 MG tablet   Oral   Take 5 mg by  mouth daily after supper.          . levothyroxine (SYNTHROID, LEVOTHROID) 75 MCG tablet   Oral   Take 75 mcg by mouth daily before breakfast.          . LORazepam (ATIVAN) 0.5 MG tablet   Oral   Take 0.5 mg by mouth 2 (two) times daily.         Marland Kitchen lovastatin (MEVACOR) 40 MG tablet   Oral   Take 40 mg by mouth daily after supper.          Marland Kitchen LUMIGAN 0.01 % SOLN   Both Eyes   Place 1 drop into both eyes at bedtime.          . magnesium chloride (SLOW-MAG) 64 MG TBEC SR tablet   Oral   Take 2 tablets (128 mg total) by mouth daily.   60 tablet      . omeprazole (PRILOSEC) 20 MG capsule   Oral   Take 20 mg by mouth daily before breakfast.          . polyethylene glycol powder (GLYCOLAX/MIRALAX) powder   Oral   Take 17 g by mouth daily.           . tamsulosin (FLOMAX) 0.4 MG CAPS capsule   Oral   Take 0.4 mg by mouth 2 (two) times daily.            Allergies Review of patient's allergies indicates no known allergies.  No family history on file.  Social History Social History  Substance Use Topics  . Smoking status: Never Smoker   . Smokeless tobacco: Never Used  . Alcohol Use: No    Review of Systems Constitutional: No fever/chills Eyes: No visual changes. ENT: No sore throat. Cardiovascular: Denies chest pain. Respiratory: Denies shortness of breath. Gastrointestinal: No abdominal pain.  No nausea, no vomiting.  No diarrhea.  No constipation. Genitourinary: Negative for dysuria. Musculoskeletal: Negative for back pain. Skin: Negative for rash. Neurological: Negative for headaches, focal weakness or numbness.  10-point ROS otherwise negative.  ____________________________________________   PHYSICAL EXAM:  VITAL SIGNS: ED Triage Vitals  Enc Vitals Group     BP 09/15/15 1139 94/67 mmHg     Pulse Rate 09/15/15 1135 60     Resp 09/15/15 1135 16     Temp 09/15/15 1139 97.6 F (36.4 C)     Temp Source 09/15/15 1139 Oral     SpO2 09/15/15 1135 99 %     Weight --      Height --      Head Cir --      Peak Flow --      Pain Score 09/15/15 1136 0     Pain Loc --      Pain Edu? --      Excl. in [place]? --     Constitutional: Alert and oriented. Well appearing and in no acute distress. Eyes: Conjunctivae are normal. PERRL. EOMI. Head: Atraumatic. Nose: No congestion/rhinnorhea. Mouth/Throat: Mucous membranes are moist.  Oropharynx non-erythematous. Neck: No stridor.   Cardiovascular: Normal rate, regular rhythm. Grossly normal heart sounds.  Good peripheral circulation. Respiratory: Normal respiratory effort.  No retractions. Lungs CTAB. Gastrointestinal: Soft and nontender. No distention. No abdominal bruits. No CVA tenderness. Musculoskeletal: No lower extremity tenderness nor edema.  No joint  effusions. Neurologic:  Normal speech and language. No gross focal neurologic deficits are appreciated. No gait instability. Skin:  Skin is warm, dry and intact. No rash noted.  Psychiatric: Mood and affect are normal. Speech and behavior are normal.  ____________________________________________   LABS (all labs ordered are listed, but only abnormal results are displayed)  Labs Reviewed  COMPREHENSIVE METABOLIC PANEL - Abnormal; Notable for the following:    Sodium 133 (*)    Glucose, Bld 104 (*)    Calcium 8.2 (*)    Total Protein 5.6 (*)    Albumin 3.4 (*)    ALT 12 (*)    All other components within normal limits  TROPONIN I  CBC WITH DIFFERENTIAL/PLATELET  TROPONIN I   ____________________________________________  EKG  EKG read and interpreted by me shows sinus bradycardia rate of 56 normal axis and occasional sinus arrhythmia there is some PR segment depression and lead to only ____________________________________________  RADIOLOGY  IMPRESSION: Moderate left pleural effusion with left lower lobe atelectasis or infiltrate, similar to prior study.   Electronically Signed  By: Rolm Baptise M.D.  On: 09/15/2015 12:32 ____________________________________________   PROCEDURES    ____________________________________________   INITIAL IMPRESSION / ASSESSMENT AND PLAN / ED COURSE  Pertinent labs & imaging results that were available during my care of the patient were reviewed by me  his carvedilol again this was discussed with Dr. Beverely Low considered in my medical decision making (see chart for details).  Discussed with patient with Dr. Emily Filbert he will follow-up in the office. He is much better after fluids his blood pressures up we will stop ____________________________________________   FINAL CLINICAL IMPRESSION(S) / ED DIAGNOSES  Final diagnoses:  Hypotension due to drugs      Nena Polio, MD 09/15/15 1515

## 2015-09-15 NOTE — ED Notes (Addendum)
Pt bib EMS w/ c/o weakness and dizziness.  Per EMS, pts medications changed Monday, new medication (carvadelol added 12.5).  Pt sts he does not know if he passed out "I was in and out of it".  Pt A/Ox4, 99% RA. Pt has no obvious trauma to head or body.  CBG 103.  Given 400 IVF NS by EMS

## 2015-09-17 DIAGNOSIS — E538 Deficiency of other specified B group vitamins: Secondary | ICD-10-CM | POA: Diagnosis not present

## 2015-09-17 DIAGNOSIS — I952 Hypotension due to drugs: Secondary | ICD-10-CM | POA: Diagnosis not present

## 2015-09-17 DIAGNOSIS — D510 Vitamin B12 deficiency anemia due to intrinsic factor deficiency: Secondary | ICD-10-CM | POA: Diagnosis not present

## 2015-09-21 DIAGNOSIS — H401131 Primary open-angle glaucoma, bilateral, mild stage: Secondary | ICD-10-CM | POA: Diagnosis not present

## 2015-09-23 ENCOUNTER — Telehealth: Payer: Self-pay | Admitting: Podiatry

## 2015-09-23 NOTE — Telephone Encounter (Signed)
Wife called, said that they went to [employer] today and wanted to speak with Dr Willia Craze nurse about some impressions that they made of his feet while they were there. Please call Letta Median at home when you get a chance to discuss. Terie Purser

## 2015-10-01 DIAGNOSIS — E538 Deficiency of other specified B group vitamins: Secondary | ICD-10-CM | POA: Diagnosis not present

## 2015-10-14 DIAGNOSIS — E538 Deficiency of other specified B group vitamins: Secondary | ICD-10-CM | POA: Diagnosis not present

## 2015-10-19 ENCOUNTER — Ambulatory Visit: Payer: PPO | Attending: Specialist

## 2015-10-19 DIAGNOSIS — G4761 Periodic limb movement disorder: Secondary | ICD-10-CM | POA: Diagnosis not present

## 2015-10-19 DIAGNOSIS — I10 Essential (primary) hypertension: Secondary | ICD-10-CM | POA: Diagnosis not present

## 2015-10-19 DIAGNOSIS — I482 Chronic atrial fibrillation: Secondary | ICD-10-CM | POA: Diagnosis not present

## 2015-10-19 DIAGNOSIS — G4733 Obstructive sleep apnea (adult) (pediatric): Secondary | ICD-10-CM | POA: Diagnosis not present

## 2015-10-19 DIAGNOSIS — R0683 Snoring: Secondary | ICD-10-CM | POA: Diagnosis not present

## 2015-10-19 DIAGNOSIS — G4700 Insomnia, unspecified: Secondary | ICD-10-CM | POA: Diagnosis not present

## 2015-10-19 DIAGNOSIS — R5382 Chronic fatigue, unspecified: Secondary | ICD-10-CM | POA: Diagnosis not present

## 2015-10-19 DIAGNOSIS — D510 Vitamin B12 deficiency anemia due to intrinsic factor deficiency: Secondary | ICD-10-CM | POA: Diagnosis not present

## 2015-10-21 ENCOUNTER — Ambulatory Visit (INDEPENDENT_AMBULATORY_CARE_PROVIDER_SITE_OTHER): Payer: PPO | Admitting: Podiatry

## 2015-10-21 ENCOUNTER — Encounter: Payer: Self-pay | Admitting: Podiatry

## 2015-10-21 DIAGNOSIS — L84 Corns and callosities: Secondary | ICD-10-CM

## 2015-10-21 DIAGNOSIS — Q828 Other specified congenital malformations of skin: Secondary | ICD-10-CM

## 2015-10-21 DIAGNOSIS — B351 Tinea unguium: Secondary | ICD-10-CM | POA: Diagnosis not present

## 2015-10-21 DIAGNOSIS — M79674 Pain in right toe(s): Secondary | ICD-10-CM

## 2015-10-21 DIAGNOSIS — M79675 Pain in left toe(s): Secondary | ICD-10-CM

## 2015-10-27 NOTE — Progress Notes (Signed)
Patient ID: Jacob Reyes, male   DOB: Nov 05, 1925, 82 y.o.   MRN: [id number]  Subjective: 82 y.o.-year-old male returns the office today for painful, elongated, thickened toenails which he is unable to cut himself. Denies any redness or drainage around the nails. Also states he has painful calluses on the bottom of his feet which are painful with pressure. He has received new inserts and shoes since last appointment which have helped. Denies any acute changes since last appointment and no new complaints today. Denies any systemic complaints such as fevers, chills, nausea, vomiting.   Objective: AAO 3, NAD DP/PT pulses palpable, CRT less than 3 seconds Chronic appearing edema to bilateral lower extremities.  Protective sensation intact with Derrel Nip monofilament, Achilles tendon reflex intact.  Nails hypertrophic, dystrophic, elongated, brittle, discolored 9. There is tenderness overlying these nails 1-5 bilaterally with the exception of the right 2nd toe as it has previously been amputated. There is no surrounding erythema or drainage along the nail sites. Hyperkerotic lesions bilateral submetatarsal 2 with right > left. Upon debridement, no underlying ulceration, drainage, or clinical signs of infection.  No open lesions or other pre-ulcerative lesions are identified. No other areas of tenderness bilateral lower extremities. Significant HAV deformity bilaterally with hammertoe contracture. No overlying, erythema, increased warmth. No pain with calf compression, warmth, erythema.  Assessment: Patient presents with symptomatic onychomycosis x9, porokeratosis x 2  Plan: -Treatment options including alternatives, risks, complications were discussed -Nails sharply debrided 9 without complication/bleeding. -Hyperkerotic lesion sharply debrided x 2 without complications.  -Discussed daily foot inspection. If there are any changes, to call the office immediately.  -Follow-up in 3 months  or sooner if any problems are to arise. In the meantime, encouraged to call the office with any questions, concerns, changes symptoms.  [name], DPM

## 2015-10-28 DIAGNOSIS — D538 Other specified nutritional anemias: Secondary | ICD-10-CM | POA: Diagnosis not present

## 2015-11-11 DIAGNOSIS — I482 Chronic atrial fibrillation: Secondary | ICD-10-CM | POA: Diagnosis not present

## 2015-11-11 DIAGNOSIS — R5383 Other fatigue: Secondary | ICD-10-CM | POA: Diagnosis not present

## 2015-11-11 DIAGNOSIS — F5104 Psychophysiologic insomnia: Secondary | ICD-10-CM | POA: Diagnosis not present

## 2015-11-11 DIAGNOSIS — D510 Vitamin B12 deficiency anemia due to intrinsic factor deficiency: Secondary | ICD-10-CM | POA: Diagnosis not present

## 2015-12-02 DIAGNOSIS — F5104 Psychophysiologic insomnia: Secondary | ICD-10-CM | POA: Diagnosis not present

## 2015-12-02 DIAGNOSIS — I950 Idiopathic hypotension: Secondary | ICD-10-CM | POA: Diagnosis not present

## 2015-12-02 DIAGNOSIS — R5383 Other fatigue: Secondary | ICD-10-CM | POA: Diagnosis not present

## 2015-12-23 ENCOUNTER — Ambulatory Visit (INDEPENDENT_AMBULATORY_CARE_PROVIDER_SITE_OTHER): Payer: PPO | Admitting: Podiatry

## 2015-12-23 ENCOUNTER — Encounter: Payer: Self-pay | Admitting: Podiatry

## 2015-12-23 DIAGNOSIS — M79675 Pain in left toe(s): Secondary | ICD-10-CM

## 2015-12-23 DIAGNOSIS — B351 Tinea unguium: Secondary | ICD-10-CM

## 2015-12-23 DIAGNOSIS — Q828 Other specified congenital malformations of skin: Secondary | ICD-10-CM | POA: Diagnosis not present

## 2015-12-23 DIAGNOSIS — M79674 Pain in right toe(s): Secondary | ICD-10-CM

## 2015-12-23 NOTE — Progress Notes (Signed)
Patient ID: Jacob Reyes, male   DOB: 11-09-1925, 82 y.o.   MRN: [id number]  Subjective: 82 y.o.-year-old male returns the office today for painful, elongated, thickened toenails which he is unable to cut himself. Denies any redness or drainage around the nails. Also states he has painful calluses on the bottom of his feet which are painful with pressure. He has changed his shoes around some and this has been helping and he states "this is the best they have looked".  Denies any acute changes since last appointment and no new complaints today. Denies any systemic complaints such as fevers, chills, nausea, vomiting.   Objective: AAO 3, NAD DP/PT pulses palpable, CRT less than 3 seconds Chronic appearing edema to bilateral lower extremities.  Protective sensation intact with Derrel Nip monofilament, Achilles tendon reflex intact.  Nails hypertrophic, dystrophic, elongated, brittle, discolored 9. There is tenderness overlying these nails 1-5 bilaterally with the exception of the right 2nd toe as it has previously been amputated. There is no surrounding erythema or drainage along the nail sites. Hyperkerotic lesions bilateral submetatarsal 2 with right > left. Upon debridement, no underlying ulceration, drainage, or clinical signs of infection.  No open lesions or other pre-ulcerative lesions are identified. No other areas of tenderness bilateral lower extremities. Significant HAV deformity bilaterally with hammertoe contracture. No overlying, erythema, increased warmth. No pain with calf compression, warmth, erythema.  Assessment: Patient presents with symptomatic onychomycosis x9, porokeratosis x 2  Plan: -Treatment options including alternatives, risks, complications were discussed -Nails sharply debrided 9 without complication/bleeding. -Hyperkerotic lesion sharply debrided x 2 without complications.  -Discussed daily foot inspection. If there are any changes, to call the office  immediately.  -Follow-up in 3 months or sooner if any problems are to arise. In the meantime, encouraged to call the office with any questions, concerns, changes symptoms.  [name], DPM

## 2015-12-30 ENCOUNTER — Other Ambulatory Visit: Payer: Self-pay | Admitting: Internal Medicine

## 2015-12-30 DIAGNOSIS — G40909 Epilepsy, unspecified, not intractable, without status epilepticus: Secondary | ICD-10-CM | POA: Diagnosis not present

## 2015-12-30 DIAGNOSIS — R55 Syncope and collapse: Secondary | ICD-10-CM | POA: Diagnosis not present

## 2015-12-30 DIAGNOSIS — R627 Adult failure to thrive: Secondary | ICD-10-CM | POA: Diagnosis not present

## 2015-12-30 DIAGNOSIS — G9349 Other encephalopathy: Secondary | ICD-10-CM | POA: Diagnosis not present

## 2016-01-07 DIAGNOSIS — R55 Syncope and collapse: Secondary | ICD-10-CM | POA: Diagnosis not present

## 2016-01-10 ENCOUNTER — Ambulatory Visit: Payer: PPO

## 2016-01-12 ENCOUNTER — Ambulatory Visit
Admission: RE | Admit: 2016-01-12 | Discharge: 2016-01-12 | Disposition: A | Discharge status: Home / Self Care | Payer: PPO | Source: Ambulatory Visit | Attending: Internal Medicine | Admitting: Internal Medicine

## 2016-01-12 DIAGNOSIS — G40909 Epilepsy, unspecified, not intractable, without status epilepticus: Secondary | ICD-10-CM

## 2016-01-12 DIAGNOSIS — E782 Mixed hyperlipidemia: Secondary | ICD-10-CM | POA: Diagnosis not present

## 2016-01-12 DIAGNOSIS — G319 Degenerative disease of nervous system, unspecified: Secondary | ICD-10-CM | POA: Insufficient documentation

## 2016-01-12 DIAGNOSIS — I2510 Atherosclerotic heart disease of native coronary artery without angina pectoris: Secondary | ICD-10-CM | POA: Diagnosis not present

## 2016-01-12 DIAGNOSIS — G9349 Other encephalopathy: Secondary | ICD-10-CM | POA: Diagnosis not present

## 2016-01-12 DIAGNOSIS — I482 Chronic atrial fibrillation: Secondary | ICD-10-CM | POA: Diagnosis not present

## 2016-01-12 DIAGNOSIS — I679 Cerebrovascular disease, unspecified: Secondary | ICD-10-CM | POA: Diagnosis not present

## 2016-01-12 DIAGNOSIS — R42 Dizziness and giddiness: Secondary | ICD-10-CM | POA: Diagnosis not present

## 2016-01-12 MED ORDER — GADOBENATE DIMEGLUMINE 529 MG/ML IV SOLN
18.0000 mL | Freq: Once | INTRAVENOUS | Status: AC | PRN
  Administered 2016-01-12: 18 mL via INTRAVENOUS

## 2016-01-19 DIAGNOSIS — I679 Cerebrovascular disease, unspecified: Secondary | ICD-10-CM | POA: Diagnosis not present

## 2016-01-19 DIAGNOSIS — R2689 Other abnormalities of gait and mobility: Secondary | ICD-10-CM | POA: Diagnosis not present

## 2016-01-19 DIAGNOSIS — R531 Weakness: Secondary | ICD-10-CM | POA: Diagnosis not present

## 2016-01-19 DIAGNOSIS — F99 Mental disorder, not otherwise specified: Secondary | ICD-10-CM | POA: Diagnosis not present

## 2016-01-20 DIAGNOSIS — R627 Adult failure to thrive: Secondary | ICD-10-CM | POA: Diagnosis not present

## 2016-01-20 DIAGNOSIS — M542 Cervicalgia: Secondary | ICD-10-CM | POA: Diagnosis not present

## 2016-01-20 DIAGNOSIS — D510 Vitamin B12 deficiency anemia due to intrinsic factor deficiency: Secondary | ICD-10-CM | POA: Diagnosis not present

## 2016-01-20 DIAGNOSIS — R5383 Other fatigue: Secondary | ICD-10-CM | POA: Diagnosis not present

## 2016-02-04 DIAGNOSIS — F99 Mental disorder, not otherwise specified: Secondary | ICD-10-CM | POA: Diagnosis not present

## 2016-02-10 DIAGNOSIS — R627 Adult failure to thrive: Secondary | ICD-10-CM | POA: Diagnosis not present

## 2016-02-10 DIAGNOSIS — Z23 Encounter for immunization: Secondary | ICD-10-CM | POA: Diagnosis not present

## 2016-02-10 DIAGNOSIS — E871 Hypo-osmolality and hyponatremia: Secondary | ICD-10-CM | POA: Diagnosis not present

## 2016-02-10 DIAGNOSIS — R39198 Other difficulties with micturition: Secondary | ICD-10-CM | POA: Diagnosis not present

## 2016-02-10 DIAGNOSIS — I950 Idiopathic hypotension: Secondary | ICD-10-CM | POA: Diagnosis not present

## 2016-02-14 DIAGNOSIS — E782 Mixed hyperlipidemia: Secondary | ICD-10-CM | POA: Diagnosis not present

## 2016-02-14 DIAGNOSIS — I2510 Atherosclerotic heart disease of native coronary artery without angina pectoris: Secondary | ICD-10-CM | POA: Diagnosis not present

## 2016-02-14 DIAGNOSIS — I482 Chronic atrial fibrillation: Secondary | ICD-10-CM | POA: Diagnosis not present

## 2016-02-18 DIAGNOSIS — E871 Hypo-osmolality and hyponatremia: Secondary | ICD-10-CM | POA: Diagnosis not present

## 2016-02-21 DIAGNOSIS — I2510 Atherosclerotic heart disease of native coronary artery without angina pectoris: Secondary | ICD-10-CM | POA: Diagnosis not present

## 2016-02-21 DIAGNOSIS — E782 Mixed hyperlipidemia: Secondary | ICD-10-CM | POA: Diagnosis not present

## 2016-02-21 DIAGNOSIS — I480 Paroxysmal atrial fibrillation: Secondary | ICD-10-CM | POA: Diagnosis not present

## 2016-02-29 ENCOUNTER — Ambulatory Visit: Payer: PPO | Admitting: Cardiovascular Disease

## 2016-03-08 DIAGNOSIS — R627 Adult failure to thrive: Secondary | ICD-10-CM | POA: Diagnosis not present

## 2016-03-08 DIAGNOSIS — E871 Hypo-osmolality and hyponatremia: Secondary | ICD-10-CM | POA: Diagnosis not present

## 2016-03-08 DIAGNOSIS — R5382 Chronic fatigue, unspecified: Secondary | ICD-10-CM | POA: Diagnosis not present

## 2016-03-16 ENCOUNTER — Ambulatory Visit (INDEPENDENT_AMBULATORY_CARE_PROVIDER_SITE_OTHER): Payer: PPO | Admitting: Podiatry

## 2016-03-16 ENCOUNTER — Encounter: Payer: Self-pay | Admitting: Podiatry

## 2016-03-16 DIAGNOSIS — M79674 Pain in right toe(s): Secondary | ICD-10-CM | POA: Diagnosis not present

## 2016-03-16 DIAGNOSIS — B351 Tinea unguium: Secondary | ICD-10-CM

## 2016-03-16 DIAGNOSIS — L84 Corns and callosities: Secondary | ICD-10-CM

## 2016-03-16 DIAGNOSIS — M79675 Pain in left toe(s): Secondary | ICD-10-CM

## 2016-03-16 NOTE — Progress Notes (Signed)
Patient ID: Jacob Reyes, male   DOB: [birth year], 82 y.o.   MRN: [id number]  Subjective: 82 y.o.-year-old male returns the office today for painful, elongated, thickened toenails which he is unable to cut himself. Denies any redness or drainage around the nails. Also states he has painful calluses on the bottom of his feet which are painful with pressure. Denies any acute changes since last appointment and no new complaints today. Denies any systemic complaints such as fevers, chills, nausea, vomiting.   Objective: AAO 3, NAD DP/PT pulses palpable, CRT less than 3 seconds Chronic appearing edema to bilateral lower extremities.  Nails hypertrophic, dystrophic, elongated, brittle, discolored 9. There is tenderness overlying these nails 1-5 bilaterally with the exception of the right 2nd toe as it has previously been amputated. There is no surrounding erythema or drainage along the nail sites. Hyperkerotic lesions bilateral submetatarsal 2 with right > left. Upon debridement, no underlying ulceration, drainage, or clinical signs of infection.  No open lesions or other pre-ulcerative lesions are identified. No other areas of tenderness bilateral lower extremities. Significant HAV deformity bilaterally with hammertoe contracture. No overlying, erythema, increased warmth. No pain with calf compression, warmth, erythema.  Assessment: Patient presents with symptomatic onychomycosis x9, porokeratosis x 2  Plan: -Treatment options including alternatives, risks, complications were discussed -Nails sharply debrided 80 without complication/bleeding. -Hyperkerotic lesion sharply debrided x 2 without complications.  -Discussed daily foot inspection. If there are any changes, to call the office immediately.  -Follow-up in 3 months or sooner if any problems are to arise. In the meantime, encouraged to call the office with any questions, concerns, changes symptoms.  [name], DPM

## 2016-03-20 DIAGNOSIS — R0602 Shortness of breath: Secondary | ICD-10-CM | POA: Diagnosis not present

## 2016-03-20 DIAGNOSIS — I950 Idiopathic hypotension: Secondary | ICD-10-CM | POA: Diagnosis not present

## 2016-03-20 DIAGNOSIS — R627 Adult failure to thrive: Secondary | ICD-10-CM | POA: Diagnosis not present

## 2016-03-20 DIAGNOSIS — E782 Mixed hyperlipidemia: Secondary | ICD-10-CM | POA: Diagnosis not present

## 2016-03-20 DIAGNOSIS — I480 Paroxysmal atrial fibrillation: Secondary | ICD-10-CM | POA: Diagnosis not present

## 2016-03-20 DIAGNOSIS — I482 Chronic atrial fibrillation: Secondary | ICD-10-CM | POA: Diagnosis not present

## 2016-03-20 DIAGNOSIS — I2510 Atherosclerotic heart disease of native coronary artery without angina pectoris: Secondary | ICD-10-CM | POA: Diagnosis not present

## 2016-03-23 ENCOUNTER — Ambulatory Visit: Payer: PPO | Admitting: Podiatry

## 2016-03-23 DIAGNOSIS — H401131 Primary open-angle glaucoma, bilateral, mild stage: Secondary | ICD-10-CM | POA: Diagnosis not present

## 2016-03-24 DIAGNOSIS — I950 Idiopathic hypotension: Secondary | ICD-10-CM | POA: Diagnosis not present

## 2016-03-24 DIAGNOSIS — H401131 Primary open-angle glaucoma, bilateral, mild stage: Secondary | ICD-10-CM | POA: Diagnosis not present

## 2016-04-04 DIAGNOSIS — R627 Adult failure to thrive: Secondary | ICD-10-CM | POA: Diagnosis not present

## 2016-04-04 DIAGNOSIS — I950 Idiopathic hypotension: Secondary | ICD-10-CM | POA: Diagnosis not present

## 2016-04-04 DIAGNOSIS — E538 Deficiency of other specified B group vitamins: Secondary | ICD-10-CM | POA: Diagnosis not present

## 2016-04-04 DIAGNOSIS — D510 Vitamin B12 deficiency anemia due to intrinsic factor deficiency: Secondary | ICD-10-CM | POA: Diagnosis not present

## 2016-04-05 DIAGNOSIS — L821 Other seborrheic keratosis: Secondary | ICD-10-CM | POA: Diagnosis not present

## 2016-04-05 DIAGNOSIS — Z1283 Encounter for screening for malignant neoplasm of skin: Secondary | ICD-10-CM | POA: Diagnosis not present

## 2016-04-05 DIAGNOSIS — D229 Melanocytic nevi, unspecified: Secondary | ICD-10-CM | POA: Diagnosis not present

## 2016-04-05 DIAGNOSIS — L719 Rosacea, unspecified: Secondary | ICD-10-CM | POA: Diagnosis not present

## 2016-04-05 DIAGNOSIS — L820 Inflamed seborrheic keratosis: Secondary | ICD-10-CM | POA: Diagnosis not present

## 2016-04-05 DIAGNOSIS — Z85828 Personal history of other malignant neoplasm of skin: Secondary | ICD-10-CM | POA: Diagnosis not present

## 2016-04-05 DIAGNOSIS — L578 Other skin changes due to chronic exposure to nonionizing radiation: Secondary | ICD-10-CM | POA: Diagnosis not present

## 2016-04-05 DIAGNOSIS — L812 Freckles: Secondary | ICD-10-CM | POA: Diagnosis not present

## 2016-04-05 DIAGNOSIS — D1800 Hemangioma unspecified site: Secondary | ICD-10-CM | POA: Diagnosis not present

## 2016-04-05 DIAGNOSIS — L570 Actinic keratosis: Secondary | ICD-10-CM | POA: Diagnosis not present

## 2016-04-05 DIAGNOSIS — D485 Neoplasm of uncertain behavior of skin: Secondary | ICD-10-CM | POA: Diagnosis not present

## 2016-04-07 DIAGNOSIS — R2689 Other abnormalities of gait and mobility: Secondary | ICD-10-CM | POA: Diagnosis not present

## 2016-04-07 DIAGNOSIS — I679 Cerebrovascular disease, unspecified: Secondary | ICD-10-CM | POA: Diagnosis not present

## 2016-04-07 DIAGNOSIS — R531 Weakness: Secondary | ICD-10-CM | POA: Diagnosis not present

## 2016-04-10 DIAGNOSIS — I480 Paroxysmal atrial fibrillation: Secondary | ICD-10-CM | POA: Diagnosis not present

## 2016-04-10 DIAGNOSIS — E782 Mixed hyperlipidemia: Secondary | ICD-10-CM | POA: Diagnosis not present

## 2016-04-10 DIAGNOSIS — R531 Weakness: Secondary | ICD-10-CM | POA: Diagnosis not present

## 2016-04-10 DIAGNOSIS — R0602 Shortness of breath: Secondary | ICD-10-CM | POA: Diagnosis not present

## 2016-04-10 DIAGNOSIS — I482 Chronic atrial fibrillation: Secondary | ICD-10-CM | POA: Diagnosis not present

## 2016-04-10 DIAGNOSIS — I2510 Atherosclerotic heart disease of native coronary artery without angina pectoris: Secondary | ICD-10-CM | POA: Diagnosis not present

## 2016-04-12 DIAGNOSIS — I950 Idiopathic hypotension: Secondary | ICD-10-CM | POA: Diagnosis not present

## 2016-04-12 DIAGNOSIS — K5900 Constipation, unspecified: Secondary | ICD-10-CM | POA: Diagnosis not present

## 2016-04-28 DIAGNOSIS — I10 Essential (primary) hypertension: Secondary | ICD-10-CM | POA: Diagnosis not present

## 2016-04-28 DIAGNOSIS — G903 Multi-system degeneration of the autonomic nervous system: Secondary | ICD-10-CM | POA: Diagnosis not present

## 2016-04-28 DIAGNOSIS — R627 Adult failure to thrive: Secondary | ICD-10-CM | POA: Diagnosis not present

## 2016-05-03 DIAGNOSIS — R531 Weakness: Secondary | ICD-10-CM | POA: Diagnosis not present

## 2016-05-03 DIAGNOSIS — I959 Hypotension, unspecified: Secondary | ICD-10-CM | POA: Diagnosis not present

## 2016-05-04 DIAGNOSIS — R627 Adult failure to thrive: Secondary | ICD-10-CM | POA: Diagnosis not present

## 2016-05-04 DIAGNOSIS — G903 Multi-system degeneration of the autonomic nervous system: Secondary | ICD-10-CM | POA: Diagnosis not present

## 2016-05-18 ENCOUNTER — Encounter: Payer: Self-pay | Admitting: Podiatry

## 2016-05-18 ENCOUNTER — Ambulatory Visit (INDEPENDENT_AMBULATORY_CARE_PROVIDER_SITE_OTHER): Payer: PPO | Admitting: Podiatry

## 2016-05-18 DIAGNOSIS — B351 Tinea unguium: Secondary | ICD-10-CM | POA: Diagnosis not present

## 2016-05-18 DIAGNOSIS — L851 Acquired keratosis [keratoderma] palmaris et plantaris: Secondary | ICD-10-CM | POA: Diagnosis not present

## 2016-05-18 DIAGNOSIS — L84 Corns and callosities: Secondary | ICD-10-CM | POA: Diagnosis not present

## 2016-05-18 DIAGNOSIS — M79609 Pain in unspecified limb: Secondary | ICD-10-CM

## 2016-05-18 DIAGNOSIS — M79672 Pain in left foot: Secondary | ICD-10-CM

## 2016-05-18 DIAGNOSIS — M79671 Pain in right foot: Secondary | ICD-10-CM

## 2016-05-18 DIAGNOSIS — L608 Other nail disorders: Secondary | ICD-10-CM

## 2016-05-18 DIAGNOSIS — L603 Nail dystrophy: Secondary | ICD-10-CM

## 2016-05-20 NOTE — Progress Notes (Signed)
SUBJECTIVE Patient  presents to office today complaining of elongated, thickened nails. Pain while ambulating in shoes. Patient is unable to trim their own nails.   OBJECTIVE General Patient is awake, alert, and oriented x 3 and in no acute distress. Derm painful callus lesions noted to the bilateral forefoot 2 Skin is dry and supple bilateral. Negative open lesions or macerations. Remaining integument unremarkable. Nails are tender, long, thickened and dystrophic with subungual debris, consistent with onychomycosis, 1-5 bilateral. No signs of infection noted. Vasc  DP and PT pedal pulses palpable bilaterally. Temperature gradient within normal limits.  Neuro Epicritic and protective threshold sensation diminished bilaterally.  Musculoskeletal Exam No symptomatic pedal deformities noted bilateral. Muscular strength within normal limits.  ASSESSMENT 1. Onychodystrophic nails 1-5 bilateral with hyperkeratosis of nails.  2. Onychomycosis of nail due to dermatophyte bilateral 3. Pain in foot bilateral 4. Painful callus lesions bilateral forefoot 2  PLAN OF CARE 1. Patient evaluated today.  2. Instructed to maintain good pedal hygiene and foot care.  3. Mechanical debridement of nails 1-5 bilaterally performed using a nail nipper. Filed with dremel without incident.  4. Excisional debridement of painful callus lesion performed using a chisel blade without incident 5. Return to clinic in 3 mos.    Edrick Kins, DPM [facility]  Dr. Edrick Kins, [address]                                        [place], [place] [address]                Office [phone]  Fax [phone]

## 2016-05-27 DIAGNOSIS — R296 Repeated falls: Secondary | ICD-10-CM | POA: Diagnosis not present

## 2016-05-27 DIAGNOSIS — Z951 Presence of aortocoronary bypass graft: Secondary | ICD-10-CM | POA: Diagnosis not present

## 2016-05-27 DIAGNOSIS — M6281 Muscle weakness (generalized): Secondary | ICD-10-CM | POA: Diagnosis not present

## 2016-05-27 DIAGNOSIS — H409 Unspecified glaucoma: Secondary | ICD-10-CM | POA: Diagnosis not present

## 2016-05-27 DIAGNOSIS — R2689 Other abnormalities of gait and mobility: Secondary | ICD-10-CM | POA: Diagnosis not present

## 2016-05-27 DIAGNOSIS — F329 Major depressive disorder, single episode, unspecified: Secondary | ICD-10-CM | POA: Diagnosis not present

## 2016-05-27 DIAGNOSIS — I10 Essential (primary) hypertension: Secondary | ICD-10-CM | POA: Diagnosis not present

## 2016-05-27 DIAGNOSIS — D510 Vitamin B12 deficiency anemia due to intrinsic factor deficiency: Secondary | ICD-10-CM | POA: Diagnosis not present

## 2016-05-30 DIAGNOSIS — Z951 Presence of aortocoronary bypass graft: Secondary | ICD-10-CM | POA: Diagnosis not present

## 2016-05-30 DIAGNOSIS — R2689 Other abnormalities of gait and mobility: Secondary | ICD-10-CM | POA: Diagnosis not present

## 2016-05-30 DIAGNOSIS — F329 Major depressive disorder, single episode, unspecified: Secondary | ICD-10-CM | POA: Diagnosis not present

## 2016-05-30 DIAGNOSIS — M6281 Muscle weakness (generalized): Secondary | ICD-10-CM | POA: Diagnosis not present

## 2016-05-30 DIAGNOSIS — I10 Essential (primary) hypertension: Secondary | ICD-10-CM | POA: Diagnosis not present

## 2016-05-30 DIAGNOSIS — D510 Vitamin B12 deficiency anemia due to intrinsic factor deficiency: Secondary | ICD-10-CM | POA: Diagnosis not present

## 2016-05-30 DIAGNOSIS — R296 Repeated falls: Secondary | ICD-10-CM | POA: Diagnosis not present

## 2016-05-30 DIAGNOSIS — H409 Unspecified glaucoma: Secondary | ICD-10-CM | POA: Diagnosis not present

## 2016-06-01 DIAGNOSIS — I10 Essential (primary) hypertension: Secondary | ICD-10-CM | POA: Diagnosis not present

## 2016-06-01 DIAGNOSIS — D510 Vitamin B12 deficiency anemia due to intrinsic factor deficiency: Secondary | ICD-10-CM | POA: Diagnosis not present

## 2016-06-01 DIAGNOSIS — F329 Major depressive disorder, single episode, unspecified: Secondary | ICD-10-CM | POA: Diagnosis not present

## 2016-06-01 DIAGNOSIS — R296 Repeated falls: Secondary | ICD-10-CM | POA: Diagnosis not present

## 2016-06-01 DIAGNOSIS — H409 Unspecified glaucoma: Secondary | ICD-10-CM | POA: Diagnosis not present

## 2016-06-01 DIAGNOSIS — R2689 Other abnormalities of gait and mobility: Secondary | ICD-10-CM | POA: Diagnosis not present

## 2016-06-01 DIAGNOSIS — Z951 Presence of aortocoronary bypass graft: Secondary | ICD-10-CM | POA: Diagnosis not present

## 2016-06-01 DIAGNOSIS — M6281 Muscle weakness (generalized): Secondary | ICD-10-CM | POA: Diagnosis not present

## 2016-06-05 ENCOUNTER — Ambulatory Visit (INDEPENDENT_AMBULATORY_CARE_PROVIDER_SITE_OTHER): Payer: PPO | Admitting: Cardiovascular Disease

## 2016-06-05 ENCOUNTER — Encounter: Payer: Self-pay | Admitting: Cardiovascular Disease

## 2016-06-05 VITALS — BP 128/84 | HR 98 | Ht 73.0 in | Wt 184.5 lb

## 2016-06-05 DIAGNOSIS — R0989 Other specified symptoms and signs involving the circulatory and respiratory systems: Secondary | ICD-10-CM

## 2016-06-05 DIAGNOSIS — I480 Paroxysmal atrial fibrillation: Secondary | ICD-10-CM | POA: Diagnosis not present

## 2016-06-05 DIAGNOSIS — R2681 Unsteadiness on feet: Secondary | ICD-10-CM

## 2016-06-05 DIAGNOSIS — Z951 Presence of aortocoronary bypass graft: Secondary | ICD-10-CM | POA: Diagnosis not present

## 2016-06-05 DIAGNOSIS — R42 Dizziness and giddiness: Secondary | ICD-10-CM | POA: Diagnosis not present

## 2016-06-05 DIAGNOSIS — I951 Orthostatic hypotension: Secondary | ICD-10-CM

## 2016-06-05 DIAGNOSIS — I25118 Atherosclerotic heart disease of native coronary artery with other forms of angina pectoris: Secondary | ICD-10-CM | POA: Diagnosis not present

## 2016-06-05 DIAGNOSIS — I10 Essential (primary) hypertension: Secondary | ICD-10-CM

## 2016-06-05 NOTE — Patient Instructions (Addendum)
Medication Instructions:   Please move the prostate medication to evening, after dinner Please take the midodrine one hour before meals Take extra 1/2 pill if needed (total of 15 MG) BACK BRACE  Consider the meal try at the recliner   Labwork:  No new labs needed  Testing/Procedures:  No further testing at this time   I recommend watching educational videos on topics of interest to you at:       [email or web address]  Enter code: [email or web address]    Follow-Up: It was a pleasure seeing you in the office today. Please call us if you have new issues that need to be addressed before your next appt.  [phone]  Your physician wants you to follow-up in: as needed  If you need a refill on your cardiac medications before your next appointment, please call your pharmacy.

## 2016-06-05 NOTE — Progress Notes (Signed)
Cardiology Office Note  Date:  06/05/2016   ID:  KROIX GOETHALS, DOB 05-19-1926, MRN [id number]  PCP:  Rusty Aus, MD   Chief Complaint  Patient presents with  . other    Ref by Dr. Emily Filbert for Hx of CAD s/p CABG x 6 @ [facility]. Meds reviewed by the pt. verbally.  Pt. c/o shortness of breath, weakness and falling.     HPI:  Jacob Reyes is a very pleasant 83 year old gentleman, patient of Dr. Sabra Heck who presents by referral from Dr. Sabra Heck for second opinion consultation of his labile blood pressure, orthostasis, paroxysmal atrial fibrillation Patient reports history of parkinsonian syndrome, had worsening hypotension on Sinemet  Wife and son presents with Mr. Runnion today He reports long history of weakness dating back many months Despite rehabilitation, legs have remained week, has been more sedentary, Following bypass surgery he moved from a walker to a cane Given his recent weakness, now using a walker again Gait instability, periodic falls if he leans forward  Biggest problem is labile blood pressures Long list of blood pressures provided today by the patient showing periodic systolic pressures in the 80s, other times blood pressures up to [id number] systolic He is taking midodrine 3 times per day Often will take the medication with meals, has postprandial hypotension and weakness Periodically blood pressure will be taken when supine, perhaps leading to his high numbers Takes fludrocortisone daily, minimal ankle swelling on the left Wears compression hose Family denies any excessive urination overnight, goes to bathroom perhaps twice  Family also concerned about paroxysmal atrial fibrillation, wondering if it could be contributing to his shortness of breath, weakness Seen previously by Dr. Ubaldo Glassing  and at that time had completed 30 day monitor showing normal sinus rhythm, no atrial fibrillation  Orthostatics done in the office today show stable blood pressure, supine [id number]  systolic, with standing up to 154, heart rate 83 up to 109 with standing   EKG on today's visit shows atrial fibrillation with ventricular rate 98 bpm, nonspecific ST abnormality, rare PVCs   Other hospital records reviewed with him today  Cardiac catheterization July 2016  LM lesion, 40% stenosed.  Ost Ramus lesion, 70% stenosed.  Prox Cx lesion, 80% stenosed.  Mid LAD lesion, 80% stenosed.  Prox RCA lesion, 100% stenosed.  1st Diag lesion, 70% stenosed.  Prox LAD lesion, 70% stenosed.   CABG performed at [facility], CABG x6  Transesophageal echo 12/28/2014 normal LV function, no endocarditis,  Echocardiogram August 2016Showing normal LV function, diastolic relaxation abnormality  Echo 11/17: normal EF, no valve issues   PMH:   has a past medical history of Abnormal stress test; Anxiety; Cancer ([place]); Colon cancer ([place]); Coronary artery disease; Depression; GERD (gastroesophageal reflux disease); Glaucoma; Hearing loss; History of hiatal hernia; Hypertension; and Reflux.  PSH:    Past Surgical History:  Procedure Laterality Date  . CARDIAC CATHETERIZATION N/A 12/10/2014   Procedure: Left Heart Cath and Coronary Angiography;  Surgeon: Teodoro Spray, MD;  Location: [place] [facility];  Service: Cardiovascular;  Laterality: N/A;  . COLON SURGERY    . CORONARY ARTERY BYPASS GRAFT N/A 12/28/2014   Procedure: CORONARY ARTERY BYPASS GRAFTING (CABG), ON PUMP, TIMES SIX, USING LEFT INTERNAL MAMMARY ARTERY, RIGHT GREATER SAPHENOUS VEIN HARVESTED ENDOSCOPICALLY;  Surgeon: [name], MD;  Location: [place];  Service: Open Heart Surgery;  Laterality: N/A;  -SEQ LIMA to MID LAD and DISTAL LAD -SVG to DIAGONAL -SEQ SVG to OM1 and  OM2 -SVG to PDA  . EYE SURGERY     catarcats removed bilataeral /w IOL  . FOOT SURGERY Right 2009   Shorten metatarsal  . HERNIA REPAIR Bilateral 1991   inguinal   . TEE WITHOUT CARDIOVERSION N/A 12/28/2014   Procedure: TRANSESOPHAGEAL ECHOCARDIOGRAM  (TEE);  Surgeon: [name], MD;  Location: [place];  Service: Open Heart Surgery;  Laterality: N/A;  . TONSILLECTOMY      Current Outpatient Prescriptions  Medication Sig Dispense Refill  . aspirin EC 81 MG tablet Take 81 mg by mouth daily.    . brimonidine-timolol (COMBIGAN) 0.2-0.5 % ophthalmic solution Place 1 drop into both eyes 2 (two) times daily.    . cholecalciferol (VITAMIN D) 1000 UNITS tablet Take 1,000 Units by mouth daily.    . finasteride (PROSCAR) 5 MG tablet Take 5 mg by mouth daily after supper.     . Fludrocortisone Acetate (FLORINEF PO) Take 0.1 mg by mouth daily.    Marland Kitchen levothyroxine (SYNTHROID, LEVOTHROID) 75 MCG tablet Take 75 mcg by mouth daily before breakfast.     . LUMIGAN 0.01 % SOLN Place 1 drop into both eyes at bedtime.     . magnesium chloride (SLOW-MAG) 64 MG TBEC SR tablet Take 2 tablets (128 mg total) by mouth daily. 60 tablet   . midodrine (PROAMATINE) 10 MG tablet     . omeprazole (PRILOSEC) 20 MG capsule Take 20 mg by mouth daily before breakfast.     . polyethylene glycol powder (GLYCOLAX/MIRALAX) powder Take 17 g by mouth daily.      No current facility-administered medications for this visit.      Allergies:   Carvedilol; Duloxetine; Paroxetine hcl; and Zaleplon   Social History:  The patient  reports that he has never smoked. He has never used smokeless tobacco. He reports that he does not drink alcohol or use drugs.   Family History:   family history is not on file.    Review of Systems: Review of Systems  Constitutional: Negative.        Gait instability  Respiratory: Negative.   Cardiovascular: Negative.   Gastrointestinal: Negative.   Musculoskeletal: Negative.   Neurological: Positive for dizziness.  Psychiatric/Behavioral: Negative.   All other systems reviewed and are negative.    PHYSICAL EXAM: VS:  BP 128/84 (BP Location: Right Arm, Patient Position: Sitting, Cuff Size: Normal)   Pulse 98   Ht 6\' 1"  (1.854 m)   Wt  184 lb 8 oz (83.7 kg)   BMI 24.34 kg/m  , BMI Body mass index is 24.34 kg/m. GEN: well-nourished,  in no acute distress,   appears somewhat frail, walks with a walker, gait instability  HEENT: normal  Neck: no JVD, carotid bruits, or masses Cardiac:  irregularly irregular,  no murmurs, rubs, or gallops,no edema  Respiratory:  clear to auscultation bilaterally, normal work of breathing GI: soft, nontender, nondistended, + BS MS: no deformity or atrophy  Skin: warm and dry, no rash Neuro:  Strength and sensation are intact Psych: euthymic mood, full affect    Recent Labs: 09/15/2015: ALT 12; BUN 17; Creatinine, Ser 0.97; Hemoglobin 14.6; Platelets 171; Potassium 4.5; Sodium 133    Lipid Panel No results found for: CHOL, HDL, LDLCALC, TRIG    Wt Readings from Last 3 Encounters:  06/05/16 184 lb 8 oz (83.7 kg)  07/01/15 191 lb (86.6 kg)  04/29/15 192 lb (87.1 kg)       ASSESSMENT AND PLAN:   Paroxysmal  atrial fibrillation ([place]) - Plan: EKG 12-Lead Prior history of paroxysmal atrial fibrillation per the notes, Previously seen by Dr. Ubaldo Glassing and at that time was maintaining normal sinus rhythm He is in atrial fibrillation on today's visit Unable to add rate controlling agents given symptomatic hypotension Also currently not on anticoagulation. Long discussion with him concerning risk and benefit of anticoagulation Family reports that he is high fall risk in the setting of his hypotension For now they were in agreement not to start anticoagulation given he is high risk of fall Recommended we discussed this again in the future once blood pressure has stabilized  Coronary artery disease of native artery of native heart with stable angina pectoris ([place]) Currently denies any unstable anginal symptoms. No further testing ordered  S/P CABG x 6 Currently with no symptoms of angina. No further workup at this time. Continue current medication regimen.  Orthostasis Recommended he  take midodrine one hour prior to meals Details concerning for postprandial hypotension Discussed various strategies for avoiding falls from low blood pressure Recommended abdominal binder, compression hose, fluids in the morning Avoiding hot meals and hot liquids which could exacerbate hypotension Recommended checking blood pressure prior to standing up after meal when moving to his recliner Could even consider feeding Mr. Peller in his recliner with a tray If blood pressure drops, could lay the recliner backwards  Labile hypertension 4 high blood pressure, discussed various strategies such as continued monitoring 20 or 30 minutes later with repeat blood pressure. If his continues to run high, could change his position in the recliner to more upright position. Discussed that midodrine can potentially cause hypertension when patient is supine. If symptoms persist, could potentially use half dose sublingual nitroglycerin for emergencies to push blood pressure lower cautiously, short acting agent  Gait instability  suspect weakness is unrelated to atrial fibrillation Ongoing issue, likely neurologic in nature   Total encounter time more than 45 minutes  Greater than 50% was spent in counseling and coordination of care with the patient   Disposition:   F/U  as needed Patient has been seen in the past by Dr. Ubaldo Glassing. Thank you for the opportunity to participate in the care of Mr. Baton I would be happy to see Mr. Latimer in the future if assistance is needed in his care   Orders Placed This Encounter  Procedures  . EKG 12-Lead     Signed, Esmond Plants, M.D., Ph.D. 06/05/2016  [place], [place]

## 2016-06-06 ENCOUNTER — Telehealth: Payer: Self-pay | Admitting: Cardiovascular Disease

## 2016-06-06 NOTE — Telephone Encounter (Signed)
Spoke w/ pt's wife.  Advised her that Dr. Rockey Situ asked me to call regarding blood thinners. Pt was seen in the office yesterday for new pt visit, went over a number of concerns. It was briefly discussed that pt is in afib and would need a blood thinner to reduce stroke risk.  However, pt has had syncopal episodes and the risk of bleeding increases on thinners. Advised her to discuss w/ pt and to weigh the risks and benefits of each.  Also discussed the Watchman device as an option. Mrs. Gasca states that she would like to come in w/ pt and discuss further w/ Dr. Rockey Situ on a day when her son can come w/ them, as he has a great deal of input in their healthcare. Pt sched to see Dr. Rockey Situ on 06/14/16 @ 3:40, as pt comes into town on Friday afternoons. Asked her to call back w/ any questions or concerns before that time.

## 2016-06-09 DIAGNOSIS — G903 Multi-system degeneration of the autonomic nervous system: Secondary | ICD-10-CM | POA: Diagnosis not present

## 2016-06-09 DIAGNOSIS — I4891 Unspecified atrial fibrillation: Secondary | ICD-10-CM | POA: Diagnosis not present

## 2016-06-09 DIAGNOSIS — G20 Parkinson's disease: Secondary | ICD-10-CM | POA: Diagnosis not present

## 2016-06-16 ENCOUNTER — Encounter: Payer: Self-pay | Admitting: Cardiovascular Disease

## 2016-06-16 ENCOUNTER — Ambulatory Visit (INDEPENDENT_AMBULATORY_CARE_PROVIDER_SITE_OTHER): Payer: PPO

## 2016-06-16 ENCOUNTER — Ambulatory Visit (INDEPENDENT_AMBULATORY_CARE_PROVIDER_SITE_OTHER): Payer: PPO | Admitting: Cardiovascular Disease

## 2016-06-16 VITALS — BP 173/94 | HR 66 | Ht 73.0 in | Wt 185.0 lb

## 2016-06-16 DIAGNOSIS — Z951 Presence of aortocoronary bypass graft: Secondary | ICD-10-CM | POA: Diagnosis not present

## 2016-06-16 DIAGNOSIS — I480 Paroxysmal atrial fibrillation: Secondary | ICD-10-CM

## 2016-06-16 DIAGNOSIS — I25118 Atherosclerotic heart disease of native coronary artery with other forms of angina pectoris: Secondary | ICD-10-CM

## 2016-06-16 DIAGNOSIS — R5383 Other fatigue: Secondary | ICD-10-CM

## 2016-06-16 DIAGNOSIS — Z7189 Other specified counseling: Secondary | ICD-10-CM

## 2016-06-16 DIAGNOSIS — J90 Pleural effusion, not elsewhere classified: Secondary | ICD-10-CM

## 2016-06-16 NOTE — Patient Instructions (Signed)
CXR and ?testosterone   Medication Instructions:   No medication changes made  Labwork:  No new labs needed  Testing/Procedures:  We will order a zio monitor to monitor for atrial fib Keep a  Diary of events, BP, pulse   I recommend watching educational videos on topics of interest to you at:       [email or web address]  Enter code: [email or web address]    Follow-Up: It was a pleasure seeing you in the office today. Please call us if you have new issues that need to be addressed before your next appt.  [phone]  Your physician wants you to follow-up in: 3 1/2 weeks   If you need a refill on your cardiac medications before your next appointment, please call your pharmacy.

## 2016-06-16 NOTE — Progress Notes (Signed)
Cardiology Office Note  Date:  06/16/2016   ID:  Jacob Reyes, DOB 05/17/1926, MRN [id number]  PCP:  Jacob Aus, MD   Chief Complaint  Patient presents with  . other    Would like to discuss blood thinners. Meds reviewed by the pt's wife. Pt.c/o weakness and occas. dizziness.     HPI:  Jacob Reyes is a very pleasant 83 year old gentleman, with a history of coronary artery disease, CABG, paroxysmal atrial fibrillation, labile hypertension , periods of orthostasis on midodrine and Florinef , Parkinson's patient of Jacob Reyes who was recently seen in the clinic by referral from Jacob Reyes for consultation of his labile blood pressure, orthostasis, paroxysmal atrial fibrillation  On his last clinic visit, we discussed numerous issues including paroxysmal atrial fibrillation, orthostasis, profound lethargy, weakness. He was in atrial fibrillation on his last clinic visit  Wife and son presents with Jacob Reyes today On today's visit he is back in normal sinus rhythm It is still unclear if arrhythmia is contributing to profound fatigue Wife reports episodes predominately in the morning where he has profound exhaustion, unable to get out of a chair, goes into a deep sleep. These do not correlate with low blood pressure Etiology unclear. When physical therapy arrives to work with him, he is unable to participate when he has one of these episodes Wife calls them his sleepy episodes  General weakness still persists Gait instability, periodic falls if he leans forward  Jacob Reyes has been monitoring heart rate and blood pressure at home She's monitoring heart rate, periodically having heart rate up to 100 On his last clinic visit heart rate was around 100, atrial fibrillation,   On today's visit normal sinus rhythm 66 bpm, blood pressure [id number] systolic  Continued labile blood pressures  taking midodrine 3 times per day  has postprandial hypotension and weakness Takes fludrocortisone  daily, minimal ankle swelling  Wears compression hose  Previous  30 day monitor showing normal sinus rhythm, no atrial fibrillation   EKG on today's visit shows normal sinus rhythm with rate 66 bpm, no significant ST or T-wave changes  Other past medical history reviewed Cardiac catheterization July 2016  LM lesion, 40% stenosed.  Ost Ramus lesion, 70% stenosed.  Prox Cx lesion, 80% stenosed.  Mid LAD lesion, 80% stenosed.  Prox RCA lesion, 100% stenosed.  1st Diag lesion, 70% stenosed.  Prox LAD lesion, 70% stenosed.  CABG performed at [facility], CABG x6  Transesophageal echo 12/28/2014 normal LV function, no endocarditis,  Echocardiogram August 2016Showing normal LV function, diastolic relaxation abnormality  Echo 11/17: normal EF, no valve issues   PMH:   has a past medical history of Abnormal stress test; Anxiety; Cancer ([place]); Colon cancer ([place]); Coronary artery disease; Depression; GERD (gastroesophageal reflux disease); Glaucoma; Hearing loss; History of hiatal hernia; Hypertension; and Reflux.  PSH:    Past Surgical History:  Procedure Laterality Date  . CARDIAC CATHETERIZATION N/A 12/10/2014   Procedure: Left Heart Cath and Coronary Angiography;  Surgeon: Teodoro Spray, MD;  Location: [place] [facility];  Service: Cardiovascular;  Laterality: N/A;  . COLON SURGERY    . CORONARY ARTERY BYPASS GRAFT N/A 12/28/2014   Procedure: CORONARY ARTERY BYPASS GRAFTING (CABG), ON PUMP, TIMES SIX, USING LEFT INTERNAL MAMMARY ARTERY, RIGHT GREATER SAPHENOUS VEIN HARVESTED ENDOSCOPICALLY;  Surgeon: [name], MD;  Location: [place];  Service: Open Heart Surgery;  Laterality: N/A;  -SEQ LIMA to MID LAD and DISTAL LAD -SVG to DIAGONAL -SEQ  SVG to OM1 and OM2 -SVG to PDA  . EYE SURGERY     catarcats removed bilataeral /w IOL  . FOOT SURGERY Right 2009   Shorten metatarsal  . HERNIA REPAIR Bilateral 1991   inguinal   . TEE WITHOUT CARDIOVERSION N/A 12/28/2014    Procedure: TRANSESOPHAGEAL ECHOCARDIOGRAM (TEE);  Surgeon: [name], MD;  Location: [place];  Service: Open Heart Surgery;  Laterality: N/A;  . TONSILLECTOMY      Current Outpatient Prescriptions  Medication Sig Dispense Refill  . aspirin EC 81 MG tablet Take 81 mg by mouth daily.    . brimonidine-timolol (COMBIGAN) 0.2-0.5 % ophthalmic solution Place 1 drop into both eyes 2 (two) times daily.    . carbidopa-levodopa (SINEMET IR) 25-250 MG tablet Take 0.5 tablets by mouth 3 (three) times daily.    . cholecalciferol (VITAMIN D) 1000 UNITS tablet Take 1,000 Units by mouth daily.    . finasteride (PROSCAR) 5 MG tablet Take 5 mg by mouth daily after supper.     . Fludrocortisone Acetate (FLORINEF PO) Take 0.1 mg by mouth daily.    Marland Kitchen levothyroxine (SYNTHROID, LEVOTHROID) 75 MCG tablet Take 75 mcg by mouth daily before breakfast.     . LUMIGAN 0.01 % SOLN Place 1 drop into both eyes at bedtime.     . magnesium chloride (SLOW-MAG) 64 MG TBEC SR tablet Take 2 tablets (128 mg total) by mouth daily. 60 tablet   . midodrine (PROAMATINE) 10 MG tablet     . omeprazole (PRILOSEC) 20 MG capsule Take 20 mg by mouth daily before breakfast.     . polyethylene glycol powder (GLYCOLAX/MIRALAX) powder Take 17 g by mouth daily.      No current facility-administered medications for this visit.      Allergies:   Carvedilol; Duloxetine; Paroxetine hcl; and Zaleplon   Social History:  The patient  reports that he has never smoked. He has never used smokeless tobacco. He reports that he does not drink alcohol or use drugs.   Family History:   family history is not on file.    Review of Systems: Review of Systems  Constitutional: Positive for malaise/fatigue.  Respiratory: Negative.   Cardiovascular: Negative.   Gastrointestinal: Negative.   Musculoskeletal: Negative.   Neurological: Positive for weakness.  Psychiatric/Behavioral: Positive for memory loss.  All other systems reviewed and are  negative.    PHYSICAL EXAM: VS:  BP (!) 173/94 (BP Location: Left Arm, Patient Position: Sitting, Cuff Size: Normal)   Pulse 66   Ht 6\' 1"  (1.854 m)   Wt 185 lb (83.9 kg)   BMI 24.41 kg/m  , BMI Body mass index is 24.41 kg/m. GEN: Well nourished, well developed, in no acute distress  HEENT: normal  Neck: no JVD, carotid bruits, or masses Cardiac: RRR; no murmurs, rubs, or gallops,no edema  Respiratory:  clear to auscultation bilaterally, normal work of breathing GI: soft, nontender, nondistended, + BS MS: no deformity or atrophy  Skin: warm and dry, no rash Neuro:  Strength and sensation are intact Psych: euthymic mood, full affect    Recent Labs: 09/15/2015: ALT 12; BUN 17; Creatinine, Ser 0.97; Hemoglobin 14.6; Platelets 171; Potassium 4.5; Sodium 133    Lipid Panel No results found for: CHOL, HDL, LDLCALC, TRIG    Wt Readings from Last 3 Encounters:  06/16/16 185 lb (83.9 kg)  06/05/16 184 lb 8 oz (83.7 kg)  07/01/15 191 lb (86.6 kg)       ASSESSMENT  AND PLAN:  Paroxysmal atrial fibrillation ([facility]) -  We have recommended two-week monitor given continued questions concerning association between his arrhythmia and profound fatigue episodes.  Mrs. Bilal will keep an accurate diary of fatigue episodes and we will try to correlate this to underlying arrhythmia  Coronary artery disease of native artery of native heart with stable angina pectoris ([place]) - Plan: EKG 12-Lead, LONG TERM MONITOR (3-14 DAYS) Currently with no symptoms of angina. No further workup at this time. Continue current medication regimen.  S/P CABG x 6  Encounter for anticoagulation discussion and counseling Long discussion concerning risk and benefit of anticoagulation Gait is unsteady though no recent falls. Family is not particularly eager to start anticoagulation. They're concerned of complications. We will use event monitor to estimate atrial fibrillation burden  Fatigue, unspecified  type Etiology unclear, Recommended he talk with Jacob Reyes concerning checking testosterone level  Pleural effusion Poor inspiratory effort on exam, unable to exclude pleural effusion left base Recommended they discuss this with Jacob Reyes, may benefit from repeat chest x-ray. Last in November 2017, results not available for our review View be high risk of complication with thoracentesis. Does not seem to be having side effects such as profound shortness of breath on exertion from a pleural effusion that would warrant a procedure. This was discussed with Mrs. Herbison  Long discussion concerning above  Very complicated, difficult questions addressed  Total encounter time more than 45 minutes  Greater than 50% was spent in counseling and coordination of care with the patient   Disposition:   F/U  6 months   Orders Placed This Encounter  Procedures  . LONG TERM MONITOR (3-14 DAYS)  . EKG 12-Lead     Signed, Esmond Plants, M.D., Ph.D. 06/16/2016  [place], [place]

## 2016-06-19 DIAGNOSIS — I480 Paroxysmal atrial fibrillation: Secondary | ICD-10-CM

## 2016-06-19 DIAGNOSIS — I25118 Atherosclerotic heart disease of native coronary artery with other forms of angina pectoris: Secondary | ICD-10-CM

## 2016-06-21 DIAGNOSIS — J90 Pleural effusion, not elsewhere classified: Secondary | ICD-10-CM | POA: Diagnosis not present

## 2016-06-21 DIAGNOSIS — E039 Hypothyroidism, unspecified: Secondary | ICD-10-CM | POA: Diagnosis not present

## 2016-06-21 DIAGNOSIS — E538 Deficiency of other specified B group vitamins: Secondary | ICD-10-CM | POA: Diagnosis not present

## 2016-06-21 DIAGNOSIS — I480 Paroxysmal atrial fibrillation: Secondary | ICD-10-CM | POA: Diagnosis not present

## 2016-06-27 DIAGNOSIS — I10 Essential (primary) hypertension: Secondary | ICD-10-CM | POA: Diagnosis not present

## 2016-06-27 DIAGNOSIS — M6281 Muscle weakness (generalized): Secondary | ICD-10-CM | POA: Diagnosis not present

## 2016-06-27 DIAGNOSIS — Z951 Presence of aortocoronary bypass graft: Secondary | ICD-10-CM | POA: Diagnosis not present

## 2016-06-27 DIAGNOSIS — D510 Vitamin B12 deficiency anemia due to intrinsic factor deficiency: Secondary | ICD-10-CM | POA: Diagnosis not present

## 2016-06-27 DIAGNOSIS — R296 Repeated falls: Secondary | ICD-10-CM | POA: Diagnosis not present

## 2016-06-27 DIAGNOSIS — H409 Unspecified glaucoma: Secondary | ICD-10-CM | POA: Diagnosis not present

## 2016-06-27 DIAGNOSIS — R2689 Other abnormalities of gait and mobility: Secondary | ICD-10-CM | POA: Diagnosis not present

## 2016-06-27 DIAGNOSIS — F329 Major depressive disorder, single episode, unspecified: Secondary | ICD-10-CM | POA: Diagnosis not present

## 2016-06-29 DIAGNOSIS — L820 Inflamed seborrheic keratosis: Secondary | ICD-10-CM | POA: Diagnosis not present

## 2016-06-29 DIAGNOSIS — L578 Other skin changes due to chronic exposure to nonionizing radiation: Secondary | ICD-10-CM | POA: Diagnosis not present

## 2016-06-29 DIAGNOSIS — D485 Neoplasm of uncertain behavior of skin: Secondary | ICD-10-CM | POA: Diagnosis not present

## 2016-06-29 DIAGNOSIS — L821 Other seborrheic keratosis: Secondary | ICD-10-CM | POA: Diagnosis not present

## 2016-06-29 DIAGNOSIS — L570 Actinic keratosis: Secondary | ICD-10-CM | POA: Diagnosis not present

## 2016-06-29 DIAGNOSIS — I8312 Varicose veins of left lower extremity with inflammation: Secondary | ICD-10-CM | POA: Diagnosis not present

## 2016-06-29 DIAGNOSIS — Z85828 Personal history of other malignant neoplasm of skin: Secondary | ICD-10-CM | POA: Diagnosis not present

## 2016-06-29 DIAGNOSIS — D0422 Carcinoma in situ of skin of left ear and external auricular canal: Secondary | ICD-10-CM | POA: Diagnosis not present

## 2016-06-29 DIAGNOSIS — D692 Other nonthrombocytopenic purpura: Secondary | ICD-10-CM | POA: Diagnosis not present

## 2016-06-29 DIAGNOSIS — I8311 Varicose veins of right lower extremity with inflammation: Secondary | ICD-10-CM | POA: Diagnosis not present

## 2016-06-30 ENCOUNTER — Ambulatory Visit: Payer: PPO | Admitting: Cardiovascular Disease

## 2016-06-30 DIAGNOSIS — I4891 Unspecified atrial fibrillation: Secondary | ICD-10-CM | POA: Diagnosis not present

## 2016-06-30 DIAGNOSIS — G903 Multi-system degeneration of the autonomic nervous system: Secondary | ICD-10-CM | POA: Diagnosis not present

## 2016-06-30 DIAGNOSIS — G20 Parkinson's disease: Secondary | ICD-10-CM | POA: Diagnosis not present

## 2016-06-30 DIAGNOSIS — R531 Weakness: Secondary | ICD-10-CM | POA: Diagnosis not present

## 2016-07-06 DIAGNOSIS — M6281 Muscle weakness (generalized): Secondary | ICD-10-CM | POA: Diagnosis not present

## 2016-07-06 DIAGNOSIS — D510 Vitamin B12 deficiency anemia due to intrinsic factor deficiency: Secondary | ICD-10-CM | POA: Diagnosis not present

## 2016-07-06 DIAGNOSIS — F329 Major depressive disorder, single episode, unspecified: Secondary | ICD-10-CM | POA: Diagnosis not present

## 2016-07-06 DIAGNOSIS — R2689 Other abnormalities of gait and mobility: Secondary | ICD-10-CM | POA: Diagnosis not present

## 2016-07-06 DIAGNOSIS — Z951 Presence of aortocoronary bypass graft: Secondary | ICD-10-CM | POA: Diagnosis not present

## 2016-07-06 DIAGNOSIS — R296 Repeated falls: Secondary | ICD-10-CM | POA: Diagnosis not present

## 2016-07-06 DIAGNOSIS — H409 Unspecified glaucoma: Secondary | ICD-10-CM | POA: Diagnosis not present

## 2016-07-06 DIAGNOSIS — I10 Essential (primary) hypertension: Secondary | ICD-10-CM | POA: Diagnosis not present

## 2016-07-14 ENCOUNTER — Ambulatory Visit: Payer: PPO | Admitting: Cardiovascular Disease

## 2016-07-21 ENCOUNTER — Ambulatory Visit: Payer: PPO | Admitting: Podiatry

## 2016-07-26 DIAGNOSIS — Z7401 Bed confinement status: Secondary | ICD-10-CM | POA: Diagnosis not present

## 2016-07-26 DIAGNOSIS — R6889 Other general symptoms and signs: Secondary | ICD-10-CM | POA: Diagnosis not present

## 2016-07-28 ENCOUNTER — Ambulatory Visit: Payer: PPO | Admitting: Cardiovascular Disease

## 2016-08-08 ENCOUNTER — Encounter
Admission: RE | Admit: 2016-08-08 | Discharge: 2016-08-08 | Disposition: A | Discharge status: Home / Self Care | Payer: PPO | Source: Ambulatory Visit | Attending: Internal Medicine | Admitting: Internal Medicine

## 2016-08-08 DIAGNOSIS — Z7401 Bed confinement status: Secondary | ICD-10-CM | POA: Diagnosis not present

## 2016-08-08 DIAGNOSIS — R6889 Other general symptoms and signs: Secondary | ICD-10-CM | POA: Diagnosis not present

## 2016-08-11 DIAGNOSIS — I2510 Atherosclerotic heart disease of native coronary artery without angina pectoris: Secondary | ICD-10-CM | POA: Diagnosis not present

## 2016-08-11 DIAGNOSIS — I951 Orthostatic hypotension: Secondary | ICD-10-CM | POA: Diagnosis not present

## 2016-08-11 DIAGNOSIS — E039 Hypothyroidism, unspecified: Secondary | ICD-10-CM | POA: Diagnosis not present

## 2016-08-11 DIAGNOSIS — R627 Adult failure to thrive: Secondary | ICD-10-CM | POA: Diagnosis not present

## 2016-08-12 ENCOUNTER — Other Ambulatory Visit
Admission: RE | Admit: 2016-08-12 | Discharge: 2016-08-12 | Disposition: A | Discharge status: Home / Self Care | Payer: PPO | Source: Ambulatory Visit | Attending: Internal Medicine | Admitting: Internal Medicine

## 2016-08-12 DIAGNOSIS — R300 Dysuria: Secondary | ICD-10-CM | POA: Insufficient documentation

## 2016-08-12 DIAGNOSIS — R531 Weakness: Secondary | ICD-10-CM | POA: Diagnosis not present

## 2016-08-12 DIAGNOSIS — R109 Unspecified abdominal pain: Secondary | ICD-10-CM | POA: Insufficient documentation

## 2016-08-12 LAB — URINALYSIS, COMPLETE (UACMP) WITH MICROSCOPIC
BILIRUBIN URINE: NEGATIVE
Bacteria, UA: NONE SEEN
Glucose, UA: NEGATIVE mg/dL
Ketones, ur: NEGATIVE mg/dL
Nitrite: NEGATIVE
PROTEIN: 100 mg/dL — AB
SQUAMOUS EPITHELIAL / LPF: NONE SEEN
Specific Gravity, Urine: 1.029 (ref 1.005–1.030)
pH: 6 (ref 5.0–8.0)

## 2016-08-14 LAB — URINE CULTURE

## 2016-08-17 ENCOUNTER — Non-Acute Institutional Stay (SKILLED_NURSING_FACILITY): Payer: Medicare Other | Admitting: Gerontology

## 2016-08-17 DIAGNOSIS — T83091A Other mechanical complication of indwelling urethral catheter, initial encounter: Secondary | ICD-10-CM

## 2016-08-17 DIAGNOSIS — G20 Parkinson's disease: Secondary | ICD-10-CM | POA: Diagnosis not present

## 2016-08-17 DIAGNOSIS — R630 Anorexia: Secondary | ICD-10-CM

## 2016-08-20 ENCOUNTER — Encounter
Admission: RE | Admit: 2016-08-20 | Discharge: 2016-08-20 | Disposition: A | Discharge status: Home / Self Care | Payer: PPO | Source: Ambulatory Visit | Attending: Internal Medicine | Admitting: Internal Medicine

## 2016-08-21 ENCOUNTER — Non-Acute Institutional Stay (SKILLED_NURSING_FACILITY): Payer: Medicare Other | Admitting: Gerontology

## 2016-08-21 DIAGNOSIS — G20 Parkinson's disease: Secondary | ICD-10-CM | POA: Insufficient documentation

## 2016-08-21 DIAGNOSIS — J810 Acute pulmonary edema: Secondary | ICD-10-CM | POA: Diagnosis not present

## 2016-08-21 DIAGNOSIS — G20A1 Parkinson's disease without dyskinesia, without mention of fluctuations: Secondary | ICD-10-CM | POA: Insufficient documentation

## 2016-08-21 DIAGNOSIS — R630 Anorexia: Secondary | ICD-10-CM | POA: Insufficient documentation

## 2016-08-21 NOTE — Progress Notes (Signed)
Location:      Place of Service:  SNF (31) Provider:  Toni Arthurs, NP-C  Rusty Aus, MD  Patient Care Team: Rusty Aus, MD as PCP - General (Internal Medicine) Teodoro Spray, MD as Consulting Physician (Cardiology) Minna Merritts, MD as Consulting Physician (Cardiology)  Extended Emergency Contact Information Primary Emergency Contact: [name] Address: [address]          [place], [place] [address] [name] of [place] Phone: [phone] Relation: Spouse Secondary Emergency Contact: [place] of [place] Phone: [phone] Relation: Daughter  Code Status:  dnr Goals of care: Advanced Directive information Advanced Directives 09/15/2015  Does Patient Have a Medical Advance Directive? No  Type of Advance Directive -  Does patient want to make changes to medical advance directive? -  Copy of [place] in Chart? -  Would patient like information on creating a medical advance directive? No - patient declined information     Chief Complaint  Patient presents with  . Follow-up    HPI:  Pt is a 83 y.o. male seen today for a f/u s/p admission from the [facility]. Pt was at the Hospice home but needed placement in LTC facility. Pt has been doing well since admission. His pain is well managed. His appetite is poor- he wants to eat but nothing seems to be appealing. He is open to having an appetite stimulant. He does get very tired after sitting up in a chair for any extended period of time. Otherwise, he is stable. VSS. No other complaints.   Past Medical History:  Diagnosis Date  . Abnormal stress test   . Anxiety    just started med. for anxiety   . Cancer [facility])    prostate  . Colon cancer ([place])   . Coronary artery disease   . Depression   . GERD (gastroesophageal reflux disease)   . Glaucoma   . Hearing loss   . History of hiatal hernia   . Hypertension   . Reflux    Past  Surgical History:  Procedure Laterality Date  . CARDIAC CATHETERIZATION N/A 12/10/2014   Procedure: Left Heart Cath and Coronary Angiography;  Surgeon: Teodoro Spray, MD;  Location: [facility] CV LAB;  Service: Cardiovascular;  Laterality: N/A;  . COLON SURGERY    . CORONARY ARTERY BYPASS GRAFT N/A 12/28/2014   Procedure: CORONARY ARTERY BYPASS GRAFTING (CABG), ON PUMP, TIMES SIX, USING LEFT INTERNAL MAMMARY ARTERY, RIGHT GREATER SAPHENOUS VEIN HARVESTED ENDOSCOPICALLY;  Surgeon: [name], MD;  Location: [facility];  Service: Open Heart Surgery;  Laterality: N/A;  -SEQ LIMA to MID LAD and DISTAL LAD -SVG to DIAGONAL -SEQ SVG to OM1 and OM2 -SVG to PDA  . EYE SURGERY     catarcats removed bilataeral /w IOL  . FOOT SURGERY Right 2009   Shorten metatarsal  . HERNIA REPAIR Bilateral 1991   inguinal   . TEE WITHOUT CARDIOVERSION N/A 12/28/2014   Procedure: TRANSESOPHAGEAL ECHOCARDIOGRAM (TEE);  Surgeon: [name], MD;  Location: [facility];  Service: Open Heart Surgery;  Laterality: N/A;  . TONSILLECTOMY      Allergies  Allergen Reactions  . Carvedilol Other (See Comments)  . Duloxetine Other (See Comments)    Passing out  . Paroxetine Hcl Other (See Comments)    Hypotension  . Zaleplon Other (See Comments)    Allergies as of 08/17/2016      Reactions  Carvedilol Other (See Comments)   Duloxetine Other (See Comments)   Passing out   Paroxetine Hcl Other (See Comments)   Hypotension   Zaleplon Other (See Comments)      Medication List       Accurate as of 08/17/16 11:59 PM. Always use your most recent med list.          aspirin EC 81 MG tablet Take 81 mg by mouth daily.   carbidopa-levodopa 25-250 MG tablet Commonly known as:  SINEMET IR Take 0.5 tablets by mouth 3 (three) times daily.   cholecalciferol 1000 units tablet Commonly known as:  VITAMIN D Take 1,000 Units by mouth daily.   COMBIGAN 0.2-0.5 % ophthalmic solution Generic drug:   brimonidine-timolol Place 1 drop into both eyes 2 (two) times daily.   finasteride 5 MG tablet Commonly known as:  PROSCAR Take 5 mg by mouth daily after supper.   FLORINEF PO Take 0.1 mg by mouth daily.   LUMIGAN 0.01 % Soln Generic drug:  bimatoprost Place 1 drop into both eyes at bedtime.   magnesium chloride 64 MG Tbec SR tablet Commonly known as:  SLOW-MAG Take 2 tablets (128 mg total) by mouth daily.   midodrine 10 MG tablet Commonly known as:  PROAMATINE   omeprazole 20 MG capsule Commonly known as:  PRILOSEC Take 20 mg by mouth daily before breakfast.   polyethylene glycol powder powder Commonly known as:  GLYCOLAX/MIRALAX Take 17 g by mouth daily.       Review of Systems  Constitutional: Positive for appetite change and fatigue. Negative for activity change, chills, diaphoresis and fever.  HENT: Negative for congestion, sneezing, sore throat, trouble swallowing and voice change.   Respiratory: Negative for apnea, cough, choking, chest tightness, shortness of breath and wheezing.   Cardiovascular: Negative for chest pain, palpitations and leg swelling.  Gastrointestinal: Negative for abdominal distention, abdominal pain, constipation, diarrhea and nausea.  Genitourinary: Positive for difficulty urinating (foley in place). Negative for dysuria, frequency and urgency.  Musculoskeletal: Negative for arthralgias (typical arthritis), back pain, gait problem and myalgias.  Skin: Negative for color change, pallor, rash and wound.  Neurological: Negative for dizziness, tremors, syncope, speech difficulty, weakness, numbness and headaches.  Psychiatric/Behavioral: Negative for agitation and behavioral problems.  All other systems reviewed and are negative.    There is no immunization history on file for this patient. Pertinent  Health Maintenance Due  Topic Date Due  . PNA vac Low Risk Adult (1 of 2 - PCV13) 06/13/1992  . INFLUENZA VACCINE  12/20/2016   Fall Risk   02/23/2015  Falls in the past year? No  Risk for fall due to : History of fall(s);Impaired balance/gait   Functional Status Survey:    Vitals:   08/12/16 0500  BP: 98/62  Pulse: 77  Resp: 20  Temp: 97.5 F (36.4 C)  SpO2: 94%   There is no height or weight on file to calculate BMI. Physical Exam  Constitutional: He is oriented to person, place, and time. Vital signs are normal. He appears well-developed and well-nourished. He is active and cooperative. He does not appear ill. No distress.  HENT:  Head: Normocephalic and atraumatic.  Mouth/Throat: Uvula is midline, oropharynx is clear and moist and mucous membranes are normal. Mucous membranes are not pale, not dry and not cyanotic.  Eyes: Conjunctivae, EOM and lids are normal. Pupils are equal, round, and reactive to light.  Neck: Trachea normal, normal range of motion and full passive range  of motion without pain. Neck supple. No JVD present. No tracheal deviation, no edema and no erythema present. No thyromegaly present.  Cardiovascular: Normal rate, regular rhythm, normal heart sounds, intact distal pulses and normal pulses.  Exam reveals no gallop, no distant heart sounds and no friction rub.   No murmur heard. Pulmonary/Chest: Effort normal and breath sounds normal. No accessory muscle usage. No respiratory distress. He has no wheezes. He has no rales. He exhibits no tenderness.  Abdominal: Normal appearance and bowel sounds are normal. He exhibits no distension and no ascites. There is no tenderness.  Genitourinary:  Genitourinary Comments: Foley in place  Musculoskeletal: Normal range of motion. He exhibits no edema or tenderness.  Expected osteoarthritis, stiffness  Neurological: He is alert and oriented to person, place, and time. He has normal strength.  Skin: Skin is warm, dry and intact. No rash noted. He is not diaphoretic. No cyanosis or erythema. No pallor. Nails show no clubbing.  Psychiatric: He has a normal mood and  affect. His speech is normal and behavior is normal. Judgment and thought content normal. Cognition and memory are normal.  Nursing note and vitals reviewed.   Labs reviewed:  Recent Labs  09/15/15 1136  NA 133*  K 4.5  CL 102  CO2 24  GLUCOSE 104*  BUN 17  CREATININE 0.97  CALCIUM 8.2*    Recent Labs  09/15/15 1136  AST 20  ALT 12*  ALKPHOS 66  BILITOT 1.1  PROT 5.6*  ALBUMIN 3.4*    Recent Labs  09/15/15 1136  WBC 7.3  NEUTROABS 4.7  HGB 14.6  HCT 42.2  MCV 92.7  PLT 171   Lab Results  Component Value Date   TSH 3.385 12/30/2014   Lab Results  Component Value Date   HGBA1C 5.7 (H) 12/25/2014   No results found for: CHOL, HDL, LDLCALC, LDLDIRECT, TRIG, CHOLHDL  Significant Diagnostic Results in last 30 days:  No results found.  Assessment/Plan 1. Poor appetite  Mirtazapine 7.5 mg PO Q HS- appetite stimulant  Wife to provide Ensure supplements if pt doesn't eat   2. Obstruction of Foley catheter, initial encounter ([place])  Flush foley with 30 mL NS TID and prn  3. Parkinson disease ([place])  Continue in facility [facility]ospice Care  Safety Precautions  Continue Roxanol 20 mg/ml- 0.25-0.5 mL po Q 1 hour prn   Family/ staff Communication:   Total Time:  Documentation:  Face to Face:  Family/Phone:   Labs/tests ordered:    Vikki Ports, NP-C [facility] [address], [place] [address] Cell Phone (Mon-Fri 8am-5pm):  [phone] On Call:  [phone] & follow prompts after 5pm & weekends Office Phone:  [phone] Office Fax:  [phone]

## 2016-08-21 NOTE — Progress Notes (Signed)
Location:      Place of Service:  SNF (31) Provider:  Toni Arthurs, NP-C  Rusty Aus, MD  Patient Care Team: Rusty Aus, MD as PCP - General (Internal Medicine) Teodoro Spray, MD as Consulting Physician (Cardiology) Minna Merritts, MD as Consulting Physician (Cardiology)  Extended Emergency Contact Information Primary Emergency Contact: [name] Address: [address]          [place], [place] [address] [name] of [place] Phone: [phone] Relation: Spouse Secondary Emergency Contact: [place] of [place] Phone: [phone] Relation: Daughter  Code Status:  DNR Goals of care: Advanced Directive information Advanced Directives 09/15/2015  Does Patient Have a Medical Advance Directive? No  Type of Advance Directive -  Does patient want to make changes to medical advance directive? -  Copy of [place] in Chart? -  Would patient like information on creating a medical advance directive? No - patient declined information     Chief Complaint  Patient presents with  . Acute Visit    HPI:  Pt is a 83 y.o. male seen today for an acute visit for pulmonary edema. Pt had a sudden change overnight. Pt had been stable, lungs clear, no dyspnea, no O2 need. Today, pt is lethargic, having dyspnea, O2 sats 84% on RA. Congested, "wet" sounding cough. Voice very wet sounding. Unable to project voice. Pt denies chest pain. Hospice RN started O2 at 2.5 L [place]. Sats now 94%. Now, pt denies dyspnea. Mildly tachycardic. Fingers cyanotic. 3-4+ pitting edema in Left foot. Staff nurse gave dose of Roxanol this morning for the dyspnea, causing some lethargy. Family is ok with the lethargy if he is able to breathe easier. Wife would like CXR and abt if appropriate to see if he will respond to treatment. Will continue to monitor closely. Pulmonary edema/ CHF vs transition to EOL   Past Medical History:  Diagnosis Date  . Abnormal  stress test   . Anxiety    just started med. for anxiety   . Cancer [facility])    prostate  . Colon cancer ([place])   . Coronary artery disease   . Depression   . GERD (gastroesophageal reflux disease)   . Glaucoma   . Hearing loss   . History of hiatal hernia   . Hypertension   . Reflux    Past Surgical History:  Procedure Laterality Date  . CARDIAC CATHETERIZATION N/A 12/10/2014   Procedure: Left Heart Cath and Coronary Angiography;  Surgeon: Teodoro Spray, MD;  Location: [facility];  Service: Cardiovascular;  Laterality: N/A;  . COLON SURGERY    . CORONARY ARTERY BYPASS GRAFT N/A 12/28/2014   Procedure: CORONARY ARTERY BYPASS GRAFTING (CABG), ON PUMP, TIMES SIX, USING LEFT INTERNAL MAMMARY ARTERY, RIGHT GREATER SAPHENOUS VEIN HARVESTED ENDOSCOPICALLY;  Surgeon: [name], MD;  Location: [facility];  Service: Open Heart Surgery;  Laterality: N/A;  -SEQ LIMA to MID LAD and DISTAL LAD -SVG to DIAGONAL -SEQ SVG to OM1 and OM2 -SVG to PDA  . EYE SURGERY     catarcats removed bilataeral /w IOL  . FOOT SURGERY Right 2009   Shorten metatarsal  . HERNIA REPAIR Bilateral 1991   inguinal   . TEE WITHOUT CARDIOVERSION N/A 12/28/2014   Procedure: TRANSESOPHAGEAL ECHOCARDIOGRAM (TEE);  Surgeon: [name], MD;  Location: [facility];  Service: Open Heart Surgery;  Laterality: N/A;  . TONSILLECTOMY      Allergies  Allergen Reactions  . Carvedilol Other (See Comments)  . Duloxetine Other (See Comments)    Passing out  . Paroxetine Hcl Other (See Comments)    Hypotension  . Zaleplon Other (See Comments)    Allergies as of 08/21/2016      Reactions   Carvedilol Other (See Comments)   Duloxetine Other (See Comments)   Passing out   Paroxetine Hcl Other (See Comments)   Hypotension   Zaleplon Other (See Comments)      Medication List       Accurate as of 08/21/16 11:46 AM. Always use your most recent med list.          aspirin EC 81 MG tablet Take 81 mg by mouth daily.    carbidopa-levodopa 25-250 MG tablet Commonly known as:  SINEMET IR Take 0.5 tablets by mouth 3 (three) times daily.   cholecalciferol 1000 units tablet Commonly known as:  VITAMIN D Take 1,000 Units by mouth daily.   COMBIGAN 0.2-0.5 % ophthalmic solution Generic drug:  brimonidine-timolol Place 1 drop into both eyes 2 (two) times daily.   finasteride 5 MG tablet Commonly known as:  PROSCAR Take 5 mg by mouth daily after supper.   FLORINEF PO Take 0.1 mg by mouth daily.   levothyroxine 75 MCG tablet Commonly known as:  SYNTHROID, LEVOTHROID Take 75 mcg by mouth daily before breakfast.   LUMIGAN 0.01 % Soln Generic drug:  bimatoprost Place 1 drop into both eyes at bedtime.   magnesium chloride 64 MG Tbec SR tablet Commonly known as:  SLOW-MAG Take 2 tablets (128 mg total) by mouth daily.   midodrine 10 MG tablet Commonly known as:  PROAMATINE   omeprazole 20 MG capsule Commonly known as:  PRILOSEC Take 20 mg by mouth daily before breakfast.   polyethylene glycol powder powder Commonly known as:  GLYCOLAX/MIRALAX Take 17 g by mouth daily.       Review of Systems  Constitutional: Positive for activity change and appetite change. Negative for chills, diaphoresis and fever.  HENT: Negative for congestion, sneezing, sore throat, trouble swallowing and voice change.   Respiratory: Positive for cough, shortness of breath and wheezing. Negative for apnea, choking and chest tightness.   Cardiovascular: Positive for leg swelling. Negative for chest pain and palpitations.  Gastrointestinal: Negative for abdominal distention, abdominal pain, constipation, diarrhea and nausea.  Genitourinary: Positive for difficulty urinating (foley in place). Negative for dysuria, frequency and urgency.  Musculoskeletal: Negative for arthralgias (typical arthritis), back pain, gait problem and myalgias.  Skin: Positive for pallor. Negative for color change, rash and wound.  Neurological:  Negative for dizziness, tremors, syncope, speech difficulty, weakness, numbness and headaches.  Psychiatric/Behavioral: Negative for agitation and behavioral problems.  All other systems reviewed and are negative.    There is no immunization history on file for this patient. Pertinent  Health Maintenance Due  Topic Date Due  . PNA vac Low Risk Adult (1 of 2 - PCV13) 06/13/1992  . INFLUENZA VACCINE  12/20/2016   Fall Risk  02/23/2015  Falls in the past year? No  Risk for fall due to : History of fall(s);Impaired balance/gait   Functional Status Survey:    There were no vitals filed for this visit. There is no height or weight on file to calculate BMI. Physical Exam  Constitutional: He is oriented to person, place, and time. He appears well-developed. He appears lethargic. He appears cachectic. He is active and cooperative. He appears ill. No distress. Nasal cannula  in place.  HENT:  Head: Normocephalic and atraumatic.  Mouth/Throat: Uvula is midline, oropharynx is clear and moist and mucous membranes are normal. Mucous membranes are not pale, not dry and not cyanotic.  Eyes: Conjunctivae, EOM and lids are normal. Pupils are equal, round, and reactive to light.  Neck: Trachea normal, normal range of motion and full passive range of motion without pain. Neck supple. No JVD present. No tracheal deviation, no edema and no erythema present. No thyromegaly present.  Cardiovascular: Regular rhythm, intact distal pulses and normal pulses.  Tachycardia present.  Exam reveals distant heart sounds. Exam reveals no gallop and no friction rub.   No murmur heard. Pulses:      Dorsalis pedis pulses are 2+ on the right side, and 2+ on the left side.  3-4+ Pitting edema- left foot  Pulmonary/Chest: No accessory muscle usage. He is in respiratory distress (mild). He has decreased breath sounds in the right lower field and the left lower field. He has wheezes in the right upper field, the right middle  field, the left upper field and the left middle field. He has rhonchi in the right upper field, the right middle field, the left upper field and the left middle field. He has rales in the right upper field, the right middle field, the left upper field and the left middle field. He exhibits no tenderness.  Abdominal: Normal appearance. He exhibits no distension and no ascites. Bowel sounds are decreased. There is no tenderness.  Genitourinary:  Genitourinary Comments: Foley in place  Musculoskeletal: Normal range of motion. He exhibits no edema or tenderness.  Expected osteoarthritis, stiffness  Neurological: He is oriented to person, place, and time. He has normal strength. He appears lethargic.  Skin: Skin is warm, dry and intact. He is not diaphoretic. There is cyanosis (fingernail beds). There is pallor. Nails show no clubbing.  Psychiatric: He has a normal mood and affect. His speech is normal and behavior is normal. Judgment and thought content normal. Cognition and memory are normal.  Nursing note and vitals reviewed.   Labs reviewed:  Recent Labs  09/15/15 1136  NA 133*  K 4.5  CL 102  CO2 24  GLUCOSE 104*  BUN 17  CREATININE 0.97  CALCIUM 8.2*    Recent Labs  09/15/15 1136  AST 20  ALT 12*  ALKPHOS 66  BILITOT 1.1  PROT 5.6*  ALBUMIN 3.4*    Recent Labs  09/15/15 1136  WBC 7.3  NEUTROABS 4.7  HGB 14.6  HCT 42.2  MCV 92.7  PLT 171   Lab Results  Component Value Date   TSH 3.385 12/30/2014   Lab Results  Component Value Date   HGBA1C 5.7 (H) 12/25/2014   No results found for: CHOL, HDL, LDLCALC, LDLDIRECT, TRIG, CHOLHDL  Significant Diagnostic Results in last 30 days:  No results found.  Assessment/Plan 1. Acute pulmonary edema (HCC)  2 view CXR  Furosemide 40 mg IM x 1 now  Duonebs QID scheduled  O2 2-5 L via [place]- titrate to maintain sats >90% or for comfort  PRN Roxanol for Dyspnea  Family/ staff Communication:   Total  Time:  Documentation:  Face to Face:  Family/Phone: family in the room, spoke with Hospice nurse   Labs/tests ordered:  2 view CXR  Medication list reviewed and assessed for continued appropriateness.  Vikki Ports, NP-C [facility] [phone] [place] [address] Cell Phone (Mon-Fri 8am-5pm):  [phone] On Call:  [phone] & follow prompts after 5pm & weekends Office Phone:  [phone] Office Fax:  [phone]

## 2016-09-18 ENCOUNTER — Other Ambulatory Visit
Admission: RE | Admit: 2016-09-18 | Discharge: 2016-09-18 | Disposition: A | Discharge status: Home / Self Care | Payer: PPO | Source: Skilled Nursing Facility | Attending: Gerontology | Admitting: Gerontology

## 2016-09-18 DIAGNOSIS — R300 Dysuria: Secondary | ICD-10-CM | POA: Diagnosis not present

## 2016-09-18 LAB — URINALYSIS, COMPLETE (UACMP) WITH MICROSCOPIC
Bilirubin Urine: NEGATIVE
Glucose, UA: NEGATIVE mg/dL
KETONES UR: NEGATIVE mg/dL
Nitrite: NEGATIVE
PROTEIN: 30 mg/dL — AB
SQUAMOUS EPITHELIAL / LPF: NONE SEEN
Specific Gravity, Urine: 1.024 (ref 1.005–1.030)
pH: 5 (ref 5.0–8.0)

## 2016-09-19 ENCOUNTER — Encounter
Admission: RE | Admit: 2016-09-19 | Discharge: 2016-09-19 | Disposition: A | Discharge status: Home / Self Care | Payer: PPO | Source: Ambulatory Visit | Attending: Internal Medicine | Admitting: Internal Medicine

## 2016-09-19 DIAGNOSIS — R300 Dysuria: Secondary | ICD-10-CM | POA: Insufficient documentation

## 2016-09-19 LAB — URINE CULTURE

## 2016-09-20 DIAGNOSIS — R300 Dysuria: Secondary | ICD-10-CM | POA: Diagnosis not present

## 2016-09-20 LAB — URINALYSIS, COMPLETE (UACMP) WITH MICROSCOPIC
BACTERIA UA: NONE SEEN
Bilirubin Urine: NEGATIVE
Glucose, UA: NEGATIVE mg/dL
Ketones, ur: NEGATIVE mg/dL
NITRITE: NEGATIVE
PROTEIN: 100 mg/dL — AB
Specific Gravity, Urine: 1.02 (ref 1.005–1.030)
pH: 6 (ref 5.0–8.0)

## 2016-09-21 LAB — URINE CULTURE

## 2016-10-09 ENCOUNTER — Non-Acute Institutional Stay (SKILLED_NURSING_FACILITY): Payer: Medicare Other | Admitting: Gerontology

## 2016-10-09 DIAGNOSIS — T83511A Infection and inflammatory reaction due to indwelling urethral catheter, initial encounter: Secondary | ICD-10-CM

## 2016-10-09 DIAGNOSIS — N390 Urinary tract infection, site not specified: Secondary | ICD-10-CM

## 2016-10-09 DIAGNOSIS — R630 Anorexia: Secondary | ICD-10-CM

## 2016-10-09 DIAGNOSIS — R600 Localized edema: Secondary | ICD-10-CM

## 2016-10-09 DIAGNOSIS — R300 Dysuria: Secondary | ICD-10-CM | POA: Diagnosis not present

## 2016-10-09 LAB — URINALYSIS, COMPLETE (UACMP) WITH MICROSCOPIC
Bilirubin Urine: NEGATIVE
Glucose, UA: NEGATIVE mg/dL
Hgb urine dipstick: NEGATIVE
Ketones, ur: NEGATIVE mg/dL
Nitrite: POSITIVE — AB
Protein, ur: 100 mg/dL — AB
SQUAMOUS EPITHELIAL / LPF: NONE SEEN
Specific Gravity, Urine: 1.017 (ref 1.005–1.030)
pH: 8 (ref 5.0–8.0)

## 2016-10-09 NOTE — Progress Notes (Signed)
Location:      Place of Service:  SNF (31) Provider:  Toni Arthurs, NP-C  Rusty Aus, MD  Patient Care Team: Rusty Aus, MD as PCP - General (Internal Medicine) Ubaldo Glassing Javier Docker, MD as Consulting Physician (Cardiology) Minna Merritts, MD as Consulting Physician (Cardiology)  Extended Emergency Contact Information Primary Emergency Contact: [name] Address: [address]          [place], [place] [address] [name] of [place] Phone: [phone] Mobile Phone: [phone] Relation: Spouse Secondary Emergency Contact: [place] of [place] Phone: [phone] Relation: Daughter  Code Status:  DNR Goals of care: Advanced Directive information Advanced Directives 09/15/2015  Does Patient Have a Medical Advance Directive? No  Type of Advance Directive -  Does patient want to make changes to medical advance directive? -  Copy of [place] in Chart? -  Would patient like information on creating a medical advance directive? No - patient declined information     Chief Complaint  Patient presents with  . Acute Visit    HPI:  Pt is a 83 y.o. male seen today for an acute visit for UTI. Nursing reports pt has been having sediment in the foley tubing. Last night, nursing was unable to flush the tubing. They changed the foley out and sent a urine as the urine was thick appearing and dark with sediment. Preliminary UA results show positive Nitrates. Pt denies n/v/d/f/c/cp/sob/ha/abd pain/dizziness/CV pain. Pt does present with generalized BLE edema with scrotal edema. Mild. Non-painful. Otherwise, pt reports he is feeling well.VSS. No other complaints. Currently being followed by Hospice, as well.    Past Medical History:  Diagnosis Date  . Abnormal stress test   . Anxiety    just started med. for anxiety   . Cancer [facility])    prostate  . Colon cancer ([place])   . Coronary artery disease   . Depression   . GERD  (gastroesophageal reflux disease)   . Glaucoma   . Hearing loss   . History of hiatal hernia   . Hypertension   . Reflux    Past Surgical History:  Procedure Laterality Date  . CARDIAC CATHETERIZATION N/A 12/10/2014   Procedure: Left Heart Cath and Coronary Angiography;  Surgeon: Teodoro Spray, MD;  Location: [place] CV LAB;  Service: Cardiovascular;  Laterality: N/A;  . COLON SURGERY    . CORONARY ARTERY BYPASS GRAFT N/A 12/28/2014   Procedure: CORONARY ARTERY BYPASS GRAFTING (CABG), ON PUMP, TIMES SIX, USING LEFT INTERNAL MAMMARY ARTERY, RIGHT GREATER SAPHENOUS VEIN HARVESTED ENDOSCOPICALLY;  Surgeon: [name], MD;  Location: [place];  Service: Open Heart Surgery;  Laterality: N/A;  -SEQ LIMA to MID LAD and DISTAL LAD -SVG to DIAGONAL -SEQ SVG to OM1 and OM2 -SVG to PDA  . EYE SURGERY     catarcats removed bilataeral /w IOL  . FOOT SURGERY Right 2009   Shorten metatarsal  . HERNIA REPAIR Bilateral 1991   inguinal   . TEE WITHOUT CARDIOVERSION N/A 12/28/2014   Procedure: TRANSESOPHAGEAL ECHOCARDIOGRAM (TEE);  Surgeon: [name], MD;  Location: [place];  Service: Open Heart Surgery;  Laterality: N/A;  . TONSILLECTOMY      Allergies  Allergen Reactions  . Carvedilol Other (See Comments)  . Duloxetine Other (See Comments)    Passing out  . Paroxetine Hcl Other (See Comments)    Hypotension  . Zaleplon Other (See Comments)    Allergies as of  10/09/2016      Reactions   Carvedilol Other (See Comments)   Duloxetine Other (See Comments)   Passing out   Paroxetine Hcl Other (See Comments)   Hypotension   Zaleplon Other (See Comments)      Medication List       Accurate as of 10/09/16  1:01 PM. Always use your most recent med list.          aspirin EC 81 MG tablet Take 81 mg by mouth daily.   carbidopa-levodopa 25-250 MG tablet Commonly known as:  SINEMET IR Take 0.5 tablets by mouth 3 (three) times daily.   cholecalciferol 1000 units  tablet Commonly known as:  VITAMIN D Take 1,000 Units by mouth daily.   COMBIGAN 0.2-0.5 % ophthalmic solution Generic drug:  brimonidine-timolol Place 1 drop into both eyes 2 (two) times daily.   finasteride 5 MG tablet Commonly known as:  PROSCAR Take 5 mg by mouth daily after supper.   FLORINEF PO Take 0.1 mg by mouth daily.   levothyroxine 75 MCG tablet Commonly known as:  SYNTHROID, LEVOTHROID Take 75 mcg by mouth daily before breakfast.   LUMIGAN 0.01 % Soln Generic drug:  bimatoprost Place 1 drop into both eyes at bedtime.   magnesium chloride 64 MG Tbec SR tablet Commonly known as:  SLOW-MAG Take 2 tablets (128 mg total) by mouth daily.   midodrine 10 MG tablet Commonly known as:  PROAMATINE   omeprazole 20 MG capsule Commonly known as:  PRILOSEC Take 20 mg by mouth daily before breakfast.   polyethylene glycol powder powder Commonly known as:  GLYCOLAX/MIRALAX Take 17 g by mouth daily.       Review of Systems  Constitutional: Negative for activity change, appetite change, chills, diaphoresis and fever.  HENT: Negative for congestion, sneezing, sore throat, trouble swallowing and voice change.   Respiratory: Negative for apnea, cough, choking, chest tightness, shortness of breath and wheezing.   Cardiovascular: Positive for leg swelling. Negative for chest pain and palpitations.  Gastrointestinal: Negative for abdominal distention, abdominal pain, constipation, diarrhea and nausea.  Genitourinary: Positive for difficulty urinating (foley in place) and scrotal swelling. Negative for dysuria, frequency and urgency.  Musculoskeletal: Negative for arthralgias (typical arthritis), back pain, gait problem and myalgias.  Skin: Positive for pallor. Negative for color change, rash and wound.  Neurological: Negative for dizziness, tremors, syncope, speech difficulty, weakness, numbness and headaches.  Psychiatric/Behavioral: Negative for agitation and behavioral  problems.  All other systems reviewed and are negative.    There is no immunization history on file for this patient. Pertinent  Health Maintenance Due  Topic Date Due  . PNA vac Low Risk Adult (1 of 2 - PCV13) 06/13/1992  . INFLUENZA VACCINE  12/20/2016   Fall Risk  02/23/2015  Falls in the past year? No  Risk for fall due to : History of fall(s);Impaired balance/gait   Functional Status Survey:    Vitals:   09/22/16 0800  BP: 104/75  Pulse: 72  Resp: 16  Temp: 98.1 F (36.7 C)  SpO2: 94%  Weight: 175 lb 3.2 oz (79.5 kg)   Body mass index is 23.11 kg/m. Physical Exam  Constitutional: He is oriented to person, place, and time. Vital signs are normal. He appears well-developed. He appears cachectic. He is active and cooperative. He does not appear ill. No distress. Nasal cannula in place.  HENT:  Head: Normocephalic and atraumatic.  Mouth/Throat: Uvula is midline, oropharynx is clear and moist and mucous  membranes are normal. Mucous membranes are not pale, not dry and not cyanotic.  Eyes: Conjunctivae, EOM and lids are normal. Pupils are equal, round, and reactive to light.  Neck: Trachea normal, normal range of motion and full passive range of motion without pain. Neck supple. No JVD present. No tracheal deviation, no edema and no erythema present. No thyromegaly present.  Cardiovascular: Normal rate, regular rhythm, intact distal pulses and normal pulses.  Exam reveals no gallop, no distant heart sounds and no friction rub.   No murmur heard. Pulses:      Dorsalis pedis pulses are 2+ on the right side, and 2+ on the left side.  3-4+ Pitting edema-BLE  Pulmonary/Chest: Breath sounds normal. No accessory muscle usage. No respiratory distress. He has no decreased breath sounds. He has no wheezes. He has no rhonchi. He has no rales. He exhibits no tenderness.  Abdominal: Normal appearance. He exhibits no distension and no ascites. Bowel sounds are decreased. There is no  tenderness.  Genitourinary: Right testis shows swelling. Left testis shows swelling.  Genitourinary Comments: Foley in place  Musculoskeletal: Normal range of motion. He exhibits no edema or tenderness.  Expected osteoarthritis, stiffness  Neurological: He is alert and oriented to person, place, and time. He has normal strength.  Skin: Skin is warm, dry and intact. No rash noted. He is not diaphoretic. No cyanosis or erythema. There is pallor. Nails show no clubbing.  Psychiatric: He has a normal mood and affect. His speech is normal and behavior is normal. Judgment and thought content normal. Cognition and memory are normal.  Nursing note and vitals reviewed.   Labs reviewed: No results for input(s): NA, K, CL, CO2, GLUCOSE, BUN, CREATININE, CALCIUM, MG, PHOS in the last 8760 hours. No results for input(s): AST, ALT, ALKPHOS, BILITOT, PROT, ALBUMIN in the last 8760 hours. No results for input(s): WBC, NEUTROABS, HGB, HCT, MCV, PLT in the last 8760 hours. Lab Results  Component Value Date   TSH 3.385 12/30/2014   Lab Results  Component Value Date   HGBA1C 5.7 (H) 12/25/2014   No results found for: CHOL, HDL, LDLCALC, LDLDIRECT, TRIG, CHOLHDL  Significant Diagnostic Results in last 30 days:  No results found.  Assessment/Plan 1. Urinary tract infection associated with indwelling urethral catheter, initial encounter (HCC)  Cipro 250 mg po Q 12 hours x 14 days  NS flushes for catheter TID prn  Remacidin 30 ml flushes BID- flush catheter, clamp tubing for 30 minutes, unclamp  2. Localized edema  Elevate BLE on pillows  Family/ staff Communication:   Total Time:  Documentation:  Face to Face:  Family/Phone: updated Hospice RN   Labs/tests ordered:  UA, C&S  Medication list reviewed and assessed for continued appropriateness.  Vikki Ports, NP-C Geriatrics [facility] [phone], [place] [address] Cell Phone  (Mon-Fri 8am-5pm):  [phone] On Call:  [phone] & follow prompts after 5pm & weekends Office Phone:  [phone] Office Fax:  [phone]

## 2016-10-10 LAB — URINE CULTURE

## 2016-10-20 ENCOUNTER — Encounter
Admission: RE | Admit: 2016-10-20 | Discharge: 2016-10-20 | Disposition: A | Discharge status: Home / Self Care | Payer: PPO | Source: Ambulatory Visit | Attending: Internal Medicine | Admitting: Internal Medicine

## 2016-10-23 ENCOUNTER — Non-Acute Institutional Stay (SKILLED_NURSING_FACILITY): Payer: Medicare Other | Admitting: Gerontology

## 2016-10-23 DIAGNOSIS — H16139 Photokeratitis, unspecified eye: Secondary | ICD-10-CM | POA: Diagnosis not present

## 2016-11-08 DIAGNOSIS — H16139 Photokeratitis, unspecified eye: Secondary | ICD-10-CM | POA: Insufficient documentation

## 2016-11-08 NOTE — Progress Notes (Signed)
Location:      Place of Service:  SNF (31) Provider:  Toni Arthurs, NP-C  Rusty Aus, MD  Patient Care Team: Rusty Aus, MD as PCP - General (Internal Medicine) Ubaldo Glassing Javier Docker, MD as Consulting Physician (Cardiology) Minna Merritts, MD as Consulting Physician (Cardiology)  Extended Emergency Contact Information Primary Emergency Contact: [name] Address: [address]          [place], [place] [address] [name] of [place] Phone: [phone] Mobile Phone: [phone] Relation: Spouse Secondary Emergency Contact: [place] of [place] Phone: [phone] Relation: Daughter  Code Status:  DNR Goals of care: Advanced Directive information Advanced Directives 09/15/2015  Does Patient Have a Medical Advance Directive? No  Type of Advance Directive -  Does patient want to make changes to medical advance directive? -  Copy of [place] in Chart? -  Would patient like information on creating a medical advance directive? No - patient declined information     Chief Complaint  Patient presents with  . Acute Visit    HPI:  Pt is a 83 y.o. male seen today for an acute visit for evaluation of several areas of Actinic Keratosis on the Right ear, left ear and scalp. Pt is currently on Hospice services. Pt is easily fatigued with increased activity, such as going to doctors appointments. Pt declined to go to the Dermatologist initially, unless in-house interventions do not help. Pt has several areas of rough, irritated skin on the areas of the head with significant sun exposure. The are tender to touch, friable. Pt wants to try a cream to treat the areas. Otherwise, no other complaints.    Past Medical History:  Diagnosis Date  . Abnormal stress test   . Anxiety    just started med. for anxiety   . Cancer [facility])    prostate  . Colon cancer ([place])   . Coronary artery disease   . Depression   . GERD  (gastroesophageal reflux disease)   . Glaucoma   . Hearing loss   . History of hiatal hernia   . Hypertension   . Reflux    Past Surgical History:  Procedure Laterality Date  . CARDIAC CATHETERIZATION N/A 12/10/2014   Procedure: Left Heart Cath and Coronary Angiography;  Surgeon: Teodoro Spray, MD;  Location: [place] [facility];  Service: Cardiovascular;  Laterality: N/A;  . COLON SURGERY    . CORONARY ARTERY BYPASS GRAFT N/A 12/28/2014   Procedure: CORONARY ARTERY BYPASS GRAFTING (CABG), ON PUMP, TIMES SIX, USING LEFT INTERNAL MAMMARY ARTERY, RIGHT GREATER SAPHENOUS VEIN HARVESTED ENDOSCOPICALLY;  Surgeon: [name], MD;  Location: [place];  Service: Open Heart Surgery;  Laterality: N/A;  -SEQ LIMA to MID LAD and DISTAL LAD -SVG to DIAGONAL -SEQ SVG to OM1 and OM2 -SVG to PDA  . EYE SURGERY     catarcats removed bilataeral /w IOL  . FOOT SURGERY Right 2009   Shorten metatarsal  . HERNIA REPAIR Bilateral 1991   inguinal   . TEE WITHOUT CARDIOVERSION N/A 12/28/2014   Procedure: TRANSESOPHAGEAL ECHOCARDIOGRAM (TEE);  Surgeon: [name], MD;  Location: [place];  Service: Open Heart Surgery;  Laterality: N/A;  . TONSILLECTOMY      Allergies  Allergen Reactions  . Carvedilol Other (See Comments)  . Duloxetine Other (See Comments)    Passing out  . Paroxetine Hcl Other (See Comments)    Hypotension  . Zaleplon Other (See Comments)  Allergies as of 10/23/2016      Reactions   Carvedilol Other (See Comments)   Duloxetine Other (See Comments)   Passing out   Paroxetine Hcl Other (See Comments)   Hypotension   Zaleplon Other (See Comments)      Medication List       Accurate as of 10/23/16 11:59 PM. Always use your most recent med list.          aspirin EC 81 MG tablet Take 81 mg by mouth daily.   carbidopa-levodopa 25-250 MG tablet Commonly known as:  SINEMET IR Take 0.5 tablets by mouth 3 (three) times daily.   cholecalciferol 1000 units tablet Commonly  known as:  VITAMIN D Take 1,000 Units by mouth daily.   COMBIGAN 0.2-0.5 % ophthalmic solution Generic drug:  brimonidine-timolol Place 1 drop into both eyes 2 (two) times daily.   finasteride 5 MG tablet Commonly known as:  PROSCAR Take 5 mg by mouth daily after supper.   FLORINEF PO Take 0.1 mg by mouth daily.   LUMIGAN 0.01 % Soln Generic drug:  bimatoprost Place 1 drop into both eyes at bedtime.   magnesium chloride 64 MG Tbec SR tablet Commonly known as:  SLOW-MAG Take 2 tablets (128 mg total) by mouth daily.   midodrine 10 MG tablet Commonly known as:  PROAMATINE   omeprazole 20 MG capsule Commonly known as:  PRILOSEC Take 20 mg by mouth daily before breakfast.   polyethylene glycol powder powder Commonly known as:  GLYCOLAX/MIRALAX Take 17 g by mouth daily.       Review of Systems  Constitutional: Negative for activity change, appetite change, chills, diaphoresis and fever.  HENT: Negative.   Respiratory: Negative.   Cardiovascular: Negative.   Gastrointestinal: Negative.   Genitourinary: Negative.   Musculoskeletal: Negative.  Arthralgias: typical arthritis.  Skin: Positive for pallor and wound. Negative for color change and rash.  Neurological: Negative.   Hematological: Negative.   Psychiatric/Behavioral: Negative.   All other systems reviewed and are negative.    There is no immunization history on file for this patient. Pertinent  Health Maintenance Due  Topic Date Due  . PNA vac Low Risk Adult (1 of 2 - PCV13) 06/13/1992  . INFLUENZA VACCINE  12/20/2016   Fall Risk  02/23/2015  Falls in the past year? No  Risk for fall due to : History of fall(s);Impaired balance/gait   Functional Status Survey:    Vitals:   10/13/16 0740  BP: (!) 144/95  Pulse: 71  Resp: 16  Temp: 97.8 F (36.6 C)  SpO2: 98%   There is no height or weight on file to calculate BMI. Physical Exam  Constitutional: He appears well-developed and well-nourished. No  distress.  HENT:  Head: Normocephalic.    Actinic Keratosis  Eyes: Pupils are equal, round, and reactive to light. Right eye exhibits no discharge. Left eye exhibits no discharge.  Neck: No JVD present. No thyromegaly present.  Pulmonary/Chest: Effort normal.  Skin: He is not diaphoretic.    Labs reviewed: No results for input(s): NA, K, CL, CO2, GLUCOSE, BUN, CREATININE, CALCIUM, MG, PHOS in the last 8760 hours. No results for input(s): AST, ALT, ALKPHOS, BILITOT, PROT, ALBUMIN in the last 8760 hours. No results for input(s): WBC, NEUTROABS, HGB, HCT, MCV, PLT in the last 8760 hours. Lab Results  Component Value Date   TSH 3.385 12/30/2014   Lab Results  Component Value Date   HGBA1C 5.7 (H) 12/25/2014   No results  found for: CHOL, HDL, LDLCALC, LDLDIRECT, TRIG, CHOLHDL  Significant Diagnostic Results in last 30 days:  No results found.  Assessment/Plan 1. Actinic keratitis, unspecified laterality  Imiquimod 5% cream. Apply thin film to areas of AK 5 nights/ week.   Wash cream off with soap and water in the am  F/U with dermatologist if areas do not improve  Family/ staff Communication:   Total Time:  Documentation:  Face to Face:  Family/Phone:   Labs/tests ordered:    Medication list reviewed and assessed for continued appropriateness.  Vikki Ports, NP-C Geriatrics [facility] [phone],  [address] Cell Phone (Mon-Fri 8am-5pm):  [phone] On Call:  [phone] & follow prompts after 5pm & weekends Office Phone:  [phone] Office Fax:  [phone]

## 2016-11-19 ENCOUNTER — Encounter
Admission: RE | Admit: 2016-11-19 | Discharge: 2016-11-19 | Disposition: A | Discharge status: Home / Self Care | Payer: PPO | Source: Ambulatory Visit | Attending: Internal Medicine | Admitting: Internal Medicine

## 2016-12-19 ENCOUNTER — Non-Acute Institutional Stay (SKILLED_NURSING_FACILITY): Payer: Medicare Other | Admitting: Gerontology

## 2016-12-19 ENCOUNTER — Encounter: Payer: Self-pay | Admitting: Gerontology

## 2016-12-19 DIAGNOSIS — I959 Hypotension, unspecified: Secondary | ICD-10-CM

## 2016-12-19 DIAGNOSIS — I25119 Atherosclerotic heart disease of native coronary artery with unspecified angina pectoris: Secondary | ICD-10-CM | POA: Diagnosis not present

## 2016-12-19 DIAGNOSIS — Z8546 Personal history of malignant neoplasm of prostate: Secondary | ICD-10-CM

## 2016-12-19 DIAGNOSIS — F329 Major depressive disorder, single episode, unspecified: Secondary | ICD-10-CM

## 2016-12-19 DIAGNOSIS — G20 Parkinson's disease: Secondary | ICD-10-CM | POA: Diagnosis not present

## 2016-12-19 DIAGNOSIS — R0600 Dyspnea, unspecified: Secondary | ICD-10-CM

## 2016-12-19 DIAGNOSIS — H409 Unspecified glaucoma: Secondary | ICD-10-CM | POA: Diagnosis not present

## 2016-12-19 DIAGNOSIS — G20A1 Parkinson's disease without dyskinesia, without mention of fluctuations: Secondary | ICD-10-CM

## 2016-12-19 DIAGNOSIS — F419 Anxiety disorder, unspecified: Secondary | ICD-10-CM

## 2016-12-19 DIAGNOSIS — R339 Retention of urine, unspecified: Secondary | ICD-10-CM | POA: Diagnosis not present

## 2016-12-19 DIAGNOSIS — E039 Hypothyroidism, unspecified: Secondary | ICD-10-CM

## 2016-12-19 DIAGNOSIS — I480 Paroxysmal atrial fibrillation: Secondary | ICD-10-CM | POA: Diagnosis not present

## 2016-12-19 DIAGNOSIS — R630 Anorexia: Secondary | ICD-10-CM

## 2016-12-19 DIAGNOSIS — E871 Hypo-osmolality and hyponatremia: Secondary | ICD-10-CM | POA: Diagnosis not present

## 2016-12-19 DIAGNOSIS — K219 Gastro-esophageal reflux disease without esophagitis: Secondary | ICD-10-CM | POA: Diagnosis not present

## 2016-12-19 DIAGNOSIS — M6281 Muscle weakness (generalized): Secondary | ICD-10-CM

## 2016-12-19 DIAGNOSIS — I209 Angina pectoris, unspecified: Secondary | ICD-10-CM

## 2016-12-19 NOTE — Progress Notes (Signed)
Location:   [facility] Room Number: 177L Place of Service:  SNF [phone]) Provider:  Toni Arthurs, NP-C  Rusty Aus, MD  Patient Care Team: Rusty Aus, MD as PCP - General (Internal Medicine) Ubaldo Glassing Javier Docker, MD as Consulting Physician (Cardiology) Minna Merritts, MD as Consulting Physician (Cardiology)  Extended Emergency Contact Information Primary Emergency Contact: [name] Address: [address]          [place], [place] [address] [name] of [place] Phone: [phone] Mobile Phone: [phone] Relation: Spouse Secondary Emergency Contact: [place] of [place] Phone: [phone] Relation: Daughter  Code Status:  DNR Goals of care: Advanced Directive information Advanced Directives 12/19/2016  Does Patient Have a Medical Advance Directive? Yes  Type of Advance Directive Out of facility DNR (pink MOST or yellow form)  Does patient want to make changes to medical advance directive? No - Patient declined  Copy of [place] in Chart? -  Would patient like information on creating a medical advance directive? -     Chief Complaint  Patient presents with  . Medical Management of Chronic Issues    Routine Visit    HPI:  Pt is a 83 y.o. male seen today for medical management of chronic diseases. Pt has been stable over the past month. He is on Hospice services for EOL care r/t parkinson's disease. He is bedbound, gets OOB with use of a Hoyer lift. Pt is pleasant, always smiling. Appetite is fair. Having regular BMs with occasional constipation. Denies cough, congestion at this time. On scheduled Duonebs for ease of administration d/t weakness. No falls this month. Denies pain. Intermittent dyspnea. Pt denies n/v/d/f/c/cp/ha/abd pain/dizziness. Pt continues with persistent hypotension but is asymptomatic. VSS. No other complaints.    Past Medical History:  Diagnosis Date  . Abnormal  stress test   . Acquired hypothyroidism    unspecified  . AF (paroxysmal atrial fibrillation) ([facility] [facility] [place])   . Anxiety    just started med. for anxiety   . Cancer [facility])    prostate  . Colon cancer ([place]) 11/16/2013   partial colectomy  . Coronary artery disease   . Depression    unspecified  . GERD (gastroesophageal reflux disease)   . Glaucoma   . Hearing loss   . History of hiatal hernia   . Hyperlipidemia 11/16/2013  . Hypertension   . Pernicious anemia   . Prostate cancer ([place]) 11/16/2013   1/8 bx pos, waiting  . Reflux   . Reflux esophagitis 11/16/2013   Past Surgical History:  Procedure Laterality Date  . CARDIAC CATHETERIZATION N/A 12/10/2014   Procedure: Left Heart Cath and Coronary Angiography;  Surgeon: Teodoro Spray, MD;  Location: [facility];  Service: Cardiovascular;  Laterality: N/A;  . CATARACT EXTRACTION    . COLON SURGERY    . COLONOSCOPY    . CORONARY ARTERY BYPASS GRAFT N/A 12/28/2014   Procedure: CORONARY ARTERY BYPASS GRAFTING (CABG), ON PUMP, TIMES SIX, USING LEFT INTERNAL MAMMARY ARTERY, RIGHT GREATER SAPHENOUS VEIN HARVESTED ENDOSCOPICALLY;  Surgeon: [name], MD;  Location: [facility];  Service: Open Heart Surgery;  Laterality: N/A;  -SEQ LIMA to MID LAD and DISTAL LAD -SVG to DIAGONAL -SEQ SVG to OM1 and OM2 -SVG to PDA  . EYE SURGERY     catarcats removed bilataeral /w IOL  . FOOT SURGERY Right 2009   Shorten metatarsal  . HERNIA REPAIR Bilateral 1991  inguinal   . PARTIAL COLECTOMY     WITH ANANSTOMSIS  . TEE WITHOUT CARDIOVERSION N/A 12/28/2014   Procedure: TRANSESOPHAGEAL ECHOCARDIOGRAM (TEE);  Surgeon: [name], MD;  Location: [facility];  Service: Open Heart Surgery;  Laterality: N/A;  . TONSILLECTOMY      Allergies  Allergen Reactions  . Carvedilol Other (See Comments)  . Duloxetine Other (See Comments)    Passing out  . Paroxetine Hcl Other (See Comments)    Hypotension  . Zaleplon Other (See Comments)    Allergies  as of 12/19/2016      Reactions   Carvedilol Other (See Comments)   Duloxetine Other (See Comments)   Passing out   Paroxetine Hcl Other (See Comments)   Hypotension   Zaleplon Other (See Comments)      Medication List       Accurate as of 12/19/16 11:16 AM. Always use your most recent med list.          acetaminophen 500 MG tablet Commonly known as:  TYLENOL Take 500 mg by mouth every 6 (six) hours as needed.   alum & mag hydroxide-simeth 703-500-93 MG/5ML suspension Commonly known as:  MAALOX PLUS Take 30 mLs by mouth every 4 (four) hours as needed for indigestion.   benzonatate 200 MG capsule Commonly known as:  TESSALON Take 200 mg by mouth every 8 (eight) hours as needed for cough.   bisacodyl 10 MG suppository Commonly known as:  DULCOLAX Place 10 mg rectally daily as needed.   COMBIGAN 0.2-0.5 % ophthalmic solution Generic drug:  brimonidine-timolol Place 1 drop into both eyes 2 (two) times daily. [place] for patient to use his own bottle of eye drops   DERMACLOUD Crea Apply liberal amount topically to area of skin irritation as needed. Ok to leave at bedside.   ENSURE Take 1 Can by mouth 2 (two) times daily between meals. ONLY GIVE IF HE IS NOT EATING. Family will provide the ensure.   GERI-TUSSIN DM 10-100 MG/5ML liquid Generic drug:  Dextromethorphan-Guaifenesin Take 5-10 mLs by mouth every 4 (four) hours as needed.   ipratropium-albuterol 0.5-2.5 (3) MG/3ML Soln Commonly known as:  DUONEB Take 3 mLs by nebulization 4 (four) times daily as needed.   ipratropium-albuterol 0.5-2.5 (3) MG/3ML Soln Commonly known as:  DUONEB Take 3 mLs by nebulization 3 (three) times daily.   levothyroxine 75 MCG tablet Commonly known as:  SYNTHROID, LEVOTHROID Take 75 mcg by mouth daily before breakfast.   LORazepam 0.5 MG tablet Commonly known as:  ATIVAN Take 0.5 mg by mouth every 4 (four) hours as needed.   LUMIGAN 0.01 % Soln Generic drug:  bimatoprost Place 1  drop into both eyes at bedtime.   magnesium hydroxide 400 MG/5ML suspension Commonly known as:  MILK OF MAGNESIA Take 30 mLs by mouth every 4 (four) hours as needed for mild constipation. If constipation / no BM for 2 days   midodrine 5 MG tablet Commonly known as:  PROAMATINE Take 5 mg by mouth 3 (three) times daily with meals.   mirtazapine 15 MG tablet Commonly known as:  REMERON Take 15 mg by mouth at bedtime.   morphine 20 MG/ML concentrated solution Commonly known as:  ROXANOL Give 0.25 ml -0.5 ml by mouth every hour as needed for mild to severe pain. 0.25 = mild pain 0.5 = severe pain   omeprazole 20 MG capsule Commonly known as:  PRILOSEC Take 20 mg by mouth daily before breakfast.   OXYGEN Inhale  2-5 L into the lungs continuous. Titrate to maintain sats > 90 % and/or for comfort   polyethylene glycol powder powder Commonly known as:  GLYCOLAX/MIRALAX Take 17 g by mouth daily as needed.   SENNA-PLUS 8.6-50 MG tablet Generic drug:  senna-docusate Take 1-2 tablets by mouth 2 (two) times daily as needed.   sertraline 100 MG tablet Commonly known as:  ZOLOFT Take 100 mg by mouth daily.       Review of Systems  Constitutional: Negative for activity change, appetite change, chills, diaphoresis and fever.  HENT: Negative.  Negative for congestion, sneezing, sore throat, trouble swallowing and voice change.   Respiratory: Negative.  Negative for apnea, cough, choking, chest tightness, shortness of breath and wheezing.   Cardiovascular: Negative.  Negative for chest pain and palpitations.  Gastrointestinal: Negative.  Negative for abdominal distention, abdominal pain, constipation, diarrhea and nausea.  Genitourinary: Negative.  Negative for dysuria, frequency and urgency. Difficulty urinating: foley in place.  Musculoskeletal: Negative.  Negative for back pain, gait problem and myalgias. Arthralgias: typical arthritis.  Skin: Positive for pallor. Negative for color  change, rash and wound.  Neurological: Negative.  Negative for dizziness, tremors, syncope, speech difficulty, weakness, numbness and headaches.  Hematological: Negative.   Psychiatric/Behavioral: Negative.  Negative for agitation and behavioral problems.  All other systems reviewed and are negative.   Immunization History  Administered Date(s) Administered  . Influenza Split 02/19/2015  . Influenza-Unspecified 02/10/2016  . PPD Test 08/08/2016   Pertinent  Health Maintenance Due  Topic Date Due  . PNA vac Low Risk Adult (1 of 2 - PCV13) 06/13/1992  . INFLUENZA VACCINE  12/20/2016   Fall Risk  02/23/2015  Falls in the past year? No  Risk for fall due to : History of fall(s);Impaired balance/gait   Functional Status Survey:    Vitals:   12/19/16 1022  BP: (!) 74/49  Pulse: (!) 59  Resp: 16  Temp: (!) 97.5 F (36.4 C)  SpO2: 99%  Weight: 177 lb 14.4 oz (80.7 kg)  Height: 6\' 1"  (1.854 m)   Body mass index is 23.47 kg/m. Physical Exam  Constitutional: He is oriented to person, place, and time. Vital signs are normal. He appears well-developed. He appears cachectic. He is active and cooperative. He does not appear ill. No distress. Nasal cannula in place.  HENT:  Head: Normocephalic and atraumatic.  Mouth/Throat: Uvula is midline, oropharynx is clear and moist and mucous membranes are normal. Mucous membranes are not pale, not dry and not cyanotic.  Eyes: Pupils are equal, round, and reactive to light. Conjunctivae, EOM and lids are normal.  Neck: Trachea normal, normal range of motion and full passive range of motion without pain. Neck supple. No JVD present. No tracheal deviation, no edema and no erythema present. No thyromegaly present.  Cardiovascular: Normal rate, regular rhythm, intact distal pulses and normal pulses.  Exam reveals no gallop, no distant heart sounds and no friction rub.   No murmur heard. Pulses:      Dorsalis pedis pulses are 2+ on the right side, and  2+ on the left side.  3-4+ Pitting edema-BLE  Pulmonary/Chest: Breath sounds normal. No accessory muscle usage. No respiratory distress. He has no decreased breath sounds. He has no wheezes. He has no rhonchi. He has no rales. He exhibits no tenderness.  Abdominal: Normal appearance. He exhibits no distension and no ascites. Bowel sounds are decreased. There is no tenderness.  Genitourinary: Right testis shows swelling. Left testis shows  swelling.  Genitourinary Comments: Foley in place  Musculoskeletal: Normal range of motion. He exhibits no edema or tenderness.  Expected osteoarthritis, stiffness  Neurological: He is alert and oriented to person, place, and time. He has normal strength.  Skin: Skin is warm, dry and intact. No rash noted. He is not diaphoretic. No cyanosis or erythema. No pallor. Nails show no clubbing.  Psychiatric: He has a normal mood and affect. His speech is normal and behavior is normal. Judgment and thought content normal. Cognition and memory are normal.  Nursing note and vitals reviewed.   Labs reviewed: No results for input(s): NA, K, CL, CO2, GLUCOSE, BUN, CREATININE, CALCIUM, MG, PHOS in the last 8760 hours. No results for input(s): AST, ALT, ALKPHOS, BILITOT, PROT, ALBUMIN in the last 8760 hours. No results for input(s): WBC, NEUTROABS, HGB, HCT, MCV, PLT in the last 8760 hours. Lab Results  Component Value Date   TSH 3.385 12/30/2014   Lab Results  Component Value Date   HGBA1C 5.7 (H) 12/25/2014   No results found for: CHOL, HDL, LDLCALC, LDLDIRECT, TRIG, CHOLHDL  Significant Diagnostic Results in last 30 days:  No results found.  Assessment/Plan 1. Parkinson disease ([place])  Stable  Continue Hospice services for EOL Care  2. Paroxysmal atrial fibrillation ([facility])  Stable   3. Hypo-osmolality and hyponatremia  Stable   4. Retention of urine, unspecified  Stable  Continue foley catheter  Continue Renacidin (citric ac-gluconolact-mag  carb) solution; 6.602-3.268 gram/100 mL; amt: 30 mL; irrigation- Instill 30 mL solution into foley catheter tubing. Clamp tubing for 30 minutes then unclamp and allow to flow.  5. Gastroesophageal reflux disease without esophagitis  Stable  Continue Prilosec OTC (omeprazole magnesium) [OTC] tablet,delayed release (DR/EC); 20 mg PO Q Day  6. Hypothyroidism, unspecified type  Stable  Continue levothyroxine tablet; 75 mcg once daily  7. Glaucoma of both eyes, unspecified glaucoma type  Stable  Continue Combigan (brimonidine-timolol) drops; 0.2-0.5 %; amt: 1 gtt both eyes BID  Continue Lumigan (bimatoprost) drops; 0.01 % 1 drop both eyes Q HS  8. Atherosclerosis of native coronary artery of native heart with angina pectoris (HCC)  Stable   9. Personal history of malignant neoplasm of prostate  Stable   10. Hypotension, unspecified hypotension type  Stable  Continue midodrine tablet; 5 mg 1 tablet TID  11. Muscle weakness (generalized)  Stable   12. Major depressive disorder with single episode, remission status unspecified  Stable  Continue sertraline tablet; 100 mg; amt: 1 tablet Q Day  13. Anxiety disorder, unspecified type  Stable  Continue lorazepam - Schedule IV tablet; 0.5 mg; amt: 1 tablet Q 4 hours prn  14. Poor appetite  Stable  Continue Ensure (food supplemt, lactose-reduced) [OTC] liquid; amt: 1 bottle/can BID PRN if pt doesn't eat meal  Continue mirtazapine tablet; 15 mg; amt: 1 tablet Q HS  15. Dyspnea, unspecified type  Stable  Continue Benzonatate capsule; 200 mg Q 8 hours prn  Continue ipratropium-albuterol solution for nebulization; 0.5 mg-3 mg(2.5 mg base)/3 mL QID PRN  Continue ipratropium-albuterol solution for nebulization; 0.5 mg-3 mg(2.5 mg base)/3 mL; amt: 3 mL TID scheduled  Continue morphine - Schedule II solution; 20 mg/1 mL; amt: 0.25-0.5 mL Q 1 hour prn  Continue O2 2 L [place]   Family/ staff Communication:  Total  Time:  Documentation:  Face to Face:  Family/Phone:   Labs/tests ordered:    Medication list reviewed and assessed for continued appropriateness. Monthly medication orders reviewed and signed.  Vikki Ports, NP-C Geriatrics [facility] [phone], [place] [address] Cell Phone (Mon-Fri 8am-5pm):  [phone] On Call:  [phone] & follow prompts after 5pm & weekends Office Phone:  [phone] Office Fax:  [phone]

## 2016-12-20 ENCOUNTER — Encounter
Admission: RE | Admit: 2016-12-20 | Discharge: 2016-12-20 | Disposition: A | Discharge status: Home / Self Care | Payer: PPO | Source: Ambulatory Visit | Attending: Internal Medicine | Admitting: Internal Medicine

## 2016-12-27 DIAGNOSIS — D0422 Carcinoma in situ of skin of left ear and external auricular canal: Secondary | ICD-10-CM | POA: Diagnosis not present

## 2016-12-27 DIAGNOSIS — Z85828 Personal history of other malignant neoplasm of skin: Secondary | ICD-10-CM | POA: Diagnosis not present

## 2016-12-27 DIAGNOSIS — L821 Other seborrheic keratosis: Secondary | ICD-10-CM | POA: Diagnosis not present

## 2016-12-27 DIAGNOSIS — L570 Actinic keratosis: Secondary | ICD-10-CM | POA: Diagnosis not present

## 2016-12-27 DIAGNOSIS — L578 Other skin changes due to chronic exposure to nonionizing radiation: Secondary | ICD-10-CM | POA: Diagnosis not present

## 2016-12-27 DIAGNOSIS — D1800 Hemangioma unspecified site: Secondary | ICD-10-CM | POA: Diagnosis not present

## 2016-12-27 DIAGNOSIS — D692 Other nonthrombocytopenic purpura: Secondary | ICD-10-CM | POA: Diagnosis not present

## 2017-01-01 DIAGNOSIS — Z8546 Personal history of malignant neoplasm of prostate: Secondary | ICD-10-CM | POA: Insufficient documentation

## 2017-01-01 DIAGNOSIS — I480 Paroxysmal atrial fibrillation: Secondary | ICD-10-CM | POA: Insufficient documentation

## 2017-01-01 DIAGNOSIS — F419 Anxiety disorder, unspecified: Secondary | ICD-10-CM | POA: Insufficient documentation

## 2017-01-01 DIAGNOSIS — R0600 Dyspnea, unspecified: Secondary | ICD-10-CM | POA: Insufficient documentation

## 2017-01-01 DIAGNOSIS — F329 Major depressive disorder, single episode, unspecified: Secondary | ICD-10-CM | POA: Insufficient documentation

## 2017-01-01 DIAGNOSIS — R339 Retention of urine, unspecified: Secondary | ICD-10-CM | POA: Insufficient documentation

## 2017-01-01 DIAGNOSIS — E871 Hypo-osmolality and hyponatremia: Secondary | ICD-10-CM | POA: Insufficient documentation

## 2017-01-01 DIAGNOSIS — I959 Hypotension, unspecified: Secondary | ICD-10-CM | POA: Insufficient documentation

## 2017-01-01 DIAGNOSIS — E039 Hypothyroidism, unspecified: Secondary | ICD-10-CM | POA: Insufficient documentation

## 2017-01-01 DIAGNOSIS — M6281 Muscle weakness (generalized): Secondary | ICD-10-CM | POA: Insufficient documentation

## 2017-01-01 DIAGNOSIS — H409 Unspecified glaucoma: Secondary | ICD-10-CM | POA: Insufficient documentation

## 2017-01-19 ENCOUNTER — Encounter: Payer: Self-pay | Admitting: Gerontology

## 2017-01-19 ENCOUNTER — Non-Acute Institutional Stay (SKILLED_NURSING_FACILITY): Payer: Medicare Other | Admitting: Gerontology

## 2017-01-19 DIAGNOSIS — Z8546 Personal history of malignant neoplasm of prostate: Secondary | ICD-10-CM | POA: Diagnosis not present

## 2017-01-19 DIAGNOSIS — I959 Hypotension, unspecified: Secondary | ICD-10-CM

## 2017-01-19 DIAGNOSIS — G20A1 Parkinson's disease without dyskinesia, without mention of fluctuations: Secondary | ICD-10-CM

## 2017-01-19 DIAGNOSIS — R339 Retention of urine, unspecified: Secondary | ICD-10-CM | POA: Diagnosis not present

## 2017-01-19 DIAGNOSIS — E039 Hypothyroidism, unspecified: Secondary | ICD-10-CM | POA: Diagnosis not present

## 2017-01-19 DIAGNOSIS — H409 Unspecified glaucoma: Secondary | ICD-10-CM | POA: Diagnosis not present

## 2017-01-19 DIAGNOSIS — R0600 Dyspnea, unspecified: Secondary | ICD-10-CM

## 2017-01-19 DIAGNOSIS — K219 Gastro-esophageal reflux disease without esophagitis: Secondary | ICD-10-CM

## 2017-01-19 DIAGNOSIS — I480 Paroxysmal atrial fibrillation: Secondary | ICD-10-CM

## 2017-01-19 DIAGNOSIS — F329 Major depressive disorder, single episode, unspecified: Secondary | ICD-10-CM | POA: Diagnosis not present

## 2017-01-19 DIAGNOSIS — G20 Parkinson's disease: Secondary | ICD-10-CM

## 2017-01-19 DIAGNOSIS — M6281 Muscle weakness (generalized): Secondary | ICD-10-CM | POA: Diagnosis not present

## 2017-01-19 DIAGNOSIS — I209 Angina pectoris, unspecified: Secondary | ICD-10-CM

## 2017-01-19 DIAGNOSIS — I25119 Atherosclerotic heart disease of native coronary artery with unspecified angina pectoris: Secondary | ICD-10-CM

## 2017-01-19 DIAGNOSIS — F419 Anxiety disorder, unspecified: Secondary | ICD-10-CM

## 2017-01-19 DIAGNOSIS — R630 Anorexia: Secondary | ICD-10-CM

## 2017-01-19 DIAGNOSIS — E871 Hypo-osmolality and hyponatremia: Secondary | ICD-10-CM | POA: Diagnosis not present

## 2017-01-19 NOTE — Progress Notes (Signed)
Location:   [facility] Room Number: 559R Place of Service:  SNF [phone]) Provider:  Toni Arthurs, NP-C  Rusty Aus, MD  Patient Care Team: Rusty Aus, MD as PCP - General (Internal Medicine) Ubaldo Glassing Javier Docker, MD as Consulting Physician (Cardiology) Minna Merritts, MD as Consulting Physician (Cardiology)  Extended Emergency Contact Information Primary Emergency Contact: [name] Address: [address]          [place], [place] [address] [name] of [place] Phone: [phone] Mobile Phone: [phone] Relation: Spouse Secondary Emergency Contact: Long of [place] Phone: [phone] Relation: Daughter  Code Status:  DNR Goals of care: Advanced Directive information Advanced Directives 01/19/2017  Does Patient Have a Medical Advance Directive? Yes  Type of Advance Directive Out of facility DNR (pink MOST or yellow form)  Does patient want to make changes to medical advance directive? No - Patient declined  Copy of [place] in Chart? -  Would patient like information on creating a medical advance directive? -     Chief Complaint  Patient presents with  . Medical Management of Chronic Issues    Routine Visit    HPI:  Pt is a 83 y.o. male seen today for medical management of chronic diseases. Pt has been stable over the past month. He is on Hospice services for EOL care r/t parkinson's disease. He is bedbound, gets OOB with use of a Hoyer lift. Pt is pleasant, always smiling. Appetite is fair. Having regular BMs with occasional constipation. Denies cough, congestion at this time. On scheduled Duonebs for ease of administration d/t weakness. No falls this month. Denies pain. Intermittent dyspnea. Pt denies n/v/d/f/c/cp/ha/abd pain/dizziness. Pt continues with persistent hypotension but is asymptomatic. VSS. No other complaints.    Past Medical History:  Diagnosis Date  . Abnormal  stress test   . Acquired hypothyroidism    unspecified  . AF (paroxysmal atrial fibrillation) ([place])   . Anxiety    just started med. for anxiety   . Cancer [facility])    prostate  . Colon cancer ([place]) 11/16/2013   partial colectomy  . Coronary artery disease   . Depression    unspecified  . GERD (gastroesophageal reflux disease)   . Glaucoma   . Hearing loss   . History of hiatal hernia   . Hyperlipidemia 11/16/2013  . Hypertension   . Pernicious anemia   . Prostate cancer ([place]) 11/16/2013   1/8 bx pos, waiting  . Reflux   . Reflux esophagitis 11/16/2013   Past Surgical History:  Procedure Laterality Date  . CARDIAC CATHETERIZATION N/A 12/10/2014   Procedure: Left Heart Cath and Coronary Angiography;  Surgeon: Teodoro Spray, MD;  Location: [facility];  Service: Cardiovascular;  Laterality: N/A;  . CATARACT EXTRACTION    . COLON SURGERY    . COLONOSCOPY    . CORONARY ARTERY BYPASS GRAFT N/A 12/28/2014   Procedure: CORONARY ARTERY BYPASS GRAFTING (CABG), ON PUMP, TIMES SIX, USING LEFT INTERNAL MAMMARY ARTERY, RIGHT GREATER SAPHENOUS VEIN HARVESTED ENDOSCOPICALLY;  Surgeon: [name], MD;  Location: [facility];  Service: Open Heart Surgery;  Laterality: N/A;  -SEQ LIMA to MID LAD and DISTAL LAD -SVG to DIAGONAL -SEQ SVG to OM1 and OM2 -SVG to PDA  . EYE SURGERY     catarcats removed bilataeral /w IOL  . FOOT SURGERY Right 2009   Shorten metatarsal  . HERNIA REPAIR Bilateral 1991  inguinal   . PARTIAL COLECTOMY     WITH ANANSTOMSIS  . TEE WITHOUT CARDIOVERSION N/A 12/28/2014   Procedure: TRANSESOPHAGEAL ECHOCARDIOGRAM (TEE);  Surgeon: [name], MD;  Location: [facility];  Service: Open Heart Surgery;  Laterality: N/A;  . TONSILLECTOMY      Allergies  Allergen Reactions  . Carvedilol Other (See Comments)  . Duloxetine Other (See Comments)    Passing out  . Paroxetine Hcl Other (See Comments)    Hypotension  . Zaleplon Other (See Comments)    Allergies  as of 01/19/2017      Reactions   Carvedilol Other (See Comments)   Duloxetine Other (See Comments)   Passing out   Paroxetine Hcl Other (See Comments)   Hypotension   Zaleplon Other (See Comments)      Medication List       Accurate as of 01/19/17 12:53 PM. Always use your most recent med list.          acetaminophen 500 MG tablet Commonly known as:  TYLENOL Take 500 mg by mouth every 6 (six) hours as needed.   alum & mag hydroxide-simeth 841-324-40 MG/5ML suspension Commonly known as:  MAALOX PLUS Take 30 mLs by mouth every 4 (four) hours as needed for indigestion.   benzonatate 200 MG capsule Commonly known as:  TESSALON Take 200 mg by mouth every 8 (eight) hours as needed for cough.   bisacodyl 10 MG suppository Commonly known as:  DULCOLAX Place 10 mg rectally daily as needed.   COMBIGAN 0.2-0.5 % ophthalmic solution Generic drug:  brimonidine-timolol Place 1 drop into both eyes 2 (two) times daily. Babbie for patient to use his own bottle of eye drops   DERMACLOUD Crea Apply liberal amount topically to area of skin irritation as needed. Ok to leave at bedside.   ENSURE Take 1 Can by mouth 2 (two) times daily between meals. ONLY GIVE IF HE IS NOT EATING. Family will provide the ensure.   GERI-TUSSIN DM 10-100 MG/5ML liquid Generic drug:  Dextromethorphan-Guaifenesin Take 5-10 mLs by mouth every 4 (four) hours as needed.   ipratropium-albuterol 0.5-2.5 (3) MG/3ML Soln Commonly known as:  DUONEB Take 3 mLs by nebulization 4 (four) times daily as needed.   ipratropium-albuterol 0.5-2.5 (3) MG/3ML Soln Commonly known as:  DUONEB Take 3 mLs by nebulization 3 (three) times daily.   levothyroxine 75 MCG tablet Commonly known as:  SYNTHROID, LEVOTHROID Take 75 mcg by mouth daily before breakfast.   LORazepam 0.5 MG tablet Commonly known as:  ATIVAN Take 0.5 mg by mouth every 4 (four) hours as needed.   LUMIGAN 0.01 % Soln Generic drug:  bimatoprost Place 1  drop into both eyes at bedtime.   magnesium hydroxide 400 MG/5ML suspension Commonly known as:  MILK OF MAGNESIA Take 30 mLs by mouth every 4 (four) hours as needed for mild constipation. If constipation / no BM for 2 days   midodrine 5 MG tablet Commonly known as:  PROAMATINE Take 5 mg by mouth 3 (three) times daily with meals.   mirtazapine 15 MG tablet Commonly known as:  REMERON Take 15 mg by mouth at bedtime.   morphine 20 MG/ML concentrated solution Commonly known as:  ROXANOL Give 0.25 ml -0.5 ml by mouth every hour as needed for mild to severe pain. 0.25 = mild pain 0.5 = severe pain   omeprazole 20 MG capsule Commonly known as:  PRILOSEC Take 20 mg by mouth daily before breakfast.   OXYGEN Inhale  2-5 L into the lungs continuous. Titrate to maintain sats > 90 % and/or for comfort   polyethylene glycol powder powder Commonly known as:  GLYCOLAX/MIRALAX Take 17 g by mouth daily as needed.   SENNA-PLUS 8.6-50 MG tablet Generic drug:  senna-docusate Take 1-2 tablets by mouth 2 (two) times daily as needed.   sertraline 100 MG tablet Commonly known as:  ZOLOFT Take 100 mg by mouth daily.   sorbitol 70 % solution Take 30 mLs by mouth daily as needed. Give every 2 hours until large BM, then change med to as needed       Review of Systems  Constitutional: Negative for activity change, appetite change, chills, diaphoresis and fever.  HENT: Negative.  Negative for congestion, sneezing, sore throat, trouble swallowing and voice change.   Respiratory: Negative.  Negative for apnea, cough, choking, chest tightness, shortness of breath and wheezing.   Cardiovascular: Negative.  Negative for chest pain and palpitations.  Gastrointestinal: Negative.  Negative for abdominal distention, abdominal pain, constipation, diarrhea and nausea.  Genitourinary: Negative.  Negative for dysuria, frequency and urgency. Difficulty urinating: foley in place.  Musculoskeletal: Negative.   Negative for back pain, gait problem and myalgias. Arthralgias: typical arthritis.  Skin: Positive for pallor. Negative for color change, rash and wound.  Neurological: Negative.  Negative for dizziness, tremors, syncope, speech difficulty, weakness, numbness and headaches.  Hematological: Negative.   Psychiatric/Behavioral: Negative.  Negative for agitation and behavioral problems.  All other systems reviewed and are negative.   Immunization History  Administered Date(s) Administered  . Influenza Split 02/19/2015  . Influenza-Unspecified 02/10/2016  . PPD Test 08/08/2016   Pertinent  Health Maintenance Due  Topic Date Due  . PNA vac Low Risk Adult (1 of 2 - PCV13) 06/13/1992  . INFLUENZA VACCINE  12/20/2016   Fall Risk  02/23/2015  Falls in the past year? No  Risk for fall due to : History of fall(s);Impaired balance/gait   Functional Status Survey:    Vitals:   01/19/17 1237  BP: 106/73  Pulse: 75  Resp: 20  Temp: 98.4 F (36.9 C)  SpO2: 97%  Weight: 173 lb (78.5 kg)  Height: 6\' 1"  (1.854 m)   Body mass index is 22.82 kg/m. Physical Exam  Constitutional: He is oriented to person, place, and time. Vital signs are normal. He appears well-developed. He appears cachectic. He is active and cooperative. He does not appear ill. No distress. Nasal cannula in place.  HENT:  Head: Normocephalic and atraumatic.  Mouth/Throat: Uvula is midline, oropharynx is clear and moist and mucous membranes are normal. Mucous membranes are not pale, not dry and not cyanotic.  Eyes: Pupils are equal, round, and reactive to light. Conjunctivae, EOM and lids are normal.  Neck: Trachea normal, normal range of motion and full passive range of motion without pain. Neck supple. No JVD present. No tracheal deviation, no edema and no erythema present. No thyromegaly present.  Cardiovascular: Normal rate, regular rhythm, intact distal pulses and normal pulses.  Exam reveals no gallop, no distant heart  sounds and no friction rub.   No murmur heard. Pulses:      Dorsalis pedis pulses are 2+ on the right side, and 2+ on the left side.  3-4+ Pitting edema-BLE  Pulmonary/Chest: Breath sounds normal. No accessory muscle usage. No respiratory distress. He has no decreased breath sounds. He has no wheezes. He has no rhonchi. He has no rales. He exhibits no tenderness.  Abdominal: Soft. Normal appearance and  bowel sounds are normal. He exhibits no distension and no ascites. There is no tenderness.  Genitourinary:  Genitourinary Comments: Foley in place  Musculoskeletal: Normal range of motion. He exhibits no edema or tenderness.  Expected osteoarthritis, stiffness  Neurological: He is alert and oriented to person, place, and time. He has normal strength.  Skin: Skin is warm, dry and intact. No rash noted. He is not diaphoretic. No cyanosis or erythema. No pallor. Nails show no clubbing.  Psychiatric: He has a normal mood and affect. His speech is normal and behavior is normal. Judgment and thought content normal. Cognition and memory are normal.  Nursing note and vitals reviewed.   Labs reviewed: No results for input(s): NA, K, CL, CO2, GLUCOSE, BUN, CREATININE, CALCIUM, MG, PHOS in the last 8760 hours. No results for input(s): AST, ALT, ALKPHOS, BILITOT, PROT, ALBUMIN in the last 8760 hours. No results for input(s): WBC, NEUTROABS, HGB, HCT, MCV, PLT in the last 8760 hours. Lab Results  Component Value Date   TSH 3.385 12/30/2014   Lab Results  Component Value Date   HGBA1C 5.7 (H) 12/25/2014   No results found for: CHOL, HDL, LDLCALC, LDLDIRECT, TRIG, CHOLHDL  Significant Diagnostic Results in last 30 days:  No results found.  Assessment/Plan 1. Parkinson disease ([place])  Stable  Continue Hospice services for EOL Care  2. Paroxysmal atrial fibrillation (HCC)  Stable   3. Hypo-osmolality and hyponatremia  Stable   4. Retention of urine, unspecified  Stable  Continue  foley catheter  Continue Renacidin (citric ac-gluconolact-mag carb) solution; 6.602-3.268 gram/100 mL; amt: 30 mL; irrigation- Instill 30 mL solution into foley catheter tubing. Clamp tubing for 30 minutes then unclamp and allow to flow.  5. Gastroesophageal reflux disease without esophagitis  Stable  Continue Prilosec OTC (omeprazole magnesium) [OTC] tablet,delayed release (DR/EC); 20 mg PO Q Day  6. Hypothyroidism, unspecified type  Stable  Continue levothyroxine tablet; 75 mcg once daily  7. Glaucoma of both eyes, unspecified glaucoma type  Stable  Continue Combigan (brimonidine-timolol) drops; 0.2-0.5 %; amt: 1 gtt both eyes BID  Continue Lumigan (bimatoprost) drops; 0.01 % 1 drop both eyes Q HS  8. Atherosclerosis of native coronary artery of native heart with angina pectoris (HCC)  Stable   9. Personal history of malignant neoplasm of prostate  Stable   10. Hypotension, unspecified hypotension type  Stable  Continue midodrine tablet; 5 mg 1 tablet TID  11. Muscle weakness (generalized)  Stable   12. Major depressive disorder with single episode, remission status unspecified  Stable  Continue sertraline tablet; 100 mg; amt: 1 tablet Q Day  13. Anxiety disorder, unspecified type  Stable  Continue lorazepam - Schedule IV tablet; 0.5 mg; amt: 1 tablet Q 4 hours prn  14. Poor appetite  Stable  Continue Ensure (food supplemt, lactose-reduced) [OTC] liquid; amt: 1 bottle/can BID PRN if pt doesn't eat meal  Continue mirtazapine tablet; 15 mg; amt: 1 tablet Q HS  15. Dyspnea, unspecified type  Stable  Continue Benzonatate capsule; 200 mg Q 8 hours prn  Continue ipratropium-albuterol solution for nebulization; 0.5 mg-3 mg(2.5 mg base)/3 mL QID PRN  Continue ipratropium-albuterol solution for nebulization; 0.5 mg-3 mg(2.5 mg base)/3 mL; amt: 3 mL TID scheduled  Continue morphine - Schedule II solution; 20 mg/1 mL; amt: 0.25-0.5 mL Q 1 hour  prn  Continue O2 2 L [place]   Family/ staff Communication:  Total Time:  Documentation:  Face to Face:  Family/Phone:   Labs/tests ordered:  Medication list reviewed and assessed for continued appropriateness. Monthly medication orders reviewed and signed.  Vikki Ports, NP-C Geriatrics [facility] [phone] [place], [place] [address] Cell Phone (Mon-Fri 8am-5pm):  [phone] On Call:  [phone] & follow prompts after 5pm & weekends Office Phone:  [phone] Office Fax:  [phone]

## 2017-01-20 ENCOUNTER — Encounter
Admission: RE | Admit: 2017-01-20 | Discharge: 2017-01-20 | Disposition: A | Discharge status: Home / Self Care | Payer: PPO | Source: Ambulatory Visit | Attending: Internal Medicine | Admitting: Internal Medicine

## 2017-02-16 ENCOUNTER — Non-Acute Institutional Stay (SKILLED_NURSING_FACILITY): Payer: Medicare Other | Admitting: Gerontology

## 2017-02-16 ENCOUNTER — Encounter: Payer: Self-pay | Admitting: Gerontology

## 2017-02-16 DIAGNOSIS — F329 Major depressive disorder, single episode, unspecified: Secondary | ICD-10-CM | POA: Diagnosis not present

## 2017-02-16 DIAGNOSIS — H409 Unspecified glaucoma: Secondary | ICD-10-CM

## 2017-02-16 DIAGNOSIS — G20 Parkinson's disease: Secondary | ICD-10-CM

## 2017-02-16 DIAGNOSIS — E039 Hypothyroidism, unspecified: Secondary | ICD-10-CM | POA: Diagnosis not present

## 2017-02-16 DIAGNOSIS — I959 Hypotension, unspecified: Secondary | ICD-10-CM | POA: Diagnosis not present

## 2017-02-16 DIAGNOSIS — R339 Retention of urine, unspecified: Secondary | ICD-10-CM

## 2017-02-16 DIAGNOSIS — I25119 Atherosclerotic heart disease of native coronary artery with unspecified angina pectoris: Secondary | ICD-10-CM | POA: Diagnosis not present

## 2017-02-16 DIAGNOSIS — R630 Anorexia: Secondary | ICD-10-CM

## 2017-02-16 DIAGNOSIS — I480 Paroxysmal atrial fibrillation: Secondary | ICD-10-CM

## 2017-02-16 DIAGNOSIS — K219 Gastro-esophageal reflux disease without esophagitis: Secondary | ICD-10-CM

## 2017-02-16 DIAGNOSIS — Z8546 Personal history of malignant neoplasm of prostate: Secondary | ICD-10-CM | POA: Diagnosis not present

## 2017-02-16 DIAGNOSIS — F419 Anxiety disorder, unspecified: Secondary | ICD-10-CM

## 2017-02-16 DIAGNOSIS — I209 Angina pectoris, unspecified: Secondary | ICD-10-CM

## 2017-02-16 DIAGNOSIS — E871 Hypo-osmolality and hyponatremia: Secondary | ICD-10-CM | POA: Diagnosis not present

## 2017-02-16 DIAGNOSIS — R0600 Dyspnea, unspecified: Secondary | ICD-10-CM

## 2017-02-16 DIAGNOSIS — M6281 Muscle weakness (generalized): Secondary | ICD-10-CM

## 2017-02-16 NOTE — Progress Notes (Signed)
Location:   [facility] Room Number: 237S Place of Service:  SNF [phone]) Provider:  Toni Arthurs, NP-C  Rusty Aus, MD  Patient Care Team: Rusty Aus, MD as PCP - General (Internal Medicine) Ubaldo Glassing Javier Docker, MD as Consulting Physician (Cardiology) Minna Merritts, MD as Consulting Physician (Cardiology)  Extended Emergency Contact Information Primary Emergency Contact: [name] Address: [address]          [place], [place] [address] [name] of [place] Phone: [phone] Mobile Phone: [phone] Relation: Spouse Secondary Emergency Contact: [place] of [place] Phone: [phone] Relation: Daughter  Code Status:  DNR Goals of care: Advanced Directive information Advanced Directives 02/16/2017  Does Patient Have a Medical Advance Directive? Yes  Type of Advance Directive Out of facility DNR (pink MOST or yellow form)  Does patient want to make changes to medical advance directive? No - Patient declined  Copy of [place] in Chart? -  Would patient like information on creating a medical advance directive? -     Chief Complaint  Patient presents with  . Medical Management of Chronic Issues    Routine Visit    HPI:  Pt is a 83 y.o. male seen today for medical management of chronic diseases. Pt has been stable over the past month. He is on Hospice services for EOL care r/t parkinson's disease. He is bedbound, gets OOB with use of a Hoyer lift. Pt is pleasant, always smiling. Appetite is fair. Having regular BMs with occasional constipation. Denies cough, congestion at this time. On scheduled Duonebs for ease of administration d/t weakness. No falls this month. Denies pain. Intermittent dyspnea. Pt denies n/v/d/f/c/cp/ha/abd pain/dizziness. Pt continues with persistent hypotension but is asymptomatic. Had one episode of severe anxiety this month. Given 1 time dose of Lorazepam with  optimal results. No c/o further symptoms. VSS. No other complaints.    Past Medical History:  Diagnosis Date  . Abnormal stress test   . Acquired hypothyroidism    unspecified  . AF (paroxysmal atrial fibrillation) ([place])   . Anxiety    just started med. for anxiety   . Cancer [facility])    prostate  . Colon cancer ([facility] [place]) 11/16/2013   partial colectomy  . Coronary artery disease   . Depression    unspecified  . GERD (gastroesophageal reflux disease)   . Glaucoma   . Hearing loss   . History of hiatal hernia   . Hyperlipidemia 11/16/2013  . Hypertension   . Pernicious anemia   . Prostate cancer ([place]) 11/16/2013   1/8 bx pos, waiting  . Reflux   . Reflux esophagitis 11/16/2013   Past Surgical History:  Procedure Laterality Date  . CARDIAC CATHETERIZATION N/A 12/10/2014   Procedure: Left Heart Cath and Coronary Angiography;  Surgeon: Teodoro Spray, MD;  Location: [place] [facility] LAB;  Service: Cardiovascular;  Laterality: N/A;  . CATARACT EXTRACTION    . COLON SURGERY    . COLONOSCOPY    . CORONARY ARTERY BYPASS GRAFT N/A 12/28/2014   Procedure: CORONARY ARTERY BYPASS GRAFTING (CABG), ON PUMP, TIMES SIX, USING LEFT INTERNAL MAMMARY ARTERY, RIGHT GREATER SAPHENOUS VEIN HARVESTED ENDOSCOPICALLY;  Surgeon: [name], MD;  Location: [place];  Service: Open Heart Surgery;  Laterality: N/A;  -SEQ LIMA to MID LAD and DISTAL LAD -SVG to DIAGONAL -SEQ SVG to OM1 and OM2 -SVG to PDA  . EYE SURGERY     catarcats removed  bilataeral /w IOL  . FOOT SURGERY Right 2009   Shorten metatarsal  . HERNIA REPAIR Bilateral 1991   inguinal   . PARTIAL COLECTOMY     WITH ANANSTOMSIS  . TEE WITHOUT CARDIOVERSION N/A 12/28/2014   Procedure: TRANSESOPHAGEAL ECHOCARDIOGRAM (TEE);  Surgeon: [name], MD;  Location: [facility];  Service: Open Heart Surgery;  Laterality: N/A;  . TONSILLECTOMY      Allergies  Allergen Reactions  . Carvedilol Other (See Comments)  . Duloxetine Other (See  Comments)    Passing out  . Paroxetine Hcl Other (See Comments)    Hypotension  . Zaleplon Other (See Comments)    Allergies as of 02/16/2017      Reactions   Carvedilol Other (See Comments)   Duloxetine Other (See Comments)   Passing out   Paroxetine Hcl Other (See Comments)   Hypotension   Zaleplon Other (See Comments)      Medication List       Accurate as of 02/16/17  2:02 PM. Always use your most recent med list.          acetaminophen 500 MG tablet Commonly known as:  TYLENOL Take 500 mg by mouth every 6 (six) hours as needed.   alum & mag hydroxide-simeth [phone] MG/5ML suspension Commonly known as:  MAALOX PLUS Take 30 mLs by mouth every 4 (four) hours as needed for indigestion.   benzonatate 200 MG capsule Commonly known as:  TESSALON Take 200 mg by mouth every 8 (eight) hours as needed for cough.   bisacodyl 10 MG suppository Commonly known as:  DULCOLAX Place 10 mg rectally daily as needed.   COMBIGAN 0.2-0.5 % ophthalmic solution Generic drug:  brimonidine-timolol Place 1 drop into both eyes 2 (two) times daily. [place] for patient to use his own bottle of eye drops   DERMACLOUD Crea Apply liberal amount topically to area of skin irritation as needed. Ok to leave at bedside.   ENSURE Take 1 Can by mouth 2 (two) times daily between meals. ONLY GIVE IF HE IS NOT EATING. Family will provide the ensure.   GERI-TUSSIN DM 10-100 MG/5ML liquid Generic drug:  Dextromethorphan-Guaifenesin Take 5-10 mLs by mouth every 4 (four) hours as needed.   gluconic acid-citric acid irrigation Irrigate with 30 mLs as directed 2 (two) times daily. Clamp tubing for 30 minutes then unclamp and allow to flow   ipratropium-albuterol 0.5-2.5 (3) MG/3ML Soln Commonly known as:  DUONEB Take 3 mLs by nebulization 4 (four) times daily as needed.   ipratropium-albuterol 0.5-2.5 (3) MG/3ML Soln Commonly known as:  DUONEB Take 3 mLs by nebulization 3 (three) times daily.     levothyroxine 75 MCG tablet Commonly known as:  SYNTHROID, LEVOTHROID Take 75 mcg by mouth daily before breakfast.   LORazepam 0.5 MG tablet Commonly known as:  ATIVAN Take 0.5 mg by mouth every 4 (four) hours as needed.   LUMIGAN 0.01 % Soln Generic drug:  bimatoprost Place 1 drop into both eyes at bedtime.   magnesium hydroxide 400 MG/5ML suspension Commonly known as:  MILK OF MAGNESIA Take 30 mLs by mouth every 4 (four) hours as needed for mild constipation. If constipation / no BM for 2 days   midodrine 5 MG tablet Commonly known as:  PROAMATINE Take 5 mg by mouth 3 (three) times daily with meals.   mirtazapine 15 MG tablet Commonly known as:  REMERON Take 15 mg by mouth at bedtime.   morphine 20 MG/ML concentrated solution Commonly known  as:  ROXANOL Give 0.25 ml -0.5 ml by mouth every hour as needed for mild to severe pain. 0.25 = mild pain 0.5 = severe pain   omeprazole 20 MG capsule Commonly known as:  PRILOSEC Take 20 mg by mouth daily before breakfast.   OXYGEN Inhale 2-5 L into the lungs continuous. Titrate to maintain sats > 90 % and/or for comfort   polyethylene glycol powder powder Commonly known as:  GLYCOLAX/MIRALAX Take 17 g by mouth daily as needed.   SENNA-PLUS 8.6-50 MG tablet Generic drug:  senna-docusate Take 1-2 tablets by mouth 2 (two) times daily as needed.   sertraline 100 MG tablet Commonly known as:  ZOLOFT Take 100 mg by mouth daily.   sorbitol 70 % solution Take 30 mLs by mouth daily as needed. Give every 2 hours until large BM, then change med to as needed       Review of Systems  Constitutional: Negative for activity change, appetite change, chills, diaphoresis and fever.  HENT: Negative.  Negative for congestion, sneezing, sore throat, trouble swallowing and voice change.   Respiratory: Negative.  Negative for apnea, cough, choking, chest tightness, shortness of breath and wheezing.   Cardiovascular: Negative.  Negative for  chest pain and palpitations.  Gastrointestinal: Negative.  Negative for abdominal distention, abdominal pain, constipation, diarrhea and nausea.  Genitourinary: Negative.  Negative for dysuria, frequency and urgency. Difficulty urinating: foley in place.  Musculoskeletal: Negative.  Negative for back pain, gait problem and myalgias. Arthralgias: typical arthritis.  Skin: Positive for pallor. Negative for color change, rash and wound.  Neurological: Negative.  Negative for dizziness, tremors, syncope, speech difficulty, weakness, numbness and headaches.  Hematological: Negative.   Psychiatric/Behavioral: Negative.  Negative for agitation and behavioral problems.  All other systems reviewed and are negative.   Immunization History  Administered Date(s) Administered  . Influenza Split 02/19/2015  . Influenza-Unspecified 02/10/2016  . PPD Test 08/08/2016   Pertinent  Health Maintenance Due  Topic Date Due  . PNA vac Low Risk Adult (1 of 2 - PCV13) 06/13/1992  . INFLUENZA VACCINE  12/20/2016   Fall Risk  02/23/2015  Falls in the past year? No  Risk for fall due to : History of fall(s);Impaired balance/gait   Functional Status Survey:    Vitals:   02/16/17 1351  BP: 117/73  Pulse: 73  Resp: 16  Temp: 98.1 F (36.7 C)  SpO2: 96%  Weight: 174 lb 1.6 oz (79 kg)  Height: 6\' 1"  (1.854 m)   Body mass index is 22.97 kg/m. Physical Exam  Constitutional: He is oriented to person, place, and time. Vital signs are normal. He appears well-developed. He appears cachectic. He is active and cooperative. He does not appear ill. No distress. Nasal cannula in place.  HENT:  Head: Normocephalic and atraumatic.  Mouth/Throat: Uvula is midline, oropharynx is clear and moist and mucous membranes are normal. Mucous membranes are not pale, not dry and not cyanotic.  Eyes: Pupils are equal, round, and reactive to light. Conjunctivae, EOM and lids are normal.  Neck: Trachea normal, normal range of  motion and full passive range of motion without pain. Neck supple. No JVD present. No tracheal deviation, no edema and no erythema present. No thyromegaly present.  Cardiovascular: Normal rate, regular rhythm, intact distal pulses and normal pulses.  Exam reveals no gallop, no distant heart sounds and no friction rub.   No murmur heard. Pulses:      Dorsalis pedis pulses are 2+ on the  right side, and 2+ on the left side.  3-4+ Pitting edema-BLE  Pulmonary/Chest: Breath sounds normal. No accessory muscle usage. No respiratory distress. He has no decreased breath sounds. He has no wheezes. He has no rhonchi. He has no rales. He exhibits no tenderness.  Abdominal: Soft. Normal appearance and bowel sounds are normal. He exhibits no distension and no ascites. There is no tenderness.  Genitourinary:  Genitourinary Comments: Foley in place  Musculoskeletal: Normal range of motion. He exhibits no edema or tenderness.  Expected osteoarthritis, stiffness  Neurological: He is alert and oriented to person, place, and time. He has normal strength.  Skin: Skin is warm, dry and intact. No rash noted. He is not diaphoretic. No cyanosis or erythema. No pallor. Nails show no clubbing.  Psychiatric: He has a normal mood and affect. His speech is normal and behavior is normal. Judgment and thought content normal. Cognition and memory are normal.  Nursing note and vitals reviewed.   Labs reviewed: No results for input(s): NA, K, CL, CO2, GLUCOSE, BUN, CREATININE, CALCIUM, MG, PHOS in the last 8760 hours. No results for input(s): AST, ALT, ALKPHOS, BILITOT, PROT, ALBUMIN in the last 8760 hours. No results for input(s): WBC, NEUTROABS, HGB, HCT, MCV, PLT in the last 8760 hours. Lab Results  Component Value Date   TSH 3.385 12/30/2014   Lab Results  Component Value Date   HGBA1C 5.7 (H) 12/25/2014   No results found for: CHOL, HDL, LDLCALC, LDLDIRECT, TRIG, CHOLHDL  Significant Diagnostic Results in last 30  days:  No results found.  Assessment/Plan 1. Parkinson disease ([place])  Stable  Continue Hospice services for EOL Care  2. Paroxysmal atrial fibrillation ([facility])  Stable   3. Hypo-osmolality and hyponatremia  Stable   4. Retention of urine, unspecified  Stable  Continue foley catheter  Continue Renacidin (citric ac-gluconolact-mag carb) solution; 6.602-3.268 gram/100 mL; amt: 30 mL; irrigation- Instill 30 mL solution into foley catheter tubing. Clamp tubing for 30 minutes then unclamp and allow to flow.  5. Gastroesophageal reflux disease without esophagitis  Stable  Continue Prilosec OTC (omeprazole magnesium) [OTC] tablet,delayed release (DR/EC); 20 mg PO Q Day  6. Hypothyroidism, unspecified type  Stable  Continue levothyroxine tablet; 75 mcg once daily  7. Glaucoma of both eyes, unspecified glaucoma type  Stable  Continue Combigan (brimonidine-timolol) drops; 0.2-0.5 %; amt: 1 gtt both eyes BID  Continue Lumigan (bimatoprost) drops; 0.01 % 1 drop both eyes Q HS  8. Atherosclerosis of native coronary artery of native heart with angina pectoris (HCC)  Stable   9. Personal history of malignant neoplasm of prostate  Stable   10. Hypotension, unspecified hypotension type  Stable  Continue midodrine tablet; 5 mg 1 tablet TID  11. Muscle weakness (generalized)  Stable   12. Major depressive disorder with single episode, remission status unspecified  Stable  Continue sertraline tablet; 100 mg; amt: 1 tablet Q Day  13. Anxiety disorder, unspecified type  Stable  Continue lorazepam - Schedule IV tablet; 0.5 mg; amt: 1 tablet Q 4 hours prn  14. Poor appetite  Stable  Continue Ensure (food supplemt, lactose-reduced) [OTC] liquid; amt: 1 bottle/can BID PRN if pt doesn't eat meal  Continue mirtazapine tablet; 15 mg; amt: 1 tablet Q HS  15. Dyspnea, unspecified type  Stable  Continue Benzonatate capsule; 200 mg Q 8 hours prn  Continue  ipratropium-albuterol solution for nebulization; 0.5 mg-3 mg(2.5 mg base)/3 mL QID PRN  Continue ipratropium-albuterol solution for nebulization; 0.5 mg-3 mg(2.5 mg  base)/3 mL; amt: 3 mL TID scheduled  Continue morphine - Schedule II solution; 20 mg/1 mL; amt: 0.25-0.5 mL Q 1 hour prn  Continue O2 2 L [place]   Family/ staff Communication:  Total Time:  Documentation:  Face to Face:  Family/Phone:   Labs/tests ordered:  Will check with family to see if they want labs checked  Medication list reviewed and assessed for continued appropriateness. Monthly medication orders reviewed and signed.  Vikki Ports, NP-C Geriatrics [facility] [phone], [place] [address] Cell Phone (Mon-Fri 8am-5pm):  [phone] On Call:  [phone] & follow prompts after 5pm & weekends Office Phone:  [phone] Office Fax:  [phone]

## 2017-02-19 ENCOUNTER — Encounter
Admission: RE | Admit: 2017-02-19 | Discharge: 2017-02-19 | Disposition: A | Discharge status: Home / Self Care | Payer: PPO | Source: Ambulatory Visit | Attending: Internal Medicine | Admitting: Internal Medicine

## 2017-02-19 ENCOUNTER — Non-Acute Institutional Stay (SKILLED_NURSING_FACILITY): Payer: Medicare Other | Admitting: Gerontology

## 2017-02-19 DIAGNOSIS — H1131 Conjunctival hemorrhage, right eye: Secondary | ICD-10-CM

## 2017-02-19 DIAGNOSIS — H04123 Dry eye syndrome of bilateral lacrimal glands: Secondary | ICD-10-CM | POA: Diagnosis not present

## 2017-02-20 ENCOUNTER — Encounter: Payer: Self-pay | Admitting: Gerontology

## 2017-02-20 DIAGNOSIS — I480 Paroxysmal atrial fibrillation: Secondary | ICD-10-CM | POA: Diagnosis not present

## 2017-02-20 DIAGNOSIS — Z0000 Encounter for general adult medical examination without abnormal findings: Secondary | ICD-10-CM | POA: Diagnosis not present

## 2017-02-20 DIAGNOSIS — I2510 Atherosclerotic heart disease of native coronary artery without angina pectoris: Secondary | ICD-10-CM | POA: Diagnosis not present

## 2017-02-20 DIAGNOSIS — I951 Orthostatic hypotension: Secondary | ICD-10-CM | POA: Diagnosis not present

## 2017-02-20 DIAGNOSIS — E039 Hypothyroidism, unspecified: Secondary | ICD-10-CM | POA: Diagnosis not present

## 2017-02-20 NOTE — Progress Notes (Signed)
Location:   [facility] Room Number: 202R Place of Service:  SNF [phone]) Provider:  Toni Arthurs, NP-C  Rusty Aus, MD  Patient Care Team: Rusty Aus, MD as PCP - General (Internal Medicine) Ubaldo Glassing Javier Docker, MD as Consulting Physician (Cardiology) Minna Merritts, MD as Consulting Physician (Cardiology)  Extended Emergency Contact Information Primary Emergency Contact: [name] Address: [address]          [place], [place] [address] [name] of [place] Phone: [phone] Mobile Phone: [phone] Relation: Spouse Secondary Emergency Contact: [place] of [place] Phone: [phone] Relation: Daughter  Code Status:  DNR Goals of care: Advanced Directive information Advanced Directives 02/20/2017  Does Patient Have a Medical Advance Directive? Yes  Type of Advance Directive Out of facility DNR (pink MOST or yellow form)  Does patient want to make changes to medical advance directive? No - Patient declined  Copy of [place] in Chart? -  Would patient like information on creating a medical advance directive? -     Chief Complaint  Patient presents with  . Acute Visit    Follow up on eye drainage    HPI:  Pt is a 83 y.o. male seen today for an acute visit for redness of the right eye with burning and drainage. Pt reports he woke up yesterday with the right eye having burning and some yellow drainage. Pt denies itching or pain. Conjunctiva pink, not injected. Sclera is red/ with hemorrhage. No other signs of trauma. Pt denies trauma or chemical irritation. Pt reports minimal discomfort. No changes in vision. Says he cleans his eyes with warm washcloth in the mornings. Otherwise, no other complaints.    Past Medical History:  Diagnosis Date  . Abnormal stress test   . Acquired hypothyroidism    unspecified  . AF (paroxysmal atrial fibrillation) ([place])   . Anxiety    just  started med. for anxiety   . Cancer [facility])    prostate  . Colon cancer ([place]) 11/16/2013   partial colectomy  . Coronary artery disease   . Depression    unspecified  . GERD (gastroesophageal reflux disease)   . Glaucoma   . Hearing loss   . History of hiatal hernia   . Hyperlipidemia 11/16/2013  . Hypertension   . Pernicious anemia   . Prostate cancer ([place]) 11/16/2013   1/8 bx pos, waiting  . Reflux   . Reflux esophagitis 11/16/2013   Past Surgical History:  Procedure Laterality Date  . CARDIAC CATHETERIZATION N/A 12/10/2014   Procedure: Left Heart Cath and Coronary Angiography;  Surgeon: Teodoro Spray, MD;  Location: [facility];  Service: Cardiovascular;  Laterality: N/A;  . CATARACT EXTRACTION    . COLON SURGERY    . COLONOSCOPY    . CORONARY ARTERY BYPASS GRAFT N/A 12/28/2014   Procedure: CORONARY ARTERY BYPASS GRAFTING (CABG), ON PUMP, TIMES SIX, USING LEFT INTERNAL MAMMARY ARTERY, RIGHT GREATER SAPHENOUS VEIN HARVESTED ENDOSCOPICALLY;  Surgeon: [name], MD;  Location: [facility];  Service: Open Heart Surgery;  Laterality: N/A;  -SEQ LIMA to MID LAD and DISTAL LAD -SVG to DIAGONAL -SEQ SVG to OM1 and OM2 -SVG to PDA  . EYE SURGERY     catarcats removed bilataeral /w IOL  . FOOT SURGERY Right 2009   Shorten metatarsal  . HERNIA REPAIR Bilateral 1991   inguinal   . PARTIAL COLECTOMY     WITH  ANANSTOMSIS  . TEE WITHOUT CARDIOVERSION N/A 12/28/2014   Procedure: TRANSESOPHAGEAL ECHOCARDIOGRAM (TEE);  Surgeon: [name], MD;  Location: [facility];  Service: Open Heart Surgery;  Laterality: N/A;  . TONSILLECTOMY      Allergies  Allergen Reactions  . Carvedilol Other (See Comments)  . Duloxetine Other (See Comments)    Passing out  . Paroxetine Hcl Other (See Comments)    Hypotension  . Zaleplon Other (See Comments)    Allergies as of 02/19/2017      Reactions   Carvedilol Other (See Comments)   Duloxetine Other (See Comments)   Passing out    Paroxetine Hcl Other (See Comments)   Hypotension   Zaleplon Other (See Comments)      Medication List       Accurate as of 02/19/17 11:59 PM. Always use your most recent med list.          acetaminophen 500 MG tablet Commonly known as:  TYLENOL Take 500 mg by mouth every 6 (six) hours as needed.   alum & mag hydroxide-simeth 161-096-04 MG/5ML suspension Commonly known as:  MAALOX PLUS Take 30 mLs by mouth every 4 (four) hours as needed for indigestion.   benzonatate 200 MG capsule Commonly known as:  TESSALON Take 200 mg by mouth every 8 (eight) hours as needed for cough.   bisacodyl 10 MG suppository Commonly known as:  DULCOLAX Place 10 mg rectally daily as needed.   BOUDREAUXS BUTT PASTE 16 % Oint Generic drug:  Zinc Oxide Apply liberal amount topically to area of skin irritation as needed. Ok to leave at bedside.   COMBIGAN 0.2-0.5 % ophthalmic solution Generic drug:  brimonidine-timolol Place 1 drop into both eyes 2 (two) times daily. [place] for patient to use his own bottle of eye drops   ENSURE Take 1 Can by mouth 2 (two) times daily between meals. ONLY GIVE IF HE IS NOT EATING. Family will provide the ensure.   GERI-TUSSIN DM 10-100 MG/5ML liquid Generic drug:  Dextromethorphan-Guaifenesin Take 5-10 mLs by mouth every 4 (four) hours as needed.   gluconic acid-citric acid irrigation Irrigate with 30 mLs as directed 2 (two) times daily. Clamp tubing for 30 minutes then unclamp and allow to flow   ipratropium-albuterol 0.5-2.5 (3) MG/3ML Soln Commonly known as:  DUONEB Take 3 mLs by nebulization 4 (four) times daily as needed.   ipratropium-albuterol 0.5-2.5 (3) MG/3ML Soln Commonly known as:  DUONEB Take 3 mLs by nebulization 3 (three) times daily.   levothyroxine 75 MCG tablet Commonly known as:  SYNTHROID, LEVOTHROID Take 75 mcg by mouth daily before breakfast.   LORazepam 0.5 MG tablet Commonly known as:  ATIVAN Take 0.5 mg by mouth every 4 (four)  hours as needed.   LUMIGAN 0.01 % Soln Generic drug:  bimatoprost Place 1 drop into both eyes at bedtime.   magnesium hydroxide 400 MG/5ML suspension Commonly known as:  MILK OF MAGNESIA Take 30 mLs by mouth every 4 (four) hours as needed for mild constipation. If constipation / no BM for 2 days   midodrine 5 MG tablet Commonly known as:  PROAMATINE Take 5 mg by mouth 3 (three) times daily with meals.   mirtazapine 15 MG tablet Commonly known as:  REMERON Take 15 mg by mouth at bedtime.   morphine 20 MG/ML concentrated solution Commonly known as:  ROXANOL Give 0.25 ml -0.5 ml by mouth every hour as needed for mild to severe pain. 0.25 = mild pain 0.5 =  severe pain   omeprazole 20 MG capsule Commonly known as:  PRILOSEC Take 20 mg by mouth daily before breakfast.   OXYGEN Inhale 2-5 L into the lungs continuous. Titrate to maintain sats > 90 % and/or for comfort   polyethylene glycol powder powder Commonly known as:  GLYCOLAX/MIRALAX Take 17 g by mouth daily as needed.   SENNA-PLUS 8.6-50 MG tablet Generic drug:  senna-docusate Take 1-2 tablets by mouth 2 (two) times daily as needed.   sertraline 100 MG tablet Commonly known as:  ZOLOFT Take 100 mg by mouth daily.   sorbitol 70 % solution Take 30 mLs by mouth daily as needed. Give every 2 hours until large BM, then change med to as needed   [place] 1/4 ribbon in both eyes at bedtime at least 30 minutes after placing drops for glaucoma (for dry eyes)       Review of Systems  HENT: Negative for congestion, dental problem, ear discharge, facial swelling, sinus pain, sinus pressure and sneezing.   Eyes: Positive for discharge and redness. Negative for photophobia, pain, itching and visual disturbance.  Respiratory: Negative for cough.   Gastrointestinal: Negative for constipation and vomiting.  Allergic/Immunologic: Negative for environmental allergies and food allergies.     Immunization History  Administered Date(s) Administered  . Influenza Split 02/19/2015  . Influenza-Unspecified 02/10/2016  . PPD Test 08/08/2016   Pertinent  Health Maintenance Due  Topic Date Due  . PNA vac Low Risk Adult (1 of 2 - PCV13) 06/13/1992  . INFLUENZA VACCINE  12/20/2016   Fall Risk  02/23/2015  Falls in the past year? No  Risk for fall due to : History of fall(s);Impaired balance/gait   Functional Status Survey:    Vitals:   02/19/17 1135  BP: 117/73  Pulse: 73  Resp: 16  Temp: 98.1 F (36.7 C)  SpO2: 96%  Weight: 174 lb 1.6 oz (79 kg)  Height: 6\' 1"  (1.854 m)   Body mass index is 22.97 kg/m. Physical Exam  Constitutional: He appears well-developed and well-nourished. No distress.  Eyes: Pupils are equal, round, and reactive to light. Conjunctivae, EOM and lids are normal. Left eye exhibits exudate. Left eye exhibits no chemosis, no discharge and no hordeolum. No foreign body present in the left eye.    Skin: He is not diaphoretic.    Labs reviewed: No results for input(s): NA, K, CL, CO2, GLUCOSE, BUN, CREATININE, CALCIUM, MG, PHOS in the last 8760 hours. No results for input(s): AST, ALT, ALKPHOS, BILITOT, PROT, ALBUMIN in the last 8760 hours. No results for input(s): WBC, NEUTROABS, HGB, HCT, MCV, PLT in the last 8760 hours. Lab Results  Component Value Date   TSH 3.385 12/30/2014   Lab Results  Component Value Date   HGBA1C 5.7 (H) 12/25/2014   No results found for: CHOL, HDL, LDLCALC, LDLDIRECT, TRIG, CHOLHDL  Significant Diagnostic Results in last 30 days:  No results found.  Assessment/Plan 1. Dry eye syndrome of both eyes   Refresh lacrilube eye ointment. 1/4" ribbon in both eyes Q HS for dry eyes. Use at least 30 minutes after use of eye drops for glaucoma  Clean eyes with warm, moist washcloth prn  Clean secretions from eyes with small amount of Baby Shampoo and warm, moist wash cloth prn  2. Scleral hemorrhage of right  eye  No treatment  Ice pack to the eye prn for pain  Family/ staff Communication:   Total Time:  Documentation:  Face  to Face:  Family/Phone:   Labs/tests ordered:   Medication list reviewed and assessed for continued appropriateness.  Vikki Ports, NP-C Geriatrics [facility] [phone] [place], [place] [address] Cell Phone (Mon-Fri 8am-5pm):  [phone] On Call:  [phone] & follow prompts after 5pm & weekends Office Phone:  [phone] Office Fax:  [phone]

## 2017-03-07 DIAGNOSIS — H04129 Dry eye syndrome of unspecified lacrimal gland: Secondary | ICD-10-CM | POA: Insufficient documentation

## 2017-03-22 ENCOUNTER — Encounter
Admission: RE | Admit: 2017-03-22 | Discharge: 2017-03-22 | Disposition: A | Discharge status: Home / Self Care | Payer: PPO | Source: Ambulatory Visit | Attending: Internal Medicine | Admitting: Internal Medicine

## 2017-03-23 ENCOUNTER — Encounter: Payer: Self-pay | Admitting: Gerontology

## 2017-03-23 ENCOUNTER — Non-Acute Institutional Stay (SKILLED_NURSING_FACILITY): Payer: Medicare Other | Admitting: Gerontology

## 2017-03-23 DIAGNOSIS — E871 Hypo-osmolality and hyponatremia: Secondary | ICD-10-CM

## 2017-03-23 DIAGNOSIS — E039 Hypothyroidism, unspecified: Secondary | ICD-10-CM

## 2017-03-23 DIAGNOSIS — G20 Parkinson's disease: Secondary | ICD-10-CM | POA: Diagnosis not present

## 2017-03-23 DIAGNOSIS — H16139 Photokeratitis, unspecified eye: Secondary | ICD-10-CM

## 2017-03-23 DIAGNOSIS — R630 Anorexia: Secondary | ICD-10-CM

## 2017-03-23 DIAGNOSIS — K219 Gastro-esophageal reflux disease without esophagitis: Secondary | ICD-10-CM

## 2017-03-23 DIAGNOSIS — R0600 Dyspnea, unspecified: Secondary | ICD-10-CM

## 2017-03-23 DIAGNOSIS — R339 Retention of urine, unspecified: Secondary | ICD-10-CM | POA: Diagnosis not present

## 2017-03-23 DIAGNOSIS — F419 Anxiety disorder, unspecified: Secondary | ICD-10-CM

## 2017-03-23 DIAGNOSIS — I480 Paroxysmal atrial fibrillation: Secondary | ICD-10-CM | POA: Diagnosis not present

## 2017-03-23 DIAGNOSIS — Z8546 Personal history of malignant neoplasm of prostate: Secondary | ICD-10-CM

## 2017-03-23 DIAGNOSIS — H04123 Dry eye syndrome of bilateral lacrimal glands: Secondary | ICD-10-CM

## 2017-03-23 DIAGNOSIS — I25119 Atherosclerotic heart disease of native coronary artery with unspecified angina pectoris: Secondary | ICD-10-CM

## 2017-03-23 DIAGNOSIS — I209 Angina pectoris, unspecified: Secondary | ICD-10-CM

## 2017-03-23 DIAGNOSIS — M6281 Muscle weakness (generalized): Secondary | ICD-10-CM

## 2017-03-23 DIAGNOSIS — H409 Unspecified glaucoma: Secondary | ICD-10-CM | POA: Diagnosis not present

## 2017-03-23 DIAGNOSIS — F329 Major depressive disorder, single episode, unspecified: Secondary | ICD-10-CM

## 2017-03-23 DIAGNOSIS — G20A1 Parkinson's disease without dyskinesia, without mention of fluctuations: Secondary | ICD-10-CM

## 2017-03-23 DIAGNOSIS — I959 Hypotension, unspecified: Secondary | ICD-10-CM

## 2017-04-05 ENCOUNTER — Non-Acute Institutional Stay (SKILLED_NURSING_FACILITY): Payer: Medicare Other | Admitting: Gerontology

## 2017-04-05 DIAGNOSIS — K567 Ileus, unspecified: Secondary | ICD-10-CM

## 2017-04-06 ENCOUNTER — Encounter: Payer: Self-pay | Admitting: Gerontology

## 2017-04-06 NOTE — Progress Notes (Signed)
Location:   [facility] Room Number: 967E Place of Service:  SNF [phone]) Provider:  Toni Arthurs, NP-C  Rusty Aus, MD  Patient Care Team: Rusty Aus, MD as PCP - General (Internal Medicine) Ubaldo Glassing Javier Docker, MD as Consulting Physician (Cardiology) Minna Merritts, MD as Consulting Physician (Cardiology)  Extended Emergency Contact Information Primary Emergency Contact: [name] Address: [address]          [place], [place] [address] [name] of [place] Phone: [phone] Mobile Phone: [phone] Relation: Spouse Secondary Emergency Contact: [place] of [place] Phone: [phone] Relation: Daughter  Code Status:  DNR Goals of care: Advanced Directive information Advanced Directives 04/05/2017  Does Patient Have a Medical Advance Directive? Yes  Type of Advance Directive Out of facility DNR (pink MOST or yellow form)  Does patient want to make changes to medical advance directive? No - Patient declined  Copy of [place] in Chart? -  Would patient like information on creating a medical advance directive? -     Chief Complaint  Patient presents with  . Acute Visit    Recheck abdominal pain    HPI:  Pt is a 83 y.o. male seen today for an acute visit for acute abdominal pain and abdominal distention.  Patient complains of intermittent sharp, lower abdominal pains.  He has been requesting as needed Maalox which has not been very effective.  Patient has indwelling Foley catheter in place.  Bladder scans have been performed to ensure no blockage.  Abdomen generalized distention, tight feeling.  Hypoactive/tympanic bowel sounds.  Patient reports small, liquid BM the day before.  Denies flatus since then.  Patient denies n/v/d/f/c/cp/sob/ha/dizziness cough/congestion.  Otherwise, vital signs stable.  No other complaints.   Past Medical History:  Diagnosis Date  . Abnormal stress  test   . Acquired hypothyroidism    unspecified  . AF (paroxysmal atrial fibrillation) ([place])   . Anxiety    just started med. for anxiety   . Cancer [facility])    prostate  . Colon cancer ([place]) 11/16/2013   partial colectomy  . Coronary artery disease   . Depression    unspecified  . GERD (gastroesophageal reflux disease)   . Glaucoma   . Hearing loss   . History of hiatal hernia   . Hyperlipidemia 11/16/2013  . Hypertension   . Pernicious anemia   . Prostate cancer ([place]) 11/16/2013   1/8 bx pos, waiting  . Reflux   . Reflux esophagitis 11/16/2013   Past Surgical History:  Procedure Laterality Date  . CATARACT EXTRACTION    . COLON SURGERY    . COLONOSCOPY    . CORONARY ARTERY BYPASS GRAFTING (CABG), ON PUMP, TIMES SIX, USING LEFT INTERNAL MAMMARY ARTERY, RIGHT GREATER SAPHENOUS VEIN HARVESTED ENDOSCOPICALLY N/A 12/28/2014   Performed by [name], MD at [facility]  . EYE SURGERY     catarcats removed bilataeral /w IOL  . FOOT SURGERY Right 2009   Shorten metatarsal  . HERNIA REPAIR Bilateral 1991   inguinal   . Left Heart Cath and Coronary Angiography N/A 12/10/2014   Performed by Teodoro Spray, MD at [facility]  . PARTIAL COLECTOMY     WITH ANANSTOMSIS  . TONSILLECTOMY    . TRANSESOPHAGEAL ECHOCARDIOGRAM (TEE) N/A 12/28/2014   Performed by [name], MD at [facility]    Allergies  Allergen Reactions  . Carvedilol Other (See  Comments)  . Duloxetine Other (See Comments)    Passing out  . Paroxetine Hcl Other (See Comments)    Hypotension  . Zaleplon Other (See Comments)    Allergies as of 04/05/2017      Reactions   Carvedilol Other (See Comments)   Duloxetine Other (See Comments)   Passing out   Paroxetine Hcl Other (See Comments)   Hypotension   Zaleplon Other (See Comments)      Medication List        Accurate as of 04/05/17 11:59 PM. Always use your most recent med list.          acetaminophen 500 MG tablet Commonly known as:   TYLENOL Take 500 mg by mouth every 6 (six) hours as needed.   alum & mag hydroxide-simeth 884-166-06 MG/5ML suspension Commonly known as:  MAALOX PLUS Take 30 mLs by mouth every 4 (four) hours as needed for indigestion.   benzonatate 200 MG capsule Commonly known as:  TESSALON Take 200 mg by mouth every 8 (eight) hours as needed for cough.   bisacodyl 10 MG suppository Commonly known as:  DULCOLAX Place 10 mg rectally daily as needed.   BOUDREAUXS BUTT PASTE 16 % Oint Generic drug:  Zinc Oxide Apply liberal amount topically to area of skin irritation as needed. Ok to leave at bedside.   COMBIGAN 0.2-0.5 % ophthalmic solution Generic drug:  brimonidine-timolol Place 1 drop into both eyes 2 (two) times daily. [place] for patient to use his own bottle of eye drops   ENSURE Take 1 Can by mouth 2 (two) times daily between meals. ONLY GIVE IF HE IS NOT EATING. Family will provide the ensure.   GERI-TUSSIN DM 10-100 MG/5ML liquid Generic drug:  Dextromethorphan-Guaifenesin Take 5-10 mLs by mouth every 4 (four) hours as needed.   gluconic acid-citric acid irrigation Irrigate with 30 mLs as directed 2 (two) times daily. Clamp tubing for 30 minutes then unclamp and allow to flow   imiquimod 5 % cream Commonly known as:  ALDARA See admin instructions. Apply thin film on and slightly around lesion on right ear tragus topically at bedtime on Mon, Tue, Wed, Thur, and Friday.  Leave on for 8 hours. Wash off with soap & water in am.   ipratropium-albuterol 0.5-2.5 (3) MG/3ML Soln Commonly known as:  DUONEB Take 3 mLs by nebulization 4 (four) times daily as needed.   ipratropium-albuterol 0.5-2.5 (3) MG/3ML Soln Commonly known as:  DUONEB Take 3 mLs by nebulization 3 (three) times daily.   levothyroxine 75 MCG tablet Commonly known as:  SYNTHROID, LEVOTHROID Take 75 mcg by mouth daily before breakfast.   LORazepam 0.5 MG tablet Commonly known as:  ATIVAN Take 0.5 mg by mouth every 4  (four) hours as needed.   LUMIGAN 0.01 % Soln Generic drug:  bimatoprost Place 1 drop into both eyes at bedtime. Wife will supply from outside pharmacy. Let wife know supply is low.   magnesium hydroxide 400 MG/5ML suspension Commonly known as:  MILK OF MAGNESIA Take 30 mLs by mouth every 4 (four) hours as needed for mild constipation. If constipation / no BM for 2 days   metoCLOPramide 10 MG tablet Commonly known as:  REGLAN Take 10 mg every 6 (six) hours by mouth.   midodrine 5 MG tablet Commonly known as:  PROAMATINE Take 5 mg by mouth 3 (three) times daily with meals.   mirtazapine 15 MG tablet Commonly known as:  REMERON Take 15 mg by mouth at bedtime.  morphine 20 MG/ML concentrated solution Commonly known as:  ROXANOL Give 0.25 ml -0.5 ml by mouth every hour as needed for mild to severe pain. 0.25 = mild pain 0.5 = severe pain   nystatin cream Commonly known as:  MYCOSTATIN Apply thin film topically to rash on groin 2 times daily until healed   omeprazole 20 MG capsule Commonly known as:  PRILOSEC Take 20 mg by mouth daily before breakfast.   OXYGEN Inhale 2-5 L into the lungs continuous. Titrate to maintain sats > 90 % and/or for comfort   polyethylene glycol powder powder Commonly known as:  GLYCOLAX/MIRALAX Take 17 g by mouth daily as needed.   SENNA-PLUS 8.6-50 MG tablet Generic drug:  senna-docusate Take 1-2 tablets by mouth 2 (two) times daily as needed.   sertraline 100 MG tablet Commonly known as:  ZOLOFT Take 100 mg by mouth daily.   sorbitol 70 % solution Take 30 mLs by mouth daily as needed. Give every 2 hours until large BM, then change med to as needed   [place] 1/4 ribbon in both eyes at bedtime at least 30 minutes after placing drops for glaucoma (for dry eyes)       Review of Systems  Constitutional: Negative for activity change, appetite change, chills, diaphoresis and fever.  HENT: Negative for  congestion, sneezing, sore throat, trouble swallowing and voice change.   Respiratory: Negative for apnea, cough, choking, chest tightness, shortness of breath and wheezing.   Cardiovascular: Negative for chest pain, palpitations and leg swelling.  Gastrointestinal: Positive for abdominal distention and abdominal pain. Negative for anal bleeding, blood in stool, constipation, diarrhea, nausea, rectal pain and vomiting.  Genitourinary: Negative for difficulty urinating, dysuria, frequency and urgency.  Musculoskeletal: Negative for back pain, gait problem and myalgias. Arthralgias: typical arthritis.  Skin: Negative for color change, pallor, rash and wound.  Neurological: Negative for dizziness, tremors, syncope, speech difficulty, weakness, numbness and headaches.  Psychiatric/Behavioral: Negative for agitation and behavioral problems.  All other systems reviewed and are negative.   Immunization History  Administered Date(s) Administered  . Influenza Split 02/19/2015  . Influenza-Unspecified 02/10/2016, 02/18/2017  . PPD Test 08/08/2016   Pertinent  Health Maintenance Due  Topic Date Due  . PNA vac Low Risk Adult (1 of 2 - PCV13) 06/13/1992  . INFLUENZA VACCINE  Completed   Fall Risk  02/23/2015  Falls in the past year? No  Risk for fall due to : History of fall(s);Impaired balance/gait   Functional Status Survey:    Vitals:   04/05/17 1225  BP: 107/78  Pulse: 60  Resp: 16  Temp: 98 F (36.7 C)  TempSrc: Oral  SpO2: 96%  Weight: 165 lb (74.8 kg)  Height: 6\' 1"  (1.854 m)   Body mass index is 21.77 kg/m. Physical Exam  Constitutional: He is oriented to person, place, and time. Vital signs are normal. He appears well-developed and well-nourished. He is active and cooperative. He does not appear ill. No distress.  HENT:  Head: Normocephalic and atraumatic.  Mouth/Throat: Uvula is midline, oropharynx is clear and moist and mucous membranes are normal. Mucous membranes are  not pale, not dry and not cyanotic.  Eyes: Conjunctivae, EOM and lids are normal. Pupils are equal, round, and reactive to light.  Neck: Trachea normal, normal range of motion and full passive range of motion without pain. Neck supple. No JVD present. No tracheal deviation, no edema and no erythema present. No thyromegaly present.  Cardiovascular: Normal  rate, regular rhythm, intact distal pulses and normal pulses. Exam reveals friction rub. Exam reveals no gallop and no distant heart sounds.  No murmur heard. Pulses:      Dorsalis pedis pulses are 2+ on the right side, and 2+ on the left side.  No edema  Pulmonary/Chest: Effort normal and breath sounds normal. No accessory muscle usage. No respiratory distress. He has no decreased breath sounds. He has no wheezes. He has no rhonchi. He has no rales. He exhibits no tenderness.  Abdominal: He exhibits no distension and no ascites. Bowel sounds are decreased. There is generalized tenderness. There is no rebound and no CVA tenderness.  Musculoskeletal: Normal range of motion. He exhibits no edema or tenderness.  Expected osteoarthritis, stiffness  Neurological: He is alert and oriented to person, place, and time. He has normal strength. Coordination and gait abnormal.  Skin: Skin is warm, dry and intact. He is not diaphoretic. No cyanosis. No pallor. Nails show no clubbing.  Psychiatric: He has a normal mood and affect. His speech is normal and behavior is normal. Judgment and thought content normal. Cognition and memory are normal.  Nursing note and vitals reviewed.   Labs reviewed: No results for input(s): NA, K, CL, CO2, GLUCOSE, BUN, CREATININE, CALCIUM, MG, PHOS in the last 8760 hours. No results for input(s): AST, ALT, ALKPHOS, BILITOT, PROT, ALBUMIN in the last 8760 hours. No results for input(s): WBC, NEUTROABS, HGB, HCT, MCV, PLT in the last 8760 hours. Lab Results  Component Value Date   TSH 3.385 12/30/2014   Lab Results  Component  Value Date   HGBA1C 5.7 (H) 12/25/2014   No results found for: CHOL, HDL, LDLCALC, LDLDIRECT, TRIG, CHOLHDL  Significant Diagnostic Results in last 30 days:  No results found.  Assessment/Plan 1.  Ileus of unspecified type  Patient refused abdominal x-rays, IV fluids and IV medications due to concern for cost  Patient agreeable to p.o. medications for a 1 day trial  If symptoms do not improve by tomorrow, patient agreeable to IV fluids and IV medications with x-ray  Clear liquid diet  Bisacodyl 10 mg suppository twice daily times 2 days  Metoclopramide 10 mg 1 tablet p.o. every 6 hours times 3 days  Updated hospice nurse  Family/ staff Communication:   Total Time:  Documentation:  Face to Face:  Family/Phone:   Labs/tests ordered:    Medication list reviewed and assessed for continued appropriateness.  Vikki Ports, NP-C Geriatrics [facility] [phone], [place] [address] Cell Phone (Mon-Fri 8am-5pm):  [phone] On Call:  [phone] & follow prompts after 5pm & weekends Office Phone:  [phone] Office Fax:  [phone]

## 2017-04-09 NOTE — Progress Notes (Signed)
Location:   [facility] Room Number: [place]:  SNF [phone]) Provider:  Toni Arthurs, NP-C  Rusty Aus, MD  Patient Care Team: Rusty Aus, MD as PCP - General (Internal Medicine) Jacob Reyes Javier Docker, MD as Consulting Physician (Cardiology) Minna Merritts, MD as Consulting Physician (Cardiology)  Extended Emergency Contact Information Primary Emergency Contact: Jacob Reyes Address: [address]          [place], [place] [address] Jacob Reyes of [place] Phone: [phone] Mobile Phone: [phone] Relation: Spouse Secondary Emergency Contact: Jacob Reyes [place] Phone: [phone] Relation: Daughter  Code Status:  DNR Goals of care: Advanced Directive information Advanced Directives 04/05/2017  Does Patient Have a Medical Advance Directive? Yes  Type of Advance Directive Out of facility DNR (pink MOST or yellow form)  Does patient want to make changes to medical advance directive? No - Patient declined  Copy of [place] in Chart? -  Would patient like information on creating a medical advance directive? -     Chief Complaint  Patient presents with  . Medical Management of Chronic Issues    Routine Visit    HPI:  Jacob Reyes is a 83 y.o. male seen today for medical management of chronic diseases.  Patient has been stable over the past month.  He is on hospice services for EOL care related to Parkinson's disease.  He is bed bound, gets out of bed with use of Hoyer lift.  Patient is pleasant, always smiling.  Appetite is fair.  Having regular BMs with occasional constipation.  Denies cough, congestion at this time.  On scheduled DuoNeb's for ease of administration due to weakness.  No falls this month.  Denies pain.  Intermittent dyspnea.  Patient denies n/v/d/f/c/cp/sob/ha/abd pain/dizziness/cough/congestion.  Patient continues with persistent hypotension but is asymptomatic.  No repeat  episodes of anxiety.  Patient has had some increased dry eyes.  Eyes have responded well to Lacri-Lube.  Vital signs stable.  No other complaints.  Past Medical History:  Diagnosis Date  . Abnormal stress test   . Acquired hypothyroidism    unspecified  . AF (paroxysmal atrial fibrillation) ([place])   . Anxiety    just started med. for anxiety   . Cancer [facility])    prostate  . Colon cancer ([place]) 11/16/2013   partial colectomy  . Coronary artery disease   . Depression    unspecified  . GERD (gastroesophageal reflux disease)   . Glaucoma   . Hearing loss   . History of hiatal hernia   . Hyperlipidemia 11/16/2013  . Hypertension   . Pernicious anemia   . Prostate cancer ([place]) 11/16/2013   1/8 bx pos, waiting  . Reflux   . Reflux esophagitis 11/16/2013   Past Surgical History:  Procedure Laterality Date  . CATARACT EXTRACTION    . COLON SURGERY    . COLONOSCOPY    . CORONARY ARTERY BYPASS GRAFTING (CABG), ON PUMP, TIMES SIX, USING LEFT INTERNAL MAMMARY ARTERY, RIGHT GREATER SAPHENOUS VEIN HARVESTED ENDOSCOPICALLY N/A 12/28/2014   Performed by [name], MD at [facility]  . EYE SURGERY     catarcats removed bilataeral /w IOL  . FOOT SURGERY Right 2009   Shorten metatarsal  . HERNIA REPAIR Bilateral 1991   inguinal   . Left Heart Cath and Coronary Angiography N/A 12/10/2014   Performed by Teodoro Spray, MD at [facility]  . PARTIAL  COLECTOMY     WITH ANANSTOMSIS  . TONSILLECTOMY    . TRANSESOPHAGEAL ECHOCARDIOGRAM (TEE) N/A 12/28/2014   Performed by [name], MD at [facility] OR    Allergies  Allergen Reactions  . Carvedilol Other (See Comments)  . Duloxetine Other (See Comments)    Passing out  . Paroxetine Hcl Other (See Comments)    Hypotension  . Zaleplon Other (See Comments)    Allergies as of 03/23/2017      Reactions   Carvedilol Other (See Comments)   Duloxetine Other (See Comments)   Passing out   Paroxetine Hcl Other (See Comments)    Hypotension   Zaleplon Other (See Comments)      Medication List        Accurate as of 03/23/17 11:59 PM. Always use your most recent med list.          acetaminophen 500 MG tablet Commonly known as:  TYLENOL Take 500 mg by mouth every 6 (six) hours as needed.   alum & mag hydroxide-simeth [phone] MG/5ML suspension Commonly known as:  MAALOX PLUS Take 30 mLs by mouth every 4 (four) hours as needed for indigestion.   benzonatate 200 MG capsule Commonly known as:  TESSALON Take 200 mg by mouth every 8 (eight) hours as needed for cough.   bisacodyl 10 MG suppository Commonly known as:  DULCOLAX Place 10 mg rectally daily as needed.   BOUDREAUXS BUTT PASTE 16 % Oint Generic drug:  Zinc Oxide Apply liberal amount topically to area of skin irritation as needed. Ok to leave at bedside.   COMBIGAN 0.2-0.5 % ophthalmic solution Generic drug:  brimonidine-timolol Place 1 drop into both eyes 2 (two) times daily. [place] for patient to use his own bottle of eye drops   ENSURE Take 1 Can by mouth 2 (two) times daily between meals. ONLY GIVE IF HE IS NOT EATING. Family will provide the ensure.   GERI-TUSSIN DM 10-100 MG/5ML liquid Generic drug:  Dextromethorphan-Guaifenesin Take 5-10 mLs by mouth every 4 (four) hours as needed.   gluconic acid-citric acid irrigation Irrigate with 30 mLs as directed 2 (two) times daily. Clamp tubing for 30 minutes then unclamp and allow to flow   ipratropium-albuterol 0.5-2.5 (3) MG/3ML Soln Commonly known as:  DUONEB Take 3 mLs by nebulization 4 (four) times daily as needed.   ipratropium-albuterol 0.5-2.5 (3) MG/3ML Soln Commonly known as:  DUONEB Take 3 mLs by nebulization 3 (three) times daily.   levothyroxine 75 MCG tablet Commonly known as:  SYNTHROID, LEVOTHROID Take 75 mcg by mouth daily before breakfast.   LORazepam 0.5 MG tablet Commonly known as:  ATIVAN Take 0.5 mg by mouth every 4 (four) hours as needed.   LUMIGAN 0.01 %  Soln Generic drug:  bimatoprost Place 1 drop into both eyes at bedtime. Wife will supply from outside pharmacy. Let wife know supply is low.   magnesium hydroxide 400 MG/5ML suspension Commonly known as:  MILK OF MAGNESIA Take 30 mLs by mouth every 4 (four) hours as needed for mild constipation. If constipation / no BM for 2 days   midodrine 5 MG tablet Commonly known as:  PROAMATINE Take 5 mg by mouth 3 (three) times daily with meals.   mirtazapine 15 MG tablet Commonly known as:  REMERON Take 15 mg by mouth at bedtime.   morphine 20 MG/ML concentrated solution Commonly known as:  ROXANOL Give 0.25 ml -0.5 ml by mouth every hour as needed for mild to severe pain.  0.25 = mild pain 0.5 = severe pain   nystatin cream Commonly known as:  MYCOSTATIN Apply thin film topically to rash on groin 2 times daily until healed   omeprazole 20 MG capsule Commonly known as:  PRILOSEC Take 20 mg by mouth daily before breakfast.   OXYGEN Inhale 2-5 L into the lungs continuous. Titrate to maintain sats > 90 % and/or for comfort   polyethylene glycol powder powder Commonly known as:  GLYCOLAX/MIRALAX Take 17 g by mouth daily as needed.   SENNA-PLUS 8.6-50 MG tablet Generic drug:  senna-docusate Take 1-2 tablets by mouth 2 (two) times daily as needed.   sertraline 100 MG tablet Commonly known as:  ZOLOFT Take 100 mg by mouth daily.   sorbitol 70 % solution Take 30 mLs by mouth daily as needed. Give every 2 hours until large BM, then change med to as needed   [place] 1/4 ribbon in both eyes at bedtime at least 30 minutes after placing drops for glaucoma (for dry eyes)       Review of Systems  Constitutional: Negative for activity change, appetite change, chills, diaphoresis and fever.  HENT: Negative.  Negative for congestion, sneezing, sore throat, trouble swallowing and voice change.   Respiratory: Negative.  Negative for apnea, cough, choking, chest  tightness, shortness of breath and wheezing.   Cardiovascular: Negative.  Negative for chest pain and palpitations.  Gastrointestinal: Negative.  Negative for abdominal distention, abdominal pain, constipation, diarrhea and nausea.  Genitourinary: Positive for difficulty urinating (foley in place). Negative for dysuria, frequency and urgency.  Musculoskeletal: Negative.  Negative for back pain, gait problem and myalgias. Arthralgias: typical arthritis.  Skin: Negative for color change, pallor, rash and wound.  Neurological: Negative.  Negative for dizziness, tremors, syncope, speech difficulty, weakness, numbness and headaches.  Hematological: Negative.   Psychiatric/Behavioral: Negative.  Negative for agitation and behavioral problems.  All other systems reviewed and are negative.   Immunization History  Administered Date(s) Administered  . Influenza Split 02/19/2015  . Influenza-Unspecified 02/10/2016, 02/18/2017  . PPD Test 08/08/2016   Pertinent  Health Maintenance Due  Topic Date Due  . PNA vac Low Risk Adult (1 of 2 - PCV13) 06/13/1992  . INFLUENZA VACCINE  Completed   Fall Risk  02/23/2015  Falls in the past year? No  Risk for fall due to : History of fall(s);Impaired balance/gait   Functional Status Survey:    Vitals:   03/23/17 1023  BP: 107/78  Pulse: 60  Resp: 16  Temp: 98 F (36.7 C)  SpO2: 96%  Weight: 177 lb 1.6 oz (80.3 kg)  Height: 6\' 1"  (1.854 m)   Body mass index is 23.37 kg/m. Physical Exam  Constitutional: He is oriented to person, place, and time. Vital signs are normal. He appears well-developed. He appears cachectic. He is active and cooperative. He does not appear ill. No distress.  HENT:  Head: Normocephalic and atraumatic.  Mouth/Throat: Uvula is midline, oropharynx is clear and moist and mucous membranes are normal. Mucous membranes are not pale, not dry and not cyanotic.  Eyes: Conjunctivae, EOM and lids are normal. Pupils are equal, round,  and reactive to light.  Neck: Trachea normal, normal range of motion and full passive range of motion without pain. Neck supple. No JVD present. No tracheal deviation, no edema and no erythema present. No thyromegaly present.  Cardiovascular: Normal rate, regular rhythm, intact distal pulses and normal pulses. Exam reveals no gallop, no distant heart  sounds and no friction rub.  No murmur heard. Pulses:      Dorsalis pedis pulses are 2+ on the right side, and 2+ on the left side.  Minimal edema  Pulmonary/Chest: Breath sounds normal. No accessory muscle usage. No respiratory distress. He has no decreased breath sounds. He has no wheezes. He has no rhonchi. He has no rales. He exhibits no tenderness.  Abdominal: Soft. Normal appearance and bowel sounds are normal. He exhibits no distension and no ascites. There is no tenderness.  Genitourinary:  Genitourinary Comments: Foley in place  Musculoskeletal: Normal range of motion. He exhibits no edema or tenderness.  Expected osteoarthritis, stiffness  Neurological: He is alert and oriented to person, place, and time. He has normal strength. Coordination and gait abnormal.  Skin: Skin is warm, dry and intact. No rash noted. He is not diaphoretic. No cyanosis or erythema. No pallor. Nails show no clubbing.  Psychiatric: He has a normal mood and affect. His speech is normal and behavior is normal. Judgment and thought content normal. Cognition and memory are normal.  Nursing note and vitals reviewed.   Labs reviewed: No results for input(s): NA, K, CL, CO2, GLUCOSE, BUN, CREATININE, CALCIUM, MG, PHOS in the last 8760 hours. No results for input(s): AST, ALT, ALKPHOS, BILITOT, PROT, ALBUMIN in the last 8760 hours. No results for input(s): WBC, NEUTROABS, HGB, HCT, MCV, PLT in the last 8760 hours. Lab Results  Component Value Date   TSH 3.385 12/30/2014   Lab Results  Component Value Date   HGBA1C 5.7 (H) 12/25/2014   No results found for: CHOL,  HDL, LDLCALC, LDLDIRECT, TRIG, CHOLHDL  Significant Diagnostic Results in last 30 days:  No results found.  Assessment/Plan 1. Parkinson disease ([place])  Stable  Continue Hospice services for EOL Care  2. Paroxysmal atrial fibrillation (HCC)  Stable   3. Hypo-osmolality and hyponatremia  Stable   4. Retention of urine, unspecified  Stable  Continue foley catheter  Continue Renacidin (citric ac-gluconolact-mag carb) solution; 6.602-3.268 gram/100 mL; amt: 30 mL; irrigation- Instill 30 mL solution into foley catheter tubing. Clamp tubing for 30 minutes then unclamp and allow to flow.  5. Gastroesophageal reflux disease without esophagitis  Stable  Continue Prilosec OTC (omeprazole magnesium) [OTC] tablet,delayed release (DR/EC); 20 mg PO Q Day  6. Hypothyroidism, unspecified type  Stable  Continue levothyroxine tablet; 75 mcg once daily  7. Glaucoma of both eyes, unspecified glaucoma type  Stable  Continue Combigan (brimonidine-timolol) drops; 0.2-0.5 %; amt: 1 gtt both eyes BID  Continue Lumigan (bimatoprost) drops; 0.01 % 1 drop both eyes Q HS  8. Atherosclerosis of native coronary artery of native heart with angina pectoris (HCC)  Stable   9. Personal history of malignant neoplasm of prostate  Stable   10. Hypotension, unspecified hypotension type  Stable  Continue midodrine tablet; 5 mg 1 tablet TID  11. Muscle weakness (generalized)  Stable   12. Major depressive disorder with single episode, remission status unspecified  Stable  Continue sertraline tablet; 100 mg; amt: 1 tablet Q Day  13. Anxiety disorder, unspecified type  Stable  Continue lorazepam - Schedule IV tablet; 0.5 mg; amt: 1 tablet Q 4 hours prn  14. Poor appetite  Stable  Continue Ensure (food supplemt, lactose-reduced) [OTC] liquid; amt: 1 bottle/can BID PRN if Jacob Reyes doesn't eat meal  Continue mirtazapine tablet; 15 mg; amt: 1 tablet Q HS  15. Dyspnea, unspecified  type  Stable  Continue Benzonatate capsule; 200 mg Q 8 hours  prn  Continue ipratropium-albuterol solution for nebulization; 0.5 mg-3 mg(2.5 mg base)/3 mL QID PRN  Continue ipratropium-albuterol solution for nebulization; 0.5 mg-3 mg(2.5 mg base)/3 mL; amt: 3 mL TID scheduled  Continue morphine - Schedule II solution; 20 mg/1 mL; amt: 0.25-0.5 mL Q 1 hour prn  Continue O2 2 L [place]  16.  Actinic keratosis, unspecified laterality  Restart imiquimod cream 5% per patient request.  Apply thin film on and slightly around lesion on the right ear tragus nightly.  Wash off.  And water after 8 hours  Follow-up with dermatologist as needed  17.  Dry eye syndrome of both eyes  Refresh Lacri-Lube 56.8-42.5% 1/4 inch ribbon in both eyes nightly  Family/ staff Communication:  Total Time:  Documentation:  Face to Face:  Family/Phone:   Labs/tests ordered:    Medication list reviewed and assessed for continued appropriateness. Monthly medication orders reviewed and signed.  Vikki Ports, NP-C Geriatrics [facility] [id number], [place] [address] Cell Phone (Mon-Fri 8am-5pm):  [phone] On Call:  [phone] & follow prompts after 5pm & weekends Office Phone:  [phone] Office Fax:  [phone]

## 2017-04-21 ENCOUNTER — Encounter
Admission: RE | Admit: 2017-04-21 | Discharge: 2017-04-21 | Disposition: A | Discharge status: Home / Self Care | Payer: PPO | Source: Ambulatory Visit | Attending: Internal Medicine | Admitting: Internal Medicine

## 2017-04-24 ENCOUNTER — Encounter: Payer: Self-pay | Admitting: Gerontology

## 2017-05-01 DIAGNOSIS — H401132 Primary open-angle glaucoma, bilateral, moderate stage: Secondary | ICD-10-CM | POA: Diagnosis not present

## 2017-05-10 DIAGNOSIS — D1800 Hemangioma unspecified site: Secondary | ICD-10-CM | POA: Diagnosis not present

## 2017-05-10 DIAGNOSIS — D485 Neoplasm of uncertain behavior of skin: Secondary | ICD-10-CM | POA: Diagnosis not present

## 2017-05-10 DIAGNOSIS — L578 Other skin changes due to chronic exposure to nonionizing radiation: Secondary | ICD-10-CM | POA: Diagnosis not present

## 2017-05-10 DIAGNOSIS — L570 Actinic keratosis: Secondary | ICD-10-CM | POA: Diagnosis not present

## 2017-05-10 DIAGNOSIS — L821 Other seborrheic keratosis: Secondary | ICD-10-CM | POA: Diagnosis not present

## 2017-05-10 DIAGNOSIS — L820 Inflamed seborrheic keratosis: Secondary | ICD-10-CM | POA: Diagnosis not present

## 2017-05-10 DIAGNOSIS — L812 Freckles: Secondary | ICD-10-CM | POA: Diagnosis not present

## 2017-05-13 ENCOUNTER — Other Ambulatory Visit
Admission: RE | Admit: 2017-05-13 | Discharge: 2017-05-13 | Disposition: A | Discharge status: Home / Self Care | Source: Ambulatory Visit | Attending: Gerontology | Admitting: Gerontology

## 2017-05-13 DIAGNOSIS — R369 Urethral discharge, unspecified: Secondary | ICD-10-CM | POA: Insufficient documentation

## 2017-05-13 LAB — URINALYSIS, COMPLETE (UACMP) WITH MICROSCOPIC
BILIRUBIN URINE: NEGATIVE
GLUCOSE, UA: NEGATIVE mg/dL
HGB URINE DIPSTICK: NEGATIVE
Ketones, ur: NEGATIVE mg/dL
NITRITE: POSITIVE — AB
Protein, ur: 30 mg/dL — AB
SPECIFIC GRAVITY, URINE: 1.02 (ref 1.005–1.030)
Squamous Epithelial / LPF: NONE SEEN
pH: 5 (ref 5.0–8.0)

## 2017-05-17 LAB — URINE CULTURE: Culture: >=100000 — AB

## 2017-05-22 ENCOUNTER — Encounter
Admission: RE | Admit: 2017-05-22 | Discharge: 2017-05-22 | Disposition: A | Discharge status: Home / Self Care | Payer: PPO | Source: Ambulatory Visit | Attending: Internal Medicine | Admitting: Internal Medicine

## 2017-05-31 ENCOUNTER — Non-Acute Institutional Stay (SKILLED_NURSING_FACILITY): Payer: Medicare Other | Admitting: Gerontology

## 2017-05-31 ENCOUNTER — Encounter: Payer: Self-pay | Admitting: Gerontology

## 2017-05-31 DIAGNOSIS — I25119 Atherosclerotic heart disease of native coronary artery with unspecified angina pectoris: Secondary | ICD-10-CM

## 2017-05-31 DIAGNOSIS — I959 Hypotension, unspecified: Secondary | ICD-10-CM

## 2017-05-31 DIAGNOSIS — I480 Paroxysmal atrial fibrillation: Secondary | ICD-10-CM

## 2017-05-31 DIAGNOSIS — I209 Angina pectoris, unspecified: Secondary | ICD-10-CM | POA: Diagnosis not present

## 2017-05-31 NOTE — Progress Notes (Signed)
Location:   [facility] Room Number: 814G Place of Service:  SNF [phone]) Provider:  Toni Arthurs, NP-C  Rusty Aus, MD  Patient Care Team: Rusty Aus, MD as PCP - General (Internal Medicine) Ubaldo Glassing Javier Docker, MD as Consulting Physician (Cardiology) Minna Merritts, MD as Consulting Physician (Cardiology)  Extended Emergency Contact Information Primary Emergency Contact: [name] Address: [address]          [place], [place] [address] [name] of [place] Phone: [phone] Mobile Phone: [phone] Relation: Spouse Secondary Emergency Contact: [place] of [place] Phone: [phone] Relation: Daughter  Code Status:  DNR Goals of care: Advanced Directive information Advanced Directives 05/31/2017  Does Patient Have a Medical Advance Directive? Yes  Type of Advance Directive Out of facility DNR (pink MOST or yellow form)  Does patient want to make changes to medical advance directive? No - Patient declined  Copy of [place] in Chart? -  Would patient like information on creating a medical advance directive? -     Chief Complaint  Patient presents with  . Medical Management of Chronic Issues    Routine Visit    HPI:  Pt is a 84 y.o. male seen today for medical management of chronic diseases.    Paroxysmal atrial fibrillation (HCC) Stable.  No recent exacerbations.  Symptoms controlled without medication..  Hypotension, unspecified hypotension type Stable.  No recent episodes of hypotension or orthostasis.  Symptoms managed on Midodrin 5 mg 3 times daily.  No complaints of chest pain or shortness of breath.  Atherosclerotic heart disease of native coronary artery with angina pectoris ([place]) Stable.  No complaints of chest pain/tightness or angina.  Patient continues on hospice services.  Foley catheter remains intact for urinary retention and comfort.   Past Medical  History:  Diagnosis Date  . Abnormal stress test   . Acquired hypothyroidism    unspecified  . AF (paroxysmal atrial fibrillation) ([place])   . Anxiety    just started med. for anxiety   . Cancer [facility])    prostate  . Colon cancer ([facility]) 11/16/2013   partial colectomy  . Coronary artery disease   . Depression    unspecified  . GERD (gastroesophageal reflux disease)   . Glaucoma   . Hearing loss   . History of hiatal hernia   . Hyperlipidemia 11/16/2013  . Hypertension   . Pernicious anemia   . Prostate cancer ([place]) 11/16/2013   1/8 bx pos, waiting  . Reflux   . Reflux esophagitis 11/16/2013   Past Surgical History:  Procedure Laterality Date  . CARDIAC CATHETERIZATION N/A 12/10/2014   Procedure: Left Heart Cath and Coronary Angiography;  Surgeon: Teodoro Spray, MD;  Location[facility];  Service: Cardiovascular;  Laterality: N/A;  . CATARACT EXTRACTION    . COLON SURGERY    . COLONOSCOPY    . CORONARY ARTERY BYPASS GRAFT N/A 12/28/2014   Procedure: CORONARY ARTERY BYPASS GRAFTING (CABG), ON PUMP, TIMES SIX, USING LEFT INTERNAL MAMMARY ARTERY, RIGHT GREATER SAPHENOUS VEIN HARVESTED ENDOSCOPICALLY;  Surgeon: [name], MD;  Location: [facility];  Service: Open Heart Surgery;  Laterality: N/A;  -SEQ LIMA to MID LAD and DISTAL LAD -SVG to DIAGONAL -SEQ SVG to OM1 and OM2 -SVG to PDA  . EYE SURGERY     catarcats removed bilataeral /w IOL  . FOOT SURGERY Right 2009   Shorten metatarsal  . HERNIA REPAIR  Bilateral 1991   inguinal   . PARTIAL COLECTOMY     WITH ANANSTOMSIS  . TEE WITHOUT CARDIOVERSION N/A 12/28/2014   Procedure: TRANSESOPHAGEAL ECHOCARDIOGRAM (TEE);  Surgeon: [name], MD;  Location: [facility];  Service: Open Heart Surgery;  Laterality: N/A;  . TONSILLECTOMY      Allergies  Allergen Reactions  . Carvedilol Other (See Comments)  . Duloxetine Other (See Comments)    Passing out  . Paroxetine Hcl Other (See Comments)    Hypotension  . Zaleplon  Other (See Comments)    Allergies as of 05/31/2017      Reactions   Carvedilol Other (See Comments)   Duloxetine Other (See Comments)   Passing out   Paroxetine Hcl Other (See Comments)   Hypotension   Zaleplon Other (See Comments)      Medication List        Accurate as of 05/31/17 12:50 PM. Always use your most recent med list.          acetaminophen 500 MG tablet Commonly known as:  TYLENOL Take 500 mg by mouth every 6 (six) hours as needed.   alum & mag hydroxide-simeth 347-425-95 MG/5ML suspension Commonly known as:  MAALOX PLUS Take 30 mLs by mouth every 4 (four) hours as needed for indigestion.   benzonatate 200 MG capsule Commonly known as:  TESSALON Take 200 mg by mouth every 8 (eight) hours as needed for cough.   bisacodyl 10 MG suppository Commonly known as:  DULCOLAX Place 10 mg rectally daily as needed.   BOUDREAUXS BUTT PASTE 16 % Oint Generic drug:  Zinc Oxide Apply liberal amount topically to area of skin irritation as needed. Ok to leave at bedside.   COMBIGAN 0.2-0.5 % ophthalmic solution Generic drug:  brimonidine-timolol Place 1 drop into both eyes 2 (two) times daily. [place] for patient to use his own bottle of eye drops   ENSURE Take 1 Can by mouth 2 (two) times daily between meals. ONLY GIVE IF HE IS NOT EATING. Family will provide the ensure.   GERI-TUSSIN DM 10-100 MG/5ML liquid Generic drug:  Dextromethorphan-Guaifenesin Take 5-10 mLs by mouth every 4 (four) hours as needed.   gluconic acid-citric acid irrigation Irrigate with 30 mLs as directed 2 (two) times daily. Clamp tubing for 30 minutes then unclamp and allow to flow   hydrocortisone cream 1 % Apply 1 application topically 4 (four) times daily as needed for itching. Apply to clean, dry hemorrhoids. Ok to change order to prn when resolved or pt request.   ipratropium-albuterol 0.5-2.5 (3) MG/3ML Soln Commonly known as:  DUONEB Take 3 mLs by nebulization 4 (four) times daily as  needed.   ipratropium-albuterol 0.5-2.5 (3) MG/3ML Soln Commonly known as:  DUONEB Take 3 mLs by nebulization 3 (three) times daily.   LACTOBACILLUS RHAMNOSUS (GG) PO Take 1 capsule by mouth daily.   latanoprost 0.005 % ophthalmic solution Commonly known as:  XALATAN Place 1 drop into both eyes at bedtime. Outside pharmacy wife will provide them   levothyroxine 75 MCG tablet Commonly known as:  SYNTHROID, LEVOTHROID Take 75 mcg by mouth daily before breakfast.   LUMIGAN 0.01 % Soln Generic drug:  bimatoprost Place 1 drop into both eyes at bedtime. Wife will supply from outside pharmacy. Let wife know supply is low.   magnesium hydroxide 400 MG/5ML suspension Commonly known as:  MILK OF MAGNESIA Take 30 mLs by mouth every 4 (four) hours as needed for mild constipation. If constipation / no  BM for 2 days   midodrine 5 MG tablet Commonly known as:  PROAMATINE Take 5 mg by mouth 3 (three) times daily with meals.   mirtazapine 15 MG tablet Commonly known as:  REMERON Take 15 mg by mouth at bedtime.   morphine 20 MG/ML concentrated solution Commonly known as:  ROXANOL Give 0.25 ml -0.5 ml by mouth every hour as needed for mild to severe pain. 0.25 = mild pain 0.5 = severe pain   nystatin cream Commonly known as:  MYCOSTATIN Apply thin film topically to rash on groin 2 times daily until healed   omeprazole 20 MG capsule Commonly known as:  PRILOSEC Take 20 mg by mouth daily before breakfast.   OXYGEN Inhale 2-5 L into the lungs continuous. Titrate to maintain sats > 90 % and/or for comfort   polyethylene glycol powder powder Commonly known as:  GLYCOLAX/MIRALAX Take 17 g by mouth daily as needed.   SENNA-PLUS 8.6-50 MG tablet Generic drug:  senna-docusate Take 1-2 tablets by mouth 2 (two) times daily as needed.   sertraline 100 MG tablet Commonly known as:  ZOLOFT Take 100 mg by mouth daily.   sorbitol 70 % solution Take 30 mLs by mouth daily as needed. Give  every 2 hours until large BM, then change med to as needed   [place] 1/4 ribbon in both eyes at bedtime at least 30 minutes after placing drops for glaucoma (for dry eyes)       Review of Systems  Constitutional: Negative for activity change, appetite change, chills, diaphoresis and fever.  HENT: Negative for congestion, mouth sores, nosebleeds, postnasal drip, sneezing, sore throat, trouble swallowing and voice change.   Respiratory: Negative for apnea, cough, choking, chest tightness, shortness of breath and wheezing.   Cardiovascular: Negative for chest pain, palpitations and leg swelling.  Gastrointestinal: Negative for abdominal distention, abdominal pain, constipation, diarrhea and nausea.  Genitourinary: Positive for difficulty urinating (Foley). Negative for dysuria, frequency and urgency.  Musculoskeletal: Negative for back pain, gait problem and myalgias. Arthralgias: typical arthritis.  Skin: Negative for color change, pallor, rash and wound.  Neurological: Negative for dizziness, tremors, syncope, speech difficulty, weakness, numbness and headaches.  Psychiatric/Behavioral: Negative for agitation and behavioral problems.  All other systems reviewed and are negative.   Immunization History  Administered Date(s) Administered  . Influenza Split 02/19/2015  . Influenza-Unspecified 02/10/2016, 02/18/2017  . PPD Test 08/08/2016   Pertinent  Health Maintenance Due  Topic Date Due  . PNA vac Low Risk Adult (1 of 2 - PCV13) 06/13/1992  . INFLUENZA VACCINE  Completed   Fall Risk  02/23/2015  Falls in the past year? No  Risk for fall due to : History of fall(s);Impaired balance/gait   Functional Status Survey:    Vitals:   05/31/17 1229  BP: 91/61  Pulse: 68  Resp: 20  Temp: 98.4 F (36.9 C)  TempSrc: Oral  SpO2: 98%  Weight: 169 lb 14.4 oz (77.1 kg)  Height: 6\' 1"  (1.854 m)   Body mass index is 22.42 kg/m. Physical Exam    Constitutional: He is oriented to person, place, and time. Vital signs are normal. He appears well-developed and well-nourished. He is active and cooperative. He does not appear ill. No distress.  HENT:  Head: Normocephalic and atraumatic.  Mouth/Throat: Uvula is midline, oropharynx is clear and moist and mucous membranes are normal. Mucous membranes are not pale, not dry and not cyanotic.  Eyes: Conjunctivae, EOM and lids  are normal. Pupils are equal, round, and reactive to light.  Neck: Trachea normal, normal range of motion and full passive range of motion without pain. Neck supple. No JVD present. No tracheal deviation, no edema and no erythema present. No thyromegaly present.  Cardiovascular: Normal rate, regular rhythm, normal heart sounds, intact distal pulses and normal pulses. Exam reveals no gallop, no distant heart sounds and no friction rub.  No murmur heard. Pulses:      Dorsalis pedis pulses are 2+ on the right side, and 2+ on the left side.  No edema  Pulmonary/Chest: Effort normal and breath sounds normal. No accessory muscle usage. No respiratory distress. He has no decreased breath sounds. He has no wheezes. He has no rhonchi. He has no rales. He exhibits no tenderness.  Abdominal: Soft. Normal appearance and bowel sounds are normal. He exhibits no distension and no ascites. There is no tenderness.  Genitourinary:  Genitourinary Comments: Indwelling Foley catheter in place for dysuria and comfort  Musculoskeletal: Normal range of motion. He exhibits no edema or tenderness.  Expected osteoarthritis, stiffness; Bilateral Calves soft, supple. Negative Homan's Sign. B- pedal pulses equal; generalized weakness; bedbound  Neurological: He is alert and oriented to person, place, and time. He has normal strength. He displays atrophy. A cranial nerve deficit and sensory deficit is present. He exhibits abnormal muscle tone. Coordination and gait abnormal.  Skin: Skin is warm, dry and  intact. He is not diaphoretic. No cyanosis. No pallor. Nails show no clubbing.  Psychiatric: He has a normal mood and affect. His speech is normal and behavior is normal. Judgment and thought content normal. Cognition and memory are normal.  Nursing note and vitals reviewed.   Labs reviewed: No results for input(s): NA, K, CL, CO2, GLUCOSE, BUN, CREATININE, CALCIUM, MG, PHOS in the last 8760 hours. No results for input(s): AST, ALT, ALKPHOS, BILITOT, PROT, ALBUMIN in the last 8760 hours. No results for input(s): WBC, NEUTROABS, HGB, HCT, MCV, PLT in the last 8760 hours. Lab Results  Component Value Date   TSH 3.385 12/30/2014   Lab Results  Component Value Date   HGBA1C 5.7 (H) 12/25/2014   No results found for: CHOL, HDL, LDLCALC, LDLDIRECT, TRIG, CHOLHDL  Significant Diagnostic Results in last 30 days:  No results found.  Assessment/Plan Paroxysmal atrial fibrillation (HCC)  Hypotension, unspecified hypotension type  Atherosclerosis of native coronary artery of native heart with angina pectoris (HCC)   Above-mentioned condition stable  Continue current medication regimen  Follow-up with cardiology as needed  Out of bed to chair as much as possible   Family/ staff Communication:   Total Time:  Documentation:  Face to Face:  Family/Phone:   Labs/tests ordered: None  Medication list reviewed and assessed for continued appropriateness. Monthly medication orders reviewed and signed.  Vikki Ports, NP-C Geriatrics [facility] [phone], [place] [address] Cell Phone (Mon-Fri 8am-5pm):  [phone] On Call:  [phone] & follow prompts after 5pm & weekends Office Phone:  [phone] Office Fax:  [phone]

## 2017-06-08 NOTE — Assessment & Plan Note (Signed)
Stable.  No complaints of chest pain/tightness or angina.

## 2017-06-08 NOTE — Progress Notes (Signed)
This encounter was created in error - please disregard.

## 2017-06-08 NOTE — Assessment & Plan Note (Signed)
Stable.  No recent episodes of hypotension or orthostasis.  Symptoms managed on Midodrin 5 mg 3 times daily.  No complaints of chest pain or shortness of breath.

## 2017-06-08 NOTE — Assessment & Plan Note (Signed)
Stable.  No recent exacerbations.  Symptoms controlled without medication.

## 2017-06-21 DIAGNOSIS — L219 Seborrheic dermatitis, unspecified: Secondary | ICD-10-CM | POA: Diagnosis not present

## 2017-06-21 DIAGNOSIS — L821 Other seborrheic keratosis: Secondary | ICD-10-CM | POA: Diagnosis not present

## 2017-06-21 DIAGNOSIS — L570 Actinic keratosis: Secondary | ICD-10-CM | POA: Diagnosis not present

## 2017-06-21 DIAGNOSIS — L820 Inflamed seborrheic keratosis: Secondary | ICD-10-CM | POA: Diagnosis not present

## 2017-06-21 DIAGNOSIS — L578 Other skin changes due to chronic exposure to nonionizing radiation: Secondary | ICD-10-CM | POA: Diagnosis not present

## 2017-06-22 ENCOUNTER — Encounter
Admission: RE | Admit: 2017-06-22 | Discharge: 2017-06-22 | Disposition: A | Discharge status: Home / Self Care | Payer: PPO | Source: Ambulatory Visit | Attending: Internal Medicine | Admitting: Internal Medicine

## 2017-06-27 ENCOUNTER — Encounter: Payer: Self-pay | Admitting: Gerontology

## 2017-06-27 ENCOUNTER — Non-Acute Institutional Stay (SKILLED_NURSING_FACILITY): Payer: Medicare Other | Admitting: Gerontology

## 2017-06-27 DIAGNOSIS — C189 Malignant neoplasm of colon, unspecified: Secondary | ICD-10-CM

## 2017-06-27 DIAGNOSIS — E039 Hypothyroidism, unspecified: Secondary | ICD-10-CM | POA: Diagnosis not present

## 2017-06-27 DIAGNOSIS — K219 Gastro-esophageal reflux disease without esophagitis: Secondary | ICD-10-CM | POA: Diagnosis not present

## 2017-07-20 ENCOUNTER — Non-Acute Institutional Stay (SKILLED_NURSING_FACILITY): Payer: Medicare Other | Admitting: Gerontology

## 2017-07-20 ENCOUNTER — Encounter
Admission: RE | Admit: 2017-07-20 | Discharge: 2017-07-20 | Disposition: A | Discharge status: Home / Self Care | Payer: PPO | Source: Ambulatory Visit | Attending: Internal Medicine | Admitting: Internal Medicine

## 2017-07-20 DIAGNOSIS — K567 Ileus, unspecified: Secondary | ICD-10-CM | POA: Diagnosis not present

## 2017-07-21 ENCOUNTER — Inpatient Hospital Stay
Admission: EM | Admit: 2017-07-21 | Discharge: 2017-08-20 | DRG: 871 | Disposition: E | Payer: PPO | Attending: Family Medicine | Admitting: Family Medicine

## 2017-07-21 ENCOUNTER — Inpatient Hospital Stay: Payer: PPO

## 2017-07-21 ENCOUNTER — Encounter: Payer: Self-pay | Admitting: Emergency Medicine

## 2017-07-21 ENCOUNTER — Emergency Department: Payer: PPO

## 2017-07-21 ENCOUNTER — Other Ambulatory Visit: Payer: Self-pay

## 2017-07-21 DIAGNOSIS — J811 Chronic pulmonary edema: Secondary | ICD-10-CM | POA: Diagnosis not present

## 2017-07-21 DIAGNOSIS — J90 Pleural effusion, not elsewhere classified: Secondary | ICD-10-CM | POA: Diagnosis not present

## 2017-07-21 DIAGNOSIS — E872 Acidosis: Secondary | ICD-10-CM | POA: Diagnosis not present

## 2017-07-21 DIAGNOSIS — Z8249 Family history of ischemic heart disease and other diseases of the circulatory system: Secondary | ICD-10-CM | POA: Diagnosis not present

## 2017-07-21 DIAGNOSIS — E039 Hypothyroidism, unspecified: Secondary | ICD-10-CM | POA: Diagnosis not present

## 2017-07-21 DIAGNOSIS — D510 Vitamin B12 deficiency anemia due to intrinsic factor deficiency: Secondary | ICD-10-CM | POA: Diagnosis present

## 2017-07-21 DIAGNOSIS — Y95 Nosocomial condition: Secondary | ICD-10-CM | POA: Diagnosis present

## 2017-07-21 DIAGNOSIS — F329 Major depressive disorder, single episode, unspecified: Secondary | ICD-10-CM | POA: Diagnosis present

## 2017-07-21 DIAGNOSIS — Z66 Do not resuscitate: Secondary | ICD-10-CM | POA: Diagnosis not present

## 2017-07-21 DIAGNOSIS — Z9889 Other specified postprocedural states: Secondary | ICD-10-CM | POA: Diagnosis not present

## 2017-07-21 DIAGNOSIS — J9601 Acute respiratory failure with hypoxia: Secondary | ICD-10-CM | POA: Diagnosis not present

## 2017-07-21 DIAGNOSIS — I10 Essential (primary) hypertension: Secondary | ICD-10-CM | POA: Diagnosis not present

## 2017-07-21 DIAGNOSIS — A419 Sepsis, unspecified organism: Secondary | ICD-10-CM | POA: Diagnosis not present

## 2017-07-21 DIAGNOSIS — J939 Pneumothorax, unspecified: Secondary | ICD-10-CM | POA: Diagnosis not present

## 2017-07-21 DIAGNOSIS — K210 Gastro-esophageal reflux disease with esophagitis: Secondary | ICD-10-CM | POA: Diagnosis present

## 2017-07-21 DIAGNOSIS — F419 Anxiety disorder, unspecified: Secondary | ICD-10-CM | POA: Diagnosis present

## 2017-07-21 DIAGNOSIS — J9602 Acute respiratory failure with hypercapnia: Secondary | ICD-10-CM | POA: Diagnosis not present

## 2017-07-21 DIAGNOSIS — Z85038 Personal history of other malignant neoplasm of large intestine: Secondary | ICD-10-CM

## 2017-07-21 DIAGNOSIS — Z8546 Personal history of malignant neoplasm of prostate: Secondary | ICD-10-CM | POA: Diagnosis not present

## 2017-07-21 DIAGNOSIS — Z79890 Hormone replacement therapy (postmenopausal): Secondary | ICD-10-CM

## 2017-07-21 DIAGNOSIS — Z951 Presence of aortocoronary bypass graft: Secondary | ICD-10-CM

## 2017-07-21 DIAGNOSIS — I2510 Atherosclerotic heart disease of native coronary artery without angina pectoris: Secondary | ICD-10-CM | POA: Diagnosis present

## 2017-07-21 DIAGNOSIS — Z888 Allergy status to other drugs, medicaments and biological substances status: Secondary | ICD-10-CM

## 2017-07-21 DIAGNOSIS — H9190 Unspecified hearing loss, unspecified ear: Secondary | ICD-10-CM | POA: Diagnosis present

## 2017-07-21 DIAGNOSIS — Z9849 Cataract extraction status, unspecified eye: Secondary | ICD-10-CM | POA: Diagnosis not present

## 2017-07-21 DIAGNOSIS — I480 Paroxysmal atrial fibrillation: Secondary | ICD-10-CM | POA: Diagnosis present

## 2017-07-21 DIAGNOSIS — K5641 Fecal impaction: Secondary | ICD-10-CM | POA: Diagnosis not present

## 2017-07-21 DIAGNOSIS — J189 Pneumonia, unspecified organism: Secondary | ICD-10-CM | POA: Diagnosis not present

## 2017-07-21 DIAGNOSIS — Z9981 Dependence on supplemental oxygen: Secondary | ICD-10-CM

## 2017-07-21 DIAGNOSIS — R140 Abdominal distension (gaseous): Secondary | ICD-10-CM

## 2017-07-21 DIAGNOSIS — Z7189 Other specified counseling: Secondary | ICD-10-CM | POA: Diagnosis not present

## 2017-07-21 DIAGNOSIS — Z515 Encounter for palliative care: Secondary | ICD-10-CM | POA: Diagnosis present

## 2017-07-21 DIAGNOSIS — Z79899 Other long term (current) drug therapy: Secondary | ICD-10-CM

## 2017-07-21 DIAGNOSIS — E785 Hyperlipidemia, unspecified: Secondary | ICD-10-CM | POA: Diagnosis not present

## 2017-07-21 DIAGNOSIS — I351 Nonrheumatic aortic (valve) insufficiency: Secondary | ICD-10-CM | POA: Diagnosis not present

## 2017-07-21 DIAGNOSIS — R0602 Shortness of breath: Secondary | ICD-10-CM | POA: Diagnosis not present

## 2017-07-21 DIAGNOSIS — H409 Unspecified glaucoma: Secondary | ICD-10-CM | POA: Diagnosis present

## 2017-07-21 DIAGNOSIS — J690 Pneumonitis due to inhalation of food and vomit: Secondary | ICD-10-CM | POA: Diagnosis not present

## 2017-07-21 LAB — CBC WITH DIFFERENTIAL/PLATELET
BASOS ABS: 0 10*3/uL (ref 0–0.1)
Basophils Relative: 0 %
EOS ABS: 0 10*3/uL (ref 0–0.7)
Eosinophils Relative: 0 %
HCT: 49.6 % (ref 40.0–52.0)
Hemoglobin: 16.1 g/dL (ref 13.0–18.0)
Lymphocytes Relative: 19 %
Lymphs Abs: 3.9 10*3/uL — ABNORMAL HIGH (ref 1.0–3.6)
MCH: 30.5 pg (ref 26.0–34.0)
MCHC: 32.5 g/dL (ref 32.0–36.0)
MCV: 93.9 fL (ref 80.0–100.0)
Monocytes Absolute: 1.5 10*3/uL — ABNORMAL HIGH (ref 0.2–1.0)
Monocytes Relative: 7 %
Neutro Abs: 15 10*3/uL — ABNORMAL HIGH (ref 1.4–6.5)
Neutrophils Relative %: 74 %
PLATELETS: 245 10*3/uL (ref 150–440)
RBC: 5.28 MIL/uL (ref 4.40–5.90)
RDW: 16.9 % — ABNORMAL HIGH (ref 11.5–14.5)
WBC: 20.4 10*3/uL — AB (ref 3.8–10.6)

## 2017-07-21 LAB — BLOOD GAS, ARTERIAL
Acid-base deficit: 4 mmol/L — ABNORMAL HIGH (ref 0.0–2.0)
Acid-base deficit: 5.8 mmol/L — ABNORMAL HIGH (ref 0.0–2.0)
BICARBONATE: 20.9 mmol/L (ref 20.0–28.0)
BICARBONATE: 24.3 mmol/L (ref 20.0–28.0)
Delivery systems: POSITIVE
Delivery systems: POSITIVE
EXPIRATORY PAP: 6
EXPIRATORY PAP: 6
FIO2: 1
FIO2: 100
INSPIRATORY PAP: 14
Inspiratory PAP: 14
Mechanical Rate: 8
O2 SAT: 93.4 %
O2 SAT: 98.8 %
PATIENT TEMPERATURE: 37
PCO2 ART: 65 mmHg — AB (ref 32.0–48.0)
PO2 ART: 129 mmHg — AB (ref 83.0–108.0)
PO2 ART: 85 mmHg (ref 83.0–108.0)
Patient temperature: 37
pCO2 arterial: 37 mmHg (ref 32.0–48.0)
pH, Arterial: 7.18 — CL (ref 7.350–7.450)
pH, Arterial: 7.36 (ref 7.350–7.450)

## 2017-07-21 LAB — COMPREHENSIVE METABOLIC PANEL
ALBUMIN: 4 g/dL (ref 3.5–5.0)
ALT: 29 U/L (ref 17–63)
AST: 56 U/L — AB (ref 15–41)
Alkaline Phosphatase: 147 U/L — ABNORMAL HIGH (ref 38–126)
Anion gap: 12 (ref 5–15)
BUN: 25 mg/dL — AB (ref 6–20)
CHLORIDE: 106 mmol/L (ref 101–111)
CO2: 24 mmol/L (ref 22–32)
Calcium: 9.1 mg/dL (ref 8.9–10.3)
Creatinine, Ser: 0.92 mg/dL (ref 0.61–1.24)
GFR calc Af Amer: >60 mL/min (ref >60)
GFR calc non Af Amer: >60 mL/min (ref >60)
Glucose, Bld: 192 mg/dL — ABNORMAL HIGH (ref 65–99)
Potassium: 4.2 mmol/L (ref 3.5–5.1)
SODIUM: 142 mmol/L (ref 135–145)
Total Bilirubin: 2.2 mg/dL — ABNORMAL HIGH (ref 0.3–1.2)
Total Protein: 7.8 g/dL (ref 6.5–8.1)

## 2017-07-21 LAB — TROPONIN I: Troponin I: 0.03 ng/mL (ref <0.03)

## 2017-07-21 LAB — PROCALCITONIN: PROCALCITONIN: 0.11 ng/mL

## 2017-07-21 LAB — GLUCOSE, CAPILLARY: Glucose-Capillary: 115 mg/dL — ABNORMAL HIGH (ref 65–99)

## 2017-07-21 LAB — LACTIC ACID, PLASMA
LACTIC ACID, VENOUS: 1.9 mmol/L (ref 0.5–1.9)
Lactic Acid, Venous: 3.4 mmol/L (ref 0.5–1.9)

## 2017-07-21 MED ORDER — MORPHINE SULFATE (CONCENTRATE) 10 MG/0.5ML PO SOLN
10.0000 mg | ORAL | Status: DC | PRN
  Administered 2017-07-24 – 2017-07-25 (×3): 10 mg via ORAL
  Filled 2017-07-21 (×3): qty 1

## 2017-07-21 MED ORDER — ACETAMINOPHEN 650 MG RE SUPP: 650.0000 mg | Freq: Four times a day (QID) | RECTAL | Status: DC | PRN

## 2017-07-21 MED ORDER — BRIMONIDINE TARTRATE 0.15 % OP SOLN
1.0000 [drp] | Freq: Two times a day (BID) | OPHTHALMIC | Status: DC
Start: 2017-07-21 — End: 2017-07-26
  Administered 2017-07-21 – 2017-07-25 (×9): 1 [drp] via OPHTHALMIC
  Filled 2017-07-21: qty 5

## 2017-07-21 MED ORDER — ONDANSETRON HCL 4 MG/2ML IJ SOLN: 4.0000 mg | Freq: Four times a day (QID) | INTRAMUSCULAR | Status: DC | PRN

## 2017-07-21 MED ORDER — BISACODYL 10 MG RE SUPP
10.0000 mg | Freq: Every day | RECTAL | Status: DC | PRN
  Filled 2017-07-21: qty 1

## 2017-07-21 MED ORDER — IPRATROPIUM-ALBUTEROL 0.5-2.5 (3) MG/3ML IN SOLN
3.0000 mL | Freq: Four times a day (QID) | RESPIRATORY_TRACT | Status: DC
  Administered 2017-07-21 – 2017-07-22 (×3): 3 mL via RESPIRATORY_TRACT
  Filled 2017-07-21: qty 3
  Filled 2017-07-21: qty 6

## 2017-07-21 MED ORDER — VANCOMYCIN HCL IN DEXTROSE 1-5 GM/200ML-% IV SOLN
1000.0000 mg | Freq: Once | INTRAVENOUS | Status: AC
  Administered 2017-07-21: 1000 mg via INTRAVENOUS
  Filled 2017-07-21: qty 200

## 2017-07-21 MED ORDER — SODIUM CHLORIDE 0.9 % IV BOLUS (SEPSIS)
500.0000 mL | Freq: Once | INTRAVENOUS | Status: AC
  Administered 2017-07-21: 500 mL via INTRAVENOUS

## 2017-07-21 MED ORDER — LEVOTHYROXINE SODIUM 100 MCG IV SOLR
25.0000 ug | Freq: Every day | INTRAVENOUS | Status: DC
  Administered 2017-07-22 – 2017-07-26 (×5): 25 ug via INTRAVENOUS
  Filled 2017-07-21 (×5): qty 5

## 2017-07-21 MED ORDER — TIMOLOL MALEATE 0.5 % OP SOLN
1.0000 [drp] | Freq: Two times a day (BID) | OPHTHALMIC | Status: DC
Start: 2017-07-21 — End: 2017-07-26
  Administered 2017-07-21 – 2017-07-25 (×9): 1 [drp] via OPHTHALMIC
  Filled 2017-07-21: qty 5

## 2017-07-21 MED ORDER — HYDROCORTISONE 1 % EX CREA
1.0000 "application " | TOPICAL_CREAM | Freq: Four times a day (QID) | CUTANEOUS | Status: DC | PRN
  Filled 2017-07-21: qty 28

## 2017-07-21 MED ORDER — BRIMONIDINE TARTRATE 0.2 % OP SOLN
1.0000 [drp] | Freq: Two times a day (BID) | OPHTHALMIC | Status: DC
  Filled 2017-07-21: qty 5

## 2017-07-21 MED ORDER — SODIUM CHLORIDE 0.9 % IV BOLUS (SEPSIS)
1000.0000 mL | Freq: Once | INTRAVENOUS | Status: AC
  Administered 2017-07-21: 1000 mL via INTRAVENOUS

## 2017-07-21 MED ORDER — LATANOPROST 0.005 % OP SOLN
1.0000 [drp] | Freq: Every day | OPHTHALMIC | Status: DC
  Administered 2017-07-22 – 2017-07-25 (×4): 1 [drp] via OPHTHALMIC

## 2017-07-21 MED ORDER — ONDANSETRON HCL 4 MG PO TABS: 4.0000 mg | ORAL_TABLET | Freq: Four times a day (QID) | ORAL | Status: DC | PRN

## 2017-07-21 MED ORDER — FLEET ENEMA 7-19 GM/118ML RE ENEM: 1.0000 | ENEMA | Freq: Every day | RECTAL | Status: DC | PRN

## 2017-07-21 MED ORDER — PANTOPRAZOLE SODIUM 40 MG IV SOLR
40.0000 mg | Freq: Two times a day (BID) | INTRAVENOUS | Status: DC
  Administered 2017-07-21 – 2017-07-25 (×8): 40 mg via INTRAVENOUS
  Filled 2017-07-21 (×8): qty 40

## 2017-07-21 MED ORDER — ACETAMINOPHEN 325 MG PO TABS
650.0000 mg | ORAL_TABLET | Freq: Four times a day (QID) | ORAL | Status: DC | PRN
  Administered 2017-07-25: 650 mg via ORAL
  Filled 2017-07-21: qty 2

## 2017-07-21 MED ORDER — TIMOLOL HEMIHYDRATE 0.5 % OP SOLN: 1.0000 [drp] | Freq: Two times a day (BID) | OPHTHALMIC | Status: DC

## 2017-07-21 MED ORDER — PIPERACILLIN-TAZOBACTAM 3.375 G IVPB 30 MIN
3.3750 g | Freq: Once | INTRAVENOUS | Status: AC
  Administered 2017-07-21: 3.375 g via INTRAVENOUS
  Filled 2017-07-21: qty 50

## 2017-07-21 MED ORDER — PIPERACILLIN-TAZOBACTAM 3.375 G IVPB
3.3750 g | Freq: Three times a day (TID) | INTRAVENOUS | Status: DC
  Administered 2017-07-22 – 2017-07-26 (×12): 3.375 g via INTRAVENOUS
  Filled 2017-07-21 (×12): qty 50

## 2017-07-21 MED ORDER — NYSTATIN 100000 UNIT/GM EX POWD
Freq: Two times a day (BID) | CUTANEOUS | Status: DC
  Administered 2017-07-21 – 2017-07-25 (×7): via TOPICAL
  Filled 2017-07-21: qty 15

## 2017-07-21 MED ORDER — ENOXAPARIN SODIUM 40 MG/0.4ML ~~LOC~~ SOLN
40.0000 mg | SUBCUTANEOUS | Status: DC
  Administered 2017-07-22 – 2017-07-26 (×5): 40 mg via SUBCUTANEOUS
  Filled 2017-07-21 (×5): qty 0.4

## 2017-07-21 MED ORDER — VANCOMYCIN HCL 10 G IV SOLR
1500.0000 mg | INTRAVENOUS | Status: DC
  Administered 2017-07-21 – 2017-07-22 (×2): 1500 mg via INTRAVENOUS
  Filled 2017-07-21 (×3): qty 1500

## 2017-07-21 NOTE — Progress Notes (Signed)
CODE SEPSIS - PHARMACY COMMUNICATION  **Broad Spectrum Antibiotics should be administered within 1 hour of Sepsis diagnosis**  Time Code Sepsis Called/Page Received: 1907  Antibiotics Ordered: vanc zosyn  Time of 1st antibiotic administration: 1912  Additional action taken by pharmacy: none  If necessary, Name of Provider/Nurse Contacted: n/a    Thomasenia Sales ,PharmD Clinical Pharmacist  07/25/2017  7:45 PM

## 2017-07-21 NOTE — ED Provider Notes (Signed)
[facility] Emergency Department Provider Note  ____________________________________________  Time seen: Approximately 7:44 PM  I have reviewed the triage vital signs and the nursing notes.   HISTORY  Chief Complaint Aspiration  Level 5 caveat:  Portions of the history and physical were unable to be obtained due to: Acute critical illness, patient unable to provide history   HPI Jacob Reyes is a 84 y.o. male brought to the ED via EMS due to respiratory distress.Patient reports shortness of breath. Denies any pain.  EMS report the patient is supposed to be on thickened liquids for his diet. However, the patient was fed soup today that was not thickened. Immediately afterward, he started coughing and then having difficulty breathing. His initial oxygen saturation was about 40% when he was assessed. EMS report that on their arrival, patient was on nasal cannula provided by [facility], and had an oxygen saturation of 70%. They started CPAP and transported to the hospital.  Family reports that the patient has been receiving unthickened liquids for a few days in a row.     Past Medical History:  Diagnosis Date  . Abnormal stress test   . Acquired hypothyroidism    unspecified  . AF (paroxysmal atrial fibrillation) ([place])   . Anxiety    just started med. for anxiety   . Cancer [facility])    prostate  . Colon cancer ([place]) 11/16/2013   partial colectomy  . Coronary artery disease   . Depression    unspecified  . GERD (gastroesophageal reflux disease)   . Glaucoma   . Hearing loss   . History of hiatal hernia   . Hyperlipidemia 11/16/2013  . Hypertension   . Pernicious anemia   . Prostate cancer ([place]) 11/16/2013   1/8 bx pos, waiting  . Reflux   . Reflux esophagitis 11/16/2013     Patient Active Problem List   Diagnosis Date Noted  . Dry eye syndrome 03/07/2017  . Paroxysmal atrial fibrillation ([place]) 01/01/2017  . Hypo-osmolality  and hyponatremia 01/01/2017  . Retention of urine, unspecified 01/01/2017  . Hypothyroidism 01/01/2017  . Unspecified glaucoma 01/01/2017  . Personal history of malignant neoplasm of prostate 01/01/2017  . Hypotension, unspecified 01/01/2017  . Muscle weakness (generalized) 01/01/2017  . Major depressive disorder, single episode, unspecified 01/01/2017  . Anxiety disorder 01/01/2017  . Dyspnea 01/01/2017  . Actinic keratitis 11/08/2016  . Poor appetite 08/21/2016  . Parkinson disease ([place]) 08/21/2016  . Dyslipidemia 01/02/2015  . Gastroesophageal reflux disease without esophagitis 01/02/2015  . S/P CABG x 6 12/28/2014  . Atherosclerotic heart disease of native coronary artery with angina pectoris ([place]) 12/10/2014  . Cancer of colon- remote surgery 11/16/2013     Past Surgical History:  Procedure Laterality Date  . CARDIAC CATHETERIZATION N/A 12/10/2014   Procedure: Left Heart Cath and Coronary Angiography;  Surgeon: Teodoro Spray, MD;  Location: [place] CV LAB;  Service: Cardiovascular;  Laterality: N/A;  . CATARACT EXTRACTION    . COLON SURGERY    . COLONOSCOPY    . CORONARY ARTERY BYPASS GRAFT N/A 12/28/2014   Procedure: CORONARY ARTERY BYPASS GRAFTING (CABG), ON PUMP, TIMES SIX, USING LEFT INTERNAL MAMMARY ARTERY, RIGHT GREATER SAPHENOUS VEIN HARVESTED ENDOSCOPICALLY;  Surgeon: [name], MD;  Location: [place];  Service: Open Heart Surgery;  Laterality: N/A;  -SEQ LIMA to MID LAD and DISTAL LAD -SVG to DIAGONAL -SEQ SVG to OM1 and OM2 -SVG to PDA  . EYE SURGERY  catarcats removed bilataeral /w IOL  . FOOT SURGERY Right 2009   Shorten metatarsal  . HERNIA REPAIR Bilateral 1991   inguinal   . PARTIAL COLECTOMY     WITH ANANSTOMSIS  . TEE WITHOUT CARDIOVERSION N/A 12/28/2014   Procedure: TRANSESOPHAGEAL ECHOCARDIOGRAM (TEE);  Surgeon: [name], MD;  Location: [place];  Service: Open Heart Surgery;  Laterality: N/A;  . TONSILLECTOMY       Prior to  Admission medications   Medication Sig Start Date End Date Taking? Authorizing Provider  acetaminophen (TYLENOL) 500 MG tablet Take 500 mg by mouth every 6 (six) hours as needed.   Yes [provider]  alum & mag hydroxide-simeth (MAALOX PLUS) 400-400-40 MG/5ML suspension Take 30 mLs by mouth every 4 (four) hours as needed for indigestion.   Yes [provider]  benzonatate (TESSALON) 200 MG capsule Take 200 mg by mouth every 8 (eight) hours as needed for cough.   Yes [provider]  bisacodyl (DULCOLAX) 10 MG suppository Place 10 mg rectally daily as needed.   Yes [provider]  brimonidine (ALPHAGAN) 0.2 % ophthalmic solution Place 1 drop into both eyes 2 (two) times daily.   Yes [provider]  Dextromethorphan-Guaifenesin (GERI-TUSSIN DM) 10-100 MG/5ML liquid Take 5-10 mLs by mouth every 4 (four) hours as needed.   Yes [provider]  ENSURE (ENSURE) Take 1 Can by mouth 2 (two) times daily between meals. ONLY GIVE IF HE IS NOT EATING. Family will provide the ensure.   Yes [provider]  gluconic acid-citric acid (RENACIDIN) irrigation Irrigate with 30 mLs as directed 2 (two) times daily. Clamp tubing for 30 minutes then unclamp and allow to flow   Yes [provider]  hydrocortisone cream 1 % Apply 1 application topically 4 (four) times daily as needed for itching. Apply to clean, dry hemorrhoids. Ok to change order to prn when resolved or pt request.   Yes [provider]  hydrocortisone cream 1 % Apply 1 application topically every Monday, Wednesday, and Friday. Apply to right ear    Yes [provider]  ipratropium-albuterol (DUONEB) 0.5-2.5 (3) MG/3ML SOLN Take 3 mLs by nebulization 4 (four) times daily as needed.   Yes [provider]  latanoprost (XALATAN) 0.005 % ophthalmic solution Place 1 drop into both eyes at bedtime. Outside pharmacy wife will provide them   Yes [provider]  levothyroxine (SYNTHROID, LEVOTHROID) 75 MCG tablet Take 75 mcg by mouth daily before breakfast.   Yes [provider]  LUMIGAN 0.01 % SOLN Place 1 drop into both eyes at bedtime. Wife will supply from outside pharmacy. Let wife know supply is low. 06/03/13  Yes [provider]  magnesium hydroxide (MILK OF MAGNESIA) 400 MG/5ML suspension Take 30 mLs by mouth every 4 (four) hours as needed for mild constipation. If constipation / no BM for 2 days   Yes [provider]  metoCLOPramide (REGLAN) 5 MG/5ML solution Take 5 mg by mouth every 6 (six) hours.   Yes [provider]  midodrine (PROAMATINE) 5 MG tablet Take 5 mg by mouth 3 (three) times daily with meals.   Yes [provider]  mirtazapine (REMERON) 15 MG tablet Take 15 mg by mouth at bedtime.   Yes [provider]  morphine (ROXANOL) 20 MG/ML concentrated solution Give 0.25 ml -0.5 ml by mouth every hour as needed for mild to severe pain. 0.25 = mild pain 0.5 = severe pain  Yes [provider]  nystatin (MYCOSTATIN/NYSTOP) powder Apply thin film topically to groin rash 2 times daily as needed until healed   Yes [provider]  omeprazole (PRILOSEC) 20 MG capsule Take 20 mg by mouth daily before breakfast.  06/10/13  Yes [provider]  OXYGEN Inhale 2-5 L into the lungs continuous. Titrate to maintain sats > 90 % and/or for comfort   Yes [provider]  polyethylene glycol powder (GLYCOLAX/MIRALAX) powder Take 17 g by mouth daily as needed.  06/18/13  Yes [provider]  senna-docusate (SENNA-PLUS) 8.6-50 MG tablet Take 1-2 tablets by mouth 2 (two) times daily as needed.   Yes [provider]  sertraline (ZOLOFT) 100 MG tablet Take 100 mg by mouth daily.   Yes [provider]  sodium chloride 0.9 % infusion Inject 50 mLs into the vein continuous. Give 250 ML bolus over 1 hour, then continuous at 50 ML/ hour x 72 hours for  ileus. 07/19/17 07/22/17 Yes [provider]  sorbitol 70 % solution Take 30 mLs by mouth daily as needed. Give every 2 hours until large BM, then change med to as needed   Yes [provider]  timolol (BETIMOL) 0.5 % ophthalmic solution Place 1 drop into both eyes 2 (two) times daily.   Yes [provider]  WHITE PETROLATUM-MINERAL OIL OP Place 1/4 ribbon in both eyes at bedtime at least 30 minutes after placing drops for glaucoma (for dry eyes)   Yes [provider]  Zinc Oxide (BOUDREAUXS BUTT PASTE) 16 % OINT Apply liberal amount topically to area of skin irritation as needed. Ok to leave at bedside.   Yes [provider]     Allergies Carvedilol; Duloxetine; Paroxetine hcl; and Zaleplon   Family History  Problem Relation Age of Onset  . Coronary artery disease Mother   . Heart attack Mother   . Coronary artery disease Father        coronary thrombosis  . Heart attack Father   . Alzheimer's disease Sister   . Glaucoma Sister   . Coronary artery disease Brother   . Heart attack Brother   . Sleep disorder Brother   . Cancer Maternal Grandfather   . Pancreatic cancer Sister     Social History Social History   Tobacco Use  . Smoking status: Never Smoker  . Smokeless tobacco: Never Used  Substance Use Topics  . Alcohol use: No  . Drug use: No    Review of Systems Unable to reliably obtained due to critical illness and inability to provide history. ____________________________________________   PHYSICAL EXAM:  VITAL SIGNS: ED Triage Vitals  Enc Vitals Group     BP 07/28/2017 1845 136/84     Pulse Rate 08/10/2017 1845 80     Resp 07/27/2017 1845 (!) 28     Temp --      Temp src --      SpO2 07/25/2017 1844 98 %     Weight 08/08/2017 1847 176 lb (79.8 kg)     Height --      Head Circumference --      Peak Flow --      Pain Score --      Pain Loc --      Pain Edu? --      Excl. in [place]? --     Vital signs reviewed, nursing  assessments reviewed.   Constitutional:   Awake, respiratory distress. Eyes:   No scleral icterus.  EOMI. PERRL. ENT   Head:   Normocephalic and atraumatic.   Nose:   No congestion/rhinnorhea.    Mouth/Throat:   MMM.    Neck:   No meningismus. Full ROM. Hematological/Lymphatic/Immunilogical:   No cervical lymphadenopathy. Cardiovascular:   Tachycardia heart rate 120. Symmetric bilateral radial and DP pulses.  No murmurs.  Respiratory:   Increased work of breathing and tachypnea, respiratory rate of 45 on arrival. Clear breath sounds on the right. Diminished breath sounds in the left lower lung.. Gastrointestinal:   Soft and nontender. Non distended. There is no CVA tenderness.  No rebound, rigidity, or guarding. Genitourinary:   deferred Musculoskeletal:   Normal range of motion in all extremities. No joint effusions.  No lower extremity tenderness.  No edema. Neurologic:    Limited exam due to patient's critical illness and inability to participate in a comprehensive exam Motor grossly intact. Skin:    Skin is warm, dry and intact. No rash noted.  No petechiae, purpura, or bullae.  ____________________________________________    LABS (pertinent positives/negatives) (all labs ordered are listed, but only abnormal results are displayed) Labs Reviewed  BLOOD GAS, ARTERIAL - Abnormal; Notable for the following components:      Result Value   pH, Arterial 7.18 (*)    pCO2 arterial 65 (*)    Acid-base deficit 5.8 (*)    All other components within normal limits  CBC WITH DIFFERENTIAL/PLATELET - Abnormal; Notable for the following components:   WBC 20.4 (*)    RDW 16.9 (*)    Neutro Abs 15.0 (*)    Lymphs Abs 3.9 (*)    Monocytes Absolute 1.5 (*)    All other components within normal limits  BLOOD GAS, ARTERIAL - Abnormal; Notable for the following components:   pO2, Arterial 129 (*)    Acid-base deficit 4.0 (*)    All other components within normal limits  CULTURE,  BLOOD (ROUTINE X 2)  CULTURE, BLOOD (ROUTINE X 2)  COMPREHENSIVE METABOLIC PANEL  LACTIC ACID, PLASMA  LACTIC ACID, PLASMA  TROPONIN I   ____________________________________________   EKG  Interpreted by me Sinus tachycardia rate 112, normal axis intervals QRS ST segments and T waves.  ____________________________________________    [id number]  Dg Chest Portable 1 View  Result Date: 08/08/2017 CLINICAL DATA:  84 year old male with history of aspiration. EXAM: PORTABLE CHEST 1 VIEW COMPARISON:  Chest x-ray 09/15/2015. FINDINGS: Complete opacification throughout the mid to lower left hemithorax, compatible with large left pleural effusion with underlying atelectasis and/or airspace consolidation. Probable subsegmental atelectasis at the right lung base. Low lung volumes. No evidence of pulmonary edema. Cardiac silhouette is partially obscured but heart size appears normal. The patient is rotated to the left on today's exam, resulting in distortion of the mediastinal contours and reduced diagnostic sensitivity and specificity for mediastinal pathology. Atherosclerosis in the thoracic aorta. Status post median sternotomy for CABG. IMPRESSION: 1. Large left pleural effusion. 2. Atelectasis and/or airspace consolidation throughout the left mid to lower lung. 3. Right lower lobe subsegmental atelectasis. 4. Aortic atherosclerosis. Electronically Signed   By: Vinnie Langton M.D.   On: 08/03/2017 19:44    ____________________________________________   PROCEDURES .Critical Care Performed by: Carrie Mew, MD Authorized by: Carrie Mew, MD   Critical care provider statement:    Critical care time (minutes):  45   Critical care time was exclusive of:  Separately billable procedures and treating other patients   Critical care was necessary to treat or prevent imminent or life-threatening  deterioration of the following conditions:  Respiratory failure, sepsis and shock   Critical  care was time spent personally by me on the following activities:  Development of treatment plan with patient or surrogate, discussions with consultants, evaluation of patient's response to treatment, examination of patient, obtaining history from patient or surrogate, ordering and performing treatments and interventions, ordering and review of laboratory studies, ordering and review of radiographic studies, pulse oximetry, re-evaluation of patient's condition and review of old charts    ____________________________________________    CLINICAL IMPRESSION / [place] / ED COURSE  Pertinent labs & imaging results that were available during my care of the patient were reviewed by me and considered in my medical decision making (see chart for details).     Clinical Course as of Jul 21 2029  Sat Jul 21, 2017  1852 Pt arrived in resp distress.  Inititaed bipap immdiately. Has DNR yellow sheet. Called multiple family members to clarify if he would want intubation, which I believe he needs to save his life during this hospitalization event.  They will arrive in ED very soon but I was unable to reach his spouse. Daughter felt unable to answer. I will honor DNR in the meantime until informed otherwise.  [PS]  57 Conversation with spouse and son at bedside. They both agreed that if he has a potentially reversible critical illness, he would want to be intubated if necessary to save his life if he could potentially be weaned from the vent after he recovered.  [PS]  1905 Code sepsis initiated. There was a delay in initiating code sepsis due to necessity of clarifying CODE STATUS and providing critical care to the patient. I don't believe he is currently septic, but is at high risk of developing a severe infection, and presents with tachycardia of 120, tachypnea at 40, hypoxia of less than 70 on room air.   [PS]  1912 Initial abg showed resp acidosis. Currently still resp distress, RR 40. So2  good. Will repeat abg. If not improved, will need to intubate.   [PS]  [place] improved to 90. BP stable.   [PS]  1936 Repeat abg normalized. RR 35. Pt awake, alert. Will continue to monitor closely.   [PS]    Clinical Course User Index [PS] Carrie Mew, MD     ----------------------------------------- 8:31 PM on 08/14/2017 -----------------------------------------  Vital signs remain stable, respiratory rate 30. Discussed with the hospitalist for admission.  ____________________________________________   FINAL CLINICAL IMPRESSION(S) / ED DIAGNOSES    Final diagnoses:  Aspiration pneumonia of left lower lobe, unspecified aspiration pneumonia type ([place])  Acute respiratory failure with hypoxia and hypercapnia [facility])     ED Discharge Orders    None      Portions of this note were generated with dragon dictation software. Dictation errors may occur despite best attempts at proofreading.    Carrie Mew, MD 08/09/2017 2032

## 2017-07-21 NOTE — ED Notes (Signed)
Family updated on care plan. Family verbalizes understanding.

## 2017-07-21 NOTE — H&P (Signed)
[facility] [place] NAME: Jacob Reyes    MR#:  [id number]  DATE OF BIRTH:  Feb 23, 1926  DATE OF ADMISSION:  07/29/2017  PRIMARY CARE PHYSICIAN: Dr Frazier Richards   REQUESTING/REFERRING PHYSICIAN: Dr Eliott Nine  CHIEF COMPLAINT:   Chief Complaint  Patient presents with  . Aspiration    HISTORY OF PRESENT ILLNESS:  Jacob Reyes  is a 84 y.o. male with a known history of atrial fibrillation, coronary artery disease, gastroesophageal reflux disease, Parkinson-like syndrome.  He has been having a distended abdomen last few days.  They cannot recall when his last bowel movement was.  He has had nausea but no vomiting.  The patient was brought in and placed on BiPAP for acute respiratory failure.  There is a little air leak on the BiPAP.  The patient was acidotic in the ER.  Family at the bedside. The patient is on 100% on the BiPAP.  The patient is a DO NOT RESUSCITATE. Hospitalist services were contacted for further evaluation.  On my exam his abdomen was distended and hard.  I was unable to auscultate bowel sounds.  Family states that hospice had signed off on him recently.  PAST MEDICAL HISTORY:   Past Medical History:  Diagnosis Date  . Abnormal stress test   . Acquired hypothyroidism    unspecified  . AF (paroxysmal atrial fibrillation) ([place])   . Anxiety    just started med. for anxiety   . Cancer [facility])    prostate  . Colon cancer ([place]) 11/16/2013   partial colectomy  . Coronary artery disease   . Depression    unspecified  . GERD (gastroesophageal reflux disease)   . Glaucoma   . Hearing loss   . History of hiatal hernia   . Hyperlipidemia 11/16/2013  . Hypertension   . Pernicious anemia   . Prostate cancer ([place]) 11/16/2013   1/8 bx pos, waiting  . Reflux   . Reflux esophagitis 11/16/2013    PAST SURGICAL HISTORY:   Past Surgical History:  Procedure Laterality Date  . CARDIAC CATHETERIZATION N/A  12/10/2014   Procedure: Left Heart Cath and Coronary Angiography;  Surgeon: Teodoro Spray, MD;  Location: [facility];  Service: Cardiovascular;  Laterality: N/A;  . CATARACT EXTRACTION    . COLON SURGERY    . COLONOSCOPY    . CORONARY ARTERY BYPASS GRAFT N/A 12/28/2014   Procedure: CORONARY ARTERY BYPASS GRAFTING (CABG), ON PUMP, TIMES SIX, USING LEFT INTERNAL MAMMARY ARTERY, RIGHT GREATER SAPHENOUS VEIN HARVESTED ENDOSCOPICALLY;  Surgeon: [name], MD;  Location: [facility];  Service: Open Heart Surgery;  Laterality: N/A;  -SEQ LIMA to MID LAD and DISTAL LAD -SVG to DIAGONAL -SEQ SVG to OM1 and OM2 -SVG to PDA  . EYE SURGERY     catarcats removed bilataeral /w IOL  . FOOT SURGERY Right 2009   Shorten metatarsal  . HERNIA REPAIR Bilateral 1991   inguinal   . PARTIAL COLECTOMY     WITH ANANSTOMSIS  . TEE WITHOUT CARDIOVERSION N/A 12/28/2014   Procedure: TRANSESOPHAGEAL ECHOCARDIOGRAM (TEE);  Surgeon: [name], MD;  Location: [facility];  Service: Open Heart Surgery;  Laterality: N/A;  . TONSILLECTOMY      SOCIAL HISTORY:   Social History   Tobacco Use  . Smoking status: Never Smoker  . Smokeless tobacco: Never Used  Substance Use Topics  . Alcohol use: No    FAMILY HISTORY:   Family  History  Problem Relation Age of Onset  . Coronary artery disease Mother   . Heart attack Mother   . Coronary artery disease Father        coronary thrombosis  . Heart attack Father   . Alzheimer's disease Sister   . Glaucoma Sister   . Coronary artery disease Brother   . Heart attack Brother   . Sleep disorder Brother   . Cancer Maternal Grandfather   . Pancreatic cancer Sister     DRUG ALLERGIES:   Allergies  Allergen Reactions  . Carvedilol Other (See Comments)  . Duloxetine Other (See Comments)    Passing out  . Paroxetine Hcl Other (See Comments)    Hypotension  . Zaleplon Other (See Comments)    REVIEW OF SYSTEMS:  Very limited with the patient's  condition. The patient is hard of hearing. Gastrointestinal: Positive for nausea, constipation and abdominal distention pain  MEDICATIONS AT HOME:   Prior to Admission medications   Medication Sig Start Date End Date Taking? Authorizing Provider  acetaminophen (TYLENOL) 500 MG tablet Take 500 mg by mouth every 6 (six) hours as needed.   Yes [provider]  alum & mag hydroxide-simeth (MAALOX PLUS) 400-400-40 MG/5ML suspension Take 30 mLs by mouth every 4 (four) hours as needed for indigestion.   Yes [provider]  benzonatate (TESSALON) 200 MG capsule Take 200 mg by mouth every 8 (eight) hours as needed for cough.   Yes [provider]  bisacodyl (DULCOLAX) 10 MG suppository Place 10 mg rectally daily as needed.   Yes [provider]  brimonidine (ALPHAGAN) 0.2 % ophthalmic solution Place 1 drop into both eyes 2 (two) times daily.   Yes [provider]  Dextromethorphan-Guaifenesin (GERI-TUSSIN DM) 10-100 MG/5ML liquid Take 5-10 mLs by mouth every 4 (four) hours as needed.   Yes [provider]  ENSURE (ENSURE) Take 1 Can by mouth 2 (two) times daily between meals. ONLY GIVE IF HE IS NOT EATING. Family will provide the ensure.   Yes [provider]  gluconic acid-citric acid (RENACIDIN) irrigation Irrigate with 30 mLs as directed 2 (two) times daily. Clamp tubing for 30 minutes then unclamp and allow to flow   Yes [provider]  hydrocortisone cream 1 % Apply 1 application topically 4 (four) times daily as needed for itching. Apply to clean, dry hemorrhoids. Ok to change order to prn when resolved or pt request.   Yes [provider]  hydrocortisone cream 1 % Apply 1 application topically every Monday, Wednesday, and Friday. Apply to right ear    Yes [provider]  ipratropium-albuterol (DUONEB) 0.5-2.5 (3) MG/3ML SOLN Take 3 mLs by nebulization 4 (four) times daily as needed.   Yes [provider]  latanoprost (XALATAN) 0.005 % ophthalmic solution Place 1 drop into both eyes at bedtime. Outside pharmacy wife will provide them   Yes [provider]  levothyroxine (SYNTHROID, LEVOTHROID) 75 MCG tablet Take 75 mcg by mouth daily before breakfast.   Yes [provider]  LUMIGAN 0.01 % SOLN Place 1 drop into both eyes at bedtime. Wife will supply from outside pharmacy. Let wife know supply is low. 06/03/13  Yes [provider]  magnesium hydroxide (MILK OF MAGNESIA) 400 MG/5ML suspension Take 30 mLs by mouth every 4 (four) hours as needed for mild constipation. If constipation / no BM for 2 days   Yes [provider]  metoCLOPramide (REGLAN) 5 MG/5ML solution Take 5 mg  by mouth every 6 (six) hours.   Yes [provider]  midodrine (PROAMATINE) 5 MG tablet Take 5 mg by mouth 3 (three) times daily with meals.   Yes [provider]  mirtazapine (REMERON) 15 MG tablet Take 15 mg by mouth at bedtime.   Yes [provider]  morphine (ROXANOL) 20 MG/ML concentrated solution Give 0.25 ml -0.5 ml by mouth every hour as needed for mild to severe pain. 0.25 = mild pain 0.5 = severe pain   Yes [provider]  nystatin (MYCOSTATIN/NYSTOP) powder Apply thin film topically to groin rash 2 times daily as needed until healed   Yes [provider]  omeprazole (PRILOSEC) 20 MG capsule Take 20 mg by mouth daily before breakfast.  06/10/13  Yes [provider]  OXYGEN Inhale 2-5 L into the lungs continuous. Titrate to maintain sats > 90 % and/or for comfort   Yes [provider]  polyethylene glycol powder (GLYCOLAX/MIRALAX) powder Take 17 g by mouth daily as needed.  06/18/13  Yes [provider]  senna-docusate (SENNA-PLUS) 8.6-50 MG tablet Take 1-2 tablets by mouth 2 (two) times daily as needed.   Yes [provider]  sertraline (ZOLOFT) 100 MG tablet Take 100 mg by mouth daily.   Yes  [provider]  sodium chloride 0.9 % infusion Inject 50 mLs into the vein continuous. Give 250 ML bolus over 1 hour, then continuous at 50 ML/ hour x 72 hours for ileus. 07/19/17 07/22/17 Yes [provider]  sorbitol 70 % solution Take 30 mLs by mouth daily as needed. Give every 2 hours until large BM, then change med to as needed   Yes [provider]  timolol (BETIMOL) 0.5 % ophthalmic solution Place 1 drop into both eyes 2 (two) times daily.   Yes [provider]  WHITE PETROLATUM-MINERAL OIL OP Place 1/4 ribbon in both eyes at bedtime at least 30 minutes after placing drops for glaucoma (for dry eyes)   Yes [provider]  Zinc Oxide (BOUDREAUXS BUTT PASTE) 16 % OINT Apply liberal amount topically to area of skin irritation as needed. Ok to leave at bedside.   Yes [provider]      VITAL SIGNS:  Blood pressure (!) 123/104, pulse 84, temperature 98.6 F (37 C), temperature source Axillary, resp. rate (!) 24, weight 79.8 kg (176 lb), SpO2 100 %.  PHYSICAL EXAMINATION:  GENERAL:  84 y.o.-year-old patient lying in the bed with acute respiratory distress.  EYES: Pupils equal, round, reactive to light and accommodation. No scleral icterus. HEENT: Head atraumatic, normocephalic. Oropharynx and nasopharynx clear.  NECK:  Supple, no jugular venous distention. No thyroid enlargement, no tenderness.  LUNGS: Decreased breath sounds bilaterally, upper airway wheezing, rhonchi bilateral base. No use of accessory muscles of respiration.  CARDIOVASCULAR: S1, S2 irregularly irregular tachycardic. No murmurs, rubs, or gallops.  ABDOMEN: Soft, tender, distended. Bowel sounds absent. No organomegaly or mass.  EXTREMITIES: 2+ edema, no cyanosis, or clubbing.  NEUROLOGIC: Patient able to move his arms on his own.  Lethargic PSYCHIATRIC: The patient is lethargic.  SKIN:  Chronic lower extremity discoloration.  Bruising lower extremities and upper  extremities  LABORATORY PANEL:   CBC Recent Labs  Lab 08/08/2017 1857  WBC 20.4*  HGB 16.1  HCT 49.6  PLT 245   ------------------------------------------------------------------------------------------------------------------  Chemistries  Recent Labs  Lab 08/16/2017 1857  NA 142  K 4.2  CL 106  CO2 24  GLUCOSE 192*  BUN  25*  CREATININE 0.92  CALCIUM 9.1  AST 56*  ALT 29  ALKPHOS 147*  BILITOT 2.2*   ------------------------------------------------------------------------------------------------------------------  Cardiac Enzymes Recent Labs  Lab 08/18/2017 1857  TROPONINI 0.03*   ------------------------------------------------------------------------------------------------------------------  RADIOLOGY:  Dg Chest Portable 1 View  Result Date: 08/17/2017 CLINICAL DATA:  84 year old male with history of aspiration. EXAM: PORTABLE CHEST 1 VIEW COMPARISON:  Chest x-ray 09/15/2015. FINDINGS: Complete opacification throughout the mid to lower left hemithorax, compatible with large left pleural effusion with underlying atelectasis and/or airspace consolidation. Probable subsegmental atelectasis at the right lung base. Low lung volumes. No evidence of pulmonary edema. Cardiac silhouette is partially obscured but heart size appears normal. The patient is rotated to the left on today's exam, resulting in distortion of the mediastinal contours and reduced diagnostic sensitivity and specificity for mediastinal pathology. Atherosclerosis in the thoracic aorta. Status post median sternotomy for CABG. IMPRESSION: 1. Large left pleural effusion. 2. Atelectasis and/or airspace consolidation throughout the left mid to lower lung. 3. Right lower lobe subsegmental atelectasis. 4. Aortic atherosclerosis. Electronically Signed   By: Vinnie Langton M.D.   On: 07/25/2017 19:44    EKG:   Sinus tachycardia 112 bpm nonspecific ST-T wave changes.  IMPRESSION AND PLAN:   1.  Clinical sepsis  with pneumonia, tachycardia and leukocytosis.  Since the patient is at [facility] long-term care we did start aggressive antibiotics with vancomycin and Zosyn. 2.  Acute hypoxic and hypercarbic respiratory failure  And acidosis.  The patient was placed on BiPAP and repeat blood gas has improved.  Patient currently on 100% on the BiPAP.  Patient is a DO NOT RESUSCITATE. 3.  Abdominal distention, nausea and constipation.  A quick CT scan of the abdomen without contrast to rule out bowel obstruction.  My fear is that he does have a history of atrial fibrillation and if he threw a clot to the abdomen then the family would not want any surgical intervention and may move more towards comfort measures at that point. 4.  History of esophagitis and GERD will put on IV Protonix 5.  History of CAD.  Hold aspirin at this point 6.  History of colon cancer and prostate cancer  7.  Atrial fibrillation.  Continue to monitor closely 8.  Hypothyroidism unspecified we will give IV levothyroxine. 9.  Lactic acidosis likely from sepsis.  EKG reviewed by me.  Laboratory data reviewed by me.  Chest x-ray also reviewed by me  Management plans discussed with the patient, family and they are in agreement.  CODE STATUS: DNR  TOTAL TIME TAKING CARE OF THIS PATIENT: 50 minutes.  Patient will be admitted to the stepdown unit in the critical care unit.  Patient is critically ill and high risk of cardiopulmonary arrest.  Case discussed with E-link physician   Loletha Grayer M.D on 08/07/2017 at 9:04 PM  Between 7am to 6pm - Pager - [phone]  After 6pm call admission pager [phone]  [facility]  [phone]  CC: Primary care physician; Dr. Frazier Richards Case discussed with healing

## 2017-07-21 NOTE — Progress Notes (Signed)
CODE SEPSIS - PHARMACY COMMUNICATION  **Broad Spectrum Antibiotics should be administered within 1 hour of Sepsis diagnosis**  Time Code Sepsis Called/Page Received: 1907  Antibiotics Ordered: zosyn vanco  Time of 1st antibiotic administration: 1912  Additional action taken by pharmacy: none  If necessary, Name of Provider/Nurse Contacted: n/a    Thomasenia Sales ,PharmD Clinical Pharmacist  08/03/2017  9:26 PM

## 2017-07-21 NOTE — ED Notes (Signed)
Family at bedside. 

## 2017-07-21 NOTE — ED Notes (Signed)
ED Provider at bedside. 

## 2017-07-21 NOTE — ED Notes (Signed)
Family updated on admission bed status. Family verbalizes understanding.

## 2017-07-21 NOTE — ED Triage Notes (Signed)
Pt arrived via EMS from [facility] on CPAP.  Pt aspirated on soup. Upon EMS arrival pt was hypoxic at 70's % RA.  Per EMS, unsure what time aspiration happened.  H/o CHF, Afib.  Per EMS, family states pt is normally A&O x 4.

## 2017-07-21 NOTE — ED Notes (Addendum)
Report from angela, rn. Pt on bipap at 100%. md at bedside speaking with family. abd round, firm, active bowel sounds noted in all 4 quadrants. Pt with bilateral lower extremity contractures noted to toes and skin color purple to bilateral lower extremities, pt is alert, hard of hearing. Rhonchi auscultated in all lung fields, pt is tolerating bipap. Pt with history of a fib noted, but currently is alternating between short periods of regular rhythm and irregular rhythm, a fib.

## 2017-07-21 NOTE — Progress Notes (Signed)
Patient transferred to from ed to ct to ccu on bipap without incident. Able to titrate bipap pressures and o2 down as respiratory efforts have improved since the initiation of bipap. See flowsheet. Will continue to monitor

## 2017-07-21 NOTE — Progress Notes (Signed)
Pharmacy Antibiotic Note  Jacob Reyes is a 84 y.o. male admitted on 07/20/2017 with pneumonia.  Pharmacy has been consulted for zosyn and vancomycin dosing.  Plan: Vancomycin 1500mg  IV every 24 hours.  Goal trough 15-20 mcg/mL. Zosyn 3.375g IV q8h (4 hour infusion).  Weight: 176 lb (79.8 kg)  Temp (24hrs), Avg:98.6 F (37 C), Min:98.6 F (37 C), Max:98.6 F (37 C)  Recent Labs  Lab 08/05/2017 1857  WBC 20.4*  CREATININE 0.92  LATICACIDVEN 3.4*    Estimated Creatinine Clearance: 60.2 mL/min (by C-G formula based on SCr of 0.92 mg/dL).    Allergies  Allergen Reactions  . Carvedilol Other (See Comments)  . Duloxetine Other (See Comments)    Passing out  . Paroxetine Hcl Other (See Comments)    Hypotension  . Zaleplon Other (See Comments)    Antimicrobials this admission: Anti-infectives (From admission, onward)   Start     Dose/Rate Route Frequency Ordered Stop   07/22/17 0200  piperacillin-tazobactam (ZOSYN) IVPB 3.375 g     3.375 g 12.5 mL/hr over 240 Minutes Intravenous Every 8 hours 08/11/2017 2124     07/29/2017 2300  vancomycin (VANCOCIN) 1,500 mg in sodium chloride 0.9 % 500 mL IVPB     1,500 mg 250 mL/hr over 120 Minutes Intravenous Every 24 hours 08/15/2017 2124     07/30/2017 1915  piperacillin-tazobactam (ZOSYN) IVPB 3.375 g     3.375 g 100 mL/hr over 30 Minutes Intravenous  Once 08/11/2017 1904 08/17/2017 1954   08/18/2017 1915  vancomycin (VANCOCIN) IVPB 1000 mg/200 mL premix     1,000 mg 200 mL/hr over 60 Minutes Intravenous  Once 08/03/2017 1904 07/24/2017 2028       Microbiology results: No results found for this or any previous visit (from the past 240 hour(s)).]   Thank you for allowing pharmacy to be a part of this patient's care.  Donna Christen Reema Chick 07/30/2017 9:24 PM

## 2017-07-21 NOTE — ED Notes (Signed)
Pt's family updated on admission process. Pt appears to have improved respiratory distress, however lung sounds unchanged. Pt is tolerating bipap well.

## 2017-07-21 NOTE — ED Notes (Signed)
Critical lactic acid of 3.4 and troponin of 0.0o3 called from lab. Dr. Joni Fears notified, no new verbal orders received.

## 2017-07-22 DIAGNOSIS — J9601 Acute respiratory failure with hypoxia: Secondary | ICD-10-CM

## 2017-07-22 DIAGNOSIS — J9602 Acute respiratory failure with hypercapnia: Secondary | ICD-10-CM

## 2017-07-22 LAB — BASIC METABOLIC PANEL
Anion gap: 12 (ref 5–15)
BUN: 27 mg/dL — AB (ref 6–20)
CALCIUM: 8.4 mg/dL — AB (ref 8.9–10.3)
CO2: 18 mmol/L — ABNORMAL LOW (ref 22–32)
CREATININE: 0.78 mg/dL (ref 0.61–1.24)
Chloride: 112 mmol/L — ABNORMAL HIGH (ref 101–111)
Glucose, Bld: 94 mg/dL (ref 65–99)
Potassium: 3.8 mmol/L (ref 3.5–5.1)
SODIUM: 142 mmol/L (ref 135–145)

## 2017-07-22 LAB — CBC
HCT: 44.4 % (ref 40.0–52.0)
Hemoglobin: 14.9 g/dL (ref 13.0–18.0)
MCH: 31.2 pg (ref 26.0–34.0)
MCHC: 33.5 g/dL (ref 32.0–36.0)
MCV: 92.9 fL (ref 80.0–100.0)
PLATELETS: 170 10*3/uL (ref 150–440)
RBC: 4.77 MIL/uL (ref 4.40–5.90)
RDW: 16.5 % — AB (ref 11.5–14.5)
WBC: 9.9 10*3/uL (ref 3.8–10.6)

## 2017-07-22 LAB — TSH: TSH: 2.093 u[IU]/mL (ref 0.350–4.500)

## 2017-07-22 LAB — PROCALCITONIN: Procalcitonin: 1.83 ng/mL

## 2017-07-22 LAB — MRSA PCR SCREENING: MRSA BY PCR: NEGATIVE

## 2017-07-22 MED ORDER — ORAL CARE MOUTH RINSE
15.0000 mL | Freq: Two times a day (BID) | OROMUCOSAL | Status: DC
  Administered 2017-07-22 – 2017-07-26 (×8): 15 mL via OROMUCOSAL

## 2017-07-22 MED ORDER — SORBITOL 70 % SOLN
960.0000 mL | TOPICAL_OIL | Freq: Two times a day (BID) | ORAL | Status: DC
  Administered 2017-07-22 – 2017-07-25 (×3): 960 mL via RECTAL
  Filled 2017-07-22 (×11): qty 473

## 2017-07-22 MED ORDER — IPRATROPIUM-ALBUTEROL 0.5-2.5 (3) MG/3ML IN SOLN
3.0000 mL | RESPIRATORY_TRACT | Status: DC
  Administered 2017-07-22 – 2017-07-26 (×24): 3 mL via RESPIRATORY_TRACT
  Filled 2017-07-22 (×26): qty 3

## 2017-07-22 MED ORDER — CHLORHEXIDINE GLUCONATE 0.12 % MT SOLN
15.0000 mL | Freq: Two times a day (BID) | OROMUCOSAL | Status: DC
  Administered 2017-07-22 – 2017-07-26 (×8): 15 mL via OROMUCOSAL
  Filled 2017-07-22 (×6): qty 15

## 2017-07-22 MED ORDER — SORBITOL 70 % SOLN
960.0000 mL | TOPICAL_OIL | Freq: Two times a day (BID) | ORAL | Status: DC
  Filled 2017-07-22 (×2): qty 473

## 2017-07-22 MED ORDER — BUDESONIDE 0.5 MG/2ML IN SUSP
0.5000 mg | Freq: Two times a day (BID) | RESPIRATORY_TRACT | Status: DC
  Administered 2017-07-22 – 2017-07-26 (×8): 0.5 mg via RESPIRATORY_TRACT
  Filled 2017-07-22 (×8): qty 2

## 2017-07-22 MED ORDER — MORPHINE SULFATE (PF) 2 MG/ML IV SOLN
2.0000 mg | INTRAVENOUS | Status: DC | PRN
  Administered 2017-07-22 – 2017-07-23 (×6): 2 mg via INTRAVENOUS
  Filled 2017-07-22 (×6): qty 1

## 2017-07-22 NOTE — Progress Notes (Signed)
[facility] NAME: Jacob Reyes    MR#:  [id number]  DATE OF BIRTH:  Feb 02, 1926  SUBJECTIVE:  CHIEF COMPLAINT:   Chief Complaint  Patient presents with  . Aspiration  Case discussed with intensivist, still requiring BiPAP, for ultrasound-guided thoracentesis for left pleural effusion  REVIEW OF SYSTEMS:  CONSTITUTIONAL: No fever, fatigue or weakness.  EYES: No blurred or double vision.  EARS, NOSE, AND THROAT: No tinnitus or ear pain.  RESPIRATORY: No cough, shortness of breath, wheezing or hemoptysis.  CARDIOVASCULAR: No chest pain, orthopnea, edema.  GASTROINTESTINAL: No nausea, vomiting, diarrhea or abdominal pain.  GENITOURINARY: No dysuria, hematuria.  ENDOCRINE: No polyuria, nocturia,  HEMATOLOGY: No anemia, easy bruising or bleeding SKIN: No rash or lesion. MUSCULOSKELETAL: No joint pain or arthritis.   NEUROLOGIC: No tingling, numbness, weakness.  PSYCHIATRY: No anxiety or depression.   ROS  DRUG ALLERGIES:   Allergies  Allergen Reactions  . Carvedilol Other (See Comments)  . Duloxetine Other (See Comments)    Passing out  . Paroxetine Hcl Other (See Comments)    Hypotension  . Zaleplon Other (See Comments)    VITALS:  Blood pressure 92/66, pulse 94, temperature 98.3 F (36.8 C), temperature source Oral, resp. rate (!) 32, weight 79.8 kg (176 lb), SpO2 91 %.  PHYSICAL EXAMINATION:  GENERAL:  84 y.o.-year-old patient lying in the bed with no acute distress.  EYES: Pupils equal, round, reactive to light and accommodation. No scleral icterus. Extraocular muscles intact.  HEENT: Head atraumatic, normocephalic. Oropharynx and nasopharynx clear.  NECK:  Supple, no jugular venous distention. No thyroid enlargement, no tenderness.  LUNGS: Normal breath sounds bilaterally, no wheezing, rales,rhonchi or crepitation. No use of accessory muscles of respiration.  CARDIOVASCULAR: S1, S2 normal. No murmurs, rubs, or  gallops.  ABDOMEN: Soft, nontender, nondistended. Bowel sounds present. No organomegaly or mass.  EXTREMITIES: No pedal edema, cyanosis, or clubbing.  NEUROLOGIC: Cranial nerves II through XII are intact. Muscle strength 5/5 in all extremities. Sensation intact. Gait not checked.  PSYCHIATRIC: The patient is alert and oriented x 3.  SKIN: No obvious rash, lesion, or ulcer.   Physical Exam LABORATORY PANEL:   CBC Recent Labs  Lab 07/22/17 0259  WBC 9.9  HGB 14.9  HCT 44.4  PLT 170   ------------------------------------------------------------------------------------------------------------------  Chemistries  Recent Labs  Lab 08/14/2017 1857 07/22/17 0259  NA 142 142  K 4.2 3.8  CL 106 112*  CO2 24 18*  GLUCOSE 192* 94  BUN 25* 27*  CREATININE 0.92 0.78  CALCIUM 9.1 8.4*  AST 56*  --   ALT 29  --   ALKPHOS 147*  --   BILITOT 2.2*  --    ------------------------------------------------------------------------------------------------------------------  Cardiac Enzymes Recent Labs  Lab 08/02/2017 1857  TROPONINI 0.03*   ------------------------------------------------------------------------------------------------------------------  RADIOLOGY:  Ct Abdomen Pelvis Wo Contrast  Result Date: 07/28/2017 CLINICAL DATA:  Abdominal distention. The patient aspirated soup today. EXAM: CT ABDOMEN AND PELVIS WITHOUT CONTRAST TECHNIQUE: Multidetector CT imaging of the abdomen and pelvis was performed following the standard protocol without IV contrast. COMPARISON:  CT abdomen and pelvis 10/02/2005. Single-view of the chest today. FINDINGS: Lower chest: Moderate left and small right pleural effusions are identified. There is some high-density material within the dependent lung parenchyma bilaterally which may represent aspirated substances. There is airspace disease in the lower lobes bilaterally, worse on the left. Patchy airspace disease is also seen in the imaged right middle lobe.  Visualized  esophagus is fluid-filled. Heart size is upper normal. No pericardial effusion. Calcific aortic and coronary atherosclerosis noted. Hepatobiliary: No focal liver abnormality is seen. No gallstones, gallbladder wall thickening, or biliary dilatation. Pancreas: Unremarkable. No pancreatic ductal dilatation or surrounding inflammatory changes. Marked fatty replacement of the pancreas noted. Spleen: Normal in size without focal abnormality. Adrenals/Urinary Tract: Urinary bladder is decompressed with a catheter in place. A large cyst off the upper pole of the left kidney was scattered calcifications within it is unchanged in appearance. The kidneys are otherwise unremarkable. Adrenal glands appear normal. Stomach/Bowel: The colon is markedly distended throughout. A very large volume of stool is present in the rectosigmoid colon. Prominent stool is also identified in the ascending and proximal transverse colon. A fluid level is seen in the mid to distal transverse colon. Nondependent gas is seen in a short segment of colon in the left abdomen as on image 50. Small hiatal hernia is noted. No evidence of small bowel obstruction. Bilateral inguinal hernias contain loops of bowel and a small volume of fluid. No pneumatosis or portal venous gas is identified. No free intraperitoneal air. Vascular/Lymphatic: Atherosclerosis without aneurysm. No lymphadenopathy. Reproductive: Prostate is unremarkable. Other: No ascites. Musculoskeletal: No lytic or sclerotic lesion. S shaped scoliosis and multilevel degenerative change noted. No fracture. IMPRESSION: Marked gaseous distension of the colon throughout with a massive volume of stool in the rectosigmoid consistent with fecal impaction. A short segment of colon in the left abdomen demonstrates nondependent gas which could be within stool or could be due to pneumatosis. There is no portal venous gas or other sign of bowel ischemia and this finding may be incidental.  Bilateral lower lobe airspace disease and effusions consistent with pneumonia are worse on the left. There also appears to be some aspirated material within the lungs. Visualized esophagus is fluid-filled throughout consistent with reflux and/or poor motility. Bilateral inguinal hernias contain loops of bowel without obstruction. Electronically Signed   By: Inge Rise M.D.   On: 08/05/2017 22:10   Dg Chest Portable 1 View  Result Date: 08/15/2017 CLINICAL DATA:  84 year old male with history of aspiration. EXAM: PORTABLE CHEST 1 VIEW COMPARISON:  Chest x-ray 09/15/2015. FINDINGS: Complete opacification throughout the mid to lower left hemithorax, compatible with large left pleural effusion with underlying atelectasis and/or airspace consolidation. Probable subsegmental atelectasis at the right lung base. Low lung volumes. No evidence of pulmonary edema. Cardiac silhouette is partially obscured but heart size appears normal. The patient is rotated to the left on today's exam, resulting in distortion of the mediastinal contours and reduced diagnostic sensitivity and specificity for mediastinal pathology. Atherosclerosis in the thoracic aorta. Status post median sternotomy for CABG. IMPRESSION: 1. Large left pleural effusion. 2. Atelectasis and/or airspace consolidation throughout the left mid to lower lung. 3. Right lower lobe subsegmental atelectasis. 4. Aortic atherosclerosis. Electronically Signed   By: Vinnie Langton M.D.   On: 08/16/2017 19:44    ASSESSMENT AND PLAN:  1.    Acute sepsis  Due to HCAP Continue sepsis protocol, empiric IV Vanco/Zosyn, f/u on culutures  2.  Acute hypoxic and hypercarbic respiratory failure and acidosis Stable Continue BiPAP w/ weaning as tolerated DNR noted  3.  Acute fecal impaction CT noted for large stool burden Bowel regiment   4.  Acute left pleural effusion For U/S guided thoracentesis  5.  Chronic GERD w/ esophagitis  Stable  PPI daily   6.   Hx CAD Stable on current regiment  7. Hx Afib  Stable  8.  Hx of colon cancer  Stable  9. Hx of prostate cancer Stable  10.  Hypothyroidism, chronic Stable Continue levothyroxine  All the records are reviewed and case discussed with Care Management/Social Workerr. Management plans discussed with the patient, family and they are in agreement.  CODE STATUS: DNR  TOTAL TIME TAKING CARE OF THIS PATIENT: 35 minutes.     POSSIBLE D/C IN 2-4 DAYS, DEPENDING ON CLINICAL CONDITION.   Avel Peace Salary M.D on 07/22/2017   Between 7am to 6pm - Pager - [phone]  After 6pm go to [email or web address] - password [email or web address]  Office  [phone]  CC: Primary care physician; Rusty Aus, MD  Note: This dictation was prepared with Dragon dictation along with smaller phrase technology. Any transcriptional errors that result from this process are unintentional.

## 2017-07-22 NOTE — Consult Note (Signed)
[facility]  PATIENT NAME: Jacob Reyes    MR#:  [id number]  DATE OF BIRTH:  Aug 12, 1925  DATE OF ADMISSION:  08/13/2017  PRIMARY CARE PHYSICIAN: Dr Frazier Richards   REQUESTING/REFERRING PHYSICIAN: Dr Eliott Nine   CHIEF COMPLAINT:   Chief Complaint  Patient presents with  . Aspiration    HISTORY OF PRESENT ILLNESS:  Jacob Reyes  is a 84 y.o. male with a known history of atrial fibrillation, coronary artery disease, gastroesophageal reflux disease, Parkinson-like syndrome. - He has been having a distended abdomen last few days -cannot recall when his last bowel movement was -He has had nausea but no vomiting  The patient was brought in and placed on BiPAP for acute respiratory failure The patient is on 100% on the BiPAP.  The patient is a DNR/DNI  Patient remains on BiPAP CT abd reveiwed +fecal impaction +left sided opacity with left lung  effusion    Review of Systems: UNABLE TO PROVIDE DUE TO RESP DISTRESS Other:  All other systems negative   PAST MEDICAL HISTORY:   Past Medical History:  Diagnosis Date  . Abnormal stress test   . Acquired hypothyroidism    unspecified  . AF (paroxysmal atrial fibrillation) ([place])   . Anxiety    just started med. for anxiety   . Cancer [facility])    prostate  . Colon cancer ([place]) 11/16/2013   partial colectomy  . Coronary artery disease   . Depression    unspecified  . GERD (gastroesophageal reflux disease)   . Glaucoma   . Hearing loss   . History of hiatal hernia   . Hyperlipidemia 11/16/2013  . Hypertension   . Pernicious anemia   . Prostate cancer ([place]) 11/16/2013   1/8 bx pos, waiting  . Reflux   . Reflux esophagitis 11/16/2013    PAST SURGICAL HISTORY:   Past Surgical History:  Procedure Laterality Date  . CARDIAC CATHETERIZATION N/A 12/10/2014   Procedure: Left Heart Cath and Coronary Angiography;  Surgeon: Teodoro Spray, MD;  Location: [facility];  Service:  Cardiovascular;  Laterality: N/A;  . CATARACT EXTRACTION    . COLON SURGERY    . COLONOSCOPY    . CORONARY ARTERY BYPASS GRAFT N/A 12/28/2014   Procedure: CORONARY ARTERY BYPASS GRAFTING (CABG), ON PUMP, TIMES SIX, USING LEFT INTERNAL MAMMARY ARTERY, RIGHT GREATER SAPHENOUS VEIN HARVESTED ENDOSCOPICALLY;  Surgeon: [name], MD;  Location: [facility];  Service: Open Heart Surgery;  Laterality: N/A;  -SEQ LIMA to MID LAD and DISTAL LAD -SVG to DIAGONAL -SEQ SVG to OM1 and OM2 -SVG to PDA  . EYE SURGERY     catarcats removed bilataeral /w IOL  . FOOT SURGERY Right 2009   Shorten metatarsal  . HERNIA REPAIR Bilateral 1991   inguinal   . PARTIAL COLECTOMY     WITH ANANSTOMSIS  . TEE WITHOUT CARDIOVERSION N/A 12/28/2014   Procedure: TRANSESOPHAGEAL ECHOCARDIOGRAM (TEE);  Surgeon: [name], MD;  Location: [facility];  Service: Open Heart Surgery;  Laterality: N/A;  . TONSILLECTOMY      SOCIAL HISTORY:   Social History   Tobacco Use  . Smoking status: Never Smoker  . Smokeless tobacco: Never Used  Substance Use Topics  . Alcohol use: No    FAMILY HISTORY:   Family History  Problem Relation Age of Onset  . Coronary artery disease Mother   . Heart attack Mother   . Coronary artery disease Father  coronary thrombosis  . Heart attack Father   . Alzheimer's disease Sister   . Glaucoma Sister   . Coronary artery disease Brother   . Heart attack Brother   . Sleep disorder Brother   . Cancer Maternal Grandfather   . Pancreatic cancer Sister     DRUG ALLERGIES:   Allergies  Allergen Reactions  . Carvedilol Other (See Comments)  . Duloxetine Other (See Comments)    Passing out  . Paroxetine Hcl Other (See Comments)    Hypotension  . Zaleplon Other (See Comments)    VITAL SIGNS:  Blood pressure (!) 89/67, pulse 93, temperature 98.6 F (37 C), temperature source Axillary, resp. rate (!) 25, weight 176 lb (79.8 kg), SpO2 91 %.  PHYSICAL EXAMINATION:  GENERAL:   84 y.o.-year-old patient lying in the bed with acute respiratory distress. On BIPAP EYES: Pupils equal, round, reactive to light and accommodation. No scleral icterus. HEENT: Head atraumatic, normocephalic. Oropharynx and nasopharynx clear.  NECK:  Supple, no jugular venous distention. No thyroid enlargement, no tenderness.  LUNGS: Decreased breath sounds bilaterally, upper airway wheezing, rhonchi bilateral base. No use of accessory muscles of respiration.  CARDIOVASCULAR: S1, S2 irregularly irregular tachycardic. No murmurs, rubs, or gallops.  ABDOMEN: Soft, tender, distended. Bowel sounds absent. No organomegaly or mass.  EXTREMITIES: 2+ edema, no cyanosis, or clubbing.  NEUROLOGIC: Patient able to move his arms on his own.  Lethargic PSYCHIATRIC: The patient is lethargic.  SKIN:  Chronic lower extremity discoloration.  Bruising lower extremities and upper extremities  LABORATORY PANEL:   CBC Recent Labs  Lab 07/22/17 0259  WBC 9.9  HGB 14.9  HCT 44.4  PLT 170   ------------------------------------------------------------------------------------------------------------------  Chemistries  Recent Labs  Lab 07/31/2017 1857 07/22/17 0259  NA 142 142  K 4.2 3.8  CL 106 112*  CO2 24 18*  GLUCOSE 192* 94  BUN 25* 27*  CREATININE 0.92 0.78  CALCIUM 9.1 8.4*  AST 56*  --   ALT 29  --   ALKPHOS 147*  --   BILITOT 2.2*  --    ------------------------------------------------------------------------------------------------------------------  Cardiac Enzymes Recent Labs  Lab 08/05/2017 1857  TROPONINI 0.03*     EKG:   Sinus tachycardia 112 bpm nonspecific ST-T wave changes.  IMPRESSION AND PLAN:  84 yo white male with multiple medical issues with acute resp failure with acute fecal impaction and abd distention with acute left sided opacity and pneumonia with effusion   1.Respiratory Failure -patient is DNR/DNI Oxygen as needed BiPAP as  needed  2.Pneumonia-aspiration/HCAP Continue IV abx NEB therapy BiPAP and oxygen  3. Acute hypoxic and hypercarbic respiratory failure  And acidosis.  The patient was placed on BiPAP and repeat blood gas has improved.  Patient currently on 100% on the BiPAP.  Patient is a DO NOT RESUSCITATE/DNI  4.Abdominal distention, nausea and constipation-+fecal impaction S/p enema  5.left sided pleural effusion Plan for US guided thoracentesis  6.Lactic acidosis likely from sepsis -IVF's follow up [place]   CODE STATUS: DNR/DNI   Critical Care Time devoted to patient care services described in this note is 45 minutes.   Overall, patient is critically ill, prognosis is guarded.  Patient with Multiorgan failure and at high risk for cardiac arrest and death.    Corrin Parker, M.D.  [facility]  Medical Director [place] Director [facility] Cardio-Pulmonary Department

## 2017-07-22 NOTE — Progress Notes (Signed)
eLink Physician-Brief Progress Note Patient Name: Jacob Reyes DOB: 1926-04-09 MRN: [id number]   Date of Service  07/22/2017  HPI/Events of Note  Admitted for respiratory distress with abdominal distention. Patient is DNR. CT shows stool impaction  eICU Interventions  Enema ordered.        Cecilia Nishikawa 07/22/2017, 1:37 AM

## 2017-07-22 NOTE — Progress Notes (Signed)
Alert and oriented. Pain free. Lungs diminished with few rhonchi on left. Periods of tachypnea and air hunger improve with morphine 2mg  IV. Has worn bipap most of day.  Short intervals on 6L [place] tried  but sats not maintained. HFNC recently placed due to pt bridge of nose being painful even with aqua guard. Abdominal distention helped by SMOG enema. He has had 2 very large brown mushy stool results. He will get another SMOG enema this eve. Remains NPO with few chips and sips. No N&V.  Monitors AFib with controlled rate. Wife and sons in and out to stay with pt.

## 2017-07-23 ENCOUNTER — Inpatient Hospital Stay: Payer: PPO

## 2017-07-23 LAB — BODY FLUID CELL COUNT WITH DIFFERENTIAL
Eos, Fluid: 0 %
LYMPHS FL: 64 %
MONOCYTE-MACROPHAGE-SEROUS FLUID: 6 %
Neutrophil Count, Fluid: 30 %
Other Cells, Fluid: 0 %
WBC FLUID: 1133 uL

## 2017-07-23 LAB — GLUCOSE, PLEURAL OR PERITONEAL FLUID: Glucose, Fluid: 96 mg/dL

## 2017-07-23 LAB — LACTATE DEHYDROGENASE, PLEURAL OR PERITONEAL FLUID: LD, Fluid: 179 U/L — ABNORMAL HIGH (ref 3–23)

## 2017-07-23 LAB — PROCALCITONIN: Procalcitonin: 3.97 ng/mL

## 2017-07-23 LAB — PROTEIN, PLEURAL OR PERITONEAL FLUID: TOTAL PROTEIN, FLUID: 3.5 g/dL

## 2017-07-23 MED ORDER — FUROSEMIDE 10 MG/ML IJ SOLN
40.0000 mg | Freq: Once | INTRAMUSCULAR | Status: AC
  Administered 2017-07-23: 40 mg via INTRAVENOUS
  Filled 2017-07-23: qty 4

## 2017-07-23 MED ORDER — SODIUM CHLORIDE 0.9 % IV SOLN
0.0000 ug/min | INTRAVENOUS | Status: DC
  Filled 2017-07-23: qty 1

## 2017-07-23 NOTE — Progress Notes (Signed)
Flexi seal inserted per Dr Mortimer Fries; thick liquid stool flowing well, placed call to [facility] re new pneumothorax post thoracentesis

## 2017-07-23 NOTE — Progress Notes (Signed)
[facility] NAME: Jacob Reyes    MR#:  [id number]  DATE OF BIRTH:  [birth year]  SUBJECTIVE:  CHIEF COMPLAINT:   Chief Complaint  Patient presents with  . Aspiration  Case discussed with intensivist, for thoracentesis, continue BiPAP with weaning as tolerated  REVIEW OF SYSTEMS:  CONSTITUTIONAL: No fever, fatigue or weakness.  EYES: No blurred or double vision.  EARS, NOSE, AND THROAT: No tinnitus or ear pain.  RESPIRATORY: No cough, shortness of breath, wheezing or hemoptysis.  CARDIOVASCULAR: No chest pain, orthopnea, edema.  GASTROINTESTINAL: No nausea, vomiting, diarrhea or abdominal pain.  GENITOURINARY: No dysuria, hematuria.  ENDOCRINE: No polyuria, nocturia,  HEMATOLOGY: No anemia, easy bruising or bleeding SKIN: No rash or lesion. MUSCULOSKELETAL: No joint pain or arthritis.   NEUROLOGIC: No tingling, numbness, weakness.  PSYCHIATRY: No anxiety or depression.   ROS  DRUG ALLERGIES:   Allergies  Allergen Reactions  . Carvedilol Other (See Comments)  . Duloxetine Other (See Comments)    Passing out  . Paroxetine Hcl Other (See Comments)    Hypotension  . Zaleplon Other (See Comments)    VITALS:  Blood pressure 109/82, pulse (!) 105, temperature 97.7 F (36.5 C), temperature source Axillary, resp. rate (!) 27, height 5\' 10"  (1.778 m), weight 79.8 kg (175 lb 14.8 oz), SpO2 97 %.  PHYSICAL EXAMINATION:  GENERAL:  84 y.o.-year-old patient lying in the bed with no acute distress.  EYES: Pupils equal, round, reactive to light and accommodation. No scleral icterus. Extraocular muscles intact.  HEENT: Head atraumatic, normocephalic. Oropharynx and nasopharynx clear.  NECK:  Supple, no jugular venous distention. No thyroid enlargement, no tenderness.  LUNGS: Normal breath sounds bilaterally, no wheezing, rales,rhonchi or crepitation. No use of accessory muscles of respiration.  CARDIOVASCULAR: S1, S2 normal. No  murmurs, rubs, or gallops.  ABDOMEN: Soft, nontender, nondistended. Bowel sounds present. No organomegaly or mass.  EXTREMITIES: No pedal edema, cyanosis, or clubbing.  NEUROLOGIC: Cranial nerves II through XII are intact. Muscle strength 5/5 in all extremities. Sensation intact. Gait not checked.  PSYCHIATRIC: The patient is alert and oriented x 3.  SKIN: No obvious rash, lesion, or ulcer.   Physical Exam LABORATORY PANEL:   CBC Recent Labs  Lab 07/22/17 0259  WBC 9.9  HGB 14.9  HCT 44.4  PLT 170   ------------------------------------------------------------------------------------------------------------------  Chemistries  Recent Labs  Lab 08/06/2017 1857 07/22/17 0259  NA 142 142  K 4.2 3.8  CL 106 112*  CO2 24 18*  GLUCOSE 192* 94  BUN 25* 27*  CREATININE 0.92 0.78  CALCIUM 9.1 8.4*  AST 56*  --   ALT 29  --   ALKPHOS 147*  --   BILITOT 2.2*  --    ------------------------------------------------------------------------------------------------------------------  Cardiac Enzymes Recent Labs  Lab 08/12/2017 1857  TROPONINI 0.03*   ------------------------------------------------------------------------------------------------------------------  RADIOLOGY:  Ct Abdomen Pelvis Wo Contrast  Result Date: 08/12/2017 CLINICAL DATA:  Abdominal distention. The patient aspirated soup today. EXAM: CT ABDOMEN AND PELVIS WITHOUT CONTRAST TECHNIQUE: Multidetector CT imaging of the abdomen and pelvis was performed following the standard protocol without IV contrast. COMPARISON:  CT abdomen and pelvis 10/02/2005. Single-view of the chest today. FINDINGS: Lower chest: Moderate left and small right pleural effusions are identified. There is some high-density material within the dependent lung parenchyma bilaterally which may represent aspirated substances. There is airspace disease in the lower lobes bilaterally, worse on the left. Patchy airspace disease is also seen in the  imaged  right middle lobe. Visualized esophagus is fluid-filled. Heart size is upper normal. No pericardial effusion. Calcific aortic and coronary atherosclerosis noted. Hepatobiliary: No focal liver abnormality is seen. No gallstones, gallbladder wall thickening, or biliary dilatation. Pancreas: Unremarkable. No pancreatic ductal dilatation or surrounding inflammatory changes. Marked fatty replacement of the pancreas noted. Spleen: Normal in size without focal abnormality. Adrenals/Urinary Tract: Urinary bladder is decompressed with a catheter in place. A large cyst off the upper pole of the left kidney was scattered calcifications within it is unchanged in appearance. The kidneys are otherwise unremarkable. Adrenal glands appear normal. Stomach/Bowel: The colon is markedly distended throughout. A very large volume of stool is present in the rectosigmoid colon. Prominent stool is also identified in the ascending and proximal transverse colon. A fluid level is seen in the mid to distal transverse colon. Nondependent gas is seen in a short segment of colon in the left abdomen as on image 50. Small hiatal hernia is noted. No evidence of small bowel obstruction. Bilateral inguinal hernias contain loops of bowel and a small volume of fluid. No pneumatosis or portal venous gas is identified. No free intraperitoneal air. Vascular/Lymphatic: Atherosclerosis without aneurysm. No lymphadenopathy. Reproductive: Prostate is unremarkable. Other: No ascites. Musculoskeletal: No lytic or sclerotic lesion. S shaped scoliosis and multilevel degenerative change noted. No fracture. IMPRESSION: Marked gaseous distension of the colon throughout with a massive volume of stool in the rectosigmoid consistent with fecal impaction. A short segment of colon in the left abdomen demonstrates nondependent gas which could be within stool or could be due to pneumatosis. There is no portal venous gas or other sign of bowel ischemia and this finding may be  incidental. Bilateral lower lobe airspace disease and effusions consistent with pneumonia are worse on the left. There also appears to be some aspirated material within the lungs. Visualized esophagus is fluid-filled throughout consistent with reflux and/or poor motility. Bilateral inguinal hernias contain loops of bowel without obstruction. Electronically Signed   By: Inge Rise M.D.   On: 07/25/2017 22:10   Dg Abd 1 View  Result Date: 07/23/2017 CLINICAL DATA:  Abdominal distension EXAM: ABDOMEN - 1 VIEW COMPARISON:  CT abdomen dated 08/18/2017 FINDINGS: Persistently dilated gas-filled bowel throughout the abdomen and pelvis, corresponding to the distension of the entire colon demonstrated on recent CT, without significant interval change. No evidence of soft tissue mass or abnormal fluid collection. No evidence of free intraperitoneal air seen. Degenerative changes are seen throughout the scoliotic thoracolumbar spine. No acute or suspicious osseous finding. IMPRESSION: Distended gas-filled loops of bowel throughout the abdomen and pelvis, compatible with the distention of the entire colon demonstrated on recent CT, without significant interval change. Electronically Signed   By: Franki Cabot M.D.   On: 07/23/2017 07:24   Dg Chest Port 1 View  Result Date: 07/23/2017 CLINICAL DATA:  Pleural effusion. EXAM: PORTABLE CHEST 1 VIEW COMPARISON:  Chest x-rays dated 08/03/2017 in 09/15/2015. FINDINGS: Persistent left-sided pleural effusion, moderate to large in size. Again noted is suspected underlying atelectasis or other airspace consolidation. There are new bilateral interstitial and alveolar airspace opacities, perihilar and upper lobe predominant, suggesting pulmonary edema. Probable stable atelectasis at the right lung base. Heart size and mediastinal contours are stable. Atherosclerotic changes again noted at the aortic arch. Median sternotomy wires appear intact and stable in alignment. No acute or  suspicious osseous finding. IMPRESSION: 1. New bilateral interstitial and alveolar opacities, perihilar and upper lobe predominant, most suggestive of pulmonary edema pattern,  compatible with CHF/volume overload. If febrile, differential would also include atypical pneumonia such as fungal and viral. 2. Persistent left-sided pleural effusion, moderate to large in size, not significantly changed compared to the recent exam of 08/19/2017. 3. Suspected underlying atelectasis at the left lung base. Again, if febrile, underlying pneumonia could not be excluded. 4. Stable cardiomegaly. 5. Aortic atherosclerosis. Electronically Signed   By: Franki Cabot M.D.   On: 07/23/2017 07:18   Dg Chest Portable 1 View  Result Date: 08/06/2017 CLINICAL DATA:  84 year old male with history of aspiration. EXAM: PORTABLE CHEST 1 VIEW COMPARISON:  Chest x-ray 09/15/2015. FINDINGS: Complete opacification throughout the mid to lower left hemithorax, compatible with large left pleural effusion with underlying atelectasis and/or airspace consolidation. Probable subsegmental atelectasis at the right lung base. Low lung volumes. No evidence of pulmonary edema. Cardiac silhouette is partially obscured but heart size appears normal. The patient is rotated to the left on today's exam, resulting in distortion of the mediastinal contours and reduced diagnostic sensitivity and specificity for mediastinal pathology. Atherosclerosis in the thoracic aorta. Status post median sternotomy for CABG. IMPRESSION: 1. Large left pleural effusion. 2. Atelectasis and/or airspace consolidation throughout the left mid to lower lung. 3. Right lower lobe subsegmental atelectasis. 4. Aortic atherosclerosis. Electronically Signed   By: Vinnie Langton M.D.   On: 07/24/2017 19:44    ASSESSMENT AND PLAN:   1.   Acute sepsis  Due to HCAP Continue sepsis protocol, IV Zosyn   2. Acute hypoxic and hypercarbic respiratory failure and  acidosis Stable Continue BiPAP w/ weaning as tolerated, s/p thoracentesis 3/4 w/ 1.2 L taken off  DNR noted  3. Acute fecal impaction Resolved CT noted for large stool burden Continue bowel regiment  4. Acute left pleural effusion Status post U/S guided thoracentesis with 1.2 L taken off  5.  Chronic GERD w/ esophagitis  Stable  PPI daily   6. Hx CAD Stable on current regiment  7. Hx Afib Stable  8. Hx of colon cancer  Stable  9. Hx of prostate cancer Stable  10. Hypothyroidism, chronic Stable Continue levothyroxine  All the records are reviewed and case discussed with Care Management/Social Workerr. Management plans discussed with the patient, family and they are in agreement.  CODE STATUS: dnr  TOTAL TIME TAKING CARE OF THIS PATIENT: 35 minutes.     POSSIBLE D/C IN 2-5 DAYS, DEPENDING ON CLINICAL CONDITION.   Avel Peace Elicia Lui M.D on 07/23/2017   Between 7am to 6pm - Pager - [phone]  After 6pm go to [email or web address] - password [email or web address] [facility]  Office  [phone]  CC: Primary care physician; Rusty Aus, MD  Note: This dictation was prepared with Dragon dictation along with smaller phrase technology. Any transcriptional errors that result from this process are unintentional.

## 2017-07-23 NOTE — Consult Note (Signed)
[facility]  PATIENT NAME: Jacob Reyes    MR#:  [id number]  DATE OF BIRTH:  [birth year]  DATE OF ADMISSION:  08/15/2017  PRIMARY CARE PHYSICIAN: Dr Frazier Richards   REQUESTING/REFERRING PHYSICIAN: Dr Eliott Nine   CHIEF COMPLAINT:   Chief Complaint  Patient presents with  . Aspiration    HISTORY OF PRESENT ILLNESS:   The patient was brought in and placed on BiPAP for acute respiratory failure The patient is on 100% on the BiPAP.  The patient is a DNR/DNI  Taken off biPAP remains on 100% high flow [place] CT abd reveiwed +fecal impaction +left sided opacity with left lung  Effusion +BM's Less distended today Still with resp failure    Review of Systems: UNABLE TO PROVIDE DUE TO RESP DISTRESS Other:  All other systems negative   VITAL SIGNS:  Blood pressure 107/89, pulse (!) 105, temperature 97.9 F (36.6 C), temperature source Oral, resp. rate (!) 34, weight 176 lb (79.8 kg), SpO2 91 %.  PHYSICAL EXAMINATION:  GENERAL:  84 y.o.-year-old patient lying in the bed with acute respiratory distress. On BIPAP EYES: Pupils equal, round, reactive to light and accommodation. No scleral icterus. HEENT: Head atraumatic, normocephalic. Oropharynx and nasopharynx clear.  NECK:  Supple, no jugular venous distention. No thyroid enlargement, no tenderness.  LUNGS: Decreased breath sounds bilaterally, upper airway wheezing, rhonchi bilateral base. No use of accessory muscles of respiration.  CARDIOVASCULAR: S1, S2 irregularly irregular tachycardic. No murmurs, rubs, or gallops.  ABDOMEN:+ distended. Decreased Bowel sounds absent. No organomegaly or mass.  EXTREMITIES: 2+ edema, no cyanosis, or clubbing.  NEUROLOGIC: Patient able to move his arms on his own.  Lethargic PSYCHIATRIC: The patient is lethargic.  SKIN:  Chronic lower extremity discoloration.  Bruising lower extremities and upper extremities  LABORATORY PANEL:   CBC Recent Labs  Lab  07/22/17 0259  WBC 9.9  HGB 14.9  HCT 44.4  PLT 170   ------------------------------------------------------------------------------------------------------------------  Chemistries  Recent Labs  Lab 07/25/2017 1857 07/22/17 0259  NA 142 142  K 4.2 3.8  CL 106 112*  CO2 24 18*  GLUCOSE 192* 94  BUN 25* 27*  CREATININE 0.92 0.78  CALCIUM 9.1 8.4*  AST 56*  --   ALT 29  --   ALKPHOS 147*  --   BILITOT 2.2*  --    ------------------------------------------------------------------------------------------------------------------  Cardiac Enzymes Recent Labs  Lab 08/06/2017 1857  TROPONINI 0.03*     EKG:   Sinus tachycardia 112 bpm nonspecific ST-T wave changes.  IMPRESSION AND PLAN:  84 yo white male with multiple medical issues with acute resp failure with acute fecal impaction and abd distention with acute left sided opacity and pneumonia with effusion   1.Respiratory Failure -patient is DNR/DNI Oxygen as needed BiPAP as needed High flow [place] as tolerated  2.Pneumonia-aspiration/HCAP Continue IV abx NEB therapy BiPAP and oxygen  3. Acute hypoxic and hypercarbic respiratory failure  And acidosis.  The patient was placed on BiPAP and repeat blood gas has improved.  Patient currently on 100% on the BiPAP.  Patient is a DO NOT RESUSCITATE/DNI  4.Abdominal distention, nausea and constipation-+fecal impaction S/p enema Place rectal tube for decompression  5.left sided pleural effusion Plan for US guided thoracentesis today  6.Lactic acidosis likely from sepsis -IVF's follow up [place]   CODE STATUS: DNR/DNI   Critical Care Time devoted to patient care services described in this note is 37 minutes.   Overall, patient is critically ill, prognosis is guarded.  Patient with Multiorgan failure and at high risk for cardiac arrest and death.    Corrin Parker, M.D.  [facility]  Medical Director [place]  Director [facility] Cardio-Pulmonary Department

## 2017-07-23 NOTE — Progress Notes (Signed)
Please note patient is currently followed by out patient PALLIATIVE at the [facility]. CSW Shela Leff made aware. Flo Shanks RN, BSN, [place] and Palliative care of [place], [facility] [phone]

## 2017-07-23 NOTE — Procedures (Signed)
Interventional Radiology Procedure Note  Procedure: US guided left thoracentesis  Complications: None  Estimated Blood Loss: < 10 mL  Findings: Left thoracentesis at bedside yielded 1.2 L of bloody pleural fluid.  Towards end of procedure, there was return of air, likely consistent with lack of complete re-expansion of lung and ex vacuo PTX.  Will check post CXR.  Venetia Night. Kathlene Cote, M.D Pager:  [phone]

## 2017-07-23 NOTE — Progress Notes (Signed)
Patient ID: Jacob Reyes, male   DOB: [birth year], 84 y.o.   MRN: [id number]  Interventional Radiology:  CXR after left thoracentesis shows moderate/large left PTX due to lack of re-expansion of the left lung after thoracentesis.  Procedure was uncomplicated and no chance of lung injury during procedure. Dead space will likely refill with fluid.  Venetia Night. Kathlene Cote, M.D Pager:  [phone]

## 2017-07-23 NOTE — Progress Notes (Signed)
Pt unable to recover adequate O2 sats with BiPap at 100%, was briefly off to [place] for a few ice chips.  Dr Mortimer Fries aware, order lasix, writer requested [employer] consult, ok to order

## 2017-07-24 ENCOUNTER — Inpatient Hospital Stay (HOSPITAL_COMMUNITY)
Admit: 2017-07-24 | Discharge: 2017-07-24 | Disposition: A | Discharge status: Home / Self Care | Payer: PPO | Attending: Internal Medicine | Admitting: Internal Medicine

## 2017-07-24 DIAGNOSIS — I351 Nonrheumatic aortic (valve) insufficiency: Secondary | ICD-10-CM

## 2017-07-24 LAB — CBC WITH DIFFERENTIAL/PLATELET
BASOS ABS: 0 10*3/uL (ref 0–0.1)
BASOS PCT: 0 %
EOS ABS: 0 10*3/uL (ref 0–0.7)
Eosinophils Relative: 0 %
HEMATOCRIT: 41.7 % (ref 40.0–52.0)
Hemoglobin: 14 g/dL (ref 13.0–18.0)
Lymphocytes Relative: 6 %
Lymphs Abs: 0.6 10*3/uL — ABNORMAL LOW (ref 1.0–3.6)
MCH: 31.1 pg (ref 26.0–34.0)
MCHC: 33.5 g/dL (ref 32.0–36.0)
MCV: 93.1 fL (ref 80.0–100.0)
MONO ABS: 0.5 10*3/uL (ref 0.2–1.0)
MONOS PCT: 5 %
NEUTROS PCT: 89 %
Neutro Abs: 7.9 10*3/uL — ABNORMAL HIGH (ref 1.4–6.5)
Platelets: 160 10*3/uL (ref 150–440)
RBC: 4.48 MIL/uL (ref 4.40–5.90)
RDW: 16.4 % — AB (ref 11.5–14.5)
WBC: 9 10*3/uL (ref 3.8–10.6)

## 2017-07-24 LAB — ECHOCARDIOGRAM COMPLETE
HEIGHTINCHES: 72 in
WEIGHTICAEL: 2814.83 [oz_av]

## 2017-07-24 LAB — CYTOLOGY - NON PAP

## 2017-07-24 MED ORDER — MORPHINE SULFATE (PF) 2 MG/ML IV SOLN
2.0000 mg | INTRAVENOUS | Status: DC | PRN
  Administered 2017-07-25 – 2017-07-26 (×8): 2 mg via INTRAVENOUS
  Filled 2017-07-24 (×9): qty 1

## 2017-07-24 NOTE — Assessment & Plan Note (Signed)
Stable.  No recent complications.  Having regular BMs.  On MiraLAX 17 g p.o. daily and as needed senna S, sorbitol or Dulcolax suppositories.

## 2017-07-24 NOTE — Progress Notes (Signed)
[facility] [place] NAME: Jacob Reyes    MR#:  [id number]  DATE OF BIRTH:  11/07/1925  SUBJECTIVE:  CHIEF COMPLAINT:   Chief Complaint  Patient presents with  . Aspiration  Continue labored breathing, on BiPAP  REVIEW OF SYSTEMS:  CONSTITUTIONAL: No fever, fatigue or weakness.  EYES: No blurred or double vision.  EARS, NOSE, AND THROAT: No tinnitus or ear pain.  RESPIRATORY: No cough, shortness of breath, wheezing or hemoptysis.  CARDIOVASCULAR: No chest pain, orthopnea, edema.  GASTROINTESTINAL: No nausea, vomiting, diarrhea or abdominal pain.  GENITOURINARY: No dysuria, hematuria.  ENDOCRINE: No polyuria, nocturia,  HEMATOLOGY: No anemia, easy bruising or bleeding SKIN: No rash or lesion. MUSCULOSKELETAL: No joint pain or arthritis.   NEUROLOGIC: No tingling, numbness, weakness.  PSYCHIATRY: No anxiety or depression.   ROS  DRUG ALLERGIES:   Allergies  Allergen Reactions  . Carvedilol Other (See Comments)  . Duloxetine Other (See Comments)    Passing out  . Paroxetine Hcl Other (See Comments)    Hypotension  . Zaleplon Other (See Comments)    VITALS:  Blood pressure (!) 86/69, pulse (!) 102, temperature 98.6 F (37 C), temperature source Axillary, resp. rate (!) 29, height 6' (1.829 m), weight 79.8 kg (175 lb 14.8 oz), SpO2 95 %.  PHYSICAL EXAMINATION:  GENERAL:  84 y.o.-year-old patient lying in the bed with no acute distress.  EYES: Pupils equal, round, reactive to light and accommodation. No scleral icterus. Extraocular muscles intact.  HEENT: Head atraumatic, normocephalic. Oropharynx and nasopharynx clear.  NECK:  Supple, no jugular venous distention. No thyroid enlargement, no tenderness.  LUNGS: Normal breath sounds bilaterally, no wheezing, rales,rhonchi or crepitation. No use of accessory muscles of respiration.  CARDIOVASCULAR: S1, S2 normal. No murmurs, rubs, or gallops.  ABDOMEN: Soft, nontender,  nondistended. Bowel sounds present. No organomegaly or mass.  EXTREMITIES: No pedal edema, cyanosis, or clubbing.  NEUROLOGIC: Cranial nerves II through XII are intact. Muscle strength 5/5 in all extremities. Sensation intact. Gait not checked.  PSYCHIATRIC: The patient is alert and oriented x 3.  SKIN: No obvious rash, lesion, or ulcer.   Physical Exam LABORATORY PANEL:   CBC Recent Labs  Lab 07/24/17 0444  WBC 9.0  HGB 14.0  HCT 41.7  PLT 160   ------------------------------------------------------------------------------------------------------------------  Chemistries  Recent Labs  Lab 08/07/2017 1857 07/22/17 0259  NA 142 142  K 4.2 3.8  CL 106 112*  CO2 24 18*  GLUCOSE 192* 94  BUN 25* 27*  CREATININE 0.92 0.78  CALCIUM 9.1 8.4*  AST 56*  --   ALT 29  --   ALKPHOS 147*  --   BILITOT 2.2*  --    ------------------------------------------------------------------------------------------------------------------  Cardiac Enzymes Recent Labs  Lab 07/20/2017 1857  TROPONINI 0.03*   ------------------------------------------------------------------------------------------------------------------  RADIOLOGY:  Dg Chest 1 View  Result Date: 07/23/2017 CLINICAL DATA:  Status post left-sided thoracentesis. EXAM: CHEST 1 VIEW COMPARISON:  Chest x-ray of today's date prior to thoracentesis. FINDINGS: There is an approximately 60-70% left-sided pneumothorax. No significant pleural fluid is noted on the left. On the right there is persistent increased interstitial density throughout the lung especially in the upper lobes. The heart is normal in size. The sternal wires are intact from previous CABG. There is calcification in the wall of the thoracic aorta. IMPRESSION: 60-70% left-sided pneumothorax following thoracentesis. This is likely an ex vacuo pneumothorax given the uncomplicated tap and lack of any re-expansion of the left  lung. Stable appearance of the right lung with  persistent diffuse interstitial densities. No mediastinal shift. The report of the pneumothorax was called to the ultrasound department and the report given to the ultrasound technologist, Vicente Males, at 12:15 p.m. on 23 July 2017. There was subsequent discussion by me with Dr. Kathlene Cote. Electronically Signed   By: David  Martinique M.D.   On: 07/23/2017 12:16   Dg Abd 1 View  Result Date: 07/23/2017 CLINICAL DATA:  Abdominal distension EXAM: ABDOMEN - 1 VIEW COMPARISON:  CT abdomen dated 08/15/2017 FINDINGS: Persistently dilated gas-filled bowel throughout the abdomen and pelvis, corresponding to the distension of the entire colon demonstrated on recent CT, without significant interval change. No evidence of soft tissue mass or abnormal fluid collection. No evidence of free intraperitoneal air seen. Degenerative changes are seen throughout the scoliotic thoracolumbar spine. No acute or suspicious osseous finding. IMPRESSION: Distended gas-filled loops of bowel throughout the abdomen and pelvis, compatible with the distention of the entire colon demonstrated on recent CT, without significant interval change. Electronically Signed   By: Franki Cabot M.D.   On: 07/23/2017 07:24   Dg Chest Port 1 View  Result Date: 07/23/2017 CLINICAL DATA:  Pleural effusion. EXAM: PORTABLE CHEST 1 VIEW COMPARISON:  Chest x-rays dated 08/09/2017 in 09/15/2015. FINDINGS: Persistent left-sided pleural effusion, moderate to large in size. Again noted is suspected underlying atelectasis or other airspace consolidation. There are new bilateral interstitial and alveolar airspace opacities, perihilar and upper lobe predominant, suggesting pulmonary edema. Probable stable atelectasis at the right lung base. Heart size and mediastinal contours are stable. Atherosclerotic changes again noted at the aortic arch. Median sternotomy wires appear intact and stable in alignment. No acute or suspicious osseous finding. IMPRESSION: 1. New bilateral  interstitial and alveolar opacities, perihilar and upper lobe predominant, most suggestive of pulmonary edema pattern, compatible with CHF/volume overload. If febrile, differential would also include atypical pneumonia such as fungal and viral. 2. Persistent left-sided pleural effusion, moderate to large in size, not significantly changed compared to the recent exam of 08/07/2017. 3. Suspected underlying atelectasis at the left lung base. Again, if febrile, underlying pneumonia could not be excluded. 4. Stable cardiomegaly. 5. Aortic atherosclerosis. Electronically Signed   By: Franki Cabot M.D.   On: 07/23/2017 07:18   US Thoracentesis Asp Pleural Space W/img Guide  Result Date: 07/23/2017 CLINICAL DATA:  Left pleural effusion. EXAM: ULTRASOUND GUIDED THORACENTESIS COMPARISON:  None. PROCEDURE: An ultrasound guided thoracentesis was thoroughly discussed with the patient's wife and questions answered. The benefits, risks, alternatives and complications were also discussed. The patient's wife understands and wishes to proceed with the procedure. Written consent was obtained. Ultrasound was performed to localize and mark an adequate pocket of fluid in the left chest. The area was then prepped and draped in the normal sterile fashion. 1% Lidocaine was used for local anesthesia. Under ultrasound guidance a 6 French Safe-T-Centesis catheter was introduced. Thoracentesis was performed. The catheter was removed and a dressing applied. COMPLICATIONS: None FINDINGS: The procedure was performed at the bedside in the intensive care unit as the patient is in respiratory distress and on continuous BiPAP. A total of approximately 1.2 L of bloody fluid was removed. A fluid sample was sent for laboratory analysis. Towards the end of fluid removal, there was return of air from the catheter. This may be consistent with lack of re-expansion of the lung and ex vacuo pneumothorax. A follow-up chest x-ray will be performed.  IMPRESSION: Successful ultrasound guided left thoracentesis yielding  1.2 L of bloody pleural fluid. Lack of re-expansion of the left lung is suspected due to return of air towards the end of fluid removal. A postprocedural chest x-ray was ordered and will be performed. Electronically Signed   By: Aletta Edouard M.D.   On: 07/23/2017 13:02    ASSESSMENT AND PLAN:  1.Acute sepsis  Due to HCAP Resolved Treated on our sepsisprotocol, IV Zosyn   2. Acute hypoxic and hypercarbic respiratory failureandacidosis Stable ContinueBiPAPw/ weaning as tolerated, s/p thoracentesis w/ 1.2 L taken off  DNR noted  3.Acute fecal impaction Resolved CT noted for large stool burden Continue bowel regiment  4.Acute left pleural effusion S/p U/S guided thoracentesis with 1.2 L taken off 3/4  5. Chronic GERDw/esophagitis  Stable  PPI daily  6. HxCAD Stable on current regiment  7. Hx Afib Stable  8. Hxof colon cancer  Stable  9. Hx of prostate cancer Stable  10. Hypothyroidism, chronic Stable Continuelevothyroxine  All the records are reviewed and case discussed with Care Management/Social Workerr. Management plans discussed with the patient, family and they are in agreement.  CODE STATUS: dnr  TOTAL TIME TAKING CARE OF THIS PATIENT: 35 minutes.     POSSIBLE D/C IN 2-5 DAYS, DEPENDING ON CLINICAL CONDITION.   Avel Peace Taren Toops M.D on 07/24/2017   Between 7am to 6pm - Pager - [phone]  After 6pm go to [email or web address] - password [email or web address] [facility]  Office  [phone]  CC: Primary care physician; Rusty Aus, MD  Note: This dictation was prepared with Dragon dictation along with smaller phrase technology. Any transcriptional errors that result from this process are unintentional.

## 2017-07-24 NOTE — Progress Notes (Signed)
Pharmacy Antibiotic Note  Jacob Reyes is a 84 y.o. male admitted on 08/12/2017 with pneumonia.  Pharmacy has been consulted for zosyn dosing.  Plan: Will continue Zosyn 3.375 g EI q 8 hours.   Height: 6' (182.9 cm) Weight: 175 lb 14.8 oz (79.8 kg) IBW/kg (Calculated) : 77.6  Temp (24hrs), Avg:98.1 F (36.7 C), Min:97.7 F (36.5 C), Max:98.6 F (37 C)  Recent Labs  Lab 08/07/2017 1857 08/02/2017 2052 07/22/17 0259 07/24/17 0444  WBC 20.4*  --  9.9 9.0  CREATININE 0.92  --  0.78  --   LATICACIDVEN 3.4* 1.9  --   --     Estimated Creatinine Clearance: 67.4 mL/min (by C-G formula based on SCr of 0.78 mg/dL).    Allergies  Allergen Reactions  . Carvedilol Other (See Comments)  . Duloxetine Other (See Comments)    Passing out  . Paroxetine Hcl Other (See Comments)    Hypotension  . Zaleplon Other (See Comments)    Antimicrobials this admission: Anti-infectives (From admission, onward)   Start     Dose/Rate Route Frequency Ordered Stop   07/22/17 0200  piperacillin-tazobactam (ZOSYN) IVPB 3.375 g     3.375 g 12.5 mL/hr over 240 Minutes Intravenous Every 8 hours 07/25/2017 2124     08/18/2017 2300  vancomycin (VANCOCIN) 1,500 mg in sodium chloride 0.9 % 500 mL IVPB  Status:  Discontinued     1,500 mg 250 mL/hr over 120 Minutes Intravenous Every 24 hours 08/16/2017 2124 07/23/17 1040   08/09/2017 1915  piperacillin-tazobactam (ZOSYN) IVPB 3.375 g     3.375 g 100 mL/hr over 30 Minutes Intravenous  Once 07/30/2017 1904 07/24/2017 1954   08/12/2017 1915  vancomycin (VANCOCIN) IVPB 1000 mg/200 mL premix     1,000 mg 200 mL/hr over 60 Minutes Intravenous  Once 08/02/2017 1904 08/06/2017 2028       Microbiology results: Recent Results (from the past 240 hour(s))  Blood culture (routine x 2)     Status: None (Preliminary result)   Collection Time: 07/29/2017  6:46 PM  Result Value Ref Range Status   Specimen Description BLOOD LEFT ANTECUBITAL  Final   Special Requests   Final    BOTTLES  DRAWN AEROBIC AND ANAEROBIC Blood Culture adequate volume   Culture   Final    NO GROWTH 3 DAYS Performed at [facility], [address], [place], [place] [address]    Report Status PENDING  Incomplete  Blood culture (routine x 2)     Status: None (Preliminary result)   Collection Time: 08/03/2017  6:46 PM  Result Value Ref Range Status   Specimen Description BLOOD BLOOD RIGHT WRIST  Final   Special Requests   Final    BOTTLES DRAWN AEROBIC AND ANAEROBIC Blood Culture adequate volume   Culture   Final    NO GROWTH 3 DAYS Performed at [facility], [address] [place], [place] [address]    Report Status PENDING  Incomplete  MRSA PCR Screening     Status: None   Collection Time: 08/12/2017  9:52 PM  Result Value Ref Range Status   MRSA by PCR NEGATIVE NEGATIVE Final    Comment:        The GeneXpert MRSA Assay (FDA approved for NASAL specimens only), is one component of a comprehensive MRSA colonization surveillance program. It is not intended to diagnose MRSA infection nor to guide or monitor treatment for MRSA infections. Performed at [facility], [place],  [place], [place] [address]   Body fluid culture     Status: None (Preliminary result)   Collection Time: 07/23/17 11:15 AM  Result Value Ref Range Status   Specimen Description   Final    PLEURAL Performed at [facility], [address] [place], [place] [address]    Special Requests   Final    NONE Performed at [facility], [address] [place], [place] [address]    Gram Stain   Final    FEW WBC PRESENT, PREDOMINANTLY MONONUCLEAR NO ORGANISMS SEEN    Culture   Final    NO GROWTH < 24 HOURS Performed at [facility], [place] [address] [place], [place] [address]    Report Status PENDING  Incomplete  ]   Thank you for allowing pharmacy to be a part of this patient's care.  Napoleon Form 07/24/2017 12:53 PM

## 2017-07-24 NOTE — Assessment & Plan Note (Signed)
Stable.  On levothyroxine 75 mcg p.o. daily before breakfast.  Will check TSH prior to next visit.

## 2017-07-24 NOTE — Progress Notes (Signed)
Location:    [facility]ursing Home Room Number: 563J Place of Service:  SNF (31) Provider:  Toni Arthurs, NP-C  Rusty Aus, MD  Patient Care Team: Rusty Aus, MD as PCP - General (Internal Medicine) Ubaldo Glassing Javier Docker, MD as Consulting Physician (Cardiology) Minna Merritts, MD as Consulting Physician (Cardiology)  Extended Emergency Contact Information Primary Emergency Contact: [name] Address: [address]          [place], [place] [address] [name] of [place] Phone: [phone] Mobile Phone: [phone] Relation: Spouse Secondary Emergency Contact: [place] Phone: [phone] Relation: Daughter  Code Status: DNR Goals of care: Advanced Directive information Advanced Directives 07/23/2017  Does Patient Have a Medical Advance Directive? Yes  Type of Advance Directive Out of facility DNR (pink MOST or yellow form)  Does patient want to make changes to medical advance directive? No - Patient declined  Copy of [place] in Chart? -  Would patient like information on creating a medical advance directive? No - Patient declined  Pre-existing out of facility DNR order (yellow form or pink MOST form) -     Chief Complaint  Patient presents with  . Medical Management of Chronic Issues    Routine Visit    HPI:  Pt is a 84 y.o. male seen today for medical management of chronic diseases.    Gastroesophageal reflux disease without esophagitis Stable.  No recent exacerbations of symptoms.  Symptoms controlled on Prilosec 20 mg daily.  Monitoring closely as he is a high aspiration risk.  Cancer of colon- remote surgery Stable.  No recent complications.  Having regular BMs.  On MiraLAX 17 g p.o. daily and as needed senna S, sorbitol or Dulcolax suppositories.  Hypothyroidism Stable.  On levothyroxine 75 mcg p.o. daily before breakfast.  Will check TSH prior to next visit.  Currently, patient remains on  hospice services.  However, due to stability, patient will transition off of hospice in the coming weeks.  Patient will then be followed by palliative care.   Past Medical History:  Diagnosis Date  . Abnormal stress test   . Acquired hypothyroidism    unspecified  . AF (paroxysmal atrial fibrillation) ([place])   . Anxiety    just started med. for anxiety   . Cancer [facility])    prostate  . Colon cancer ([place]) 11/16/2013   partial colectomy  . Coronary artery disease   . Depression    unspecified  . GERD (gastroesophageal reflux disease)   . Glaucoma   . Hearing loss   . History of hiatal hernia   . Hyperlipidemia 11/16/2013  . Hypertension   . Pernicious anemia   . Prostate cancer ([place]) 11/16/2013   1/8 bx pos, waiting  . Reflux   . Reflux esophagitis 11/16/2013   Past Surgical History:  Procedure Laterality Date  . CARDIAC CATHETERIZATION N/A 12/10/2014   Procedure: Left Heart Cath and Coronary Angiography;  Surgeon: Teodoro Spray, MD;  Location: [place] [facility];  Service: Cardiovascular;  Laterality: N/A;  . CATARACT EXTRACTION    . COLON SURGERY    . COLONOSCOPY    . CORONARY ARTERY BYPASS GRAFT N/A 12/28/2014   Procedure: CORONARY ARTERY BYPASS GRAFTING (CABG), ON PUMP, TIMES SIX, USING LEFT INTERNAL MAMMARY ARTERY, RIGHT GREATER SAPHENOUS VEIN HARVESTED ENDOSCOPICALLY;  Surgeon: [name], MD;  Location: [place];  Service: Open Heart Surgery;  Laterality: N/A;  -SEQ LIMA to MID LAD and DISTAL  LAD -SVG to DIAGONAL -SEQ SVG to OM1 and OM2 -SVG to PDA  . EYE SURGERY     catarcats removed bilataeral /w IOL  . FOOT SURGERY Right 2009   Shorten metatarsal  . HERNIA REPAIR Bilateral 1991   inguinal   . PARTIAL COLECTOMY     WITH ANANSTOMSIS  . TEE WITHOUT CARDIOVERSION N/A 12/28/2014   Procedure: TRANSESOPHAGEAL ECHOCARDIOGRAM (TEE);  Surgeon: [name], MD;  Location: [place];  Service: Open Heart Surgery;  Laterality: N/A;  . TONSILLECTOMY      Allergies    Allergen Reactions  . Carvedilol Other (See Comments)  . Duloxetine Other (See Comments)    Passing out  . Paroxetine Hcl Other (See Comments)    Hypotension  . Zaleplon Other (See Comments)    Allergies as of 06/27/2017      Reactions   Carvedilol Other (See Comments)   Duloxetine Other (See Comments)   Passing out   Paroxetine Hcl Other (See Comments)   Hypotension   Zaleplon Other (See Comments)      Medication List        Accurate as of 06/27/17 11:59 PM. Always use your most recent med list.          acetaminophen 500 MG tablet Commonly known as:  TYLENOL Take 500 mg by mouth every 6 (six) hours as needed.   alum & mag hydroxide-simeth 599-357-01 MG/5ML suspension Commonly known as:  MAALOX PLUS Take 30 mLs by mouth every 4 (four) hours as needed for indigestion.   benzonatate 200 MG capsule Commonly known as:  TESSALON Take 200 mg by mouth every 8 (eight) hours as needed for cough.   bisacodyl 10 MG suppository Commonly known as:  DULCOLAX Place 10 mg rectally daily as needed.   BOUDREAUXS BUTT PASTE 16 % Oint Generic drug:  Zinc Oxide Apply liberal amount topically to area of skin irritation as needed. Ok to leave at bedside.   brimonidine 0.2 % ophthalmic solution Commonly known as:  ALPHAGAN Place 1 drop into both eyes 2 (two) times daily.   ENSURE Take 1 Can by mouth 2 (two) times daily between meals. ONLY GIVE IF HE IS NOT EATING. Family will provide the ensure.   GERI-TUSSIN DM 10-100 MG/5ML liquid Generic drug:  Dextromethorphan-guaiFENesin Take 5-10 mLs by mouth every 4 (four) hours as needed.   gluconic acid-citric acid irrigation Irrigate with 30 mLs as directed 2 (two) times daily. Clamp tubing for 30 minutes then unclamp and allow to flow   hydrocortisone cream 1 % Apply 1 application topically 4 (four) times daily as needed for itching. Apply to clean, dry hemorrhoids. Ok to change order to prn when resolved or pt request.    hydrocortisone cream 1 % Apply 1 application topically every Monday, Wednesday, and Friday. Apply to right ear   ipratropium-albuterol 0.5-2.5 (3) MG/3ML Soln Commonly known as:  DUONEB Take 3 mLs by nebulization 4 (four) times daily as needed.   ipratropium-albuterol 0.5-2.5 (3) MG/3ML Soln Commonly known as:  DUONEB Take 3 mLs by nebulization 3 (three) times daily.   latanoprost 0.005 % ophthalmic solution Commonly known as:  XALATAN Place 1 drop into both eyes at bedtime. Outside pharmacy wife will provide them   levothyroxine 75 MCG tablet Commonly known as:  SYNTHROID, LEVOTHROID Take 75 mcg by mouth daily before breakfast.   LUMIGAN 0.01 % Soln Generic drug:  bimatoprost Place 1 drop into both eyes at bedtime. Wife will supply from outside pharmacy.  Let wife know supply is low.   magnesium hydroxide 400 MG/5ML suspension Commonly known as:  MILK OF MAGNESIA Take 30 mLs by mouth every 4 (four) hours as needed for mild constipation. If constipation / no BM for 2 days   midodrine 5 MG tablet Commonly known as:  PROAMATINE Take 5 mg by mouth 3 (three) times daily with meals.   mirtazapine 15 MG tablet Commonly known as:  REMERON Take 15 mg by mouth at bedtime.   morphine 20 MG/ML concentrated solution Commonly known as:  ROXANOL Give 0.25 ml -0.5 ml by mouth every hour as needed for mild to severe pain. 0.25 = mild pain 0.5 = severe pain   nystatin powder Commonly known as:  MYCOSTATIN/NYSTOP Apply thin film topically to groin rash 2 times daily as needed until healed   omeprazole 20 MG capsule Commonly known as:  PRILOSEC Take 20 mg by mouth daily before breakfast.   OXYGEN Inhale 2-5 L into the lungs continuous. Titrate to maintain sats > 90 % and/or for comfort   polyethylene glycol powder powder Commonly known as:  GLYCOLAX/MIRALAX Take 17 g by mouth daily as needed.   SENNA-PLUS 8.6-50 MG tablet Generic drug:  senna-docusate Take 1-2 tablets by mouth 2  (two) times daily as needed.   sertraline 100 MG tablet Commonly known as:  ZOLOFT Take 100 mg by mouth daily.   sorbitol 70 % solution Take 30 mLs by mouth daily as needed. Give every 2 hours until large BM, then change med to as needed   timolol 0.5 % ophthalmic solution Commonly known as:  BETIMOL Place 1 drop into both eyes 2 (two) times daily.   WHITE PETROLATUM-MINERAL OIL OP Place 1/4 ribbon in both eyes at bedtime at least 30 minutes after placing drops for glaucoma (for dry eyes)       Review of Systems  Constitutional: Negative for activity change, appetite change, chills, diaphoresis and fever.  HENT: Negative for congestion, mouth sores, nosebleeds, postnasal drip, sneezing, sore throat, trouble swallowing and voice change.   Respiratory: Negative for apnea, cough, choking, chest tightness, shortness of breath and wheezing.   Cardiovascular: Negative for chest pain, palpitations and leg swelling.  Gastrointestinal: Negative for abdominal distention, abdominal pain, constipation, diarrhea and nausea.  Genitourinary: Positive for difficulty urinating (indwelling foley catheter). Negative for dysuria, frequency and urgency.  Musculoskeletal: Negative for back pain, gait problem and myalgias. Arthralgias: typical arthritis.  Skin: Negative for color change, pallor, rash and wound.  Neurological: Positive for weakness. Negative for dizziness, tremors, syncope, speech difficulty, numbness and headaches.  Psychiatric/Behavioral: Negative for agitation and behavioral problems.  All other systems reviewed and are negative.   Immunization History  Administered Date(s) Administered  . Influenza Split 02/19/2015  . Influenza-Unspecified 02/10/2016, 02/18/2017  . PPD Test 08/08/2016   Pertinent  Health Maintenance Due  Topic Date Due  . PNA vac Low Risk Adult (1 of 2 - PCV13) 06/13/1992  . INFLUENZA VACCINE  Completed   Fall Risk  02/23/2015  Falls in the past year? No    Risk for fall due to : History of fall(s);Impaired balance/gait   Functional Status Survey:    Vitals:   06/27/17 1011  BP: (!) 138/92  Pulse: 72  Resp: 16  Temp: 97.6 F (36.4 C)  TempSrc: Oral  SpO2: 98%  Weight: 176 lb (79.8 kg)  Height: 6' 1"  (1.854 m)   Body mass index is 23.22 kg/m. Physical Exam  Constitutional: He is oriented  to person, place, and time. Vital signs are normal. He appears well-developed and well-nourished. He is active and cooperative. He does not appear ill. No distress.  HENT:  Head: Normocephalic and atraumatic.  Mouth/Throat: Uvula is midline, oropharynx is clear and moist and mucous membranes are normal. Mucous membranes are not pale, not dry and not cyanotic.  Eyes: Conjunctivae, EOM and lids are normal. Pupils are equal, round, and reactive to light.  Neck: Trachea normal, normal range of motion and full passive range of motion without pain. Neck supple. No JVD present. No tracheal deviation, no edema and no erythema present. No thyromegaly present.  Cardiovascular: Normal rate, regular rhythm, normal heart sounds, intact distal pulses and normal pulses. Exam reveals no gallop, no distant heart sounds and no friction rub.  No murmur heard. Pulses:      Dorsalis pedis pulses are 2+ on the right side, and 2+ on the left side.  No edema  Pulmonary/Chest: Effort normal and breath sounds normal. No accessory muscle usage. No respiratory distress. He has no decreased breath sounds. He has no wheezes. He has no rhonchi. He has no rales. He exhibits no tenderness.  Abdominal: Soft. Normal appearance and bowel sounds are normal. He exhibits no distension and no ascites. There is no tenderness.  Musculoskeletal: Normal range of motion. He exhibits no edema or tenderness.  Expected osteoarthritis, stiffness; Bilateral Calves soft, supple. Negative Homan's Sign. B- pedal pulses equal; generalized weakness, bedbound  Neurological: He is alert and oriented to  person, place, and time. He has normal strength.  Skin: Skin is warm, dry and intact. He is not diaphoretic. No cyanosis. No pallor. Nails show no clubbing.  Psychiatric: He has a normal mood and affect. His speech is normal and behavior is normal. Judgment and thought content normal. Cognition and memory are normal.  Nursing note and vitals reviewed.   Labs reviewed: Recent Labs    08/07/2017 1857 07/22/17 0259  NA 142 142  K 4.2 3.8  CL 106 112*  CO2 24 18*  GLUCOSE 192* 94  BUN 25* 27*  CREATININE 0.92 0.78  CALCIUM 9.1 8.4*   Recent Labs    08/16/2017 1857  AST 56*  ALT 29  ALKPHOS 147*  BILITOT 2.2*  PROT 7.8  ALBUMIN 4.0   Recent Labs    08/19/2017 1857 07/22/17 0259 07/24/17 0444  WBC 20.4* 9.9 9.0  NEUTROABS 15.0*  --  7.9*  HGB 16.1 14.9 14.0  HCT 49.6 44.4 41.7  MCV 93.9 92.9 93.1  PLT 245 170 160   Lab Results  Component Value Date   TSH 2.093 07/22/2017   Lab Results  Component Value Date   HGBA1C 5.7 (H) 12/25/2014   No results found for: CHOL, HDL, LDLCALC, LDLDIRECT, TRIG, CHOLHDL  Significant Diagnostic Results in last 30 days:  Ct Abdomen Pelvis Wo Contrast  Result Date: 08/05/2017 CLINICAL DATA:  Abdominal distention. The patient aspirated soup today. EXAM: CT ABDOMEN AND PELVIS WITHOUT CONTRAST TECHNIQUE: Multidetector CT imaging of the abdomen and pelvis was performed following the standard protocol without IV contrast. COMPARISON:  CT abdomen and pelvis 10/02/2005. Single-view of the chest today. FINDINGS: Lower chest: Moderate left and small right pleural effusions are identified. There is some high-density material within the dependent lung parenchyma bilaterally which may represent aspirated substances. There is airspace disease in the lower lobes bilaterally, worse on the left. Patchy airspace disease is also seen in the imaged right middle lobe. Visualized esophagus is fluid-filled. Heart size  is upper normal. No pericardial effusion. Calcific  aortic and coronary atherosclerosis noted. Hepatobiliary: No focal liver abnormality is seen. No gallstones, gallbladder wall thickening, or biliary dilatation. Pancreas: Unremarkable. No pancreatic ductal dilatation or surrounding inflammatory changes. Marked fatty replacement of the pancreas noted. Spleen: Normal in size without focal abnormality. Adrenals/Urinary Tract: Urinary bladder is decompressed with a catheter in place. A large cyst off the upper pole of the left kidney was scattered calcifications within it is unchanged in appearance. The kidneys are otherwise unremarkable. Adrenal glands appear normal. Stomach/Bowel: The colon is markedly distended throughout. A very large volume of stool is present in the rectosigmoid colon. Prominent stool is also identified in the ascending and proximal transverse colon. A fluid level is seen in the mid to distal transverse colon. Nondependent gas is seen in a short segment of colon in the left abdomen as on image 50. Small hiatal hernia is noted. No evidence of small bowel obstruction. Bilateral inguinal hernias contain loops of bowel and a small volume of fluid. No pneumatosis or portal venous gas is identified. No free intraperitoneal air. Vascular/Lymphatic: Atherosclerosis without aneurysm. No lymphadenopathy. Reproductive: Prostate is unremarkable. Other: No ascites. Musculoskeletal: No lytic or sclerotic lesion. S shaped scoliosis and multilevel degenerative change noted. No fracture. IMPRESSION: Marked gaseous distension of the colon throughout with a massive volume of stool in the rectosigmoid consistent with fecal impaction. A short segment of colon in the left abdomen demonstrates nondependent gas which could be within stool or could be due to pneumatosis. There is no portal venous gas or other sign of bowel ischemia and this finding may be incidental. Bilateral lower lobe airspace disease and effusions consistent with pneumonia are worse on the left. There  also appears to be some aspirated material within the lungs. Visualized esophagus is fluid-filled throughout consistent with reflux and/or poor motility. Bilateral inguinal hernias contain loops of bowel without obstruction. Electronically Signed   By: Inge Rise M.D.   On: 08/02/2017 22:10   Dg Chest 1 View  Result Date: 07/23/2017 CLINICAL DATA:  Status post left-sided thoracentesis. EXAM: CHEST 1 VIEW COMPARISON:  Chest x-ray of today's date prior to thoracentesis. FINDINGS: There is an approximately 60-70% left-sided pneumothorax. No significant pleural fluid is noted on the left. On the right there is persistent increased interstitial density throughout the lung especially in the upper lobes. The heart is normal in size. The sternal wires are intact from previous CABG. There is calcification in the wall of the thoracic aorta. IMPRESSION: 60-70% left-sided pneumothorax following thoracentesis. This is likely an ex vacuo pneumothorax given the uncomplicated tap and lack of any re-expansion of the left lung. Stable appearance of the right lung with persistent diffuse interstitial densities. No mediastinal shift. The report of the pneumothorax was called to the ultrasound department and the report given to the ultrasound technologist, Vicente Males, at 12:15 p.m. on 23 July 2017. There was subsequent discussion by me with Dr. Kathlene Cote. Electronically Signed   By: David  [place]ique M.D.   On: 07/23/2017 12:16   Dg Abd 1 View  Result Date: 07/23/2017 CLINICAL DATA:  Abdominal distension EXAM: ABDOMEN - 1 VIEW COMPARISON:  CT abdomen dated 08/08/2017 FINDINGS: Persistently dilated gas-filled bowel throughout the abdomen and pelvis, corresponding to the distension of the entire colon demonstrated on recent CT, without significant interval change. No evidence of soft tissue mass or abnormal fluid collection. No evidence of free intraperitoneal air seen. Degenerative changes are seen throughout the scoliotic thoracolumbar  spine. No  acute or suspicious osseous finding. IMPRESSION: Distended gas-filled loops of bowel throughout the abdomen and pelvis, compatible with the distention of the entire colon demonstrated on recent CT, without significant interval change. Electronically Signed   By: Franki Cabot M.D.   On: 07/23/2017 07:24   Dg Chest Port 1 View  Result Date: 07/23/2017 CLINICAL DATA:  Pleural effusion. EXAM: PORTABLE CHEST 1 VIEW COMPARISON:  Chest x-rays dated 08/18/2017 in 09/15/2015. FINDINGS: Persistent left-sided pleural effusion, moderate to large in size. Again noted is suspected underlying atelectasis or other airspace consolidation. There are new bilateral interstitial and alveolar airspace opacities, perihilar and upper lobe predominant, suggesting pulmonary edema. Probable stable atelectasis at the right lung base. Heart size and mediastinal contours are stable. Atherosclerotic changes again noted at the aortic arch. Median sternotomy wires appear intact and stable in alignment. No acute or suspicious osseous finding. IMPRESSION: 1. New bilateral interstitial and alveolar opacities, perihilar and upper lobe predominant, most suggestive of pulmonary edema pattern, compatible with CHF/volume overload. If febrile, differential would also include atypical pneumonia such as fungal and viral. 2. Persistent left-sided pleural effusion, moderate to large in size, not significantly changed compared to the recent exam of 07/28/2017. 3. Suspected underlying atelectasis at the left lung base. Again, if febrile, underlying pneumonia could not be excluded. 4. Stable cardiomegaly. 5. Aortic atherosclerosis. Electronically Signed   By: Franki Cabot M.D.   On: 07/23/2017 07:18   Dg Chest Portable 1 View  Result Date: 07/23/2017 CLINICAL DATA:  84 year old male with history of aspiration. EXAM: PORTABLE CHEST 1 VIEW COMPARISON:  Chest x-ray 09/15/2015. FINDINGS: Complete opacification throughout the mid to lower left  hemithorax, compatible with large left pleural effusion with underlying atelectasis and/or airspace consolidation. Probable subsegmental atelectasis at the right lung base. Low lung volumes. No evidence of pulmonary edema. Cardiac silhouette is partially obscured but heart size appears normal. The patient is rotated to the left on today's exam, resulting in distortion of the mediastinal contours and reduced diagnostic sensitivity and specificity for mediastinal pathology. Atherosclerosis in the thoracic aorta. Status post median sternotomy for CABG. IMPRESSION: 1. Large left pleural effusion. 2. Atelectasis and/or airspace consolidation throughout the left mid to lower lung. 3. Right lower lobe subsegmental atelectasis. 4. Aortic atherosclerosis. Electronically Signed   By: Vinnie Langton M.D.   On: 07/30/2017 19:44   US Thoracentesis Asp Pleural Space W/img Guide  Result Date: 07/23/2017 CLINICAL DATA:  Left pleural effusion. EXAM: ULTRASOUND GUIDED THORACENTESIS COMPARISON:  None. PROCEDURE: An ultrasound guided thoracentesis was thoroughly discussed with the patient's wife and questions answered. The benefits, risks, alternatives and complications were also discussed. The patient's wife understands and wishes to proceed with the procedure. Written consent was obtained. Ultrasound was performed to localize and mark an adequate pocket of fluid in the left chest. The area was then prepped and draped in the normal sterile fashion. 1% Lidocaine was used for local anesthesia. Under ultrasound guidance a 6 French Safe-T-Centesis catheter was introduced. Thoracentesis was performed. The catheter was removed and a dressing applied. COMPLICATIONS: None FINDINGS: The procedure was performed at the bedside in the intensive care unit as the patient is in respiratory distress and on continuous BiPAP. A total of approximately 1.2 L of bloody fluid was removed. A fluid sample was sent for laboratory analysis. Towards the  end of fluid removal, there was return of air from the catheter. This may be consistent with lack of re-expansion of the lung and ex vacuo pneumothorax. A follow-up chest  x-ray will be performed. IMPRESSION: Successful ultrasound guided left thoracentesis yielding 1.2 L of bloody pleural fluid. Lack of re-expansion of the left lung is suspected due to return of air towards the end of fluid removal. A postprocedural chest x-ray was ordered and will be performed. Electronically Signed   By: Aletta Edouard M.D.   On: 07/23/2017 13:02    Assessment/Plan [place] was seen today for medical management of chronic issues.  Diagnoses and all orders for this visit:  Gastroesophageal reflux disease without esophagitis  Malignant neoplasm of colon, unspecified part of colon ([place])  Hypothyroidism, unspecified type   Above conditions stable  Continue current medication regimen  Continue care under hospice services until hospice discharges for stability  Then, consult palliative care services for monitoring  Labs prior to next month assessment  Continue routine care  Family/ staff Communication:   Total Time:  Documentation:  Face to Face:  Family/Phone:   Labs/tests ordered: CBC, met C, TSH, magnesium, vitamin B12, vitamin D, lipid panel prior to next month visit  Medication list reviewed and assessed for continued appropriateness. Monthly medication orders reviewed and signed.  Vikki Ports, NP-C [facility]iatric[facility] [phone], [place] [address] Cell Phone (Mon-Fri 8am-5pm):  [phone] On Call:  [phone] & follow prompts after 5pm & weekends Office Phone:  [phone] Office Fax:  [phone]

## 2017-07-24 NOTE — Progress Notes (Signed)
Location:      Place of Service:  SNF (31) Provider:  Toni Arthurs, NP-C  Rusty Aus, MD  Patient Care Team: Rusty Aus, MD as PCP - General (Internal Medicine) Jacob Reyes Javier Docker, MD as Consulting Physician (Cardiology) Minna Merritts, MD as Consulting Physician (Cardiology)  Extended Emergency Contact Information Primary Emergency Contact: [name] Address: [address]          [place], [place] [address] [name] Phone: [phone] Mobile Phone: [phone] Relation: Spouse Secondary Emergency Contact: [place] of [place] Phone: [phone] Relation: Daughter  Code Status: DNR Goals of care: Advanced Directive information Advanced Directives 07/23/2017  Does Patient Have a Medical Advance Directive? Yes  Type of Advance Directive Out of facility DNR (pink MOST or yellow form)  Does patient want to make changes to medical advance directive? No - Patient declined  Copy of [place] in Chart? -  Would patient like information on creating a medical advance directive? No - Patient declined  Pre-existing out of facility DNR order (yellow form or pink MOST form) -     Chief Complaint  Patient presents with  . Acute Visit    Abdominal distention    HPI:  Pt is a 84 y.o. male seen today for an acute visit for probable ileus.  Patient reports an increase in abdominal discomfort today.  Abdomen appears somewhat distended and firm, but pliable.  Patient reports some discomfort, but not pain, with palpation of abdomen.  Patient reports he had a small BM this morning.  Appetite is somewhat diminished today.  Bowel sounds present but hypoactive.  Patient does have a fairly recent history of an ileus as well as chronic constipation.  Family prefers to defer x-rays unless initial treatment ineffective.  At this time, patient denies nausea, vomiting, diarrhea, fever, chills, headache, chest pain, shortness of  breath, dizziness, cough, or congestion.  Patient is alert and oriented and is otherwise in his normal state of health.  Vital signs stable.  No other complaints.    Past Medical History:  Diagnosis Date  . Abnormal stress test   . Acquired hypothyroidism    unspecified  . AF (paroxysmal atrial fibrillation) ([place])   . Anxiety    just started med. for anxiety   . Cancer [facility])    prostate  . Colon cancer ([place]) 11/16/2013   partial colectomy  . Coronary artery disease   . Depression    unspecified  . GERD (gastroesophageal reflux disease)   . Glaucoma   . Hearing loss   . History of hiatal hernia   . Hyperlipidemia 11/16/2013  . Hypertension   . Pernicious anemia   . Prostate cancer ([place]) 11/16/2013   1/8 bx pos, waiting  . Reflux   . Reflux esophagitis 11/16/2013   Past Surgical History:  Procedure Laterality Date  . CARDIAC CATHETERIZATION N/A 12/10/2014   Procedure: Left Heart Cath and Coronary Angiography;  Surgeon: Teodoro Spray, MD;  Location: [place] CV LAB;  Service: Cardiovascular;  Laterality: N/A;  . CATARACT EXTRACTION    . COLON SURGERY    . COLONOSCOPY    . CORONARY ARTERY BYPASS GRAFT N/A 12/28/2014   Procedure: CORONARY ARTERY BYPASS GRAFTING (CABG), ON PUMP, TIMES SIX, USING LEFT INTERNAL MAMMARY ARTERY, RIGHT GREATER SAPHENOUS VEIN HARVESTED ENDOSCOPICALLY;  Surgeon: [name], MD;  Location: [place];  Service: Open Heart Surgery;  Laterality: N/A;  -SEQ LIMA to MID  LAD and DISTAL LAD -SVG to DIAGONAL -SEQ SVG to OM1 and OM2 -SVG to PDA  . EYE SURGERY     catarcats removed bilataeral /w IOL  . FOOT SURGERY Right 2009   Shorten metatarsal  . HERNIA REPAIR Bilateral 1991   inguinal   . PARTIAL COLECTOMY     WITH ANANSTOMSIS  . TEE WITHOUT CARDIOVERSION N/A 12/28/2014   Procedure: TRANSESOPHAGEAL ECHOCARDIOGRAM (TEE);  Surgeon: [name], MD;  Location: [place];  Service: Open Heart Surgery;  Laterality: N/A;  . TONSILLECTOMY       Allergies  Allergen Reactions  . Carvedilol Other (See Comments)  . Duloxetine Other (See Comments)    Passing out  . Paroxetine Hcl Other (See Comments)    Hypotension  . Zaleplon Other (See Comments)    Allergies as of 07/20/2017      Reactions   Carvedilol Other (See Comments)   Duloxetine Other (See Comments)   Passing out   Paroxetine Hcl Other (See Comments)   Hypotension   Zaleplon Other (See Comments)      Medication List    Notice   This visit is during an admission. Changes to the med list made in this visit will be reflected in the After Visit Summary of the admission.     Review of Systems  Constitutional: Positive for appetite change (mildly). Negative for activity change, chills, diaphoresis and fever.  HENT: Negative for congestion, mouth sores, nosebleeds, postnasal drip, sneezing, sore throat, trouble swallowing and voice change.   Respiratory: Negative for apnea, cough, choking, chest tightness, shortness of breath and wheezing.   Cardiovascular: Negative for chest pain, palpitations and leg swelling.  Gastrointestinal: Positive for abdominal distention and abdominal pain (discomfort). Negative for constipation, diarrhea, nausea, rectal pain and vomiting.  Genitourinary: Negative for difficulty urinating (chronic indwelling foley), dysuria, frequency and urgency.  Musculoskeletal: Negative for back pain, gait problem and myalgias. Arthralgias: typical arthritis.  Skin: Negative for color change, pallor, rash and wound.  Neurological: Negative for dizziness, tremors, syncope, speech difficulty, weakness, numbness and headaches.  Psychiatric/Behavioral: Negative for agitation and behavioral problems.  All other systems reviewed and are negative.   Immunization History  Administered Date(s) Administered  . Influenza Split 02/19/2015  . Influenza-Unspecified 02/10/2016, 02/18/2017  . PPD Test 08/08/2016   Pertinent  Health Maintenance Due  Topic Date  Due  . PNA vac Low Risk Adult (1 of 2 - PCV13) 06/13/1992  . INFLUENZA VACCINE  Completed   Fall Risk  02/23/2015  Falls in the past year? No  Risk for fall due to : History of fall(s);Impaired balance/gait   Functional Status Survey:    Vitals:   07/20/17 1030  BP: 120/86  Pulse: 95  Resp: 20  Temp: 98.1 F (36.7 C)  SpO2: 95%  Weight: 176 lb (79.8 kg)   Body mass index is 23.22 kg/m. Physical Exam  Constitutional: He is oriented to person, place, and time. Vital signs are normal. He appears well-developed and well-nourished. He is active and cooperative. He does not appear ill. No distress.  HENT:  Head: Normocephalic and atraumatic.  Mouth/Throat: Uvula is midline, oropharynx is clear and moist and mucous membranes are normal. Mucous membranes are not pale, not dry and not cyanotic.  Eyes: Conjunctivae, EOM and lids are normal. Pupils are equal, round, and reactive to light.  Neck: Trachea normal, normal range of motion and full passive range of motion without pain. Neck supple. No JVD present. No tracheal deviation, no  edema and no erythema present. No thyromegaly present.  Cardiovascular: Normal rate, regular rhythm, normal heart sounds, intact distal pulses and normal pulses. Exam reveals no gallop, no distant heart sounds and no friction rub.  No murmur heard. Pulses:      Dorsalis pedis pulses are 2+ on the right side, and 2+ on the left side.  No edema  Pulmonary/Chest: Effort normal and breath sounds normal. No accessory muscle usage. No respiratory distress. He has no decreased breath sounds. He has no wheezes. He has no rhonchi. He has no rales. He exhibits no tenderness.  Abdominal: Soft. Normal appearance. He exhibits distension. He exhibits no ascites. Bowel sounds are decreased. There is generalized tenderness (mild discomfort with palpation). There is no rigidity, no rebound, no guarding and no CVA tenderness.  Genitourinary:  Genitourinary Comments: Chronic  indwelling foley  Musculoskeletal: Normal range of motion. He exhibits no edema or tenderness.  Expected osteoarthritis, stiffness; Bilateral Calves soft, supple. Negative Homan's Sign. B- pedal pulses equal; generalized weakness, chronicaly bedbound  Neurological: He is alert and oriented to person, place, and time. He has normal strength. He displays atrophy. He exhibits abnormal muscle tone. Coordination and gait abnormal.  Skin: Skin is warm, dry and intact. He is not diaphoretic. No cyanosis. No pallor. Nails show no clubbing.  Psychiatric: He has a normal mood and affect. His speech is normal and behavior is normal. Judgment and thought content normal. Cognition and memory are normal.  Nursing note and vitals reviewed.   Labs reviewed: Recent Labs    08/12/2017 1857 07/22/17 0259  NA 142 142  K 4.2 3.8  CL 106 112*  CO2 24 18*  GLUCOSE 192* 94  BUN 25* 27*  CREATININE 0.92 0.78  CALCIUM 9.1 8.4*   Recent Labs    07/20/2017 1857  AST 56*  ALT 29  ALKPHOS 147*  BILITOT 2.2*  PROT 7.8  ALBUMIN 4.0   Recent Labs    08/03/2017 1857 07/22/17 0259 07/24/17 0444  WBC 20.4* 9.9 9.0  NEUTROABS 15.0*  --  7.9*  HGB 16.1 14.9 14.0  HCT 49.6 44.4 41.7  MCV 93.9 92.9 93.1  PLT 245 170 160   Lab Results  Component Value Date   TSH 2.093 07/22/2017   Lab Results  Component Value Date   HGBA1C 5.7 (H) 12/25/2014   No results found for: CHOL, HDL, LDLCALC, LDLDIRECT, TRIG, CHOLHDL  Significant Diagnostic Results in last 30 days:  Ct Abdomen Pelvis Wo Contrast  Result Date: 08/15/2017 CLINICAL DATA:  Abdominal distention. The patient aspirated soup today. EXAM: CT ABDOMEN AND PELVIS WITHOUT CONTRAST TECHNIQUE: Multidetector CT imaging of the abdomen and pelvis was performed following the standard protocol without IV contrast. COMPARISON:  CT abdomen and pelvis 10/02/2005. Single-view of the chest today. FINDINGS: Lower chest: Moderate left and small right pleural effusions are  identified. There is some high-density material within the dependent lung parenchyma bilaterally which may represent aspirated substances. There is airspace disease in the lower lobes bilaterally, worse on the left. Patchy airspace disease is also seen in the imaged right middle lobe. Visualized esophagus is fluid-filled. Heart size is upper normal. No pericardial effusion. Calcific aortic and coronary atherosclerosis noted. Hepatobiliary: No focal liver abnormality is seen. No gallstones, gallbladder wall thickening, or biliary dilatation. Pancreas: Unremarkable. No pancreatic ductal dilatation or surrounding inflammatory changes. Marked fatty replacement of the pancreas noted. Spleen: Normal in size without focal abnormality. Adrenals/Urinary Tract: Urinary bladder is decompressed with a catheter in place.  A large cyst off the upper pole of the left kidney was scattered calcifications within it is unchanged in appearance. The kidneys are otherwise unremarkable. Adrenal glands appear normal. Stomach/Bowel: The colon is markedly distended throughout. A very large volume of stool is present in the rectosigmoid colon. Prominent stool is also identified in the ascending and proximal transverse colon. A fluid level is seen in the mid to distal transverse colon. Nondependent gas is seen in a short segment of colon in the left abdomen as on image 50. Small hiatal hernia is noted. No evidence of small bowel obstruction. Bilateral inguinal hernias contain loops of bowel and a small volume of fluid. No pneumatosis or portal venous gas is identified. No free intraperitoneal air. Vascular/Lymphatic: Atherosclerosis without aneurysm. No lymphadenopathy. Reproductive: Prostate is unremarkable. Other: No ascites. Musculoskeletal: No lytic or sclerotic lesion. S shaped scoliosis and multilevel degenerative change noted. No fracture. IMPRESSION: Marked gaseous distension of the colon throughout with a massive volume of stool in the  rectosigmoid consistent with fecal impaction. A short segment of colon in the left abdomen demonstrates nondependent gas which could be within stool or could be due to pneumatosis. There is no portal venous gas or other sign of bowel ischemia and this finding may be incidental. Bilateral lower lobe airspace disease and effusions consistent with pneumonia are worse on the left. There also appears to be some aspirated material within the lungs. Visualized esophagus is fluid-filled throughout consistent with reflux and/or poor motility. Bilateral inguinal hernias contain loops of bowel without obstruction. Electronically Signed   By: Inge Rise M.D.   On: 08/07/2017 22:10   Dg Chest 1 View  Result Date: 07/23/2017 CLINICAL DATA:  Status post left-sided thoracentesis. EXAM: CHEST 1 VIEW COMPARISON:  Chest x-ray of today's date prior to thoracentesis. FINDINGS: There is an approximately 60-70% left-sided pneumothorax. No significant pleural fluid is noted on the left. On the right there is persistent increased interstitial density throughout the lung especially in the upper lobes. The heart is normal in size. The sternal wires are intact from previous CABG. There is calcification in the wall of the thoracic aorta. IMPRESSION: 60-70% left-sided pneumothorax following thoracentesis. This is likely an ex vacuo pneumothorax given the uncomplicated tap and lack of any re-expansion of the left lung. Stable appearance of the right lung with persistent diffuse interstitial densities. No mediastinal shift. The report of the pneumothorax was called to the ultrasound department and the report given to the ultrasound technologist, Vicente Males, at 12:15 p.m. on 23 July 2017. There was subsequent discussion by me with Dr. Kathlene Cote. Electronically Signed   By: David  Martinique M.D.   On: 07/23/2017 12:16   Dg Abd 1 View  Result Date: 07/23/2017 CLINICAL DATA:  Abdominal distension EXAM: ABDOMEN - 1 VIEW COMPARISON:  CT abdomen dated  07/25/2017 FINDINGS: Persistently dilated gas-filled bowel throughout the abdomen and pelvis, corresponding to the distension of the entire colon demonstrated on recent CT, without significant interval change. No evidence of soft tissue mass or abnormal fluid collection. No evidence of free intraperitoneal air seen. Degenerative changes are seen throughout the scoliotic thoracolumbar spine. No acute or suspicious osseous finding. IMPRESSION: Distended gas-filled loops of bowel throughout the abdomen and pelvis, compatible with the distention of the entire colon demonstrated on recent CT, without significant interval change. Electronically Signed   By: Franki Cabot M.D.   On: 07/23/2017 07:24   Dg Chest Port 1 View  Result Date: 07/23/2017 CLINICAL DATA:  Pleural effusion. EXAM:  PORTABLE CHEST 1 VIEW COMPARISON:  Chest x-rays dated 08/06/2017 in 09/15/2015. FINDINGS: Persistent left-sided pleural effusion, moderate to large in size. Again noted is suspected underlying atelectasis or other airspace consolidation. There are new bilateral interstitial and alveolar airspace opacities, perihilar and upper lobe predominant, suggesting pulmonary edema. Probable stable atelectasis at the right lung base. Heart size and mediastinal contours are stable. Atherosclerotic changes again noted at the aortic arch. Median sternotomy wires appear intact and stable in alignment. No acute or suspicious osseous finding. IMPRESSION: 1. New bilateral interstitial and alveolar opacities, perihilar and upper lobe predominant, most suggestive of pulmonary edema pattern, compatible with CHF/volume overload. If febrile, differential would also include atypical pneumonia such as fungal and viral. 2. Persistent left-sided pleural effusion, moderate to large in size, not significantly changed compared to the recent exam of 07/27/2017. 3. Suspected underlying atelectasis at the left lung base. Again, if febrile, underlying pneumonia could not  be excluded. 4. Stable cardiomegaly. 5. Aortic atherosclerosis. Electronically Signed   By: Franki Cabot M.D.   On: 07/23/2017 07:18   Dg Chest Portable 1 View  Result Date: 07/28/2017 CLINICAL DATA:  84 year old male with history of aspiration. EXAM: PORTABLE CHEST 1 VIEW COMPARISON:  Chest x-ray 09/15/2015. FINDINGS: Complete opacification throughout the mid to lower left hemithorax, compatible with large left pleural effusion with underlying atelectasis and/or airspace consolidation. Probable subsegmental atelectasis at the right lung base. Low lung volumes. No evidence of pulmonary edema. Cardiac silhouette is partially obscured but heart size appears normal. The patient is rotated to the left on today's exam, resulting in distortion of the mediastinal contours and reduced diagnostic sensitivity and specificity for mediastinal pathology. Atherosclerosis in the thoracic aorta. Status post median sternotomy for CABG. IMPRESSION: 1. Large left pleural effusion. 2. Atelectasis and/or airspace consolidation throughout the left mid to lower lung. 3. Right lower lobe subsegmental atelectasis. 4. Aortic atherosclerosis. Electronically Signed   By: Vinnie Langton M.D.   On: 08/11/2017 19:44   US Thoracentesis Asp Pleural Space W/img Guide  Result Date: 07/23/2017 CLINICAL DATA:  Left pleural effusion. EXAM: ULTRASOUND GUIDED THORACENTESIS COMPARISON:  None. PROCEDURE: An ultrasound guided thoracentesis was thoroughly discussed with the patient's wife and questions answered. The benefits, risks, alternatives and complications were also discussed. The patient's wife understands and wishes to proceed with the procedure. Written consent was obtained. Ultrasound was performed to localize and mark an adequate pocket of fluid in the left chest. The area was then prepped and draped in the normal sterile fashion. 1% Lidocaine was used for local anesthesia. Under ultrasound guidance a 6 French Safe-T-Centesis catheter was  introduced. Thoracentesis was performed. The catheter was removed and a dressing applied. COMPLICATIONS: None FINDINGS: The procedure was performed at the bedside in the intensive care unit as the patient is in respiratory distress and on continuous BiPAP. A total of approximately 1.2 L of bloody fluid was removed. A fluid sample was sent for laboratory analysis. Towards the end of fluid removal, there was return of air from the catheter. This may be consistent with lack of re-expansion of the lung and ex vacuo pneumothorax. A follow-up chest x-ray will be performed. IMPRESSION: Successful ultrasound guided left thoracentesis yielding 1.2 L of bloody pleural fluid. Lack of re-expansion of the left lung is suspected due to return of air towards the end of fluid removal. A postprocedural chest x-ray was ordered and will be performed. Electronically Signed   By: Aletta Edouard M.D.   On: 07/23/2017 13:02  Assessment/Plan Shrihaan was seen today for acute visit.  Diagnoses and all orders for this visit:  Ileus of unspecified type ([place])    Insert and maintain peripheral IV for infusion of fluids  Sodium chloride 0.9%-give 250 mL bolus over 1 hour, then continuous at 50 mL/h x 72 hours for ileus  Metoclopramide 5 mg/mL- 1 mL IV every 6 hours x12 doses  Clear liquid diet  May give p.o. meds with sips of clears  Patient is to remain on clear liquid diet until he passes flatus  Family/ staff Communication:    Total Time:  Documentation:  Face to Face:  Family/Phone:   Labs/tests ordered: None at this time  Medication list reviewed and assessed for continued appropriateness.  Vikki Ports, NP-C Geriatrics [facility] [phone], [place] [address] Cell Phone (Mon-Fri 8am-5pm):  [phone] On Call:  [phone] & follow prompts after 5pm & weekends Office Phone:  [phone] Office Fax:  [phone]

## 2017-07-24 NOTE — Progress Notes (Signed)
*  PRELIMINARY RESULTS* Echocardiogram 2D Echocardiogram has been performed.  Jacob Reyes 07/24/2017, 12:25 PM

## 2017-07-24 NOTE — Assessment & Plan Note (Signed)
Stable.  No recent exacerbations of symptoms.  Symptoms controlled on Prilosec 20 mg daily.  Monitoring closely as he is a high aspiration risk.

## 2017-07-24 NOTE — Consult Note (Signed)
[facility]  PATIENT NAME: Jacob Reyes   MR#:  [id number] DATE OF BIRTH:  10-20-1925 DATE OF ADMISSION:  08/02/2017 PRIMARY CARE PHYSICIAN: Dr Frazier Richards  REQUESTING/REFERRING PHYSICIAN: Dr Eliott Nine   CHIEF COMPLAINT:   Chief Complaint  Patient presents with  . Aspiration    HISTORY OF PRESENT ILLNESS:  the patient is a DNR/DNI   biPAP remains on 100% high flow [place] +BM's Less distended today Still with resp failure Can not be weaned off of biPAP Severe resp failure     Review of Systems: UNABLE TO PROVIDE DUE TO RESP DISTRESS Other:  All other systems negative   VITAL SIGNS:  Blood pressure (!) 81/64, pulse 100, temperature 98.6 F (37 C), temperature source Axillary, resp. rate (!) 29, height 6' (1.829 m), weight 175 lb 14.8 oz (79.8 kg), SpO2 92 %.  PHYSICAL EXAMINATION:  GENERAL:  84 y.o.-year-old patient lying in the bed with acute respiratory distress. On BIPAP EYES: Pupils equal, round, reactive to light and accommodation. No scleral icterus. HEENT: Head atraumatic, normocephalic. Oropharynx and nasopharynx clear.  NECK:  Supple, no jugular venous distention. No thyroid enlargement, no tenderness.  LUNGS: Decreased breath sounds bilaterally, upper airway wheezing, rhonchi bilateral base. No use of accessory muscles of respiration.  CARDIOVASCULAR: S1, S2 irregularly irregular tachycardic. No murmurs, rubs, or gallops.  ABDOMEN:less distended. Decreased Bowel sounds absent. No organomegaly or mass.  EXTREMITIES: 2+ edema, no cyanosis, or clubbing.  NEUROLOGIC: Patient able to move his arms on his own.  Lethargic PSYCHIATRIC: The patient is lethargic.  SKIN:  Chronic lower extremity discoloration.  Bruising lower extremities and upper extremities  LABORATORY PANEL:   CBC Recent Labs  Lab 07/24/17 0444  WBC 9.0  HGB 14.0  HCT 41.7  PLT 160    ------------------------------------------------------------------------------------------------------------------  Chemistries  Recent Labs  Lab 08/13/2017 1857 07/22/17 0259  NA 142 142  K 4.2 3.8  CL 106 112*  CO2 24 18*  GLUCOSE 192* 94  BUN 25* 27*  CREATININE 0.92 0.78  CALCIUM 9.1 8.4*  AST 56*  --   ALT 29  --   ALKPHOS 147*  --   BILITOT 2.2*  --    ------------------------------------------------------------------------------------------------------------------  Cardiac Enzymes Recent Labs  Lab 07/24/2017 1857  TROPONINI 0.03*     EKG:   Sinus tachycardia 112 bpm nonspecific ST-T wave changes.  IMPRESSION AND PLAN:  84 yo white male with multiple medical issues with acute resp failure with acute fecal impaction and abd distention with acute left sided opacity with  pneumonia with effusion and pulm edema    1.Respiratory Failure -patient is DNR/DNI Oxygen as needed Unable to wean off of biPAP High flow [place] as tolerated  2.Pneumonia-aspiration/HCAP Continue IV abx NEB therapy BiPAP and oxygen  3. Acute hypoxic and hypercarbic respiratory failure  And acidosis.   Patient currently on 100% on the BiPAP.  Patient is a DO NOT RESUSCITATE/DNI  4.Abdominal distention, nausea and constipation-+fecal impaction S/p enema Place rectal tube for decompression  5.left sided pleural effusion Chronic No need for chest tube  6.Lactic acidosis likely from sepsis -IVF's follow up [place]   CODE STATUS: DNR/DNI   Critical Care Time devoted to patient care services described in this note is 32 minutes.   Overall, patient is critically ill, prognosis is guarded.  Patient with Multiorgan failure and at high risk for cardiac arrest and death.   Palliative care team consulted  Kevin Mario Patricia Pesa, M.D.  [facility] Pulmonary & Critical  Care Medicine  Medical Director [place] Director [facility] Cardio-Pulmonary Department

## 2017-07-24 NOTE — Progress Notes (Addendum)
Patient requested BiPap off, bridge of nose very sore even with silicone pad.  Pt placed on HFNC 50 L/100% FIO2.  He is tolerating well and enjoying talking with his family, ice chips and apple sauce for comfort.  O2 sats 85-88%.  No excessive WOB, pt reports he feels much better.  Continue to monitor  Per Dr Alva Garnet it is acceptable for patient to be on HFNC with O2 sats in 80s as long as patient is comfortable and reports he is in no distress

## 2017-07-25 DIAGNOSIS — J690 Pneumonitis due to inhalation of food and vomit: Secondary | ICD-10-CM

## 2017-07-25 DIAGNOSIS — Z9889 Other specified postprocedural states: Secondary | ICD-10-CM

## 2017-07-25 DIAGNOSIS — Z7189 Other specified counseling: Secondary | ICD-10-CM

## 2017-07-25 DIAGNOSIS — R140 Abdominal distension (gaseous): Secondary | ICD-10-CM

## 2017-07-25 DIAGNOSIS — Z515 Encounter for palliative care: Secondary | ICD-10-CM

## 2017-07-25 DIAGNOSIS — J90 Pleural effusion, not elsewhere classified: Secondary | ICD-10-CM

## 2017-07-25 LAB — BASIC METABOLIC PANEL
Anion gap: 11 (ref 5–15)
BUN: 43 mg/dL — ABNORMAL HIGH (ref 6–20)
CHLORIDE: 113 mmol/L — AB (ref 101–111)
CO2: 26 mmol/L (ref 22–32)
CREATININE: 0.97 mg/dL (ref 0.61–1.24)
Calcium: 8.6 mg/dL — ABNORMAL LOW (ref 8.9–10.3)
Glucose, Bld: 97 mg/dL (ref 65–99)
POTASSIUM: 3 mmol/L — AB (ref 3.5–5.1)
SODIUM: 150 mmol/L — AB (ref 135–145)

## 2017-07-25 LAB — MAGNESIUM: MAGNESIUM: 2.3 mg/dL (ref 1.7–2.4)

## 2017-07-25 LAB — PHOSPHORUS: PHOSPHORUS: 2.2 mg/dL — AB (ref 2.5–4.6)

## 2017-07-25 MED ORDER — POTASSIUM CHLORIDE 10 MEQ/100ML IV SOLN
10.0000 meq | INTRAVENOUS | Status: AC
  Administered 2017-07-25 (×4): 10 meq via INTRAVENOUS
  Filled 2017-07-25 (×4): qty 100

## 2017-07-25 MED ORDER — PANTOPRAZOLE SODIUM 40 MG IV SOLR
40.0000 mg | INTRAVENOUS | Status: DC
  Filled 2017-07-25: qty 40

## 2017-07-25 MED ORDER — POTASSIUM PHOSPHATES 15 MMOLE/5ML IV SOLN
10.0000 mmol | Freq: Once | INTRAVENOUS | Status: AC
  Administered 2017-07-25: 10 mmol via INTRAVENOUS
  Filled 2017-07-25: qty 3.33

## 2017-07-25 NOTE — Progress Notes (Signed)
[facility] NAME: Jacob Reyes    MR#:  [id number]  DATE OF BIRTH:  April 24, 1926  SUBJECTIVE:  CHIEF COMPLAINT:   Chief Complaint  Patient presents with  . Aspiration  Discussion with critical care attending, possible need for comfort care discussion with family later today given lack of improvement  REVIEW OF SYSTEMS:  CONSTITUTIONAL: No fever, fatigue or weakness.  EYES: No blurred or double vision.  EARS, NOSE, AND THROAT: No tinnitus or ear pain.  RESPIRATORY: No cough, shortness of breath, wheezing or hemoptysis.  CARDIOVASCULAR: No chest pain, orthopnea, edema.  GASTROINTESTINAL: No nausea, vomiting, diarrhea or abdominal pain.  GENITOURINARY: No dysuria, hematuria.  ENDOCRINE: No polyuria, nocturia,  HEMATOLOGY: No anemia, easy bruising or bleeding SKIN: No rash or lesion. MUSCULOSKELETAL: No joint pain or arthritis.   NEUROLOGIC: No tingling, numbness, weakness.  PSYCHIATRY: No anxiety or depression.   ROS  DRUG ALLERGIES:   Allergies  Allergen Reactions  . Carvedilol Other (See Comments)  . Duloxetine Other (See Comments)    Passing out  . Paroxetine Hcl Other (See Comments)    Hypotension  . Zaleplon Other (See Comments)    VITALS:  Blood pressure 100/88, pulse 94, temperature 98.8 F (37.1 C), temperature source Axillary, resp. rate (!) 26, height 6' (1.829 m), weight 79.8 kg (175 lb 14.8 oz), SpO2 (!) 88 %.  PHYSICAL EXAMINATION:  GENERAL:  84 y.o.-year-old patient lying in the bed with no acute distress.  EYES: Pupils equal, round, reactive to light and accommodation. No scleral icterus. Extraocular muscles intact.  HEENT: Head atraumatic, normocephalic. Oropharynx and nasopharynx clear.  NECK:  Supple, no jugular venous distention. No thyroid enlargement, no tenderness.  LUNGS: Normal breath sounds bilaterally, no wheezing, rales,rhonchi or crepitation. No use of accessory muscles of respiration.   CARDIOVASCULAR: S1, S2 normal. No murmurs, rubs, or gallops.  ABDOMEN: Soft, nontender, nondistended. Bowel sounds present. No organomegaly or mass.  EXTREMITIES: No pedal edema, cyanosis, or clubbing.  NEUROLOGIC: Cranial nerves II through XII are intact. Muscle strength 5/5 in all extremities. Sensation intact. Gait not checked.  PSYCHIATRIC: The patient is alert and oriented x 3.  SKIN: No obvious rash, lesion, or ulcer.   Physical Exam LABORATORY PANEL:   CBC Recent Labs  Lab 07/24/17 0444  WBC 9.0  HGB 14.0  HCT 41.7  PLT 160   ------------------------------------------------------------------------------------------------------------------  Chemistries  Recent Labs  Lab 07/25/2017 1857  07/25/17 0628  NA 142   < > 150*  K 4.2   < > 3.0*  CL 106   < > 113*  CO2 24   < > 26  GLUCOSE 192*   < > 97  BUN 25*   < > 43*  CREATININE 0.92   < > 0.97  CALCIUM 9.1   < > 8.6*  MG  --   --  2.3  AST 56*  --   --   ALT 29  --   --   ALKPHOS 147*  --   --   BILITOT 2.2*  --   --    < > = values in this interval not displayed.   ------------------------------------------------------------------------------------------------------------------  Cardiac Enzymes Recent Labs  Lab 07/27/2017 1857  TROPONINI 0.03*   ------------------------------------------------------------------------------------------------------------------  RADIOLOGY:  No results found.  ASSESSMENT AND PLAN:  1.Acute sepsis  Due to HCAP Resolved Treated on our sepsisprotocol,IV Zosyn  2. Acute hypoxic and hypercarbic respiratory failureandacidosis No improvement ContinueBiPAPw/ weaning as tolerated,s/pthoracentesis  w/ 1.2 Ltaken off  DNR noted In discussion with intensivist-plans are for her to discuss with patient's family regarding comfort care/hospice going forward  3.Acute fecal impaction Resolved CT noted for large stool burden Continue bowel regiment  4.Acute left  pleural effusion S/pU/S guided thoracentesiswith 1.2 L taken off 3/4  5. Chronic GERDw/esophagitis  Stable  PPI daily  6. HxCAD Stable on current regiment  7. Hx Afib Stable  8. Hxof colon cancer  Stable  9. Hx of prostate cancer Stable  10. Hypothyroidism, chronic Stable Continuelevothyroxine   All the records are reviewed and case discussed with Care Management/Social Workerr. Management plans discussed with the patient, family and they are in agreement.  CODE STATUS: dnr  TOTAL TIME TAKING CARE OF THIS PATIENT: 35 minutes.     POSSIBLE D/C IN 1-3 DAYS, DEPENDING ON CLINICAL CONDITION.   Avel Peace Antonin Meininger M.D on 07/25/2017   Between 7am to 6pm - Pager - [phone]  After 6pm go to [email or web address] - password [email or web address]  Office  [phone]  CC: Primary care physician; Jacob Aus, MD  Note: This dictation was prepared with Dragon dictation along with smaller phrase technology. Any transcriptional errors that result from this process are unintentional.

## 2017-07-25 NOTE — Consult Note (Signed)
Consultation Note Date: 07/25/2017   Patient Name: Jacob Reyes  DOB: [birth year]  MRN: [id number]  Age / Sex: 84 y.o., male  PCP: Rusty Aus, MD Referring Physician: Gorden Harms, MD  Reason for Consultation: Establishing goals of care  HPI/Patient Profile: Jacob Reyes  is a 84 y.o. male with a known history of atrial fibrillation, coronary artery disease, gastroesophageal reflux disease, Parkinson-like syndrome. Admitted for PNA and abd distention.     Clinical Assessment and Goals of Care: In with Dr. Mortimer Fries for a [place] meeting. Wife and children at bedside. He has been residing at a nursing facility. He has not been able to walk for the past year. He shaves himself wit han Copy, but needs assistance with other ADL's.  He has been on a diet with nectar thick liquids. He was previously a hospice patient, but was recently discharged from hospice.     We discussed his diagnosis, prognosis, GOC, EOL wishes disposition and options.  Jacob Reyes does not like the BIPAP. Decision to not reapply the BIPAP, continue high flow cannula and make him comfort care if he declines. Patient has been hungry, but has only been eating sips and bites. Family understands risk of aspiration and wants to give him bites and sips for comfort. They would like to give him another 24-48 hours to see how he does.      SUMMARY OF RECOMMENDATIONS    Continue high flow cannula as able. No esculation to BIPAP. Deescualtion to comfort care if he does not tolerate cannula. Reevaluate in 24-48 hours.    Code Status/Advance Care Planning:  DNR    Symptom Management:   Morphine for air hunger.   Palliative Prophylaxis:   Oral Care   Prognosis:   Very poor. High flow nasal cannula with O2 sat in 80's and 90's. PNA. Parkinson's, constipation.   Discharge Planning: To Be Determined      Primary  Diagnoses: Present on Admission: . Acute respiratory failure with hypoxia and hypercarbia ([place])   I have reviewed the medical record, interviewed the patient and family, and examined the patient. The following aspects are pertinent.  Past Medical History:  Diagnosis Date  . Abnormal stress test   . Acquired hypothyroidism    unspecified  . AF (paroxysmal atrial fibrillation) ([place])   . Anxiety    just started med. for anxiety   . Cancer [facility])    prostate  . Colon cancer ([place]) 11/16/2013   partial colectomy  . Coronary artery disease   . Depression    unspecified  . GERD (gastroesophageal reflux disease)   . Glaucoma   . Hearing loss   . History of hiatal hernia   . Hyperlipidemia 11/16/2013  . Hypertension   . Pernicious anemia   . Prostate cancer ([place]) 11/16/2013   1/8 bx pos, waiting  . Reflux   . Reflux esophagitis 11/16/2013   Social History   Socioeconomic History  . Marital status: Married    Spouse name: None  .  Number of children: 4  . Years of education: college  . Highest education level: None  Social Needs  . Financial resource strain: None  . Food insecurity - worry: None  . Food insecurity - inability: None  . Transportation needs - medical: None  . Transportation needs - non-medical: None  Occupational History  . 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 Smoking status: Never Smoker  . Smokeless tobacco: Never Used  Substance and Sexual Activity  . Alcohol use: No  . Drug use: No  . Sexual activity: None  Other Topics Concern  . None  Social History Narrative   Admitted to [facility] 08/08/2016   Married with 4 children   Never smoker   No alcohol use   DNR   Family History  Problem Relation Age of Onset  . Coronary artery disease Mother   . Heart attack Mother   . Coronary artery disease Father        coronary thrombosis  . Heart attack Father   . Alzheimer's disease Sister   .  Glaucoma Sister   . Coronary artery disease Brother   . Heart attack Brother   . Sleep disorder Brother   . Cancer Maternal Grandfather   . Pancreatic cancer Sister    Scheduled Meds: . brimonidine  1 drop Both Eyes BID  . budesonide (PULMICORT) nebulizer solution  0.5 mg Nebulization BID  . chlorhexidine  15 mL Mouth Rinse BID  . enoxaparin (LOVENOX) injection  40 mg Subcutaneous Q24H  . ipratropium-albuterol  3 mL Nebulization Q4H  . latanoprost  1 drop Both Eyes QHS  . levothyroxine  25 mcg Intravenous QAC breakfast  . mouth rinse  15 mL Mouth Rinse q12n4p  . nystatin   Topical BID  . pantoprazole (PROTONIX) IV  40 mg Intravenous Q12H  . sorbitol, milk of mag, mineral oil, glycerin (SMOG) enema  960 mL Rectal BID  . timolol  1 drop Both Eyes BID   Continuous Infusions: . phenylephrine (NEO-SYNEPHRINE) Adult infusion    . piperacillin-tazobactam (ZOSYN)  IV Stopped (07/25/17 0931)  . potassium chloride 10 mEq (07/25/17 0931)  . potassium PHOSPHATE IVPB (mmol)     PRN Meds:.acetaminophen **OR** acetaminophen, bisacodyl, hydrocortisone cream, morphine injection, morphine CONCENTRATE, ondansetron **OR** ondansetron (ZOFRAN) IV, sodium phosphate Medications Prior to Admission:  Prior to Admission medications   Medication Sig Start Date End Date Taking? Authorizing Provider  acetaminophen (TYLENOL) 500 MG tablet Take 500 mg by mouth every 6 (six) hours as needed.   Yes [provider]  alum & mag hydroxide-simeth (MAALOX PLUS) 400-400-40 MG/5ML suspension Take 30 mLs by mouth every 4 (four) hours as needed for indigestion.   Yes [provider]  benzonatate (TESSALON) 200 MG capsule Take 200 mg by mouth every 8 (eight) hours as needed for cough.   Yes [provider]  bisacodyl (DULCOLAX) 10 MG suppository Place 10 mg rectally daily as needed.   Yes [provider]  brimonidine (ALPHAGAN) 0.2 % ophthalmic solution Place 1 drop into both eyes 2 (two)  times daily.   Yes [provider]  Dextromethorphan-Guaifenesin (GERI-TUSSIN DM) 10-100 MG/5ML liquid Take 5-10 mLs by mouth every 4 (four) hours as needed.   Yes [provider]  ENSURE (ENSURE) Take 1 Can by mouth 2 (two) times daily between meals. ONLY GIVE IF HE IS NOT EATING. Family will provide the ensure.   Yes [provider]  gluconic acid-citric acid (RENACIDIN) irrigation Irrigate with 30 mLs as directed 2 (two) times daily. Clamp tubing for 30 minutes then unclamp and allow to flow   Yes [provider]  hydrocortisone cream 1 % Apply 1 application topically 4 (four) times daily as needed for itching. Apply to clean, dry hemorrhoids. Ok to change order to prn when resolved or pt request.   Yes [provider]  hydrocortisone cream 1 % Apply 1 application topically every Monday, Wednesday, and Friday. Apply to right ear    Yes [provider]  ipratropium-albuterol (DUONEB) 0.5-2.5 (3) MG/3ML SOLN Take 3 mLs by nebulization 4 (four) times daily as needed.   Yes [provider]  latanoprost (XALATAN) 0.005 % ophthalmic solution Place 1 drop into both eyes at bedtime. Outside pharmacy wife will provide them   Yes [provider]  levothyroxine (SYNTHROID, LEVOTHROID) 75 MCG tablet Take 75 mcg by mouth daily before breakfast.   Yes [provider]  LUMIGAN 0.01 % SOLN Place 1 drop into both eyes at bedtime. Wife will supply from outside pharmacy. Let wife know supply is low. 06/03/13  Yes [provider]  magnesium hydroxide (MILK OF MAGNESIA) 400 MG/5ML suspension Take 30 mLs by mouth every 4 (four) hours as needed for mild constipation. If constipation / no BM for 2 days   Yes [provider]  metoCLOPramide (REGLAN) 5 MG/5ML solution Take 5 mg by mouth every 6 (six) hours.   Yes [provider]  midodrine (PROAMATINE) 5 MG tablet Take 5 mg by mouth 3 (three) times daily with meals.   Yes  [provider]  mirtazapine (REMERON) 15 MG tablet Take 15 mg by mouth at bedtime.   Yes [provider]  morphine (ROXANOL) 20 MG/ML concentrated solution Give 0.25 ml -0.5 ml by mouth every hour as needed for mild to severe pain. 0.25 = mild pain 0.5 = severe pain   Yes [provider]  nystatin (MYCOSTATIN/NYSTOP) powder Apply thin film topically to groin rash 2 times daily as needed until healed   Yes [provider]  omeprazole (PRILOSEC) 20 MG capsule Take 20 mg by mouth daily before breakfast.  06/10/13  Yes [provider]  OXYGEN Inhale 2-5 L into the lungs continuous. Titrate to maintain sats > 90 % and/or for comfort   Yes [provider]  polyethylene glycol powder (GLYCOLAX/MIRALAX) powder Take 17 g by mouth daily as needed.  06/18/13  Yes [provider]  senna-docusate (SENNA-PLUS) 8.6-50 MG tablet Take 1-2 tablets by mouth 2 (two) times daily as needed.   Yes [provider]  sertraline (ZOLOFT) 100 MG tablet Take 100 mg by mouth daily.   Yes [provider]  sorbitol 70 % solution Take 30 mLs by mouth daily as needed. Give every 2 hours until large BM, then change med to as needed   Yes [provider]  timolol (BETIMOL) 0.5 % ophthalmic solution Place 1 drop into both eyes 2 (two) times daily.   Yes [provider]  WHITE PETROLATUM-MINERAL OIL OP Place 1/4 ribbon in both eyes at bedtime at least 30 minutes after placing drops for glaucoma (for dry eyes)   Yes [provider]  Zinc Oxide (BOUDREAUXS BUTT PASTE) 16 % OINT Apply liberal amount topically to area of skin irritation as needed. Ok to leave at bedside.   Yes [provider]   Allergies  Allergen Reactions  . Carvedilol Other (See Comments)  .  Duloxetine Other (See Comments)    Passing out  . Paroxetine Hcl Other (See Comments)    Hypotension  . Zaleplon Other (See Comments)   Review of  Systems  Physical Exam  Vital Signs: BP 99/65   Pulse (!) 103   Temp 98.8 F (37.1 C) (Axillary)   Resp (!) 29   Ht 6' (1.829 m)   Wt 79.8 kg (175 lb 14.8 oz)   SpO2 (!) 88%   BMI 23.86 kg/m  Pain Assessment: CPOT POSS *See Group Information*: 1-Acceptable,Awake and alert Pain Score: 0-No pain   SpO2: SpO2: (!) 88 % O2 Device:SpO2: (!) 88 % O2 Flow Rate: .O2 Flow Rate (L/min): 50 L/min  IO: Intake/output summary:   Intake/Output Summary (Last 24 hours) at 07/25/2017 1001 Last data filed at 07/25/2017 0605 Gross per 24 hour  Intake 150 ml  Output 590 ml  Net -440 ml    LBM: Last BM Date: 07/24/17 Baseline Weight: Weight: 79.8 kg (176 lb) Most recent weight: Weight: 79.8 kg (175 lb 14.8 oz)     Palliative Assessment/Data: 20%     Time In: 9:00 Time Out: 10:10 Time Total: 70 min Greater than 50%  of this time was spent counseling and coordinating care related to the above assessment and plan.  Signed by: Asencion Gowda, NP   Please contact Palliative Medicine Team phone at [phone] for questions and concerns.  For individual provider: See Shea Evans

## 2017-07-25 NOTE — Consult Note (Signed)
[facility]  PATIENT NAME: Jacob Reyes   MR#:  [id number] DATE OF BIRTH:  1926/01/07 DATE OF ADMISSION:  08/03/2017 PRIMARY CARE PHYSICIAN: Dr Frazier Richards  REQUESTING/REFERRING PHYSICIAN: Dr Eliott Nine   CHIEF COMPLAINT:   Chief Complaint  Patient presents with  . Aspiration    HISTORY OF PRESENT ILLNESS:   the patient is a DNR/DNI   biPAP remains on 100% high flow [place] +BM's Less distended today Still with resp failure Severe resp failure  Plan for family meeting this AM   Review of Systems: UNABLE TO PROVIDE DUE TO RESP DISTRESS Other:  All other systems negative   VITAL SIGNS:  Blood pressure 99/65, pulse (!) 103, temperature 98.8 F (37.1 C), temperature source Axillary, resp. rate (!) 29, height 6' (1.829 m), weight 175 lb 14.8 oz (79.8 kg), SpO2 (!) 88 %.  PHYSICAL EXAMINATION:  GENERAL:  84 y.o.-year-old patient lying in the bed with acute respiratory distress. On BIPAP/high flow [place] EYES: Pupils equal, round, reactive to light and accommodation. No scleral icterus. HEENT: Head atraumatic, normocephalic. Oropharynx and nasopharynx clear.  NECK:  Supple, no jugular venous distention. No thyroid enlargement, no tenderness.  LUNGS: Decreased breath sounds bilaterally, upper airway wheezing, rhonchi bilateral base.+ use of accessory muscles of respiration.  CARDIOVASCULAR: S1, S2 irregularly irregular tachycardic. No murmurs, rubs, or gallops.  ABDOMEN:less distended. Decreased Bowel sounds absent. No organomegaly or mass.  EXTREMITIES: 2+ edema, no cyanosis, or clubbing.  NEUROLOGIC: Patient able to move his arms on his own.  Lethargic PSYCHIATRIC: The patient is lethargic.  SKIN:  Chronic lower extremity discoloration.  Bruising lower extremities and upper extremities  LABORATORY PANEL:   CBC Recent Labs  Lab 07/24/17 0444  WBC 9.0  HGB 14.0  HCT 41.7  PLT 160    ------------------------------------------------------------------------------------------------------------------  Chemistries  Recent Labs  Lab 08/10/2017 1857  07/25/17 0628  NA 142   < > 150*  K 4.2   < > 3.0*  CL 106   < > 113*  CO2 24   < > 26  GLUCOSE 192*   < > 97  BUN 25*   < > 43*  CREATININE 0.92   < > 0.97  CALCIUM 9.1   < > 8.6*  AST 56*  --   --   ALT 29  --   --   ALKPHOS 147*  --   --   BILITOT 2.2*  --   --    < > = values in this interval not displayed.   ------------------------------------------------------------------------------------------------------------------  Cardiac Enzymes Recent Labs  Lab 08/16/2017 1857  TROPONINI 0.03*      IMPRESSION AND PLAN:  84 yo white male with multiple medical issues with severe and acute resp failure with acute fecal impaction and abd distention with left sided opacity with  pneumonia with effusion and pulm edema    1.Respiratory Failure -patient is DNR/DNI Oxygen as needed High flow [place] as tolerated BiPAP as needed  2.Pneumonia-aspiration/HCAP Continue IV abx NEB therapy BiPAP and oxygen  3. Acute hypoxic and hypercarbic respiratory failure  And acidosis.   Patient currently on 100%   Patient is a DO NOT RESUSCITATE/DNI  4.Abdominal distention, nausea and constipation-+fecal impaction S/p enema Place rectal tube for decompression  5.left sided pleural effusion Chronic No need for chest tube  6.Lactic acidosis likely from sepsis -IVF's follow up [place]   CODE STATUS: DNR/DNI   Critical Care Time devoted to patient care services described in this note is  35 minutes.   Overall, patient is critically ill, prognosis is guarded.  Patient with Multiorgan failure and at high risk for cardiac arrest and death.   Palliative care team consulted Plan  To meet with family today to discuss goals of care  Ryanne Morand Patricia Pesa, M.D.  [facility]  Medical Director [place] Director [facility] Cardio-Pulmonary Department

## 2017-07-25 NOTE — Progress Notes (Signed)
Pharmacy Antibiotic Note  Jacob Reyes is a 84 y.o. male admitted on 08/11/2017 with pneumonia.  Pharmacy has been consulted for zosyn dosing.  Plan: Will continue Zosyn 3.375 g EI q 8 hours.   Height: 6' (182.9 cm) Weight: 175 lb 14.8 oz (79.8 kg) IBW/kg (Calculated) : 77.6  Temp (24hrs), Avg:98.6 F (37 C), Min:98.2 F (36.8 C), Max:98.8 F (37.1 C)  Recent Labs  Lab 08/12/2017 1857 07/25/2017 2052 07/22/17 0259 07/24/17 0444 07/25/17 0628  WBC 20.4*  --  9.9 9.0  --   CREATININE 0.92  --  0.78  --  0.97  LATICACIDVEN 3.4* 1.9  --   --   --     Estimated Creatinine Clearance: 55.6 mL/min (by C-G formula based on SCr of 0.97 mg/dL).    Allergies  Allergen Reactions  . Carvedilol Other (See Comments)  . Duloxetine Other (See Comments)    Passing out  . Paroxetine Hcl Other (See Comments)    Hypotension  . Zaleplon Other (See Comments)    Antimicrobials this admission: Anti-infectives (From admission, onward)   Start     Dose/Rate Route Frequency Ordered Stop   07/22/17 0200  piperacillin-tazobactam (ZOSYN) IVPB 3.375 g     3.375 g 12.5 mL/hr over 240 Minutes Intravenous Every 8 hours 07/22/2017 2124     07/24/2017 2300  vancomycin (VANCOCIN) 1,500 mg in sodium chloride 0.9 % 500 mL IVPB  Status:  Discontinued     1,500 mg 250 mL/hr over 120 Minutes Intravenous Every 24 hours 07/23/2017 2124 07/23/17 1040   08/01/2017 1915  piperacillin-tazobactam (ZOSYN) IVPB 3.375 g     3.375 g 100 mL/hr over 30 Minutes Intravenous  Once 07/28/2017 1904 08/07/2017 1954   08/08/2017 1915  vancomycin (VANCOCIN) IVPB 1000 mg/200 mL premix     1,000 mg 200 mL/hr over 60 Minutes Intravenous  Once 08/18/2017 1904 08/06/2017 2028       Microbiology results: Recent Results (from the past 240 hour(s))  Blood culture (routine x 2)     Status: None (Preliminary result)   Collection Time: 07/22/2017  6:46 PM  Result Value Ref Range Status   Specimen Description BLOOD LEFT ANTECUBITAL  Final   Special  Requests   Final    BOTTLES DRAWN AEROBIC AND ANAEROBIC Blood Culture adequate volume   Culture   Final    NO GROWTH 4 DAYS Performed at [facility], [address], [place], [place] [address]    Report Status PENDING  Incomplete  Blood culture (routine x 2)     Status: None (Preliminary result)   Collection Time: 07/23/2017  6:46 PM  Result Value Ref Range Status   Specimen Description BLOOD BLOOD RIGHT WRIST  Final   Special Requests   Final    BOTTLES DRAWN AEROBIC AND ANAEROBIC Blood Culture adequate volume   Culture   Final    NO GROWTH 4 DAYS Performed at [facility], [address] [place], [place] [address]    Report Status PENDING  Incomplete  MRSA PCR Screening     Status: None   Collection Time: 07/25/2017  9:52 PM  Result Value Ref Range Status   MRSA by PCR NEGATIVE NEGATIVE Final    Comment:        The GeneXpert MRSA Assay (FDA approved for NASAL specimens only), is one component of a comprehensive MRSA colonization surveillance program. It is not intended to diagnose MRSA infection nor to guide or monitor treatment for MRSA infections.  Performed at [facility], [address] [place], [place] [address]   Body fluid culture     Status: None (Preliminary result)   Collection Time: 07/23/17 11:15 AM  Result Value Ref Range Status   Specimen Description   Final    PLEURAL Performed at [facility], [address] [place], [place] [address]    Special Requests   Final    NONE Performed at [facility], [address] [place], [place] [address]    Gram Stain   Final    FEW WBC PRESENT, PREDOMINANTLY MONONUCLEAR NO ORGANISMS SEEN    Culture   Final    NO GROWTH 2 DAYS Performed at [facility], [place] [address], [place], [place] [address]    Report Status PENDING  Incomplete    Thank you for allowing pharmacy to be a part of this patient's care.  Candelaria Stagers, PharmD Pharmacy Resident   07/25/2017 1:31 PM

## 2017-07-25 NOTE — Progress Notes (Addendum)
MEDICATION RELATED CONSULT NOTE - INITIAL   Pharmacy Consult for Electrolyte Monitoring   Allergies  Allergen Reactions  . Carvedilol Other (See Comments)  . Duloxetine Other (See Comments)    Passing out  . Paroxetine Hcl Other (See Comments)    Hypotension  . Zaleplon Other (See Comments)    Labs: Recent Labs    07/24/17 0444 07/25/17 0628  WBC 9.0  --   HGB 14.0  --   HCT 41.7  --   PLT 160  --   CREATININE  --  0.97   Medical History: Past Medical History:  Diagnosis Date  . Abnormal stress test   . Acquired hypothyroidism    unspecified  . AF (paroxysmal atrial fibrillation) ([place])   . Anxiety    just started med. for anxiety   . Cancer [facility])    prostate  . Colon cancer ([place]) 11/16/2013   partial colectomy  . Coronary artery disease   . Depression    unspecified  . GERD (gastroesophageal reflux disease)   . Glaucoma   . Hearing loss   . History of hiatal hernia   . Hyperlipidemia 11/16/2013  . Hypertension   . Pernicious anemia   . Prostate cancer ([place]) 11/16/2013   1/8 bx pos, waiting  . Reflux   . Reflux esophagitis 11/16/2013    Assessment: 3/6 K: 3.0, Mg: 2.3, Ph: 2.2  Goal of Therapy:  Electrolytes WNL  Plan:  Will give KCl 64mEq IV x4 doses followed by KPhos 2mmol. Recheck electrolytes with AM labs.   Candelaria Stagers, PharmD Pharmacy Resident  07/25/2017,8:48 AM

## 2017-07-26 LAB — BODY FLUID CULTURE: Culture: NO GROWTH

## 2017-07-26 LAB — BASIC METABOLIC PANEL
ANION GAP: 11 (ref 5–15)
BUN: 45 mg/dL — AB (ref 6–20)
CALCIUM: 8.5 mg/dL — AB (ref 8.9–10.3)
CO2: 27 mmol/L (ref 22–32)
Chloride: 114 mmol/L — ABNORMAL HIGH (ref 101–111)
Creatinine, Ser: 0.89 mg/dL (ref 0.61–1.24)
GFR calc Af Amer: >60 mL/min (ref >60)
GFR calc non Af Amer: >60 mL/min (ref >60)
GLUCOSE: 114 mg/dL — AB (ref 65–99)
Potassium: 3 mmol/L — ABNORMAL LOW (ref 3.5–5.1)
SODIUM: 152 mmol/L — AB (ref 135–145)

## 2017-07-26 LAB — CULTURE, BLOOD (ROUTINE X 2)
Culture: NO GROWTH
Culture: NO GROWTH
SPECIAL REQUESTS: ADEQUATE
Special Requests: ADEQUATE

## 2017-07-26 LAB — MAGNESIUM: MAGNESIUM: 2.5 mg/dL — AB (ref 1.7–2.4)

## 2017-07-26 LAB — PHOSPHORUS: Phosphorus: 2.2 mg/dL — ABNORMAL LOW (ref 2.5–4.6)

## 2017-07-26 MED ORDER — MIDAZOLAM HCL 2 MG/2ML IJ SOLN: 2.0000 mg | INTRAMUSCULAR | Status: DC | PRN

## 2017-07-26 MED ORDER — LORAZEPAM 2 MG/ML IJ SOLN: 1.0000 mg | INTRAMUSCULAR | Status: DC | PRN

## 2017-07-26 MED ORDER — POTASSIUM CHLORIDE 10 MEQ/100ML IV SOLN
10.0000 meq | INTRAVENOUS | Status: DC
  Filled 2017-07-26 (×4): qty 100

## 2017-07-26 MED ORDER — MORPHINE 100MG IN NS 100ML (1MG/ML) PREMIX INFUSION: 0.0000 mg/h | INTRAVENOUS | Status: DC

## 2017-07-26 MED ORDER — MORPHINE 100MG IN NS 100ML (1MG/ML) PREMIX INFUSION
0.0000 mg/h | INTRAVENOUS | Status: DC
Start: 2017-07-26 — End: 2017-07-26

## 2017-07-26 MED ORDER — POTASSIUM PHOSPHATES 15 MMOLE/5ML IV SOLN
10.0000 mmol | Freq: Once | INTRAVENOUS | Status: DC
  Filled 2017-07-26: qty 3.33

## 2017-07-26 MED ORDER — LORAZEPAM 2 MG/ML IJ SOLN: 2.0000 mg | INTRAMUSCULAR | Status: DC | PRN

## 2017-07-26 MED ORDER — MORPHINE 100MG IN NS 100ML (1MG/ML) PREMIX INFUSION: 5.0000 mg/h | INTRAVENOUS | Status: DC

## 2017-07-26 MED ORDER — MORPHINE 100MG IN NS 100ML (1MG/ML) PREMIX INFUSION
0.0000 mg/h | INTRAVENOUS | Status: DC
  Administered 2017-07-26: 5 mg/h via INTRAVENOUS

## 2017-08-07 IMAGING — DX DG CHEST 1V PORT
1 series · 1 of 1 positions shown · non-contrast
Comparison: Chest x-ray 12/25/2014.

CLINICAL DATA: 89-year-old male status post CABG.

EXAM:
PORTABLE CHEST - 1 VIEW

[chest ap]
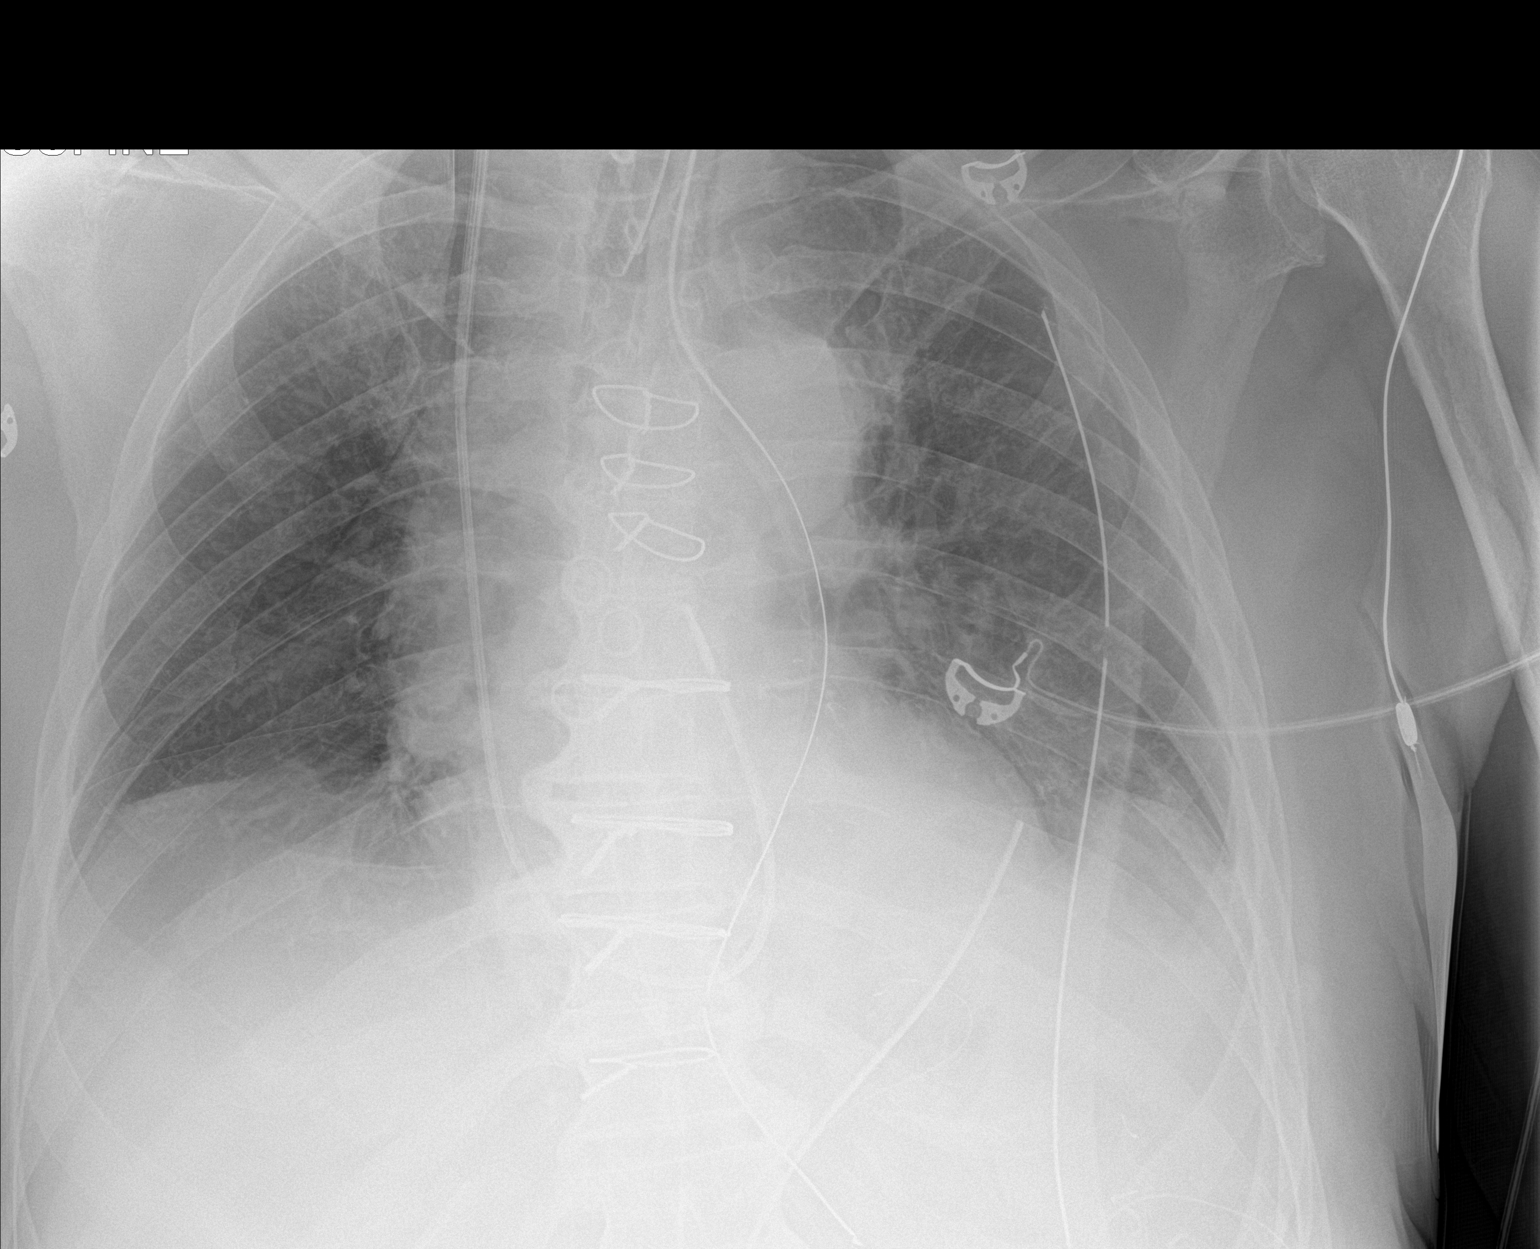

[1 of 1 positions shown; findings below may reference images not displayed]

FINDINGS: An endotracheal tube is in place with tip 6.9 cm above the carina. A
nasogastric tube is seen extending into the stomach, however, the
tip of the nasogastric tube extends below the lower margin of the
image. Right IJ Cordis through which a Swan-Ganz catheter has been
passed into the distal pulmonic trunk. Left-sided chest tube with
tip in position in the upper left hemithorax. Additional drainage
catheter slightly to the left of midline may represent an additional
left-sided chest tube or a pericardial drain. Epicardial pacing
wires are noted. Lung volumes are low. Bibasilar opacities
presumably represent some postoperative atelectasis with trace
bilateral pleural effusions. No pneumothorax. Crowding of the
pulmonary vasculature related to low lung volumes, but no frank
pulmonary edema. Heart size is normal. Mediastinal contours are
distorted by patient's rotation to the left. Status post median
sternotomy for CABG.
IMPRESSION: 1. Postoperative changes and support apparatus, as above.
2. Low lung volumes with mild bibasilar postoperative atelectasis,
and probable trace bilateral pleural effusions.

## 2017-08-08 IMAGING — CR DG CHEST 1V PORT
2 series · 2 of 2 positions shown · non-contrast
Comparison: 12/22/1978 [DATE] and earlier.

CLINICAL DATA: 89-year-old male status post CABG. Initial
encounter.

EXAM:
PORTABLE CHEST - 1 VIEW

[AP (1 of 2)]
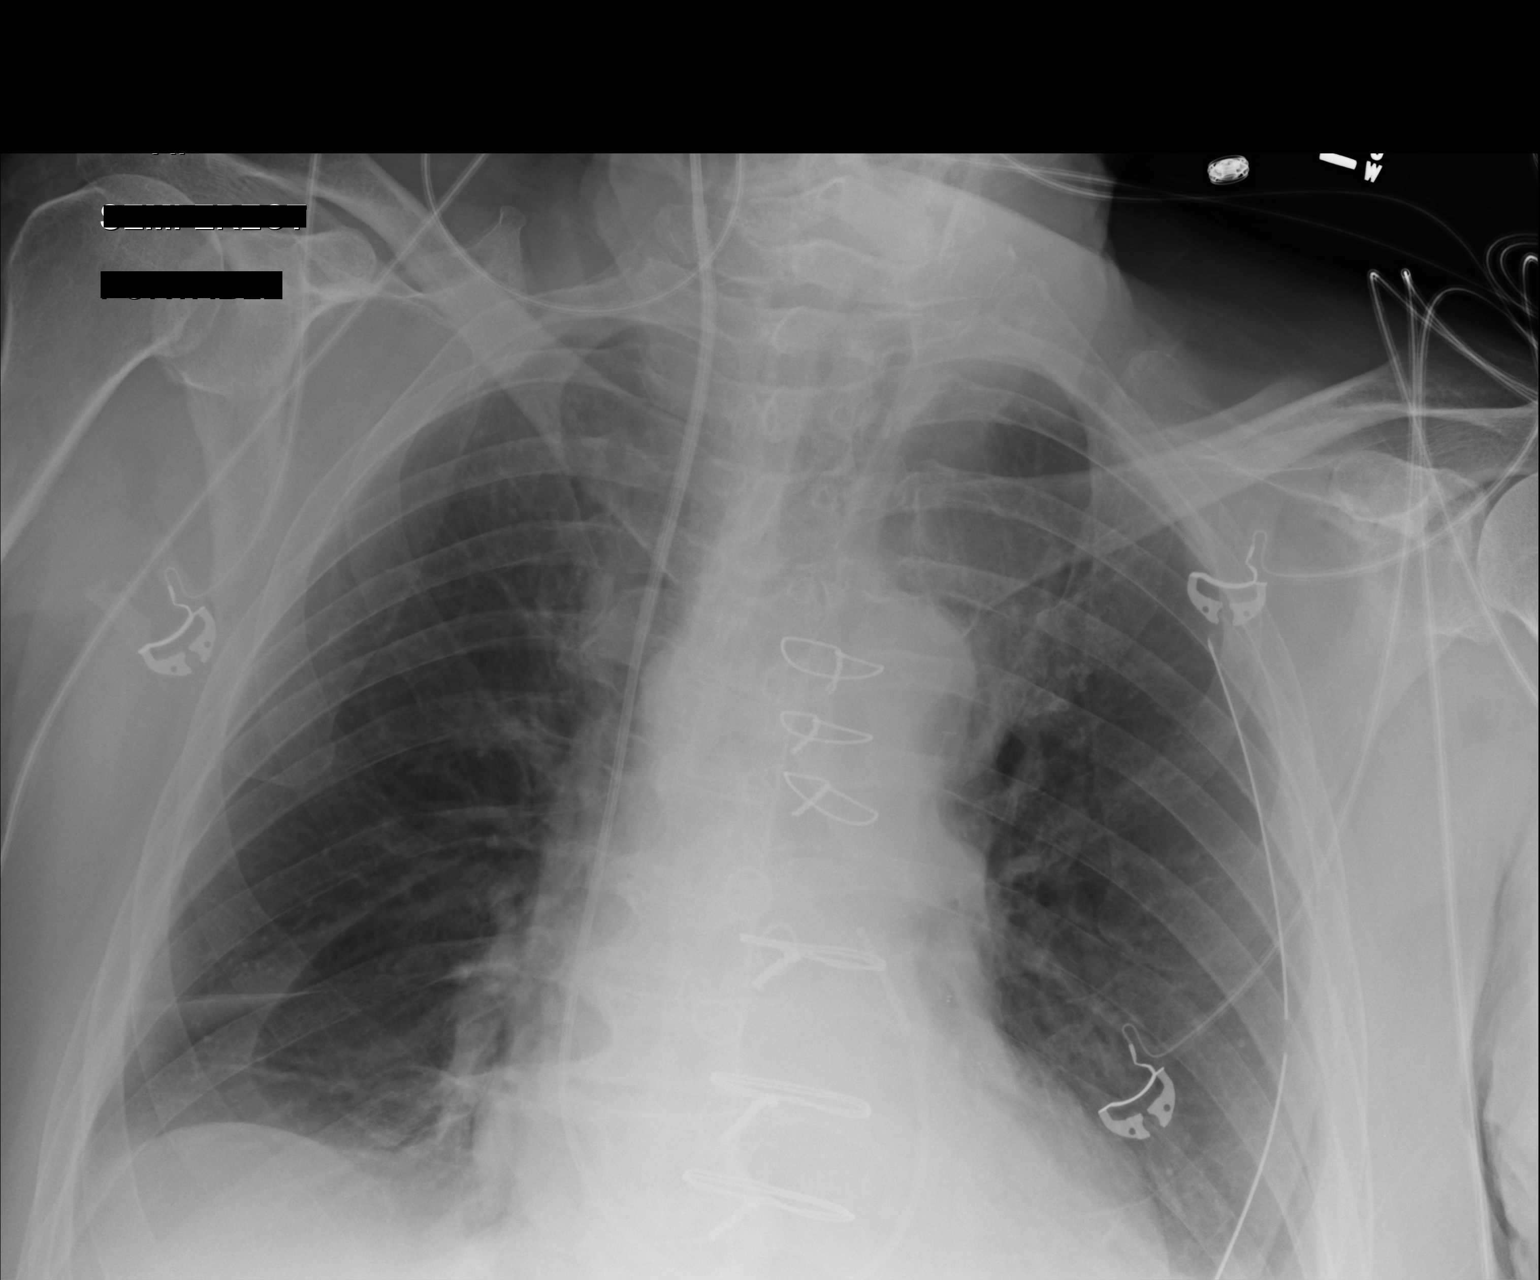

[AP (2 of 2)]
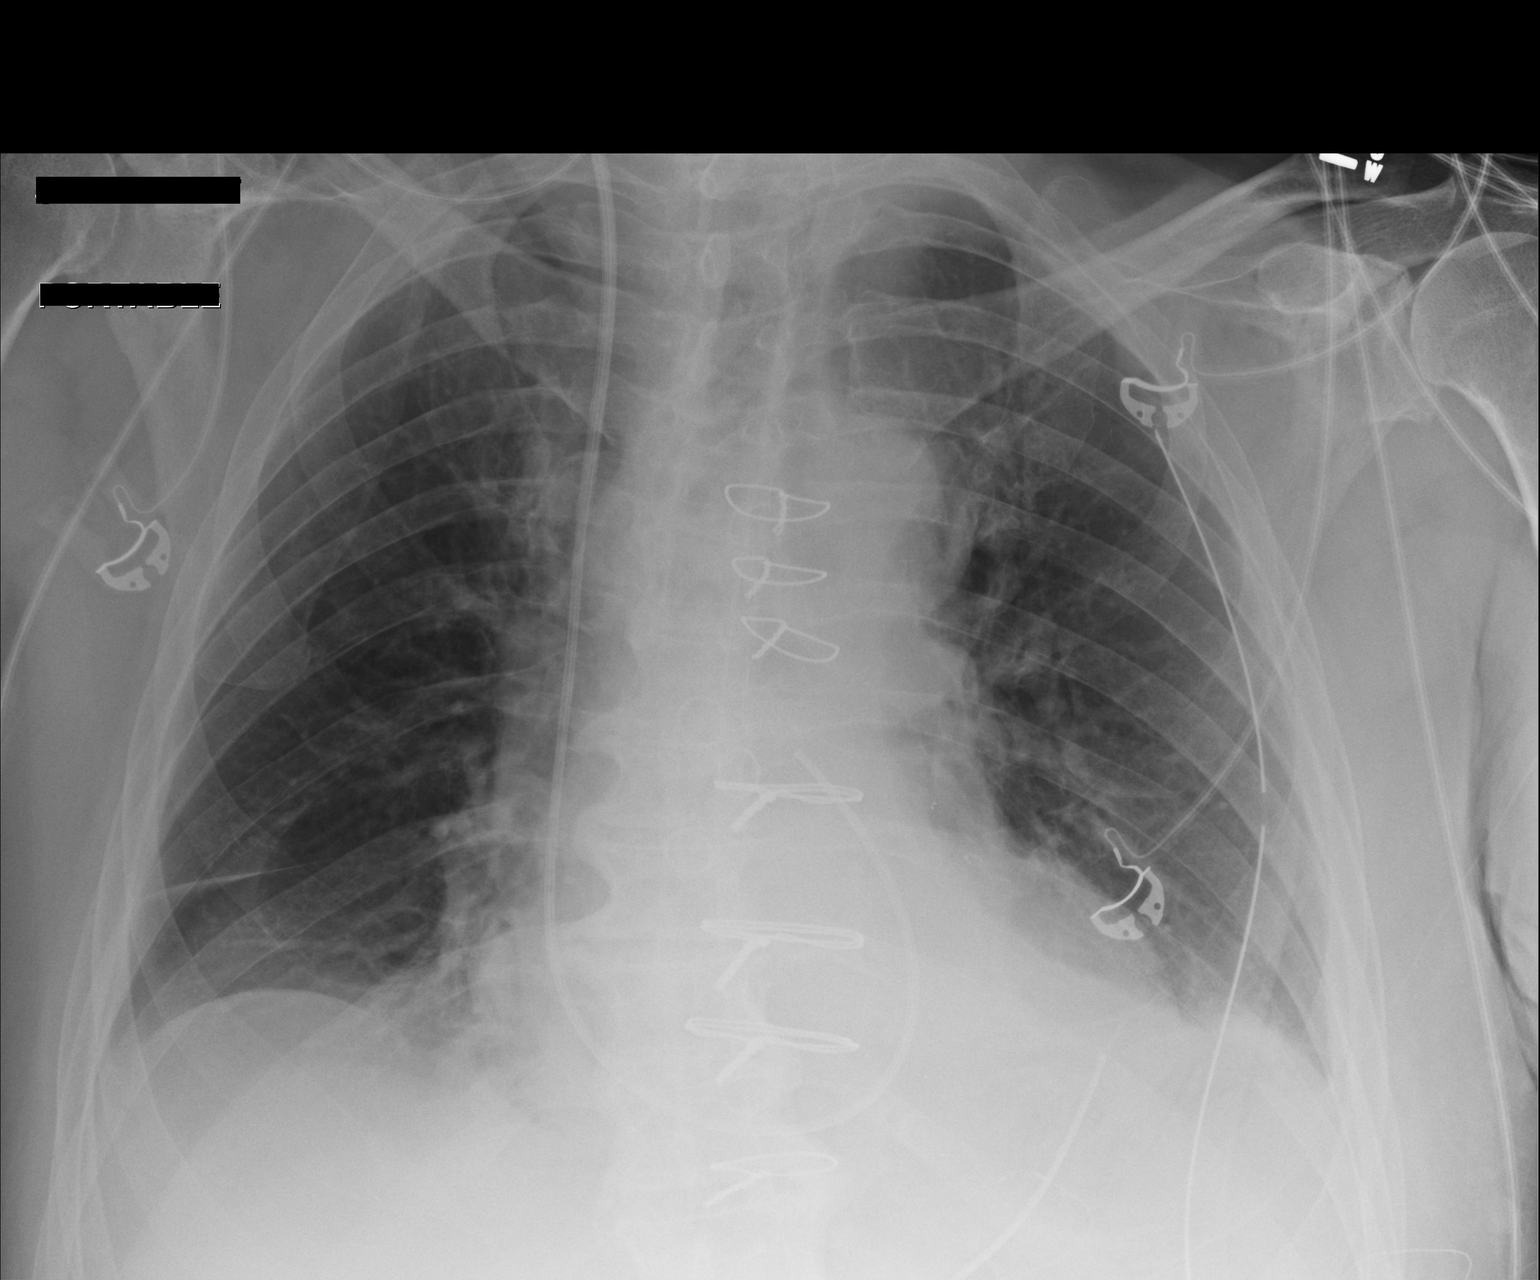

[2 of 2 positions shown; findings below may reference images not displayed]

FINDINGS: Portable AP semi upright view at 6612 hours. Extubated and enteric
tube removed. Stable right IJ Swan-Ganz catheter. Both left chest
tubes remain in place. Mildly improved lung volumes. Left greater
than right small volume pleural effusions. No pneumothorax or
pulmonary edema. Stable cardiac size and mediastinal contours.
Sequelae of CABG.
IMPRESSION: 1. Extubated and enteric tube removed. Mildly improved lung volumes.
2.  Otherwise, stable lines and tubes.  No pneumothorax.
3. Small left greater than right pleural effusions.

## 2017-08-11 IMAGING — CR DG CHEST 1V PORT
1 series · 1 of 1 positions shown · non-contrast
Comparison: 01/01/2015 at 5090 hours

CLINICAL DATA: PICC line placement

EXAM:
PORTABLE CHEST - 1 VIEW

[AP]
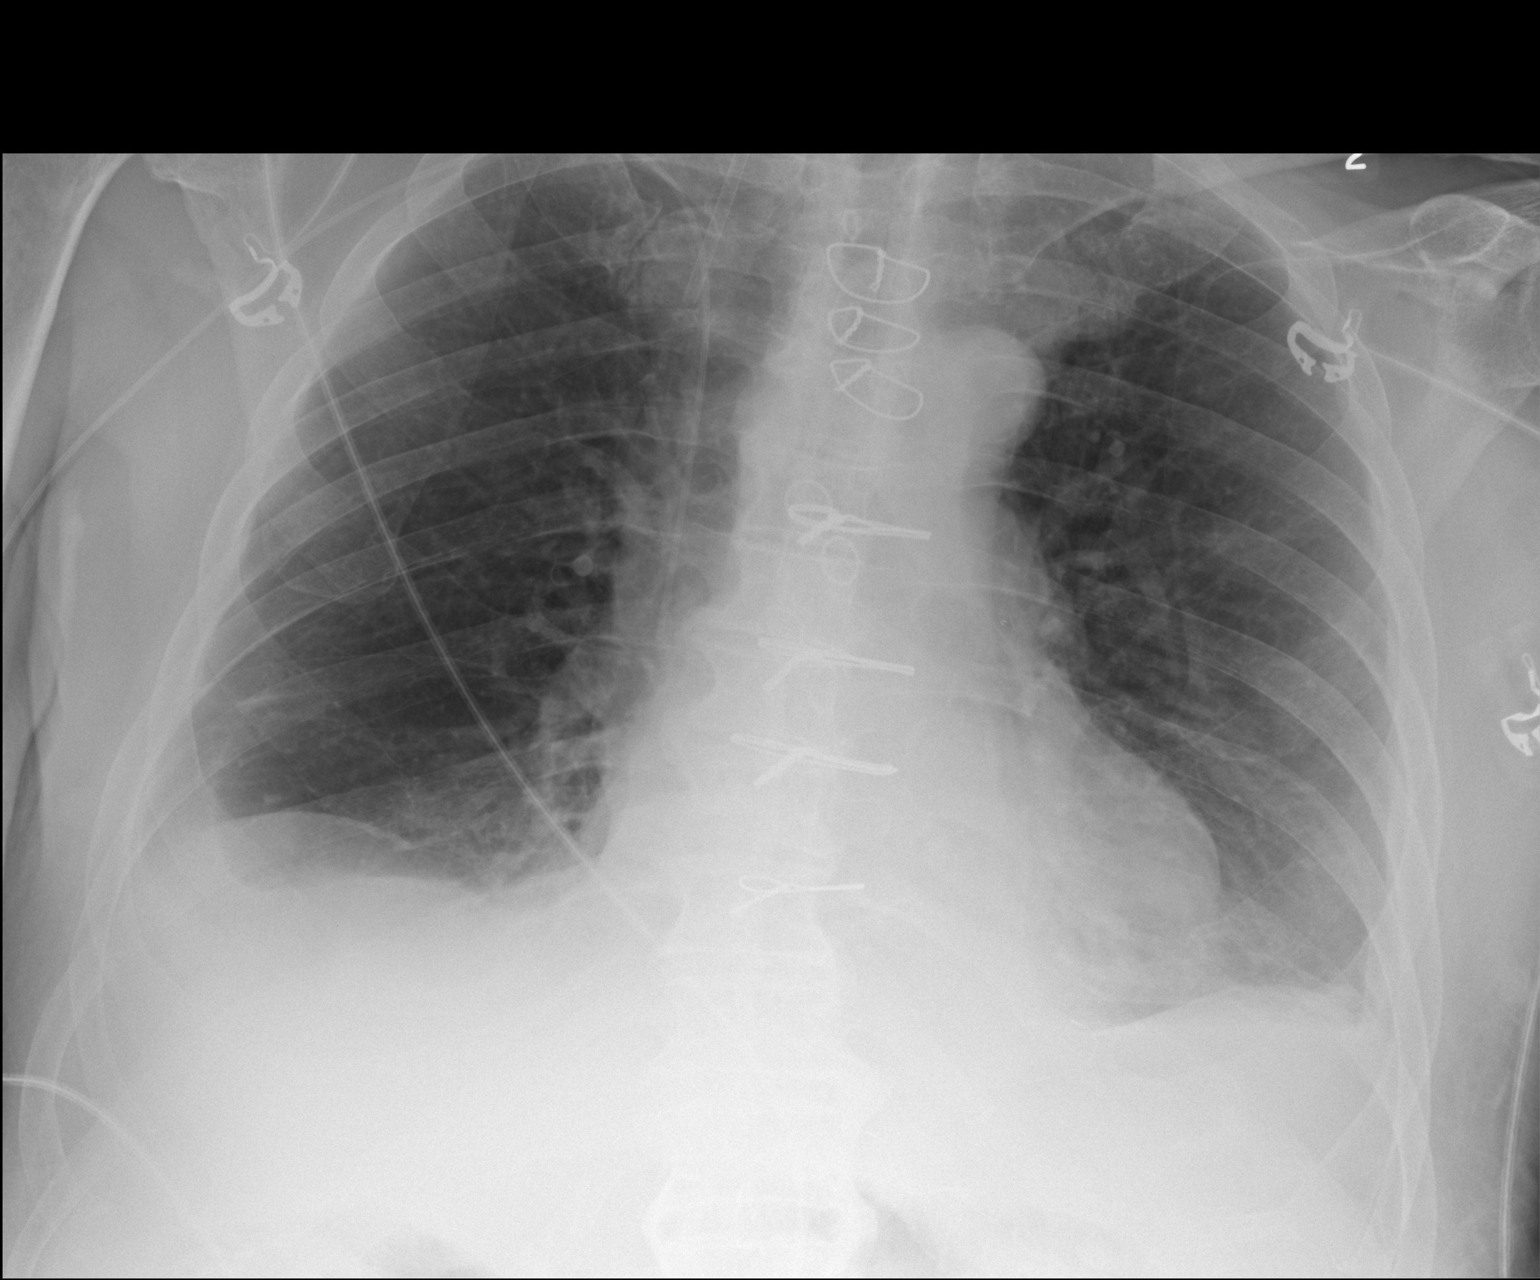

[1 of 1 positions shown; findings below may reference images not displayed]

FINDINGS: Right arm PICC terminates at the cavoatrial junction.

Small bilateral pleural effusions. No frank interstitial edema. No
pneumothorax.

Right IJ venous sheath terminates in the upper SVC.

Heart is top-normal in size. Postsurgical changes related to prior
CABG.
IMPRESSION: Right arm PICC terminates at the cavoatrial junction.

Small bilateral pleural effusions.  No pneumothorax.

## 2017-08-12 IMAGING — CR DG CHEST 1V PORT
1 series · 1 of 1 positions shown · non-contrast
Comparison: January 01, 2015.

CLINICAL DATA: Shortness of breath status post coronary artery
bypass graft x6.

EXAM:
PORTABLE CHEST - 1 VIEW

[AP]
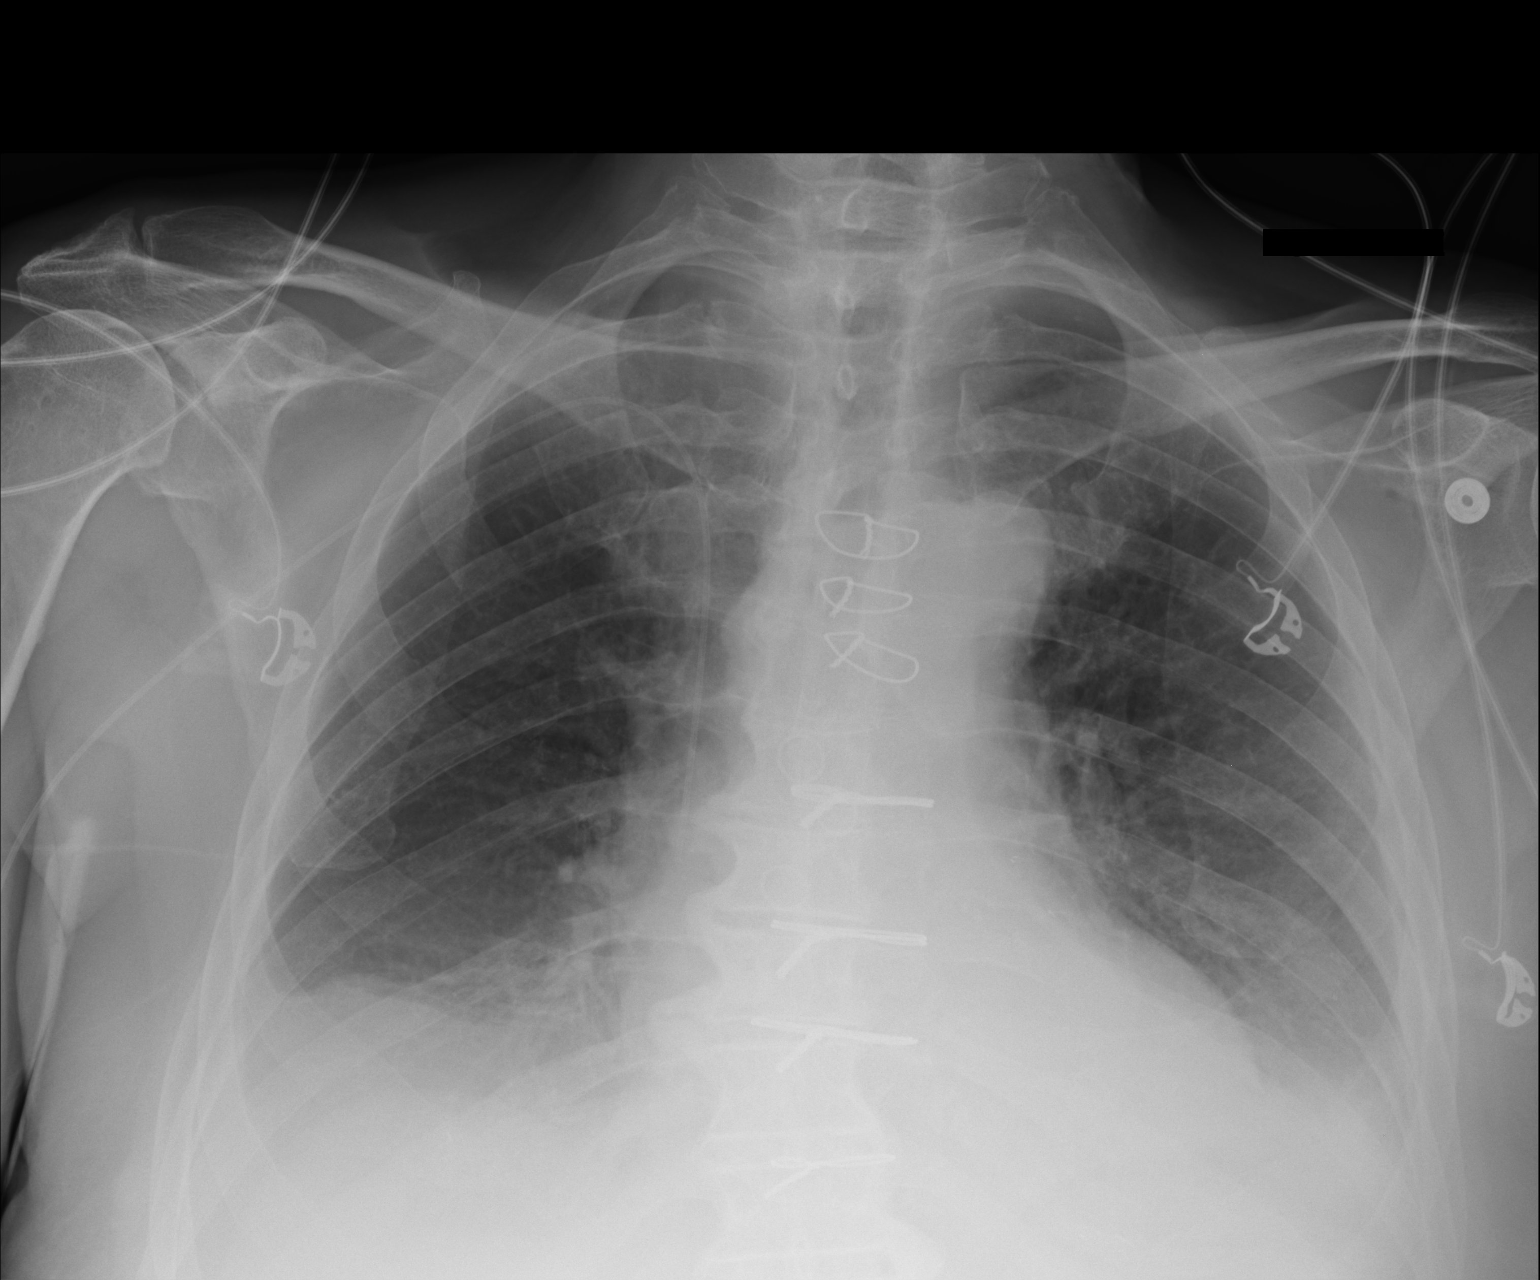

[1 of 1 positions shown; findings below may reference images not displayed]

FINDINGS: Stable cardiomediastinal silhouette. Status post coronary artery
bypass graft. No pneumothorax is noted. Right-sided PICC line is
again noted with distal tip in the expected position of cavoatrial
junction. Mild bibasilar opacities are noted most consistent with
subsegmental atelectasis with associated pleural effusions. Bony
thorax is intact.
IMPRESSION: Stable mild bibasilar opacities consistent with subsegmental
atelectasis with associated pleural effusions.

## 2017-08-20 NOTE — Progress Notes (Signed)
Removed pt IVs, rectal pouch and foley cath. Family is at bedside awaiting funeral home. Family does not have any needs at this time.

## 2017-08-20 NOTE — Progress Notes (Signed)
RN clarified with Dr Mortimer Fries if should continue with scheduled meds, MD states yes, until family tells to start comfort care measures.  RN went to room to give scheduled meds, family declined all scheduled meds.  Will let know when to start morphine gtt.

## 2017-08-20 NOTE — Progress Notes (Signed)
   08/21/17 1115  Clinical Encounter Type  Visited With Patient and family together  Visit Type Initial  Spiritual Encounters  Spiritual Needs Prayer;Emotional   Offered emotional support to family at patient's bedside.  Conversation around patient's ways of being in the world, of prayers important to family at this time.  Chaplain led prayer with patient family.  Will continue to monitor patient/family needs.  Encouraged family to have staff page chaplain as needed.

## 2017-08-20 NOTE — Progress Notes (Signed)
Daily Progress Note   Patient Name: Jacob Reyes       Date: 08-11-17 DOB: September 13, 1925  Age: 84 y.o. MRN#: [id number] Attending Physician: Gorden Harms, MD Primary Care Physician: Rusty Aus, MD Admit Date: 08/11/2017  Reason for Consultation/Follow-up: Establishing goals of care  Subjective: Patient on high flow nasal cannula. Family at bedside. Patient has been placed on comfort care by primary team. Plans to move patient to 1C prior to removing high flow cannula. Support offered to family.   Morphine drip and PRN order in place for air hunger, Ativan in place for anxiety, all in place per primary team. Patient appears comfortable.   Length of Stay: 5  Current Medications: Scheduled Meds:    Continuous Infusions: . morphine 7 mg/hr (11-Aug-2017 1532)    PRN Meds: LORazepam, morphine injection, [DISCONTINUED] ondansetron **OR** ondansetron (ZOFRAN) IV  Physical Exam  Constitutional: No distress.  Pulmonary/Chest:  High flow cannula  Neurological:  Eyes closed.             Vital Signs: BP (!) 105/93   Pulse (!) 109   Temp 98.5 F (36.9 C) (Oral)   Resp (!) 21   Ht 6' (1.829 m)   Wt 79.8 kg (175 lb 14.8 oz)   SpO2 92%   BMI 23.86 kg/m  SpO2: SpO2: 92 % O2 Device: O2 Device: High Flow Nasal Cannula O2 Flow Rate: O2 Flow Rate (L/min): 50 L/min  Intake/output summary:   Intake/Output Summary (Last 24 hours) at 2017-08-11 1552 Last data filed at Aug 11, 2017 0630 Gross per 24 hour  Intake 100 ml  Output 670 ml  Net -570 ml   LBM: Last BM Date: 07/24/17 Baseline Weight: Weight: 79.8 kg (176 lb) Most recent weight: Weight: 79.8 kg (175 lb 14.8 oz)       Palliative Assessment/Data: 10%    Flowsheet Rows     Most Recent Value  Intake Tab  Referral  Department  Hospitalist  Unit at Time of Referral  ICU  Palliative Care Primary Diagnosis  Cardiac  Date Notified  07/23/17  Palliative Care Type  New Palliative care  Reason for referral  Clarify Goals of Care  Date of Admission  07/25/2017  Date first seen by Palliative Care  07/25/17  # of days Palliative referral response time  2  Day(s)  # of days IP prior to Palliative referral  2  Clinical Assessment  Psychosocial & Spiritual Assessment  Palliative Care Outcomes      Patient Active Problem List   Diagnosis Date Noted  . Acute respiratory failure with hypoxia and hypercarbia ([place]) 08/05/2017  . Dry eye syndrome 03/07/2017  . Paroxysmal atrial fibrillation ([place]) 01/01/2017  . Hypo-osmolality and hyponatremia 01/01/2017  . Retention of urine, unspecified 01/01/2017  . Hypothyroidism 01/01/2017  . Unspecified glaucoma 01/01/2017  . Personal history of malignant neoplasm of prostate 01/01/2017  . Hypotension, unspecified 01/01/2017  . Muscle weakness (generalized) 01/01/2017  . Major depressive disorder, single episode, unspecified 01/01/2017  . Anxiety disorder 01/01/2017  . Dyspnea 01/01/2017  . Actinic keratitis 11/08/2016  . Poor appetite 08/21/2016  . Parkinson disease ([place]) 08/21/2016  . Dyslipidemia 01/02/2015  . Gastroesophageal reflux disease without esophagitis 01/02/2015  . S/P CABG x 6 12/28/2014  . Atherosclerotic heart disease of native coronary artery with angina pectoris ([place]) 12/10/2014  . Cancer of colon- remote surgery 11/16/2013    Palliative Care Assessment & Plan   Patient Profile:  Jacob a89 y.o.malewith a known history of atrial fibrillation, coronary artery disease, gastroesophageal reflux disease, Parkinson-like syndrome. Admitted for PNA and abd distention.   Assessment/Recommendations/Plan:  In to offer support to family. Comfort care orders placed by primary team. Plan for patient to move to 1 C.     Code Status:      Code Status Orders  (From admission, onward)        Start     Ordered   2017/08/25 1432  DNR (Do not attempt resuscitation)  Continuous    Question Answer Comment  In the event of cardiac or respiratory ARREST Do not call a "code blue"   In the event of cardiac or respiratory ARREST Do not perform Intubation, CPR, defibrillation or ACLS   In the event of cardiac or respiratory ARREST Use medication by any route, position, wound care, and other measures to relive pain and suffering. May use oxygen, suction and manual treatment of airway obstruction as needed for comfort.   Comments nurse may pronounce      2017-08-25 1432    Code Status History    Date Active Date Inactive Code Status Order ID Comments User Context   07/25/2017 21:19 2017-08-25 14:32 DNR [id number]  Loletha Grayer, MD ED   12/28/2014 16:47 01/04/2015 18:45 Full Code [id number]  Marcene Corning Inpatient   12/10/2014 08:27 12/10/2014 12:47 Full Code [id number]  Teodoro Spray, MD Inpatient    Advance Directive Documentation     Most Recent Value  Type of Advance Directive  Out of facility DNR (pink MOST or yellow form)  Pre-existing out of facility DNR order (yellow form or pink MOST form)  Yellow form placed in chart (order not valid for inpatient use)  "MOST" Form in Place?  No data       Prognosis:   Hours - Days  Discharge Planning:  Anticipated Hospital Death  Care plan was discussed with RN  Thank you for allowing the Palliative Medicine Team to assist in the care of this patient.   Total Time 25 min Prolonged Time Billed  No      Greater than 50%  of this time was spent counseling and coordinating care related to the above assessment and plan.  Asencion Gowda, NP  Please contact Palliative Medicine Team phone at [phone] for questions and concerns.

## 2017-08-20 NOTE — Progress Notes (Signed)
Pt left via funeral home at 2030.

## 2017-08-20 NOTE — Progress Notes (Addendum)
MEDICATION RELATED CONSULT NOTE - INITIAL   Pharmacy Consult for Electrolyte Monitoring   Allergies  Allergen Reactions  . Carvedilol Other (See Comments)  . Duloxetine Other (See Comments)    Passing out  . Paroxetine Hcl Other (See Comments)    Hypotension  . Zaleplon Other (See Comments)    Labs: Recent Labs    07/24/17 0444 07/25/17 0628 08-24-17 0633  WBC 9.0  --   --   HGB 14.0  --   --   HCT 41.7  --   --   PLT 160  --   --   CREATININE  --  0.97 0.89  MG  --  2.3 2.5*  PHOS  --  2.2* 2.2*   Medical History: Past Medical History:  Diagnosis Date  . Abnormal stress test   . Acquired hypothyroidism    unspecified  . AF (paroxysmal atrial fibrillation) ([place])   . Anxiety    just started med. for anxiety   . Cancer [facility])    prostate  . Colon cancer ([place]) 11/16/2013   partial colectomy  . Coronary artery disease   . Depression    unspecified  . GERD (gastroesophageal reflux disease)   . Glaucoma   . Hearing loss   . History of hiatal hernia   . Hyperlipidemia 11/16/2013  . Hypertension   . Pernicious anemia   . Prostate cancer ([place]) 11/16/2013   1/8 bx pos, waiting  . Reflux   . Reflux esophagitis 11/16/2013    Assessment: 3/7 K: 3.0, Mg: 2.3, Ph: 2.2  Goal of Therapy:  Electrolytes WNL  Plan:  Will give KCl 74mEq IV x4 doses and KPhos 81mmol. --Nurse charted not given: patient/family refused. Will follow up with electrolytes tomorrow AM.   Candelaria Stagers, PharmD Pharmacy Resident  24-Aug-2017,12:05 PM

## 2017-08-20 NOTE — Progress Notes (Signed)
Report called to receiving RN, Deirda with no further questions.  Family updated.

## 2017-08-20 NOTE — Progress Notes (Signed)
Per Dr Mortimer Fries during rounds, patient will transition to comfort care, MD will order morphine gtt, Family will let us know when they are ready to start morphine gtt and take patient off O2.

## 2017-08-20 NOTE — Progress Notes (Signed)
I have discussed clinical status with family, patient is struggling to breathe and suffering. Patient is in dying process.  Family understands his grave prognosis and they have agreed and consented to Comfort care measures when the whole family arrives.    Family are satisfied with Plan of action and management. All questions answered  Corrin Parker, M.D.  [facility]  Medical Director [place] Director [facility] Cardio-Pulmonary Department

## 2017-08-20 NOTE — Consult Note (Signed)
[place] [facility]  PATIENT NAME: Jacob Reyes   MR#:  [id number] DATE OF BIRTH:  1925/11/19 DATE OF ADMISSION:  08/01/2017 PRIMARY CARE PHYSICIAN: Dr Frazier Richards  REQUESTING/REFERRING PHYSICIAN: Dr Eliott Nine   CHIEF COMPLAINT:   Chief Complaint  Patient presents with  . Aspiration  Severe resp distress  HISTORY OF PRESENT ILLNESS:   the patient is a DNR/DNI   on 100% high flow [place] +BM's Less distended today Still with resp failure Severe resp failure Family at bedside Very weak and  Lethargic using accessory muscles to breathe   Review of Systems: UNABLE TO PROVIDE DUE TO RESP DISTRESS Other:  All other systems negative   VITAL SIGNS:  Blood pressure 117/85, pulse 95, temperature 98.7 F (37.1 C), temperature source Oral, resp. rate 20, height 6' (1.829 m), weight 175 lb 14.8 oz (79.8 kg), SpO2 96 %.  PHYSICAL EXAMINATION:  GENERAL:  84 y.o.-year-old patient lying in the bed with acute respiratory distress. On high flow [place] EYES: Pupils equal, round, reactive to light and accommodation. No scleral icterus. HEENT: Head atraumatic, normocephalic. Oropharynx and nasopharynx clear.  NECK:  Supple, no jugular venous distention. No thyroid enlargement, no tenderness.  LUNGS: Decreased breath sounds bilaterally, upper airway wheezing, rhonchi bilateral base.+ use of accessory muscles of respiration.  CARDIOVASCULAR: S1, S2 irregularly irregular tachycardic. No murmurs, rubs, or gallops.  ABDOMEN:less distended. Decreased Bowel sounds absent. No organomegaly or mass.  EXTREMITIES: 2+ edema, no cyanosis, or clubbing.  NEUROLOGIC: Patient able to move his arms on his own.  Lethargic PSYCHIATRIC: The patient is lethargic.  SKIN:  Chronic lower extremity discoloration.  Bruising lower extremities and upper extremities  LABORATORY PANEL:   CBC Recent Labs  Lab 07/24/17 0444  WBC 9.0  HGB 14.0  HCT 41.7  PLT 160    ------------------------------------------------------------------------------------------------------------------  Chemistries  Recent Labs  Lab 08/15/2017 1857  07/25/17 0628 08-10-2017 0633  NA 142   < > 150* 152*  K 4.2   < > 3.0* 3.0*  CL 106   < > 113* 114*  CO2 24   < > 26 27  GLUCOSE 192*   < > 97 114*  BUN 25*   < > 43* 45*  CREATININE 0.92   < > 0.97 0.89  CALCIUM 9.1   < > 8.6* 8.5*  MG  --   --  2.3  --   AST 56*  --   --   --   ALT 29  --   --   --   ALKPHOS 147*  --   --   --   BILITOT 2.2*  --   --   --    < > = values in this interval not displayed.   ------------------------------------------------------------------------------------------------------------------  Cardiac Enzymes Recent Labs  Lab 07/25/2017 1857  TROPONINI 0.03*     IMPRESSION AND PLAN:  84 yo white male with multiple medical issues with severe and acute resp failure with acute fecal impaction and abd distention with left sided opacity with  pneumonia with effusion and pulm edema    1.Respiratory Failure -patient is DNR/DNI Oxygen as needed High flow [place] as tolerated NO MORE BIPAP AT THIS TIME  2.Pneumonia-aspiration/HCAP Continue IV abx NEB therapy High flwo [place] 100% fio2  3. Acute hypoxic and hypercarbic respiratory failure  And acidosis.   Patient currently on 100%   Patient is a DO NOT RESUSCITATE/DNI  4.Abdominal distention, nausea and constipation-+fecal impaction S/p enema Place rectal tube for decompression  5.left sided pleural effusion Chronic No need for chest tube  6.Lactic acidosis likely from sepsis -IVF's follow up [place]   CODE STATUS: DNR/DNI   Critical Care Time devoted to patient care services described in this note is 31 minutes.   Overall, patient is critically ill, prognosis is guarded.  Patient with Multiorgan failure and at high risk for cardiac arrest and death.   Family meeting today-recommend proceeding with comfort care measures  Bryleigh [place]taway Patricia Pesa, M.D.  [facility]  Medical Director [place] Director [facility] Cardio-Pulmonary Department

## 2017-08-20 NOTE — Progress Notes (Addendum)
Patient pronounced deceased at 59. AC notified. Dr Jerelyn Charles notified.  60 ml morphine wasted. Jacky Kindle witnessed.

## 2017-08-20 NOTE — Progress Notes (Signed)
   Aug 08, 2017 1505  Clinical Encounter Type  Visited With Family  Visit Type Follow-up  Spiritual Encounters  Spiritual Needs Emotional   Chaplain went to check in on patient and family.  Spoke with person standing at the door regarding move to comfort care and family perceptions.  Family maintaining bedside presence.  Reminded them that chaplain support is available 24/7 if needed.

## 2017-08-20 NOTE — Discharge Summary (Signed)
[facility] [place] NAME: Jacob Reyes    MR#:  [id number]  DATE OF BIRTH:  [birth year]  DATE OF ADMISSION:  08/02/2017 ADMITTING PHYSICIAN: Loletha Grayer, MD  DATE OF DISCHARGE: 07-30-2017  8:30 PM  PRIMARY CARE PHYSICIAN: Rusty Aus, MD    ADMISSION DIAGNOSIS:  Acute respiratory failure with hypoxia and hypercapnia (HCC) [J96.01, J96.02] Aspiration pneumonia of left lower lobe, unspecified aspiration pneumonia type ([place]) [J69.0]  DISCHARGE DIAGNOSIS:  Active Problems:   Acute respiratory failure with hypoxia and hypercarbia ([place])   SECONDARY DIAGNOSIS:   Past Medical History:  Diagnosis Date  . Abnormal stress test   . Acquired hypothyroidism    unspecified  . AF (paroxysmal atrial fibrillation) ([place])   . Anxiety    just started med. for anxiety   . Cancer [facility])    prostate  . Colon cancer ([place]) 11-20-2013   partial colectomy  . Coronary artery disease   . Depression    unspecified  . GERD (gastroesophageal reflux disease)   . Glaucoma   . Hearing loss   . History of hiatal hernia   . Hyperlipidemia 11/20/2013  . Hypertension   . Pernicious anemia   . Prostate cancer ([place]) 11/20/13   1/8 bx pos, waiting  . Reflux   . Reflux esophagitis 11-20-13    HOSPITAL COURSE:  Death note At 1719 patient was found without spontaneous respiration, pulse, cranial nerve reflexes, patient was pronounced dead by nursing staff on, family notified, for more specific details please see chart  DISCHARGE CONDITIONS:   Dead CONSULTS OBTAINED:  Treatment Team:  Phong Isenberg, Avel Peace, MD  DRUG ALLERGIES:   Allergies  Allergen Reactions  . Carvedilol Other (See Comments)  . Duloxetine Other (See Comments)    Passing out  . Paroxetine Hcl Other (See Comments)    Hypotension  . Zaleplon Other (See Comments)    DISCHARGE MEDICATIONS:   Allergies as of 2017/07/30      Reactions   Carvedilol Other (See Comments)   Duloxetine Other (See Comments)   Passing out   Paroxetine Hcl Other (See Comments)   Hypotension   Zaleplon Other (See Comments)      Medication List    ASK your doctor about these medications   acetaminophen 500 MG tablet Commonly known as:  TYLENOL Take 500 mg by mouth every 6 (six) hours as needed.   alum & mag hydroxide-simeth [id number] MG/5ML suspension Commonly known as:  MAALOX PLUS Take 30 mLs by mouth every 4 (four) hours as needed for indigestion.   benzonatate 200 MG capsule Commonly known as:  TESSALON Take 200 mg by mouth every 8 (eight) hours as needed for cough.   bisacodyl 10 MG suppository Commonly known as:  DULCOLAX Place 10 mg rectally daily as needed.   BOUDREAUXS BUTT PASTE 16 % Oint Generic drug:  Zinc Oxide Apply liberal amount topically to area of skin irritation as needed. Ok to leave at bedside.   brimonidine 0.2 % ophthalmic solution Commonly known as:  ALPHAGAN Place 1 drop into both eyes 2 (two) times daily.   ENSURE Take 1 Can by mouth 2 (two) times daily between meals. ONLY GIVE IF HE IS NOT EATING. Family will provide the ensure.   GERI-TUSSIN DM 10-100 MG/5ML liquid Generic drug:  Dextromethorphan-guaiFENesin Take 5-10 mLs by mouth every 4 (four) hours as needed.   gluconic acid-citric acid irrigation Irrigate with 30 mLs as directed 2 (two) times daily.  Clamp tubing for 30 minutes then unclamp and allow to flow   hydrocortisone cream 1 % Apply 1 application topically 4 (four) times daily as needed for itching. Apply to clean, dry hemorrhoids. Ok to change order to prn when resolved or pt request.   hydrocortisone cream 1 % Apply 1 application topically every Monday, Wednesday, and Friday. Apply to right ear   ipratropium-albuterol 0.5-2.5 (3) MG/3ML Soln Commonly known as:  DUONEB Take 3 mLs by nebulization 4 (four) times daily as needed.   latanoprost 0.005 % ophthalmic solution Commonly known as:  XALATAN Place 1 drop into  both eyes at bedtime. Outside pharmacy wife will provide them   levothyroxine 75 MCG tablet Commonly known as:  SYNTHROID, LEVOTHROID Take 75 mcg by mouth daily before breakfast.   LUMIGAN 0.01 % Soln Generic drug:  bimatoprost Place 1 drop into both eyes at bedtime. Wife will supply from outside pharmacy. Let wife know supply is low.   magnesium hydroxide 400 MG/5ML suspension Commonly known as:  MILK OF MAGNESIA Take 30 mLs by mouth every 4 (four) hours as needed for mild constipation. If constipation / no BM for 2 days   metoCLOPramide 5 MG/5ML solution Commonly known as:  REGLAN Take 5 mg by mouth every 6 (six) hours.   midodrine 5 MG tablet Commonly known as:  PROAMATINE Take 5 mg by mouth 3 (three) times daily with meals.   mirtazapine 15 MG tablet Commonly known as:  REMERON Take 15 mg by mouth at bedtime.   morphine 20 MG/ML concentrated solution Commonly known as:  ROXANOL Give 0.25 ml -0.5 ml by mouth every hour as needed for mild to severe pain. 0.25 = mild pain 0.5 = severe pain   nystatin powder Commonly known as:  MYCOSTATIN/NYSTOP Apply thin film topically to groin rash 2 times daily as needed until healed   omeprazole 20 MG capsule Commonly known as:  PRILOSEC Take 20 mg by mouth daily before breakfast.   OXYGEN Inhale 2-5 L into the lungs continuous. Titrate to maintain sats > 90 % and/or for comfort   polyethylene glycol powder powder Commonly known as:  GLYCOLAX/MIRALAX Take 17 g by mouth daily as needed.   SENNA-PLUS 8.6-50 MG tablet Generic drug:  senna-docusate Take 1-2 tablets by mouth 2 (two) times daily as needed.   sertraline 100 MG tablet Commonly known as:  ZOLOFT Take 100 mg by mouth daily.   sodium chloride 0.9 % infusion Inject 50 mLs into the vein continuous. Give 250 ML bolus over 1 hour, then continuous at 50 ML/ hour x 72 hours for ileus. Ask about: Should I take this medication?   sorbitol 70 % solution Take 30 mLs by mouth  daily as needed. Give every 2 hours until large BM, then change med to as needed   timolol 0.5 % ophthalmic solution Commonly known as:  BETIMOL Place 1 drop into both eyes 2 (two) times daily.   WHITE PETROLATUM-MINERAL OIL OP Place 1/4 ribbon in both eyes at bedtime at least 30 minutes after placing drops for glaucoma (for dry eyes)        DISCHARGE INSTRUCTIONS:    If you experience worsening of your admission symptoms, develop shortness of breath, life threatening emergency, suicidal or homicidal thoughts you must seek medical attention immediately by calling 911 or calling your MD immediately  if symptoms less severe.  You Must read complete instructions/literature along with all the possible adverse reactions/side effects for all the Medicines you take  and that have been prescribed to you. Take any new Medicines after you have completely understood and accept all the possible adverse reactions/side effects.   Please note  You were cared for by a hospitalist during your hospital stay. If you have any questions about your discharge medications or the care you received while you were in the hospital after you are discharged, you can call the unit and asked to speak with the hospitalist on call if the hospitalist that took care of you is not available. Once you are discharged, your primary care physician will handle any further medical issues. Please note that NO REFILLS for any discharge medications will be authorized once you are discharged, as it is imperative that you return to your primary care physician (or establish a relationship with a primary care physician if you do not have one) for your aftercare needs so that they can reassess your need for medications and monitor your lab values.    Today   CHIEF COMPLAINT:   Chief Complaint  Patient presents with  . Aspiration    HISTORY OF PRESENT ILLNESS:  84 y.o. male with a known history of atrial fibrillation, coronary  artery disease, gastroesophageal reflux disease, Parkinson-like syndrome.  He has been having a distended abdomen last few days.  They cannot recall when his last bowel movement was.  He has had nausea but no vomiting.  The patient was brought in and placed on BiPAP for acute respiratory failure.  There is a little air leak on the BiPAP.  The patient was acidotic in the ER.  Family at the bedside. The patient is on 100% on the BiPAP.  The patient is a DO NOT RESUSCITATE. Hospitalist services were contacted for further evaluation.  On my exam his abdomen was distended and hard.  I was unable to auscultate bowel sounds.  Family states that hospice had signed off on him recently.   VITAL SIGNS:  Blood pressure (!) 105/93, pulse (!) 109, temperature 98.5 F (36.9 C), temperature source Oral, resp. rate (!) 21, height 6' (1.829 m), weight 79.8 kg (175 lb 14.8 oz), SpO2 92 %.  I/O:    Intake/Output Summary (Last 24 hours) at 07/27/2017 0723 Last data filed at 2017/08/09 2009 Gross per 24 hour  Intake -  Output 800 ml  Net -800 ml    PHYSICAL EXAMINATION:  GENERAL:  84 y.o.-year-old patient lying in the bed with no acute distress.  EYES: Pupils equal, round, reactive to light and accommodation. No scleral icterus. Extraocular muscles intact.  HEENT: Head atraumatic, normocephalic. Oropharynx and nasopharynx clear.  NECK:  Supple, no jugular venous distention. No thyroid enlargement, no tenderness.  LUNGS: Normal breath sounds bilaterally, no wheezing, rales,rhonchi or crepitation. No use of accessory muscles of respiration.  CARDIOVASCULAR: S1, S2 normal. No murmurs, rubs, or gallops.  ABDOMEN: Soft, non-tender, non-distended. Bowel sounds present. No organomegaly or mass.  EXTREMITIES: No pedal edema, cyanosis, or clubbing.  NEUROLOGIC: Cranial nerves II through XII are intact. Muscle strength 5/5 in all extremities. Sensation intact. Gait not checked.  PSYCHIATRIC: The patient is alert and  oriented x 3.  SKIN: No obvious rash, lesion, or ulcer.   DATA REVIEW:   CBC Recent Labs  Lab 07/24/17 0444  WBC 9.0  HGB 14.0  HCT 41.7  PLT 160    Chemistries  Recent Labs  Lab 07/30/2017 1857  August 09, 2017 0633  NA 142   < > 152*  K 4.2   < > 3.0*  CL 106   < > 114*  CO2 24   < > 27  GLUCOSE 192*   < > 114*  BUN 25*   < > 45*  CREATININE 0.92   < > 0.89  CALCIUM 9.1   < > 8.5*  MG  --    < > 2.5*  AST 56*  --   --   ALT 29  --   --   ALKPHOS 147*  --   --   BILITOT 2.2*  --   --    < > = values in this interval not displayed.    Cardiac Enzymes Recent Labs  Lab 08/07/2017 1857  TROPONINI 0.03*    Microbiology Results  Results for orders placed or performed during the hospital encounter of 08/09/2017  Blood culture (routine x 2)     Status: None   Collection Time: 07/25/2017  6:46 PM  Result Value Ref Range Status   Specimen Description BLOOD LEFT ANTECUBITAL  Final   Special Requests   Final    BOTTLES DRAWN AEROBIC AND ANAEROBIC Blood Culture adequate volume   Culture   Final    NO GROWTH 5 DAYS Performed at [facility], [address] [place], [place] [address]    Report Status 08-02-17 FINAL  Final  Blood culture (routine x 2)     Status: None   Collection Time: 08/07/2017  6:46 PM  Result Value Ref Range Status   Specimen Description BLOOD BLOOD RIGHT WRIST  Final   Special Requests   Final    BOTTLES DRAWN AEROBIC AND ANAEROBIC Blood Culture adequate volume   Culture   Final    NO GROWTH 5 DAYS Performed at [facility], [address] [place], [place] [address]    Report Status 08-02-17 FINAL  Final  MRSA PCR Screening     Status: None   Collection Time: 08/10/2017  9:52 PM  Result Value Ref Range Status   MRSA by PCR NEGATIVE NEGATIVE Final    Comment:        The GeneXpert MRSA Assay (FDA approved for NASAL specimens only), is one component of a comprehensive MRSA colonization surveillance program. It is not intended to  diagnose MRSA infection nor to guide or monitor treatment for MRSA infections. Performed at [facility], [address] [place], [place] [address]   Body fluid culture     Status: None   Collection Time: 07/23/17 11:15 AM  Result Value Ref Range Status   Specimen Description   Final    PLEURAL Performed at [facility], [address], [place], [place] [address]    Special Requests   Final    NONE Performed at [facility], [address] [place], [place] [address]    Gram Stain   Final    FEW WBC PRESENT, PREDOMINANTLY MONONUCLEAR NO ORGANISMS SEEN    Culture   Final    NO GROWTH 3 DAYS Performed at [facility], [address], [place], [place] [address]    Report Status 08-02-17 FINAL  Final    RADIOLOGY:  No results found.  EKG:   Orders placed or performed during the hospital encounter of 07/20/2017  . ED EKG  . ED EKG      Management plans discussed with the patient, family and they are in agreement.  CODE STATUS:  Code Status History    Date Active Date Inactive Code Status Order ID Comments User Context  2017/07/30 14:32 07/27/2017 01:23 DNR [id number]  Wilhelmina Mcardle, MD Inpatient   08/05/2017 21:19 30-Jul-2017 14:32 DNR [id number]  Loletha Grayer, MD ED   12/28/2014 16:47 01/04/2015 18:45 Full Code [id number]  Marcene Corning Inpatient   12/10/2014 08:27 12/10/2014 12:47 Full Code [id number]  Teodoro Spray, MD Inpatient    Questions for Most Recent Historical Code Status (Order [id number])    Question Answer Comment   In the event of cardiac or respiratory ARREST Do not call a "code blue"    In the event of cardiac or respiratory ARREST Do not perform Intubation, CPR, defibrillation or ACLS    In the event of cardiac or respiratory ARREST Use medication by any route, position, wound care, and other measures to relive pain and suffering. May use oxygen, suction and manual treatment of airway obstruction as needed for  comfort.    Comments nurse may pronounce         Advance Directive Documentation     Most Recent Value  Type of Advance Directive  Out of facility DNR (pink MOST or yellow form)  Pre-existing out of facility DNR order (yellow form or pink MOST form)  Yellow form placed in chart (order not valid for inpatient use)  "MOST" Form in Place?  No data      TOTAL TIME TAKING CARE OF THIS PATIENT: 35 minutes.    Avel Peace Yamel Bale M.D on 07/27/2017 at 7:23 AM  Between 7am to 6pm - Pager - [phone]  After 6pm go to [email or web address] - password [email or web address] [facility]  Office  [phone]  CC: Primary care physician; Rusty Aus, MD   Note: This dictation was prepared with Dragon dictation along with smaller phrase technology. Any transcriptional errors that result from this process are unintentional.

## 2017-08-20 NOTE — Progress Notes (Signed)
[place] [facility] [place] NAME: Jacob Reyes    MR#:  [id number]  DATE OF BIRTH:  1926-04-15  SUBJECTIVE:  CHIEF COMPLAINT:   Chief Complaint  Patient presents with  . Aspiration  Family considering hospice/comfort care  REVIEW OF SYSTEMS:  CONSTITUTIONAL: No fever, fatigue or weakness.  EYES: No blurred or double vision.  EARS, NOSE, AND THROAT: No tinnitus or ear pain.  RESPIRATORY: No cough, shortness of breath, wheezing or hemoptysis.  CARDIOVASCULAR: No chest pain, orthopnea, edema.  GASTROINTESTINAL: No nausea, vomiting, diarrhea or abdominal pain.  GENITOURINARY: No dysuria, hematuria.  ENDOCRINE: No polyuria, nocturia,  HEMATOLOGY: No anemia, easy bruising or bleeding SKIN: No rash or lesion. MUSCULOSKELETAL: No joint pain or arthritis.   NEUROLOGIC: No tingling, numbness, weakness.  PSYCHIATRY: No anxiety or depression.   ROS  DRUG ALLERGIES:   Allergies  Allergen Reactions  . Carvedilol Other (See Comments)  . Duloxetine Other (See Comments)    Passing out  . Paroxetine Hcl Other (See Comments)    Hypotension  . Zaleplon Other (See Comments)    VITALS:  Blood pressure 114/90, pulse (!) 105, temperature 98.7 F (37.1 C), temperature source Oral, resp. rate (!) 21, height 6' (1.829 m), weight 79.8 kg (175 lb 14.8 oz), SpO2 93 %.  PHYSICAL EXAMINATION:  GENERAL:  84 y.o.-year-old patient lying in the bed with no acute distress.  EYES: Pupils equal, round, reactive to light and accommodation. No scleral icterus. Extraocular muscles intact.  HEENT: Head atraumatic, normocephalic. Oropharynx and nasopharynx clear.  NECK:  Supple, no jugular venous distention. No thyroid enlargement, no tenderness.  LUNGS: Normal breath sounds bilaterally, no wheezing, rales,rhonchi or crepitation. No use of accessory muscles of respiration.  CARDIOVASCULAR: S1, S2 normal. No murmurs, rubs, or gallops.  ABDOMEN: Soft, nontender,  nondistended. Bowel sounds present. No organomegaly or mass.  EXTREMITIES: No pedal edema, cyanosis, or clubbing.  NEUROLOGIC: Cranial nerves II through XII are intact. Muscle strength 5/5 in all extremities. Sensation intact. Gait not checked.  PSYCHIATRIC: The patient is alert and oriented x 3.  SKIN: No obvious rash, lesion, or ulcer.   Physical Exam LABORATORY PANEL:   CBC Recent Labs  Lab 07/24/17 0444  WBC 9.0  HGB 14.0  HCT 41.7  PLT 160   ------------------------------------------------------------------------------------------------------------------  Chemistries  Recent Labs  Lab 07/29/2017 1857  08-25-17 0633  NA 142   < > 152*  K 4.2   < > 3.0*  CL 106   < > 114*  CO2 24   < > 27  GLUCOSE 192*   < > 114*  BUN 25*   < > 45*  CREATININE 0.92   < > 0.89  CALCIUM 9.1   < > 8.5*  MG  --    < > 2.5*  AST 56*  --   --   ALT 29  --   --   ALKPHOS 147*  --   --   BILITOT 2.2*  --   --    < > = values in this interval not displayed.   ------------------------------------------------------------------------------------------------------------------  Cardiac Enzymes Recent Labs  Lab 08/15/2017 1857  TROPONINI 0.03*   ------------------------------------------------------------------------------------------------------------------  RADIOLOGY:  No results found.  ASSESSMENT AND PLAN:  1.Acute sepsis  Due to HCAP Resolved Treated on oursepsisprotocol,IV Zosyn  2. Acute hypoxic and hypercarbic respiratory failureandacidosis No improvement ContinueBiPAPw/ weaning as tolerated,s/pthoracentesis w/ 1.2 Ltaken off  DNR noted In discussion with intensivist-plans are for her to  discuss with patient's family regarding comfort care/hospice going forward  3.Acute fecal impaction Resolved CT noted for large stool burden Continue bowel regiment  4.Acute left pleural effusion S/pU/S guided thoracentesiswith 1.2 L taken off3/4  5. Chronic  GERDw/esophagitis  Stable  PPI daily  6. HxCAD Stable on current regiment  7. Hx Afib Stable  8. Hxof colon cancer  Stable  9. Hx of prostate cancer Stable  10. Hypothyroidism, chronic Stable Continuelevothyroxine  All the records are reviewed and case discussed with Care Management/Social Workerr. Management plans discussed with the patient, family and they are in agreement.  CODE STATUS: dnr  TOTAL TIME TAKING CARE OF THIS PATIENT: 35 minutes.   POSSIBLE D/C IN 1-5 DAYS, DEPENDING ON CLINICAL CONDITION.   Avel Peace [name] M.D on 08-20-17   Between 7am to 6pm - Pager - [phone]  After 6pm go to [email or web address] - password [email or web address] [facility]  Office  [phone]  CC: Primary care physician; Rusty Aus, MD  Note: This dictation was prepared with Dragon dictation along with smaller phrase technology. Any transcriptional errors that result from this process are unintentional.

## 2017-08-20 NOTE — Progress Notes (Signed)
RN notified Dr Alva Garnet regarding patient's family okay with going ahead with comfort care.  Needing comfort care order placed and okay to start morphine gtt.

## 2017-08-20 NOTE — Progress Notes (Signed)
   08/20/2017 1550  Clinical Encounter Type  Visited With Family;Patient not available  Visit Type Follow-up;Patient actively dying  Referral From Nurse  Consult/Referral To Chaplain  Spiritual Encounters  Spiritual Needs Prayer;Emotional;Grief support   CH received an OR that PT was at end of life. [place] reported to RM as PT was being transferred to 1C from CCU. [place] meet family in [place]. Offered silent prayer and ministry of presences. PT expired.

## 2017-08-20 NOTE — Progress Notes (Signed)
Patient now comfort care, morphine gtt started for patient comfort.  Room assignment received to transfer out to room 102. Awaiting receiving RN, Deidra to call back for report.

## 2017-08-20 DEATH — deceased
